# Patient Record
Sex: Female | Born: 1960 | Race: Black or African American | Hispanic: No | Marital: Married | Smoking: Never smoker
Health system: Southern US, Community
[De-identification: ages and names within clinical notes are randomized; demographics above are authoritative.]

## PROBLEM LIST (undated history)

## (undated) DIAGNOSIS — J40 Bronchitis, not specified as acute or chronic: Secondary | ICD-10-CM

## (undated) DIAGNOSIS — I10 Essential (primary) hypertension: Secondary | ICD-10-CM

## (undated) DIAGNOSIS — J449 Chronic obstructive pulmonary disease, unspecified: Secondary | ICD-10-CM

## (undated) NOTE — *Deleted (*Deleted)
Keeping You Healthy  Get These Tests  Blood Pressure- Have your blood pressure checked by your healthcare provider at least once a year.  Normal blood pressure is 120/80.  Weight- Have your body mass index (BMI) calculated to screen for obesity.  BMI is a measure of body fat based on height and weight.  You can calculate your own BMI at www.nhlbisupport.com/bmi/  Cholesterol- Have your cholesterol checked every year.  Diabetes- Have your blood sugar checked every year if you have high blood pressure, high cholesterol, a family history of diabetes or if you are overweight.  Pap Test - Have a pap test every 1 to 5 years if you have been sexually active.  If you are older than 65 and recent pap tests have been normal you may not need additional pap tests.  In addition, if you have had a hysterectomy  for benign disease additional pap tests are not necessary.  Mammogram-Yearly mammograms are essential for early detection of breast cancer  Screening for Colon Cancer- Colonoscopy starting at age 50. Screening may begin sooner depending on your family history and other health conditions.  Follow up colonoscopy as directed by your Gastroenterologist.  Screening for Osteoporosis- Screening begins at age 65 with bone density scanning, sooner if you are at higher risk for developing Osteoporosis.  Get these medicines  Calcium with Vitamin D- Your body requires 1200-1500 mg of Calcium a day and 800-1000 IU of Vitamin D a day.  You can only absorb 500 mg of Calcium at a time therefore Calcium must be taken in 2 or 3 separate doses throughout the day.  Hormones- Hormone therapy has been associated with increased risk for certain cancers and heart disease.  Talk to your healthcare provider about if you need relief from menopausal symptoms.  Aspirin- Ask your healthcare provider about taking Aspirin to prevent Heart Disease and Stroke.  Get these Immuniztions  Flu shot- Every fall  Pneumonia shot-  Once after the age of 65; if you are younger ask your healthcare provider if you need a pneumonia shot.  Tetanus- Every ten years.  Zostavax- Once after the age of 60 to prevent shingles.  Take these steps  Don't smoke- Your healthcare provider can help you quit. For tips on how to quit, ask your healthcare provider or go to www.smokefree.gov or call 1-800 QUIT-NOW.  Be physically active- Exercise 5 days a week for a minimum of 30 minutes.  If you are not already physically active, start slow and gradually work up to 30 minutes of moderate physical activity.  Try walking, dancing, bike riding, swimming, etc.  Eat a healthy diet- Eat a variety of healthy foods such as fruits, vegetables, whole grains, low fat milk, low fat cheeses, yogurt, lean meats, chicken, fish, eggs, dried beans, tofu, etc.  For more information go to www.thenutritionsource.org  Dental visit- Brush and floss teeth twice daily; visit your dentist twice a year.  Eye exam- Visit your Optometrist or Ophthalmologist yearly.  Drink alcohol in moderation- Limit alcohol intake to one drink or less a day.  Never drink and drive.  Depression- Your emotional health is as important as your physical health.  If you're feeling down or losing interest in things you normally enjoy, please talk to your healthcare provider.  Seat Belts- can save your life; always wear one  Smoke/Carbon Monoxide detectors- These detectors need to be installed on the appropriate level of your home.  Replace batteries at least once a year.  Violence- If   anyone is threatening or hurting you, please tell your healthcare provider.  Living Will/ Health care power of attorney- Discuss with your healthcare provider and family. 

---

## 1995-10-23 ENCOUNTER — Other Ambulatory Visit: Payer: Self-pay

## 2001-04-16 ENCOUNTER — Emergency Department (HOSPITAL_COMMUNITY): Admission: EM | Admit: 2001-04-16 | Discharge: 2001-04-16 | Payer: Self-pay | Admitting: Emergency Medicine

## 2002-06-04 ENCOUNTER — Emergency Department (HOSPITAL_COMMUNITY): Admission: EM | Admit: 2002-06-04 | Discharge: 2002-06-04 | Payer: Self-pay | Admitting: Emergency Medicine

## 2005-01-08 ENCOUNTER — Emergency Department (HOSPITAL_COMMUNITY): Admission: EM | Admit: 2005-01-08 | Discharge: 2005-01-08 | Payer: Self-pay | Admitting: Emergency Medicine

## 2007-02-17 ENCOUNTER — Emergency Department (HOSPITAL_COMMUNITY): Admission: EM | Admit: 2007-02-17 | Discharge: 2007-02-17 | Payer: Self-pay | Admitting: Family Medicine

## 2007-04-24 ENCOUNTER — Emergency Department (HOSPITAL_COMMUNITY): Admission: EM | Admit: 2007-04-24 | Discharge: 2007-04-24 | Payer: Self-pay | Admitting: Emergency Medicine

## 2010-12-14 LAB — URINALYSIS, ROUTINE W REFLEX MICROSCOPIC
Bilirubin Urine: NEGATIVE
Glucose, UA: NEGATIVE
Hgb urine dipstick: NEGATIVE
Ketones, ur: NEGATIVE
Nitrite: NEGATIVE
Protein, ur: NEGATIVE
Specific Gravity, Urine: 1.004 — ABNORMAL LOW
Urobilinogen, UA: 0.2
pH: 7

## 2010-12-14 LAB — BASIC METABOLIC PANEL
BUN: <1 — ABNORMAL LOW
CO2: 28
Calcium: 10.1
Chloride: 103
Creatinine, Ser: 0.48
GFR calc Af Amer: >60
GFR calc non Af Amer: >60
Glucose, Bld: 110 — ABNORMAL HIGH
Potassium: 3.1 — ABNORMAL LOW
Sodium: 138

## 2010-12-14 LAB — DIFFERENTIAL
Band Neutrophils: 0
Basophils Relative: 0
Blasts: 0
Eosinophils Relative: 2
Lymphocytes Relative: 37
Metamyelocytes Relative: 1
Monocytes Relative: 0 — ABNORMAL LOW
Myelocytes: 0
Neutrophils Relative %: 60
Promyelocytes Absolute: 0
nRBC: 0

## 2010-12-14 LAB — CBC
HCT: 32.5 — ABNORMAL LOW
Hemoglobin: 10.4 — ABNORMAL LOW
MCHC: 32
MCV: 56.6 — ABNORMAL LOW
Platelets: 187
RBC: 5.74 — ABNORMAL HIGH
RDW: 24.9 — ABNORMAL HIGH
WBC: 6.9

## 2011-01-02 LAB — POCT URINALYSIS DIP (DEVICE)
Bilirubin Urine: NEGATIVE
Glucose, UA: NEGATIVE
Hgb urine dipstick: NEGATIVE
Ketones, ur: NEGATIVE
Nitrite: POSITIVE — AB
Operator id: 235561
Protein, ur: NEGATIVE
Specific Gravity, Urine: 1.015
Urobilinogen, UA: 0.2
pH: 6.5

## 2011-01-02 LAB — POCT PREGNANCY, URINE
Operator id: 235561
Preg Test, Ur: NEGATIVE

## 2016-07-23 ENCOUNTER — Other Ambulatory Visit: Payer: Self-pay

## 2018-11-29 ENCOUNTER — Emergency Department (HOSPITAL_COMMUNITY)
Admission: EM | Admit: 2018-11-29 | Discharge: 2018-11-29 | Disposition: A | Discharge status: Home / Self Care | Payer: Self-pay | Attending: Emergency Medicine | Admitting: Emergency Medicine

## 2018-11-29 ENCOUNTER — Other Ambulatory Visit: Payer: Self-pay

## 2018-11-29 ENCOUNTER — Encounter (HOSPITAL_COMMUNITY): Payer: Self-pay | Admitting: *Deleted

## 2018-11-29 ENCOUNTER — Emergency Department (HOSPITAL_COMMUNITY): Payer: Self-pay

## 2018-11-29 ENCOUNTER — Emergency Department (HOSPITAL_COMMUNITY): Admission: EM | Admit: 2018-11-29 | Discharge: 2018-11-29 | Discharge status: Absent without leave | Payer: Self-pay

## 2018-11-29 DIAGNOSIS — R059 Cough, unspecified: Secondary | ICD-10-CM

## 2018-11-29 DIAGNOSIS — I10 Essential (primary) hypertension: Secondary | ICD-10-CM | POA: Insufficient documentation

## 2018-11-29 DIAGNOSIS — R05 Cough: Secondary | ICD-10-CM | POA: Insufficient documentation

## 2018-11-29 DIAGNOSIS — Z79899 Other long term (current) drug therapy: Secondary | ICD-10-CM | POA: Insufficient documentation

## 2018-11-29 LAB — BASIC METABOLIC PANEL
Anion gap: 11 (ref 5–15)
BUN: 10 mg/dL (ref 6–20)
CO2: 26 mmol/L (ref 22–32)
Calcium: 10.5 mg/dL — ABNORMAL HIGH (ref 8.9–10.3)
Chloride: 103 mmol/L (ref 98–111)
Creatinine, Ser: 1.01 mg/dL — ABNORMAL HIGH (ref 0.44–1.00)
GFR calc Af Amer: >60 mL/min (ref >60)
GFR calc non Af Amer: >60 mL/min (ref >60)
Glucose, Bld: 105 mg/dL — ABNORMAL HIGH (ref 70–99)
Potassium: 4 mmol/L (ref 3.5–5.1)
Sodium: 140 mmol/L (ref 135–145)

## 2018-11-29 LAB — CBC WITH DIFFERENTIAL/PLATELET
Abs Immature Granulocytes: 0.02 10*3/uL (ref 0.00–0.07)
Basophils Absolute: 0 10*3/uL (ref 0.0–0.1)
Basophils Relative: 0 %
Eosinophils Absolute: 1.1 10*3/uL — ABNORMAL HIGH (ref 0.0–0.5)
Eosinophils Relative: 19 %
HCT: 37.3 % (ref 36.0–46.0)
Hemoglobin: 11.7 g/dL — ABNORMAL LOW (ref 12.0–15.0)
Immature Granulocytes: 0 %
Lymphocytes Relative: 22 %
Lymphs Abs: 1.3 10*3/uL (ref 0.7–4.0)
MCH: 20.7 pg — ABNORMAL LOW (ref 26.0–34.0)
MCHC: 31.4 g/dL (ref 30.0–36.0)
MCV: 66 fL — ABNORMAL LOW (ref 80.0–100.0)
Monocytes Absolute: 0.3 10*3/uL (ref 0.1–1.0)
Monocytes Relative: 5 %
Neutro Abs: 3.1 10*3/uL (ref 1.7–7.7)
Neutrophils Relative %: 54 %
Platelets: 183 10*3/uL (ref 150–400)
RBC: 5.65 MIL/uL — ABNORMAL HIGH (ref 3.87–5.11)
RDW: 20.3 % — ABNORMAL HIGH (ref 11.5–15.5)
WBC: 5.8 10*3/uL (ref 4.0–10.5)
nRBC: 0 % (ref 0.0–0.2)

## 2018-11-29 LAB — TROPONIN I (HIGH SENSITIVITY): Troponin I (High Sensitivity): 2 ng/L (ref <18)

## 2018-11-29 LAB — LACTIC ACID, PLASMA: Lactic Acid, Venous: 1.2 mmol/L (ref 0.5–1.9)

## 2018-11-29 MED ORDER — LABETALOL HCL 5 MG/ML IV SOLN
20.0000 mg | Freq: Once | INTRAVENOUS | Status: AC
  Administered 2018-11-29: 20 mg via INTRAVENOUS
  Filled 2018-11-29: qty 4

## 2018-11-29 MED ORDER — HYDRALAZINE HCL 20 MG/ML IJ SOLN: 10.0000 mg | Freq: Once | INTRAMUSCULAR | Status: DC

## 2018-11-29 NOTE — ED Notes (Signed)
Attempted x2 to obtain EKG, unsuccessful. Asked 2 other staff members to assist, Precious NT said she would attempt

## 2018-11-29 NOTE — ED Notes (Signed)
An After Visit Summary was printed and given to the patient. Discharge instructions given and no further questions at this time.  

## 2018-11-29 NOTE — ED Triage Notes (Signed)
Pt reports productive cough for the past 2 months. Pt has not been seen for this. Pt denies fever or shortness of breath.

## 2018-11-29 NOTE — Discharge Instructions (Signed)
Follow-up with [facility] for your primary care needs.

## 2018-11-29 NOTE — ED Provider Notes (Signed)
[name] Provider Note   CSN: [id number] Arrival date & time: 11/29/18  0844     History   Chief Complaint Chief Complaint  Patient presents with  . Cough    HPI Nicole Cunningham is a 58 y.o. female who presents to the ED planing of persistent productive cough for the past 2 months.  Patient reports that she saw a provider sometime in July and was prescribed antibiotics as well as an albuterol inhaler without relief.  She reports she has been taking OTC cough medication since then without relief.  She presents to the ED today due to "I am tired of coughing."  Patient had not followed up with that provider after being seen in July.  No known sick contacts including no known COVID-19 positive exposures.  Denies fever, chills, shortness of breath, chest pain, leg swelling, any other associated symptoms.      History reviewed. No pertinent past medical history.  There are no active problems to display for this patient.   History reviewed. No pertinent surgical history.   OB History   No obstetric history on file.      Home Medications    Prior to Admission medications   Medication Sig Start Date End Date Taking? Authorizing Provider  gabapentin (NEURONTIN) 300 MG capsule Take 300 mg by mouth at bedtime.   Yes [provider]  mirtazapine (REMERON) 30 MG tablet Take 30 mg by mouth at bedtime.   Yes [provider]  QUEtiapine (SEROQUEL) 300 MG tablet Take 300 mg by mouth at bedtime.   Yes [provider]    Family History No family history on file.  Social History Social History   Tobacco Use  . Smoking status: Never Smoker  . Smokeless tobacco: Never Used  Substance Use Topics  . Alcohol use: Not Currently  . Drug use: Not on file     Allergies   Patient has no known allergies.   Review of Systems Review of Systems  Constitutional: Negative for chills and fever.  HENT: Negative for congestion,  ear pain and sore throat.   Eyes: Negative for visual disturbance.  Respiratory: Positive for cough. Negative for shortness of breath.   Cardiovascular: Negative for chest pain.  Gastrointestinal: Negative for abdominal pain.  Genitourinary: Negative for difficulty urinating.  Musculoskeletal: Negative for myalgias.  Skin: Negative for rash.  Neurological: Negative for headaches.     Physical Exam Updated Vital Signs BP (!) 184/122 (BP Location: Right Arm)   Pulse (!) 117   Temp (!) 97.5 F (36.4 C) (Oral)   Resp 18   SpO2 99%   Physical Exam Vitals signs and nursing note reviewed.  Constitutional:      Appearance: She is not ill-appearing.  HENT:     Head: Normocephalic and atraumatic.  Eyes:     Conjunctiva/sclera: Conjunctivae normal.  Neck:     Musculoskeletal: Neck supple.  Cardiovascular:     Rate and Rhythm: Normal rate and regular rhythm.     Pulses: Normal pulses.  Pulmonary:     Effort: Pulmonary effort is normal.     Breath sounds: Normal breath sounds. No wheezing, rhonchi or rales.     Comments: Actively coughing in room.  Able to speak in full sentences.  No accessory muscle use.  Satting 99% on room air.  Chest:     Chest wall: No tenderness.  Abdominal:     Palpations: Abdomen is soft.  Tenderness: There is no abdominal tenderness. There is no guarding or rebound.  Musculoskeletal:     Right lower leg: No edema.     Left lower leg: No edema.  Skin:    General: Skin is warm and dry.  Neurological:     Mental Status: She is alert.      ED Treatments / Results  Labs (all labs ordered are listed, but only abnormal results are displayed) Labs Reviewed  BASIC METABOLIC PANEL - Abnormal; Notable for the following components:      Result Value   Glucose, Bld 105 (*)    Creatinine, Ser 1.01 (*)    Calcium 10.5 (*)    All other components within normal limits  CBC WITH DIFFERENTIAL/PLATELET - Abnormal; Notable for the following components:    RBC 5.65 (*)    Hemoglobin 11.7 (*)    MCV 66.0 (*)    MCH 20.7 (*)    RDW 20.3 (*)    Eosinophils Absolute 1.1 (*)    All other components within normal limits  LACTIC ACID, PLASMA  TROPONIN I (HIGH SENSITIVITY)    EKG None  Radiology Dg Chest 2 View  Result Date: 11/29/2018 CLINICAL DATA:  Productive cough for 2 months.  Shortness of breath. EXAM: CHEST - 2 VIEW COMPARISON:  None. FINDINGS: The heart size and mediastinal contours are within normal limits. Both lungs are clear. The visualized skeletal structures are unremarkable. IMPRESSION: No active cardiopulmonary disease. Electronically Signed   By: Danae OrleansJohn A Stahl M.D.   On: 11/29/2018 10:09    Procedures Procedures (including critical care time)  Medications Ordered in ED Medications  labetalol (NORMODYNE) injection 20 mg (20 mg Intravenous Given 11/29/18 1024)     Initial Impression / Assessment and Plan / ED Course  I have reviewed the triage vital signs and the nursing notes.  Pertinent labs & imaging results that were available during my care of the patient were reviewed by me and considered in my medical decision making (see chart for details).    58 year old female who presents the ED complaining of a persistent cough for the past 2 months, unchanged.  Is already had antibiotics although patient cannot tell me the name of these antibiotics as well as an albuterol inhaler without relief.  No other symptoms including rhinorrhea, sore throat, postnasal drainage, ear pain, shortness of breath, chest pain.  Patient is tachycardic on arrival at 117.  She is also hypertensive at 184/122.  It appears the patient does not go to a doctor very frequently.  No history of hypertension.  Will obtain baseline screening labs as well as lactic acid today and chest x-ray.  She without any complaints of chest pain or shortness of breath.  Doubt PE today.  Will recheck blood pressure.  Patient may need to be started on antihypertensive.    Blood pressure recheck of 181/130 with tachycardia. Will give Labetalol in the ED today and check EKG and troponin. Dr. Stevie Kernykstra attending physician evaluated patient as well; agrees with plan.   EKG without ishcemic changes. Troponin 2. Do not feel pt needs repeat troponin level today. Her tachycardia has improved with the medication. CXR clear today. Remainder of bloodwork unremarkable. Will discharge patient home at this time with PCP follow up to manage bloodpressure/work up cough further. [facility]. Strict return precautions discussed. Pt is in agreement with plan and stable for discharge home.   This note was prepared using Conservation officer, historic buildingsDragon voice recognition software  and may include unintentional dictation errors due to the inherent limitations of voice recognition software.       Final Clinical Impressions(s) / ED Diagnoses   Final diagnoses:  Cough  Essential hypertension    ED Discharge Orders    None       Tanda Rockers, PA-C 11/29/18 1417    Milagros Loll, MD 11/30/18 [phone]

## 2019-01-28 ENCOUNTER — Encounter (HOSPITAL_COMMUNITY): Payer: Self-pay | Admitting: *Deleted

## 2019-01-28 ENCOUNTER — Other Ambulatory Visit: Payer: Self-pay

## 2019-01-28 ENCOUNTER — Emergency Department (HOSPITAL_COMMUNITY): Payer: Self-pay

## 2019-01-28 ENCOUNTER — Emergency Department (HOSPITAL_COMMUNITY)
Admission: EM | Admit: 2019-01-28 | Discharge: 2019-01-28 | Disposition: A | Discharge status: Home / Self Care | Payer: Self-pay | Attending: Emergency Medicine | Admitting: Emergency Medicine

## 2019-01-28 DIAGNOSIS — R0602 Shortness of breath: Secondary | ICD-10-CM | POA: Insufficient documentation

## 2019-01-28 DIAGNOSIS — J40 Bronchitis, not specified as acute or chronic: Secondary | ICD-10-CM | POA: Insufficient documentation

## 2019-01-28 DIAGNOSIS — Z79899 Other long term (current) drug therapy: Secondary | ICD-10-CM | POA: Insufficient documentation

## 2019-01-28 MED ORDER — ALBUTEROL SULFATE HFA 108 (90 BASE) MCG/ACT IN AERS
2.0000 | INHALATION_SPRAY | Freq: Once | RESPIRATORY_TRACT | Status: AC
  Administered 2019-01-28: 2 via RESPIRATORY_TRACT
  Filled 2019-01-28: qty 6.7

## 2019-01-28 MED ORDER — PREDNISONE 10 MG PO TABS: ORAL_TABLET | ORAL | 0 refills | Status: DC

## 2019-01-28 MED ORDER — PREDNISONE 20 MG PO TABS
60.0000 mg | ORAL_TABLET | Freq: Every day | ORAL | Status: DC
  Administered 2019-01-28: 60 mg via ORAL
  Filled 2019-01-28: qty 3

## 2019-01-28 NOTE — ED Provider Notes (Signed)
[facility] DEPT Provider Note   CSN: [id number] Arrival date & time: 01/28/19  1227     History   Chief Complaint Chief Complaint  Patient presents with  . Cough  . Shortness of Breath    HPI Nicole Cunningham is a 58 y.o. female.     The history is provided by the patient.  Cough Cough characteristics:  Non-productive Sputum characteristics:  Nondescript Severity:  Moderate Onset quality:  Gradual Duration:  3 days Timing:  Constant Progression:  Worsening Chronicity:  New Relieved by:  Nothing Worsened by:  Nothing Ineffective treatments:  None tried Associated symptoms: shortness of breath   Shortness of Breath Associated symptoms: cough     History reviewed. No pertinent past medical history.  There are no active problems to display for this patient.   History reviewed. No pertinent surgical history.   OB History   No obstetric history on file.      Home Medications    Prior to Admission medications   Medication Sig Start Date End Date Taking? Authorizing Provider  gabapentin (NEURONTIN) 300 MG capsule Take 300 mg by mouth at bedtime.    [provider]  mirtazapine (REMERON) 30 MG tablet Take 30 mg by mouth at bedtime.    [provider]  predniSONE (DELTASONE) 10 MG tablet 5,4,3,2,1 taper 01/28/19   Caryl Ada K, PA-C  QUEtiapine (SEROQUEL) 300 MG tablet Take 300 mg by mouth at bedtime.    [provider]    Family History No family history on file.  Social History Social History   Tobacco Use  . Smoking status: Never Smoker  . Smokeless tobacco: Never Used  Substance Use Topics  . Alcohol use: Not Currently  . Drug use: Never     Allergies   Patient has no known allergies.   Review of Systems Review of Systems  Respiratory: Positive for cough and shortness of breath.   All other systems reviewed and are negative.    Physical Exam Updated Vital Signs BP (!) 141/70    Pulse 88   Temp 98.3 F (36.8 C) (Oral)   Resp 18   Ht 5\' 4"  (1.626 m)   SpO2 99%   Physical Exam Vitals signs and nursing note reviewed.  Constitutional:      Appearance: She is well-developed.  HENT:     Head: Normocephalic.     Mouth/Throat:     Mouth: Mucous membranes are moist.  Neck:     Musculoskeletal: Normal range of motion.  Cardiovascular:     Rate and Rhythm: Normal rate and regular rhythm.  Pulmonary:     Effort: Pulmonary effort is normal.     Breath sounds: No decreased breath sounds.  Abdominal:     General: There is no distension.  Musculoskeletal: Normal range of motion.  Skin:    General: Skin is warm.  Neurological:     General: No focal deficit present.     Mental Status: She is alert and oriented to person, place, and time.  Psychiatric:        Mood and Affect: Mood normal.      ED Treatments / Results  Labs (all labs ordered are listed, but only abnormal results are displayed) Labs Reviewed - No data to display  EKG None  Radiology Dg Chest [facility] 1 View  Result Date: 01/28/2019 CLINICAL DATA:  Cough EXAM: PORTABLE CHEST 1 VIEW COMPARISON:  None. FINDINGS: The heart size and mediastinal contours are  within normal limits. Both lungs are clear. The visualized skeletal structures are unremarkable. Mild scarring at the right apex. IMPRESSION: No active disease. Electronically Signed   By: Jasmine Pang M.D.   On: 01/28/2019 18:46    Procedures Procedures (including critical care time)  Medications Ordered in ED Medications  predniSONE (DELTASONE) tablet 60 mg (60 mg Oral Given 01/28/19 1857)  albuterol (VENTOLIN HFA) 108 (90 Base) MCG/ACT inhaler 2 puff (2 puffs Inhalation Given 01/28/19 1624)     Initial Impression / Assessment and Plan / ED Course  I have reviewed the triage vital signs and the nursing notes.  Pertinent labs & imaging results that were available during my care of the patient were reviewed by me and considered in my  medical decision making (see chart for details).        MDM  Chest xray no acute abnormality   Pt has no covid risk currently.  Pt reports she has had multiple episodes of bronchitis in the past.  Pt given inhaler here.  Prednisone and rx.   Final Clinical Impressions(s) / ED Diagnoses   Final diagnoses:  Bronchitis    ED Discharge Orders         Ordered    predniSONE (DELTASONE) 10 MG tablet     01/28/19 1852        An After Visit Summary was printed and given to the patient.    Osie Cheeks 01/28/19 2115    Milagros Loll, MD 01/29/19 0001

## 2019-01-28 NOTE — Discharge Instructions (Addendum)
Return if any problems.

## 2019-01-28 NOTE — ED Notes (Addendum)
An After Visit Summary was printed and given to the patient. Discharge instructions given and no further questions at this time. Pt denies SOB at this time. Pt states family is taking her home.

## 2019-01-28 NOTE — ED Triage Notes (Signed)
Pt reports SOB and coughing for a few months.  Pt states that she has been seen for similar issue. Pt reports that when she coughs, she feels SOB. Pt is coughing up "cream colored" mucus. Pt states she had a negative COVID test the first time she was seen for her symptoms. Pt states she has not been around anyone since getting her COVID test.

## 2019-02-17 ENCOUNTER — Ambulatory Visit (HOSPITAL_COMMUNITY)
Admission: EM | Admit: 2019-02-17 | Discharge: 2019-02-17 | Disposition: A | Discharge status: Home / Self Care | Payer: Self-pay | Attending: Family Medicine | Admitting: Family Medicine

## 2019-02-17 ENCOUNTER — Encounter (HOSPITAL_COMMUNITY): Payer: Self-pay

## 2019-02-17 ENCOUNTER — Other Ambulatory Visit: Payer: Self-pay

## 2019-02-17 DIAGNOSIS — J22 Unspecified acute lower respiratory infection: Secondary | ICD-10-CM

## 2019-02-17 HISTORY — DX: Bronchitis, not specified as acute or chronic: J40

## 2019-02-17 HISTORY — DX: Essential (primary) hypertension: I10

## 2019-02-17 MED ORDER — PREDNISONE 20 MG PO TABS: 20.0000 mg | ORAL_TABLET | Freq: Two times a day (BID) | ORAL | 0 refills | Status: DC

## 2019-02-17 MED ORDER — BENZONATATE 200 MG PO CAPS: 200.0000 mg | ORAL_CAPSULE | Freq: Two times a day (BID) | ORAL | 0 refills | Status: DC | PRN

## 2019-02-17 MED ORDER — AZITHROMYCIN 250 MG PO TABS: ORAL_TABLET | ORAL | 0 refills | Status: DC

## 2019-02-17 NOTE — Discharge Instructions (Signed)
REST PUSH FLUIDS RUN A HUMIDIFIER IN YOUR ROOM IF YOU HAVE ONE TAKE MEDICATIONS AS PRESCRIBED SEE YOUR PCP IF NOT BETTER IN A WEEK OR TWO

## 2019-02-17 NOTE — ED Triage Notes (Signed)
Pt states she has bronchitis. Pt states she was told that at the ER.on 01/28/2019. Pt states she has taken all of her meds and she still has a cough. X 1 week now.Pt states she was tested for Covid. Pt results were negative.

## 2019-02-17 NOTE — ED Provider Notes (Signed)
[facility]    CSN: [id number] Arrival date & time: 02/17/19  4098      History   Chief Complaint Chief Complaint  Patient presents with  . Cough    HPI Nicole Cunningham is a 58 y.o. female.   HPI  Patient is here for follow-up for bronchitis.  She was seen On 01/28/2019 in the emergency department.  Diagnosed with acute bronchitis.  Treated with steroids and an albuterol inhaler. Patient has no underlying lung disease asthma or COPD. Patient has never been a smoker. She had coronavirus testing which was negative. She states that she is continuing to cough.  She is coughing up a lot of sputum.  She also has a lot of "mucus in my nose".  The sputum is turning yellow.  Her chest is "tired" from all the coughing.  No shortness of breath.  No body aches.  No loss of appetite, taste, or smell.  No exposure to illness.  No prior history of comes with infection such as bronchitis.  No notes in the household is sick.  Patient works from home  Past Medical History:  Diagnosis Date  . Bronchitis   . Hypertension     There are no active problems to display for this patient.   History reviewed. No pertinent surgical history.  OB History   No obstetric history on file.      Home Medications    Prior to Admission medications   Medication Sig Start Date End Date Taking? Authorizing Provider  azithromycin (ZITHROMAX Z-PAK) 250 MG tablet Take two pills today followed by one a day until gone 02/17/19   Eustace Moore, MD  benzonatate (TESSALON) 200 MG capsule Take 1 capsule (200 mg total) by mouth 2 (two) times daily as needed for cough. 02/17/19   Eustace Moore, MD  gabapentin (NEURONTIN) 300 MG capsule Take 300 mg by mouth at bedtime.    [provider]  mirtazapine (REMERON) 30 MG tablet Take 30 mg by mouth at bedtime.    [provider]  predniSONE (DELTASONE) 20 MG tablet Take 1 tablet (20 mg total) by mouth 2 (two) times daily with a meal.  02/17/19   Eustace Moore, MD  QUEtiapine (SEROQUEL) 300 MG tablet Take 300 mg by mouth at bedtime.    [provider]    Family History Family History  Problem Relation Age of Onset  . Diabetes Mother   . Diabetes Father     Social History Social History   Tobacco Use  . Smoking status: Never Smoker  . Smokeless tobacco: Never Used  Substance Use Topics  . Alcohol use: Not Currently  . Drug use: Never     Allergies   Patient has no known allergies.   Review of Systems Review of Systems  Constitutional: Positive for fatigue. Negative for chills and fever.  HENT: Positive for congestion, postnasal drip, rhinorrhea and sinus pressure. Negative for ear pain and sore throat.   Eyes: Negative for pain and visual disturbance.  Respiratory: Positive for cough. Negative for shortness of breath.   Cardiovascular: Negative for chest pain and palpitations.  Gastrointestinal: Negative for abdominal pain and vomiting.  Genitourinary: Negative for dysuria and hematuria.  Musculoskeletal: Negative for arthralgias and back pain.  Skin: Negative for color change and rash.  Neurological: Negative for seizures and syncope.  All other systems reviewed and are negative.    Physical Exam Triage Vital Signs ED Triage Vitals  Enc Vitals Group  BP 02/17/19 0959 124/82     Pulse Rate 02/17/19 0959 100     Resp 02/17/19 0959 16     Temp 02/17/19 0959 98.4 F (36.9 C)     Temp Source 02/17/19 0959 Oral     SpO2 02/17/19 0959 96     Weight 02/17/19 1001 140 lb (63.5 kg)     Height --      Head Circumference --      Peak Flow --      Pain Score 02/17/19 1000 2     Pain Loc --      Pain Edu? --      Excl. in GC? --    No data found.  Updated Vital Signs BP 124/82 (BP Location: Right Arm)   Pulse 100   Temp 98.4 F (36.9 C) (Oral)   Resp 16   Wt 63.5 kg   SpO2 (!) 16%   BMI 24.03 kg/m     Physical Exam Constitutional:      General: She is not in acute  distress.    Appearance: She is well-developed and normal weight.  HENT:     Head: Normocephalic and atraumatic.     Nose: Congestion and rhinorrhea present.     Mouth/Throat:     Mouth: Mucous membranes are moist.     Pharynx: No posterior oropharyngeal erythema.  Eyes:     Conjunctiva/sclera: Conjunctivae normal.     Pupils: Pupils are equal, round, and reactive to light.  Neck:     Musculoskeletal: Normal range of motion.  Cardiovascular:     Rate and Rhythm: Normal rate and regular rhythm.  Pulmonary:     Effort: Pulmonary effort is normal. No respiratory distress.     Breath sounds: Rhonchi present. No wheezing or rales.  Abdominal:     General: There is no distension.     Palpations: Abdomen is soft.  Musculoskeletal: Normal range of motion.  Skin:    General: Skin is warm and dry.  Neurological:     Mental Status: She is alert.  Psychiatric:        Mood and Affect: Mood normal.        Behavior: Behavior normal.      UC Treatments / Results  Labs (all labs ordered are listed, but only abnormal results are displayed) Labs Reviewed - No data to display  EKG   Radiology No results found.  Procedures Procedures (including critical care time)  Medications Ordered in UC Medications - No data to display  Initial Impression / Assessment and Plan / UC Course  I have reviewed the triage vital signs and the nursing notes.  Pertinent labs & imaging results that were available during my care of the patient were reviewed by me and considered in my medical decision making (see chart for details).     I discussed with the patient that acute bronchitis is almost always caused by a virus.  The persistent cough can be from viral inflammation.  However, with the change in symptoms and increased productive sputum, I feel like a Z-Pak is reasonable.  We will repeat her steroids.  She already has an inhaler.  I am giving her Tessalon for the cough.  She needs to push fluids  and use humidifier if she has 1.  Follow-up with the PCP as recommended Final Clinical Impressions(s) / UC Diagnoses   Final diagnoses:  Lower respiratory tract infection     Discharge Instructions     REST  PUSH FLUIDS RUN A HUMIDIFIER IN YOUR ROOM IF YOU HAVE ONE TAKE MEDICATIONS AS PRESCRIBED SEE YOUR PCP IF NOT BETTER IN A WEEK OR TWO    ED Prescriptions    Medication Sig Dispense Auth. Provider   benzonatate (TESSALON) 200 MG capsule Take 1 capsule (200 mg total) by mouth 2 (two) times daily as needed for cough. 20 capsule Raylene Everts, MD   predniSONE (DELTASONE) 20 MG tablet Take 1 tablet (20 mg total) by mouth 2 (two) times daily with a meal. 10 tablet Raylene Everts, MD   azithromycin (ZITHROMAX Z-PAK) 250 MG tablet Take two pills today followed by one a day until gone 6 tablet Meda Coffee Jennette Banker, MD     PDMP not reviewed this encounter.   Raylene Everts, MD 02/17/19 1057

## 2019-03-21 ENCOUNTER — Other Ambulatory Visit: Payer: Self-pay

## 2019-03-21 ENCOUNTER — Encounter (HOSPITAL_COMMUNITY): Payer: Self-pay | Admitting: *Deleted

## 2019-03-21 ENCOUNTER — Ambulatory Visit (INDEPENDENT_AMBULATORY_CARE_PROVIDER_SITE_OTHER): Payer: Self-pay

## 2019-03-21 ENCOUNTER — Ambulatory Visit (HOSPITAL_COMMUNITY)
Admission: EM | Admit: 2019-03-21 | Discharge: 2019-03-21 | Disposition: A | Discharge status: Home / Self Care | Payer: Self-pay

## 2019-03-21 DIAGNOSIS — R000 Tachycardia, unspecified: Secondary | ICD-10-CM

## 2019-03-21 DIAGNOSIS — R0602 Shortness of breath: Secondary | ICD-10-CM

## 2019-03-21 DIAGNOSIS — R059 Cough, unspecified: Secondary | ICD-10-CM

## 2019-03-21 DIAGNOSIS — R0789 Other chest pain: Secondary | ICD-10-CM

## 2019-03-21 DIAGNOSIS — J40 Bronchitis, not specified as acute or chronic: Secondary | ICD-10-CM

## 2019-03-21 DIAGNOSIS — R05 Cough: Secondary | ICD-10-CM

## 2019-03-21 LAB — POC SARS CORONAVIRUS 2 AG -  ED: SARS Coronavirus 2 Ag: NEGATIVE

## 2019-03-21 LAB — POC SARS CORONAVIRUS 2 AG: SARS Coronavirus 2 Ag: NEGATIVE

## 2019-03-21 MED ORDER — METHYLPREDNISOLONE SODIUM SUCC 125 MG IJ SOLR
125.0000 mg | Freq: Once | INTRAMUSCULAR | Status: AC
  Administered 2019-03-21: 125 mg via INTRAMUSCULAR

## 2019-03-21 MED ORDER — METHYLPREDNISOLONE SODIUM SUCC 125 MG IJ SOLR
INTRAMUSCULAR | Status: AC
  Filled 2019-03-21: qty 2

## 2019-03-21 MED ORDER — HYDROCODONE-HOMATROPINE 5-1.5 MG/5ML PO SYRP: 5.0000 mL | ORAL_SOLUTION | Freq: Every evening | ORAL | 0 refills | Status: DC | PRN

## 2019-03-21 MED ORDER — ALBUTEROL SULFATE HFA 108 (90 BASE) MCG/ACT IN AERS: 1.0000 | INHALATION_SPRAY | Freq: Four times a day (QID) | RESPIRATORY_TRACT | 0 refills | Status: DC | PRN

## 2019-03-21 NOTE — ED Provider Notes (Addendum)
[facility]   MRN: [id number] DOB: 1961-02-17  Subjective:   Nicole Cunningham is a 58 y.o. female presenting for 1 month hx of productive cough. Sx have persisted despite undergoing 2 rounds of steroids, azithromycin. She has ongoing productive hacking cough, coughing fits that elicit chest pain, shob and occasional post-tussive emesis. Has dyspnea. Reports that she gets bronchitis. Has a hx of HTN and reports compliance with these medications.  Denies smoking cigarettes, COPD.  No current facility-administered medications for this encounter.  Current Outpatient Medications:  .  benzonatate (TESSALON) 200 MG capsule, Take 1 capsule (200 mg total) by mouth 2 (two) times daily as needed for cough., Disp: 20 capsule, Rfl: 0 .  FLUoxetine HCl (PROZAC PO), Take by mouth., Disp: , Rfl:  .  gabapentin (NEURONTIN) 300 MG capsule, Take 300 mg by mouth at bedtime., Disp: , Rfl:  .  QUEtiapine (SEROQUEL) 300 MG tablet, Take 300 mg by mouth at bedtime., Disp: , Rfl:  .  UNKNOWN TO PATIENT, One HTN med; unk name, Disp: , Rfl:  .  azithromycin (ZITHROMAX Z-PAK) 250 MG tablet, Take two pills today followed by one a day until gone, Disp: 6 tablet, Rfl: 0 .  mirtazapine (REMERON) 30 MG tablet, Take 30 mg by mouth at bedtime., Disp: , Rfl:  .  predniSONE (DELTASONE) 20 MG tablet, Take 1 tablet (20 mg total) by mouth 2 (two) times daily with a meal., Disp: 10 tablet, Rfl: 0   No Known Allergies  Past Medical History:  Diagnosis Date  . Bronchitis   . Hypertension      History reviewed. No pertinent surgical history.  Family History  Problem Relation Age of Onset  . Diabetes Mother   . Diabetes Father     Social History   Tobacco Use  . Smoking status: Never Smoker  . Smokeless tobacco: Never Used  Substance Use Topics  . Alcohol use: Not Currently  . Drug use: Never    ROS   Objective:   Vitals: BP (!) 154/96 Comment: took HTN med approx 1 hr ago  Pulse (!) 112   Temp  97.9 F (36.6 C) (Oral)   Resp 18   SpO2 100%   Physical Exam Constitutional:      General: She is not in acute distress.    Appearance: Normal appearance. She is well-developed. She is not ill-appearing, toxic-appearing or diaphoretic.  HENT:     Head: Normocephalic and atraumatic.     Nose: Nose normal.     Mouth/Throat:     Mouth: Mucous membranes are moist.  Eyes:     Extraocular Movements: Extraocular movements intact.     Pupils: Pupils are equal, round, and reactive to light.  Cardiovascular:     Rate and Rhythm: Normal rate and regular rhythm.     Pulses: Normal pulses.     Heart sounds: Normal heart sounds. No murmur. No friction rub. No gallop.   Pulmonary:     Effort: Pulmonary effort is normal. No respiratory distress.     Breath sounds: Normal breath sounds. No stridor. No wheezing, rhonchi or rales.  Skin:    General: Skin is warm and dry.     Findings: No rash.  Neurological:     Mental Status: She is alert and oriented to person, place, and time.  Psychiatric:        Mood and Affect: Mood normal.        Behavior: Behavior normal.  Thought Content: Thought content normal.    ED ECG REPORT   Date: 03/21/2019  Rate: 98 bpm  Rhythm: Sinus rhythm  QRS Axis: normal  Intervals: normal  ST/T Wave abnormalities: normal  Conduction Disutrbances:none  Narrative Interpretation: Sinus rhythm at 98 bpm, largely unchanged from EKG from 12/01/2018  Old EKG Reviewed: unchanged  I have personally reviewed the EKG tracing and agree with the computerized printout as noted.   DG Chest 2 View  Result Date: 03/21/2019 CLINICAL DATA:  Cough and congestion for 1 month. EXAM: CHEST - 2 VIEW COMPARISON:  January 28, 2019 FINDINGS: The heart size and mediastinal contours are within normal limits. Biapical pleural thickening is identified unchanged. Both lungs are otherwise clear. The visualized skeletal structures are unremarkable. IMPRESSION: No active cardiopulmonary  disease. Electronically Signed   By: Abelardo Diesel M.D.   On: 03/21/2019 11:11   Pulse oximetry remained at 100% when challenged by walking in the clinic.  Pulse did increase to 125 and patient felt mild increase in shortness of breath.  Results for orders placed or performed during the hospital encounter of 03/21/19 (from the past 24 hour(s))  POC SARS Coronavirus 2 Ag-ED - Nasal Swab (BD Veritor Kit)     Status: None   Collection Time: 03/21/19 11:11 AM  Result Value Ref Range   SARS Coronavirus 2 Ag NEGATIVE NEGATIVE  POC SARS Coronavirus 2 Ag     Status: None   Collection Time: 03/21/19 11:11 AM  Result Value Ref Range   SARS Coronavirus 2 Ag NEGATIVE NEGATIVE    Assessment and Plan :   1. Bronchitis   2. Cough   3. Atypical chest pain   4. Tachycardia   5. Shortness of breath     Patient has low risk factors for chest PE, no recent travel, hospitalizations, cancer, clotting disorder but emphasized that if sx persist will need r/o of chest PE in ER through chest CTA. Pt verbalizes understanding. IM Solu-Medrol in clinic today, start albuterol inhaler at home.  We will have patient take hydrocodone cough syrup at home at bedtime.  Patient is to use [employer] and over-the-counter supportive care.  Please establish care with a provider through [facility] internal medicine for follow-up ASAP. Counseled patient on potential for adverse effects with medications prescribed/recommended today, ER and return-to-clinic precautions discussed, patient verbalized understanding.    Jaynee Eagles, PA-C 03/21/19 1140

## 2019-03-21 NOTE — ED Triage Notes (Signed)
Reports cough, runny nose and SOB x > 1 month; was seen 02/17/19 and placed on prednisone and Z-Pak; pt states never had any improvement.  C/O vomiting following coughing fits. States feels like cough is getting worse, and continues with SOB when walking.  Denies fevers.

## 2019-03-21 NOTE — Discharge Instructions (Addendum)
For sore throat try using a honey-based tea. Use 3 teaspoons of honey with juice squeezed from half lemon. Place shaved pieces of ginger into 1/2-1 cup of water and warm over stove top. Then mix the ingredients and repeat every 4 hours as needed. 

## 2019-04-22 ENCOUNTER — Encounter (HOSPITAL_COMMUNITY): Payer: Self-pay | Admitting: Emergency Medicine

## 2019-04-22 ENCOUNTER — Ambulatory Visit (HOSPITAL_COMMUNITY)
Admission: EM | Admit: 2019-04-22 | Discharge: 2019-04-22 | Disposition: A | Discharge status: Home / Self Care | Payer: Self-pay | Attending: Family Medicine | Admitting: Family Medicine

## 2019-04-22 ENCOUNTER — Ambulatory Visit (INDEPENDENT_AMBULATORY_CARE_PROVIDER_SITE_OTHER): Payer: Self-pay

## 2019-04-22 ENCOUNTER — Other Ambulatory Visit: Payer: Self-pay

## 2019-04-22 DIAGNOSIS — R0602 Shortness of breath: Secondary | ICD-10-CM

## 2019-04-22 DIAGNOSIS — I10 Essential (primary) hypertension: Secondary | ICD-10-CM

## 2019-04-22 DIAGNOSIS — R059 Cough, unspecified: Secondary | ICD-10-CM

## 2019-04-22 DIAGNOSIS — R000 Tachycardia, unspecified: Secondary | ICD-10-CM

## 2019-04-22 DIAGNOSIS — R05 Cough: Secondary | ICD-10-CM

## 2019-04-22 MED ORDER — PREDNISONE 10 MG (48) PO TBPK: ORAL_TABLET | ORAL | 0 refills | Status: DC

## 2019-04-22 MED ORDER — ALBUTEROL SULFATE HFA 108 (90 BASE) MCG/ACT IN AERS: 1.0000 | INHALATION_SPRAY | Freq: Four times a day (QID) | RESPIRATORY_TRACT | 1 refills | Status: DC | PRN

## 2019-04-22 NOTE — ED Triage Notes (Signed)
Negative for COVID 1 month ago, was given an albuterol inhaler at that visit. Ran out of albuterol yesterday. Woke up this AM with shortness of breath.

## 2019-04-25 NOTE — ED Provider Notes (Signed)
Nicole Cunningham   [id number] 04/22/19 Arrival Time: 7782  ASSESSMENT & PLAN:  1. Cough   2. Shortness of breath   3. Tachycardia   4. Elevated blood pressure reading with diagnosis of hypertension      I have personally viewed the imaging studies ordered this visit. No sign of PNA. Reassured.  Begin: Meds ordered this encounter  Medications  . albuterol (VENTOLIN HFA) 108 (90 Base) MCG/ACT inhaler    Sig: Inhale 1-2 puffs into the lungs every 6 (six) hours as needed for wheezing or shortness of breath.    Dispense:  18 g    Refill:  1  . predniSONE (STERAPRED UNI-PAK 48 TAB) 10 MG (48) TBPK tablet    Sig: Take as directed.    Dispense:  48 tablet    Refill:  0      Follow-up Information    Schedule an appointment as soon as possible for a visit  with Primary Care at [facility].   Specialty: Family Medicine Contact information: [address] [place] [phone]          Reviewed expectations re: course of current medical issues. Questions answered. Outlined signs and symptoms indicating need for more acute intervention. Patient verbalized understanding. After Visit Summary given.   SUBJECTIVE: History from: patient. Nicole Cunningham is a 59 y.o. female who reports waking this am with wheezing. H/O asthma. Out of albuterol. SOB when wheezing. Occas dry cough. Afebrile. Normal PO intake with n/v.  Increased blood pressure noted today. Reports that she is not treated for HTN. She reports no chest pain on exertion, no swelling of ankles, no orthostatic dizziness or lightheadedness, no orthopnea or paroxysmal nocturnal dyspnea, no palpitations and no intermittent claudication symptoms.     OBJECTIVE:  Vitals:   04/22/19 1647  BP: (!) 159/93  Pulse: (!) 124  Resp: 18  Temp: 98.8 F (37.1 C)  TempSrc: Oral  SpO2: 95%    General appearance: alert; no distress Eyes: PERRLA; EOMI; conjunctiva  normal HENT: [place]; AT; nasal mucosa normal; oral mucosa normal Neck: supple  CV: regular; tachycardic (has been tachy on past visits also) Lungs: speaks full sentences without difficulty; unlabored; CTAB Extremities: no edema Skin: warm and dry Neurologic: normal gait Psychological: alert and cooperative; normal mood and affect   Imaging: DG Chest 2 View  Result Date: 04/22/2019 CLINICAL DATA:  59 year old female with shortness of breath and cough for 2 months. EXAM: CHEST - 2 VIEW COMPARISON:  Chest radiographs 03/21/2019 and earlier. FINDINGS: Lung volumes and mediastinal contours remain normal. Visualized tracheal air column is within normal limits. There is stable apical scarring greater on the right. No pneumothorax, pulmonary edema, pleural effusion or confluent pulmonary opacity. Osteopenia. No acute osseous abnormality identified. Negative visible bowel gas pattern. IMPRESSION: Chronic apical lung scarring.  No acute cardiopulmonary abnormality. Electronically Signed   By: Genevie Ann M.D.   On: 04/22/2019 17:08    No Known Allergies  Past Medical History:  Diagnosis Date  . Bronchitis   . Hypertension    Social History   Socioeconomic History  . Marital status: Married    Spouse name: Not on file  . Number of children: Not on file  . Years of education: Not on file  . Highest education level: Not on file  Occupational History  . Not on file  Tobacco Use  . Smoking status: Never Smoker  . Smokeless tobacco: Never Used  Substance and Sexual  Activity  . Alcohol use: Not Currently  . Drug use: Never  . Sexual activity: Not on file  Other Topics Concern  . Not on file  Social History Narrative  . Not on file   Social Determinants of Health   Financial Resource Strain:   . Difficulty of Paying Living Expenses: Not on file  Food Insecurity:   . Worried About Programme researcher, broadcasting/film/video in the Last Year: Not on file  . Ran Out of Food in the Last Year: Not on file   Transportation Needs:   . Lack of Transportation (Medical): Not on file  . Lack of Transportation (Non-Medical): Not on file  Physical Activity:   . Days of Exercise per Week: Not on file  . Minutes of Exercise per Session: Not on file  Stress:   . Feeling of Stress : Not on file  Social Connections:   . Frequency of Communication with Friends and Family: Not on file  . Frequency of Social Gatherings with Friends and Family: Not on file  . Attends Religious Services: Not on file  . Active Member of Clubs or Organizations: Not on file  . Attends Banker Meetings: Not on file  . Marital Status: Not on file  Intimate Partner Violence:   . Fear of Current or Ex-Partner: Not on file  . Emotionally Abused: Not on file  . Physically Abused: Not on file  . Sexually Abused: Not on file   Family History  Problem Relation Age of Onset  . Diabetes Mother   . Diabetes Father    History reviewed. No pertinent surgical history.   Mardella Layman, MD 04/25/19 [phone]

## 2019-05-22 ENCOUNTER — Ambulatory Visit (HOSPITAL_COMMUNITY)
Admission: EM | Admit: 2019-05-22 | Discharge: 2019-05-22 | Disposition: A | Discharge status: Home / Self Care | Payer: Self-pay | Attending: Physician Assistant | Admitting: Physician Assistant

## 2019-05-22 ENCOUNTER — Other Ambulatory Visit: Payer: Self-pay

## 2019-05-22 ENCOUNTER — Encounter (HOSPITAL_COMMUNITY): Payer: Self-pay

## 2019-05-22 DIAGNOSIS — R059 Cough, unspecified: Secondary | ICD-10-CM

## 2019-05-22 DIAGNOSIS — R05 Cough: Secondary | ICD-10-CM | POA: Insufficient documentation

## 2019-05-22 DIAGNOSIS — Z833 Family history of diabetes mellitus: Secondary | ICD-10-CM | POA: Insufficient documentation

## 2019-05-22 DIAGNOSIS — Z20822 Contact with and (suspected) exposure to covid-19: Secondary | ICD-10-CM | POA: Insufficient documentation

## 2019-05-22 DIAGNOSIS — R0602 Shortness of breath: Secondary | ICD-10-CM | POA: Insufficient documentation

## 2019-05-22 MED ORDER — PREDNISONE 10 MG PO TABS: 40.0000 mg | ORAL_TABLET | Freq: Every day | ORAL | 0 refills | Status: AC

## 2019-05-22 MED ORDER — ALBUTEROL SULFATE HFA 108 (90 BASE) MCG/ACT IN AERS: 1.0000 | INHALATION_SPRAY | Freq: Four times a day (QID) | RESPIRATORY_TRACT | 1 refills | Status: DC | PRN

## 2019-05-22 MED ORDER — COMBIVENT RESPIMAT 20-100 MCG/ACT IN AERS: 1.0000 | INHALATION_SPRAY | Freq: Four times a day (QID) | RESPIRATORY_TRACT | 1 refills | Status: DC

## 2019-05-22 MED ORDER — BENZONATATE 100 MG PO CAPS: 100.0000 mg | ORAL_CAPSULE | Freq: Three times a day (TID) | ORAL | 0 refills | Status: DC

## 2019-05-22 NOTE — ED Triage Notes (Signed)
Pt is here with SOB that's started last night, states she is out of her inhaler.

## 2019-05-22 NOTE — ED Provider Notes (Addendum)
[facility]    CSN: [id number] Arrival date & time: 05/22/19  0848      History   Chief Complaint Chief Complaint  Patient presents with  . Shortness of Breath    HPI Nicole Cunningham is a 59 y.o. female.   Patient presents to urgent care today for recent shortness of breath  and dry cough.  She reports a history of having similar episodes but not have a formal diagnosis of asthma or COPD.  She reports frequent use of her albuterol inhaler over the previous 1 month such that she has run out but cannot recall when she ran out.  She reports her shortness of breath felt worse last night.  She does report that her shortness of breath is worse with exertion, specifically when discussing this exertion she reports there is a hill with a steep incline that has become difficult for her to climb.  She cannot qualify or quantify the length of time but this has been progressing.  Also reporting chest pain that is worse with cough and deep breaths.  The pain is described to be over the middle of her chest and sharp at times.  She also reports back pain with her cough.  She does report that her cough has been a little more frequent recently however can also not describe the time..  She denies fever, chills, productive cough, denies nausea, vomiting, diarrhea.  Denies lower extremity swelling or pain.  Does endorse some nasal congestion and history of allergies.  He denies recent sick contacts and has been isolating due to Covid.  Overall this patient is a fairly poor historian and cannot give specifics about her symptoms.  She reports utilizing her " as described on the box".  However she cannot remember when she ran out of the inhaler and was seen on 04/22/2019 prescribed a new albuterol inhaler.  She denies a history of smoking, is not a current smoker.     Past Medical History:  Diagnosis Date  . Bronchitis   . Hypertension     There are no problems to display for this  patient.   History reviewed. No pertinent surgical history.  OB History   No obstetric history on file.      Home Medications    Prior to Admission medications   Medication Sig Start Date End Date Taking? Authorizing Provider  albuterol (VENTOLIN HFA) 108 (90 Base) MCG/ACT inhaler Inhale 1-2 puffs into the lungs every 6 (six) hours as needed for wheezing or shortness of breath. 05/22/19   Alexcia Schools, Veryl Speak, PA-C  benzonatate (TESSALON) 100 MG capsule Take 1 capsule (100 mg total) by mouth every 8 (eight) hours. 05/22/19   Amee Boothe, Veryl Speak, PA-C  doxycycline (VIBRAMYCIN) 100 MG capsule doxycycline hyclate 100 mg capsule  Take 1 capsule twice a day by oral route with meals.    [provider]  FLUoxetine (PROZAC) 20 MG capsule fluoxetine 20 mg capsule  Take 1 capsule every day by oral route.    [provider]  FLUoxetine HCl (PROZAC PO) Take by mouth.    [provider]  Gabapentin 10 % CREA gabapentin  400mg     [provider]  mirtazapine (REMERON) 30 MG tablet mirtazapine 30 mg tablet  Take 1 tablet every day by oral route.    [provider]  predniSONE (DELTASONE) 10 MG tablet Take 4 tablets (40 mg total) by mouth daily for 5 days. 05/22/19 05/27/19  Kimmie Berggren, 07/27/19, PA-C  QUEtiapine (SEROQUEL) 300 MG tablet Take 300 mg by mouth at bedtime.    [provider]  QUEtiapine (SEROQUEL) 300 MG tablet quetiapine 300 mg tablet  Take 1 tablet every day by oral route.    [provider]  UNKNOWN TO PATIENT One HTN med; unk name    [provider]  gabapentin (NEURONTIN) 300 MG capsule Take 300 mg by mouth at bedtime.  03/21/19  [provider]    Family History Family History  Problem Relation Age of Onset  . Diabetes Mother   . Diabetes Father     Social History Social History   Tobacco Use  . Smoking status: Never Smoker  . Smokeless tobacco: Never Used  Substance Use Topics  . Alcohol use: Not Currently   . Drug use: Never     Allergies   Patient has no known allergies.   Review of Systems Review of Systems  Constitutional: Negative for chills and fever.  HENT: Positive for congestion and rhinorrhea. Negative for ear pain, sinus pressure, sinus pain and sore throat.   Eyes: Negative for pain and visual disturbance.  Respiratory: Positive for cough, chest tightness and shortness of breath.   Cardiovascular: Positive for chest pain. Negative for palpitations.  Gastrointestinal: Negative for abdominal pain, diarrhea, nausea and vomiting.  Musculoskeletal: Negative for arthralgias, back pain and myalgias.  Skin: Negative for color change and rash.  Neurological: Negative for seizures, syncope and headaches.  All other systems reviewed and are negative.    Physical Exam Triage Vital Signs ED Triage Vitals  Enc Vitals Group     BP 05/22/19 0906 135/88     Pulse Rate 05/22/19 0906 (!) 106     Resp 05/22/19 0906 17     Temp 05/22/19 0906 98.4 F (36.9 C)     Temp Source 05/22/19 0906 Oral     SpO2 05/22/19 0906 100 %     Weight 05/22/19 0902 125 lb 9.6 oz (57 kg)     Height --      Head Circumference --      Peak Flow --      Pain Score 05/22/19 0902 3     Pain Loc --      Pain Edu? --      Excl. in [place]? --    No data found.  Updated Vital Signs BP 135/88 (BP Location: Left Arm)   Pulse (!) 106   Temp 98.4 F (36.9 C) (Oral)   Resp 17   Wt 125 lb 9.6 oz (57 kg)   SpO2 100%   BMI 21.56 kg/m   Visual Acuity Right Eye Distance:   Left Eye Distance:   Bilateral Distance:    Right Eye Near:   Left Eye Near:    Bilateral Near:     Physical Exam Vitals and nursing note reviewed.  Constitutional:      General: She is not in acute distress.    Appearance: She is well-developed. She is not ill-appearing.  HENT:     Head: Normocephalic and atraumatic.     Nose:     Right Turbinates: Swollen and pale.     Left Turbinates: Swollen and pale.     Right Sinus: No  maxillary sinus tenderness or frontal sinus tenderness.     Left Sinus: No maxillary sinus tenderness or frontal sinus tenderness.     Mouth/Throat:     Mouth: Mucous membranes are moist.     Pharynx: Oropharynx is clear.  Eyes:     Extraocular Movements: Extraocular movements intact.     Conjunctiva/sclera: Conjunctivae normal.     Pupils: Pupils are equal, round, and reactive to light.  Cardiovascular:     Rate and Rhythm: Regular rhythm. Tachycardia present.     Heart sounds: No murmur.  Pulmonary:     Effort: Pulmonary effort is normal. No tachypnea or respiratory distress.     Breath sounds: Normal breath sounds. No wheezing, rhonchi or rales.     Comments: She is not in respiratory distress and able to carry on full conversation without shortness sentences.  Saturating at 100% on room air respiratory rate at 17.  She does have seemingly decreased air movement overall however this appears to be an effort issue. Chest:     Chest wall: Tenderness (Over the inferior sternum with palpation) present.  Abdominal:     Palpations: Abdomen is soft.     Tenderness: There is no abdominal tenderness.  Musculoskeletal:     Cervical back: Neck supple.     Right lower leg: No tenderness.     Left lower leg: No tenderness.  Lymphadenopathy:     Cervical: No cervical adenopathy.  Skin:    General: Skin is warm and dry.  Neurological:     General: No focal deficit present.     Mental Status: She is alert and oriented to person, place, and time.  Psychiatric:        Mood and Affect: Mood normal.        Behavior: Behavior normal.      UC Treatments / Results  Labs (all labs ordered are listed, but only abnormal results are displayed) Labs Reviewed  NOVEL CORONAVIRUS, NAA (HOSP ORDER, SEND-OUT TO REF LAB; TAT 18-24 HRS)    EKG Normal sinus rhythm with PVC, otherwise normal ECG and without change from last  Radiology No results found.  Best x-ray from 04/22/2019 was reviewed.   Evidence of hyperinflation Procedures Procedures (including critical care time)  Medications Ordered in UC Medications - No data to display  Initial Impression / Assessment and Plan / UC Course  I have reviewed the triage vital signs and the nursing notes.  Pertinent labs & imaging results that were available during my care of the patient were reviewed by me and considered in my medical decision making (see chart for details).     #Cough #Shortness of breath Patient is a 59 year old female with past medical history of depression presenting for chronic shortness of breath and cough.  Though no formal lung testing has been conducted, given patient history and chest x-ray findings this would appear to be COPD.  Patient has not had follow-up with primary care or other specialist at this time.  He is currently afebrile and with nonproductive cough.  She is not in respiratory distress and saturating 100% on room air.  Tachycardia did improve to below 100.  Chest pain appears reproducible and with normal ECG reassuring. -Prednisone 40 mg x 5 days was prescribed -Patient was called following visit and discussed changing from albuterol to Combivent inhaler. -Given strict emergency department precautions such that if her chest pain or shortness of breath worsens she needs to go there. -Reiterated again to her that she needs to follow-up with primary care in 2 options were given in discharge instructions   Final Clinical Impressions(s) / UC Diagnoses   Final diagnoses:  SOB (shortness of breath)  Cough     Discharge Instructions     Please  call one of the offices provider to establish care  I want you to only utilize your albuterol inhaler if you are feeling short of breath take 1 puff.  This may be done up to every 6 hours.  Take the prednisone 4 tablets a day for the next 5 days.  Active Tessalon 1 tablet every 8 hours for cough.  If your chest pain or shortness of breath gets worse  when she go to emergency department.  If your Covid-19 test is positive, you will receive a phone call from [facility] regarding your results. Negative test results are not called. Both positive and negative results area always visible on MyChart. If you do not have a MyChart account, sign up instructions are in your discharge papers.   Persons who are directed to care for themselves at home may discontinue isolation under the following conditions:  . At least 10 days have passed since symptom onset and . At least 24 hours have passed without running a fever (this means without the use of fever-reducing medications) and . Other symptoms have improved.  Persons infected with COVID-19 who never develop symptoms may discontinue isolation and other precautions 10 days after the date of their first positive COVID-19 test.       ED Prescriptions    Medication Sig Dispense Auth. Provider   albuterol (VENTOLIN HFA) 108 (90 Base) MCG/ACT inhaler Inhale 1-2 puffs into the lungs every 6 (six) hours as needed for wheezing or shortness of breath. 18 g Clairessa Boulet, Veryl Speak, PA-C   predniSONE (DELTASONE) 10 MG tablet Take 4 tablets (40 mg total) by mouth daily for 5 days. 20 tablet Ajiah Mcglinn, Veryl Speak, PA-C   benzonatate (TESSALON) 100 MG capsule Take 1 capsule (100 mg total) by mouth every 8 (eight) hours. 21 capsule Tnia Anglada, Veryl Speak, PA-C     PDMP not reviewed this encounter.   Hermelinda Medicus, PA-C 05/22/19 1123    Tsion Inghram, Veryl Speak, PA-C 05/22/19 1227

## 2019-05-22 NOTE — Discharge Instructions (Addendum)
Please call one of the offices provider to establish care  I want you to only utilize your albuterol inhaler if you are feeling short of breath take 1 puff.  This may be done up to every 6 hours.  Take the prednisone 4 tablets a day for the next 5 days.  Active Tessalon 1 tablet every 8 hours for cough.  If your chest pain or shortness of breath gets worse when she go to emergency department.  If your Covid-19 test is positive, you will receive a phone call from [facility] regarding your results. Negative test results are not called. Both positive and negative results area always visible on MyChart. If you do not have a MyChart account, sign up instructions are in your discharge papers.   Persons who are directed to care for themselves at home may discontinue isolation under the following conditions:   At least 10 days have passed since symptom onset and  At least 24 hours have passed without running a fever (this means without the use of fever-reducing medications) and  Other symptoms have improved.  Persons infected with COVID-19 who never develop symptoms may discontinue isolation and other precautions 10 days after the date of their first positive COVID-19 test.

## 2019-05-23 LAB — NOVEL CORONAVIRUS, NAA (HOSP ORDER, SEND-OUT TO REF LAB; TAT 18-24 HRS): SARS-CoV-2, NAA: NOT DETECTED

## 2019-07-15 ENCOUNTER — Ambulatory Visit (HOSPITAL_COMMUNITY)
Admission: EM | Admit: 2019-07-15 | Discharge: 2019-07-15 | Disposition: A | Discharge status: Home / Self Care | Payer: Self-pay | Attending: Emergency Medicine | Admitting: Emergency Medicine

## 2019-07-15 ENCOUNTER — Ambulatory Visit (INDEPENDENT_AMBULATORY_CARE_PROVIDER_SITE_OTHER): Payer: Self-pay

## 2019-07-15 ENCOUNTER — Other Ambulatory Visit: Payer: Self-pay

## 2019-07-15 DIAGNOSIS — R05 Cough: Secondary | ICD-10-CM

## 2019-07-15 DIAGNOSIS — R059 Cough, unspecified: Secondary | ICD-10-CM

## 2019-07-15 MED ORDER — BENZONATATE 200 MG PO CAPS: 200.0000 mg | ORAL_CAPSULE | Freq: Three times a day (TID) | ORAL | 0 refills | Status: AC | PRN

## 2019-07-15 MED ORDER — PREDNISONE 20 MG PO TABS: 40.0000 mg | ORAL_TABLET | Freq: Every day | ORAL | 0 refills | Status: AC

## 2019-07-15 MED ORDER — CETIRIZINE HCL 10 MG PO CAPS: 10.0000 mg | ORAL_CAPSULE | Freq: Every day | ORAL | 0 refills | Status: DC

## 2019-07-15 MED ORDER — DOXYCYCLINE HYCLATE 100 MG PO CAPS: 100.0000 mg | ORAL_CAPSULE | Freq: Two times a day (BID) | ORAL | 0 refills | Status: AC

## 2019-07-15 MED ORDER — ALBUTEROL SULFATE HFA 108 (90 BASE) MCG/ACT IN AERS: 1.0000 | INHALATION_SPRAY | Freq: Four times a day (QID) | RESPIRATORY_TRACT | 0 refills | Status: DC | PRN

## 2019-07-15 NOTE — ED Provider Notes (Signed)
[place]    CSN: [id number] Arrival date & time: 07/15/19  1016      History   Chief Complaint Chief Complaint  Patient presents with  . Cough    HPI Nicole Cunningham is a 59 y.o. female history of hypertension, presenting today for evaluation of a cough.  Patient has had a cough that has been persistent for the past 3 to 4 months.  She has had prior Covid testing which was negative.  She is also had associated congestion and rhinorrhea.  Reports cough is productive with clear mucus.  She denies any fevers chills or body aches.  Has been using albuterol inhaler and Tessalon Perles with temporary relief of symptoms.  Does report some chest discomfort to central chest and right chest and side.  Denies history of asthma, believes she may have COPD. Denies tobacco use.  Denies leg pain or leg swelling.  Denies prior DVT/PE.  HPI  Past Medical History:  Diagnosis Date  . Bronchitis   . Hypertension     There are no problems to display for this patient.   No past surgical history on file.  OB History   No obstetric history on file.      Home Medications    Prior to Admission medications   Medication Sig Start Date End Date Taking? Authorizing Provider  albuterol (VENTOLIN HFA) 108 (90 Base) MCG/ACT inhaler Inhale 1-2 puffs into the lungs every 6 (six) hours as needed for wheezing or shortness of breath. 07/15/19   Inga Noller C, PA-C  benzonatate (TESSALON) 200 MG capsule Take 1 capsule (200 mg total) by mouth 3 (three) times daily as needed for up to 7 days for cough. 07/15/19 07/22/19  Kaelin Holford C, PA-C  Cetirizine HCl 10 MG CAPS Take 1 capsule (10 mg total) by mouth daily. 07/15/19   Alenna Russell C, PA-C  doxycycline (VIBRAMYCIN) 100 MG capsule Take 1 capsule (100 mg total) by mouth 2 (two) times daily for 10 days. 07/15/19 07/25/19  Joeanna Howdyshell C, PA-C  FLUoxetine (PROZAC) 20 MG capsule fluoxetine 20 mg capsule  Take 1 capsule every day by oral  route.    [provider]  FLUoxetine HCl (PROZAC PO) Take by mouth.    [provider]  Gabapentin 10 % CREA gabapentin  400mg     [provider]  Ipratropium-Albuterol (COMBIVENT RESPIMAT) 20-100 MCG/ACT AERS respimat Inhale 1 puff into the lungs every 6 (six) hours. 05/22/19   Darr, Marguerita Beards, PA-C  mirtazapine (REMERON) 30 MG tablet mirtazapine 30 mg tablet  Take 1 tablet every day by oral route.    [provider]  predniSONE (DELTASONE) 20 MG tablet Take 2 tablets (40 mg total) by mouth daily with breakfast for 5 days. 07/15/19 07/20/19  Gianah Batt C, PA-C  QUEtiapine (SEROQUEL) 300 MG tablet Take 300 mg by mouth at bedtime.    [provider]  QUEtiapine (SEROQUEL) 300 MG tablet quetiapine 300 mg tablet  Take 1 tablet every day by oral route.    [provider]  UNKNOWN TO PATIENT One HTN med; unk name    [provider]  gabapentin (NEURONTIN) 300 MG capsule Take 300 mg by mouth at bedtime.  03/21/19  [provider]    Family History Family History  Problem Relation Age of Onset  . Diabetes Mother   . Diabetes Father     Social History Social History   Tobacco Use  . Smoking status: Never Smoker  .  Smokeless tobacco: Never Used  Substance Use Topics  . Alcohol use: Not Currently  . Drug use: Never     Allergies   Patient has no known allergies.   Review of Systems Review of Systems  Constitutional: Negative for activity change, appetite change, chills, fatigue and fever.  HENT: Positive for congestion and rhinorrhea. Negative for ear pain, sinus pressure, sore throat and trouble swallowing.   Eyes: Negative for discharge and redness.  Respiratory: Positive for cough and shortness of breath. Negative for chest tightness.   Cardiovascular: Negative for chest pain.  Gastrointestinal: Negative for abdominal pain, diarrhea, nausea and vomiting.  Musculoskeletal: Negative for myalgias.  Skin:  Negative for rash.  Neurological: Negative for dizziness, light-headedness and headaches.     Physical Exam Triage Vital Signs ED Triage Vitals [07/15/19 1126]  Enc Vitals Group     BP (!) 142/95     Pulse Rate (!) 109     Resp 16     Temp 98.1 F (36.7 C)     Temp src      SpO2 100 %     Weight      Height      Head Circumference      Peak Flow      Pain Score 0     Pain Loc      Pain Edu?      Excl. in GC?    No data found.  Updated Vital Signs BP (!) 142/95   Pulse (!) 109   Temp 98.1 F (36.7 C)   Resp 16   SpO2 100%   Visual Acuity Right Eye Distance:   Left Eye Distance:   Bilateral Distance:    Right Eye Near:   Left Eye Near:    Bilateral Near:     Physical Exam Vitals and nursing note reviewed.  Constitutional:      Appearance: She is well-developed.     Comments: No acute distress  HENT:     Head: Normocephalic and atraumatic.     Ears:     Comments: Bilateral ears without tenderness to palpation of external auricle, tragus and mastoid, EAC's without erythema or swelling, TM's with good bony landmarks and cone of light. Non erythematous.     Nose: Nose normal.     Comments: Nasal mucosa pink, nonswollen turbinates    Mouth/Throat:     Comments: Oral mucosa pink and moist, no tonsillar enlargement or exudate. Posterior pharynx patent and nonerythematous, no uvula deviation or swelling. Normal phonation. Eyes:     Conjunctiva/sclera: Conjunctivae normal.  Cardiovascular:     Rate and Rhythm: Normal rate.  Pulmonary:     Effort: Pulmonary effort is normal. No respiratory distress.     Comments: Breathing comfortably at rest, CTABL, no wheezing, rales or other adventitious sounds auscultated Abdominal:     General: There is no distension.  Musculoskeletal:        General: Normal range of motion.     Cervical back: Neck supple.  Skin:    General: Skin is warm and dry.  Neurological:     Mental Status: She is alert and oriented to person,  place, and time.      UC Treatments / Results  Labs (all labs ordered are listed, but only abnormal results are displayed) Labs Reviewed - No data to display  EKG   Radiology DG Chest 2 View  Result Date: 07/15/2019 CLINICAL DATA:  Productive cough, shortness of breath. EXAM: CHEST - 2  VIEW COMPARISON:  April 22, 2019 FINDINGS: The heart size and mediastinal contours are within normal limits. Both lungs are clear. No pneumothorax or pleural effusion is noted. The visualized skeletal structures are unremarkable. IMPRESSION: No active cardiopulmonary disease. Electronically Signed   By: Lupita Raider M.D.   On: 07/15/2019 11:55    Procedures Procedures (including critical care time)  Medications Ordered in UC Medications - No data to display  Initial Impression / Assessment and Plan / UC Course  I have reviewed the triage vital signs and the nursing notes.  Pertinent labs & imaging results that were available during my care of the patient were reviewed by me and considered in my medical decision making (see chart for details).     X-ray unremarkable.  Given length of symptoms we will go ahead and cover with antibiotics for atypicals with doxycycline.  Also reinitiating course of prednisone, refilling albuterol and Tessalon.  Cetirizine to help with nasal congestion and drainage. If she continues to have recurrent similar spells, discussed following up with pulmonology.  Discussed strict return precautions. Patient verbalized understanding and is agreeable with plan.  Final Clinical Impressions(s) / UC Diagnoses   Final diagnoses:  Cough     Discharge Instructions     Chest xray normal, no pneumonia Given her symptoms have been going on for many months I am going to go ahead and start her on antibiotic, begin doxycycline twice daily for the next 10 days, take with food Prednisone 40 mg daily for the next 5 days to help calm inflammation within chest and  sinuses Continue to use Tessalon/benzonatate every 8 hours as needed for cough Daily cetirizine to help with nasal congestion and postnasal drainage  Please follow-up if any symptoms not improving or worsening    ED Prescriptions    Medication Sig Dispense Auth. Provider   benzonatate (TESSALON) 200 MG capsule Take 1 capsule (200 mg total) by mouth 3 (three) times daily as needed for up to 7 days for cough. 28 capsule Kaleah Hagemeister C, PA-C   doxycycline (VIBRAMYCIN) 100 MG capsule Take 1 capsule (100 mg total) by mouth 2 (two) times daily for 10 days. 20 capsule Laneya Gasaway C, PA-C   predniSONE (DELTASONE) 20 MG tablet Take 2 tablets (40 mg total) by mouth daily with breakfast for 5 days. 10 tablet Jaryah Aracena C, PA-C   albuterol (VENTOLIN HFA) 108 (90 Base) MCG/ACT inhaler Inhale 1-2 puffs into the lungs every 6 (six) hours as needed for wheezing or shortness of breath. 18 g Jeanell Mangan C, PA-C   Cetirizine HCl 10 MG CAPS Take 1 capsule (10 mg total) by mouth daily. 15 capsule Neale Marzette, [place] C, PA-C     PDMP not reviewed this encounter.   Lew Dawes, [place] 07/15/19 1211

## 2019-07-15 NOTE — Discharge Instructions (Addendum)
Chest xray normal, no pneumonia Given her symptoms have been going on for many months I am going to go ahead and start her on antibiotic, begin doxycycline twice daily for the next 10 days, take with food Prednisone 40 mg daily for the next 5 days to help calm inflammation within chest and sinuses Continue to use Tessalon/benzonatate every 8 hours as needed for cough Daily cetirizine to help with nasal congestion and postnasal drainage  Please follow-up if any symptoms not improving or worsening

## 2019-07-15 NOTE — ED Triage Notes (Signed)
Pt c/o cough, congestion, runny nose x 1 month

## 2019-07-16 ENCOUNTER — Telehealth (HOSPITAL_COMMUNITY): Payer: Self-pay

## 2019-07-16 NOTE — Telephone Encounter (Signed)
I attempted to return pt's call. I left a message on pt's machine to call back.

## 2019-08-14 ENCOUNTER — Other Ambulatory Visit: Payer: Self-pay

## 2019-08-14 ENCOUNTER — Ambulatory Visit (HOSPITAL_COMMUNITY)
Admission: EM | Admit: 2019-08-14 | Discharge: 2019-08-14 | Disposition: A | Discharge status: Home / Self Care | Payer: HRSA Program | Attending: Family Medicine | Admitting: Family Medicine

## 2019-08-14 ENCOUNTER — Encounter (HOSPITAL_COMMUNITY): Payer: Self-pay

## 2019-08-14 DIAGNOSIS — Z20822 Contact with and (suspected) exposure to covid-19: Secondary | ICD-10-CM | POA: Insufficient documentation

## 2019-08-14 DIAGNOSIS — R059 Cough, unspecified: Secondary | ICD-10-CM

## 2019-08-14 DIAGNOSIS — T7840XA Allergy, unspecified, initial encounter: Secondary | ICD-10-CM

## 2019-08-14 DIAGNOSIS — I10 Essential (primary) hypertension: Secondary | ICD-10-CM | POA: Insufficient documentation

## 2019-08-14 DIAGNOSIS — R05 Cough: Secondary | ICD-10-CM | POA: Insufficient documentation

## 2019-08-14 DIAGNOSIS — R0602 Shortness of breath: Secondary | ICD-10-CM | POA: Insufficient documentation

## 2019-08-14 DIAGNOSIS — R519 Headache, unspecified: Secondary | ICD-10-CM | POA: Diagnosis not present

## 2019-08-14 DIAGNOSIS — Z79899 Other long term (current) drug therapy: Secondary | ICD-10-CM | POA: Diagnosis not present

## 2019-08-14 MED ORDER — CETIRIZINE HCL 10 MG PO CAPS: 10.0000 mg | ORAL_CAPSULE | Freq: Every day | ORAL | 0 refills | Status: DC

## 2019-08-14 MED ORDER — ALBUTEROL SULFATE HFA 108 (90 BASE) MCG/ACT IN AERS: 1.0000 | INHALATION_SPRAY | Freq: Four times a day (QID) | RESPIRATORY_TRACT | 0 refills | Status: DC | PRN

## 2019-08-14 MED ORDER — BENZONATATE 100 MG PO CAPS: 100.0000 mg | ORAL_CAPSULE | Freq: Three times a day (TID) | ORAL | 0 refills | Status: DC

## 2019-08-14 NOTE — Discharge Instructions (Addendum)
I believe this is allergies Take the medicine as prescribed Follow up as needed for continued or worsening symptoms

## 2019-08-14 NOTE — ED Triage Notes (Addendum)
Pt c/o productive cough with clear sputum, runny nose, congestion, HA, SOB for approx 4 days. States she was dx with bronchitis last year and has been unable to get consistent refills of medications that she thinks she needs. States no longer has inhaler and was given a "blue pill" instead of inhaler.   Able to speak full sentences Denies abdom pain, n/v/d, fever, chills. Pt states she has had one COVID vaccine.  After several minutes of assessment, pt stated, " I forgot, I've been having chest pain a little bit.". Reports CP last night, none now.  Describes CP as "nagging/sharp" sometimes, that radiates to her back. Denies diaphoresis, n/v, blurred speech, dizziness.

## 2019-08-14 NOTE — ED Provider Notes (Signed)
[facility]    CSN: [id number] Arrival date & time: 08/14/19  1017      History   Chief Complaint Chief Complaint  Patient presents with  . Cough  . Shortness of Breath    HPI Nicole Cunningham is a 59 y.o. female.   Patient is a 59 year old female past medical history of bronchitis and hypertension.  She presents today with productive cough, clear sputum, runny nose, sneezing, congestion, headache and mild shortness of breath for 4 days.  Symptoms have been constant.  This seems to be a reoccurring issue for her.  Worse with going outside into the heat.  She has ran out of her inhaler and took her last " blue pill" that she reports is for allergies this am.  No fever, chills, body aches, night sweats.  ROS per HPI      Past Medical History:  Diagnosis Date  . Bronchitis   . Hypertension     There are no problems to display for this patient.   History reviewed. No pertinent surgical history.  OB History   No obstetric history on file.      Home Medications    Prior to Admission medications   Medication Sig Start Date End Date Taking? Authorizing Provider  albuterol (VENTOLIN HFA) 108 (90 Base) MCG/ACT inhaler Inhale 1-2 puffs into the lungs every 6 (six) hours as needed for wheezing or shortness of breath. 08/14/19   Itza Maniaci A, NP  benzonatate (TESSALON) 100 MG capsule Take 1 capsule (100 mg total) by mouth every 8 (eight) hours. 08/14/19   Dahlia Byes A, NP  Cetirizine HCl 10 MG CAPS Take 1 capsule (10 mg total) by mouth daily. 08/14/19   Dahlia Byes A, NP  FLUoxetine (PROZAC) 20 MG capsule fluoxetine 20 mg capsule  Take 1 capsule every day by oral route.    [provider]  FLUoxetine HCl (PROZAC PO) Take by mouth.    [provider]  Gabapentin 10 % CREA gabapentin  400mg     [provider]  Ipratropium-Albuterol (COMBIVENT RESPIMAT) 20-100 MCG/ACT AERS respimat Inhale 1 puff into the lungs every 6 (six) hours. 05/22/19    Darr, 05/24/19, PA-C  mirtazapine (REMERON) 30 MG tablet mirtazapine 30 mg tablet  Take 1 tablet every day by oral route.    [provider]  QUEtiapine (SEROQUEL) 300 MG tablet Take 300 mg by mouth at bedtime.    [provider]  QUEtiapine (SEROQUEL) 300 MG tablet quetiapine 300 mg tablet  Take 1 tablet every day by oral route.    [provider]  UNKNOWN TO PATIENT One HTN med; unk name    [provider]  gabapentin (NEURONTIN) 300 MG capsule Take 300 mg by mouth at bedtime.  03/21/19  [provider]    Family History Family History  Problem Relation Age of Onset  . Diabetes Mother   . Diabetes Father     Social History Social History   Tobacco Use  . Smoking status: Never Smoker  . Smokeless tobacco: Never Used  Substance Use Topics  . Alcohol use: Not Currently  . Drug use: Never     Allergies   Patient has no known allergies.   Review of Systems Review of Systems   Physical Exam Triage Vital Signs ED Triage Vitals  Enc Vitals Group     BP 08/14/19 1110 (!) 151/102     Pulse Rate 08/14/19 1110 (!) 111  Resp 08/14/19 1110 20     Temp 08/14/19 1110 97.9 F (36.6 C)     Temp Source 08/14/19 1110 Oral     SpO2 08/14/19 1110 100 %     Weight --      Height --      Head Circumference --      Peak Flow --      Pain Score 08/14/19 1107 0     Pain Loc --      Pain Edu? --      Excl. in GC? --    No data found.  Updated Vital Signs BP (!) 151/102 (BP Location: Left Arm)   Pulse (!) 111   Temp 97.9 F (36.6 C) (Oral)   Resp 20   SpO2 100%   Visual Acuity Right Eye Distance:   Left Eye Distance:   Bilateral Distance:    Right Eye Near:   Left Eye Near:    Bilateral Near:     Physical Exam Vitals and nursing note reviewed.  Constitutional:      General: She is not in acute distress.    Appearance: Normal appearance. She is not ill-appearing, toxic-appearing or diaphoretic.  HENT:     Head:  Normocephalic.     Right Ear: Tympanic membrane and ear canal normal.     Left Ear: Tympanic membrane and ear canal normal.     Nose: Congestion and rhinorrhea present.     Mouth/Throat:     Pharynx: Oropharynx is clear.  Eyes:     Conjunctiva/sclera: Conjunctivae normal.  Cardiovascular:     Rate and Rhythm: Normal rate and regular rhythm.  Pulmonary:     Effort: Pulmonary effort is normal.     Breath sounds: Normal breath sounds.  Musculoskeletal:        General: Normal range of motion.     Cervical back: Normal range of motion.  Skin:    General: Skin is warm and dry.     Findings: No rash.  Neurological:     Mental Status: She is alert.  Psychiatric:        Mood and Affect: Mood normal.      UC Treatments / Results  Labs (all labs ordered are listed, but only abnormal results are displayed) Labs Reviewed  SARS CORONAVIRUS 2 (TAT 6-24 HRS)    EKG   Radiology No results found.  Procedures Procedures (including critical care time)  Medications Ordered in UC Medications - No data to display  Initial Impression / Assessment and Plan / UC Course  I have reviewed the triage vital signs and the nursing notes.  Pertinent labs & imaging results that were available during my care of the patient were reviewed by me and considered in my medical decision making (see chart for details).     Allergies Is most likely the cause of her symptoms Lungs clear on exam.  Exam mostly benign.  Vital signs are within normal limits. She is mildly tachycardic.  This is consistent with previous vitals. Refilled her Zyrtec and albuterol inhaler  Tessalon Perles for cough Follow up as needed for continued or worsening symptoms  Final Clinical Impressions(s) / UC Diagnoses   Final diagnoses:  Allergy, initial encounter  Cough     Discharge Instructions     I believe this is allergies Take the medicine as prescribed Follow up as needed for continued or worsening  symptoms     ED Prescriptions    Medication Sig Dispense Auth. Provider  albuterol (VENTOLIN HFA) 108 (90 Base) MCG/ACT inhaler Inhale 1-2 puffs into the lungs every 6 (six) hours as needed for wheezing or shortness of breath. 18 g Raynette Arras A, NP   Cetirizine HCl 10 MG CAPS Take 1 capsule (10 mg total) by mouth daily. 15 capsule Johaan Ryser A, NP   benzonatate (TESSALON) 100 MG capsule Take 1 capsule (100 mg total) by mouth every 8 (eight) hours. 21 capsule Yandell Mcjunkins A, NP     PDMP not reviewed this encounter.   Orvan July, NP 08/14/19 1151

## 2019-08-15 LAB — SARS CORONAVIRUS 2 (TAT 6-24 HRS): SARS Coronavirus 2: NEGATIVE

## 2019-09-03 ENCOUNTER — Emergency Department (HOSPITAL_COMMUNITY)
Admission: EM | Admit: 2019-09-03 | Discharge: 2019-09-03 | Disposition: A | Discharge status: Home / Self Care | Payer: Self-pay | Attending: Emergency Medicine | Admitting: Emergency Medicine

## 2019-09-03 ENCOUNTER — Emergency Department (HOSPITAL_COMMUNITY): Payer: Self-pay

## 2019-09-03 ENCOUNTER — Encounter (HOSPITAL_COMMUNITY): Payer: Self-pay

## 2019-09-03 DIAGNOSIS — Z79899 Other long term (current) drug therapy: Secondary | ICD-10-CM | POA: Insufficient documentation

## 2019-09-03 DIAGNOSIS — I10 Essential (primary) hypertension: Secondary | ICD-10-CM | POA: Insufficient documentation

## 2019-09-03 DIAGNOSIS — R062 Wheezing: Secondary | ICD-10-CM | POA: Insufficient documentation

## 2019-09-03 DIAGNOSIS — R0789 Other chest pain: Secondary | ICD-10-CM | POA: Insufficient documentation

## 2019-09-03 MED ORDER — METHYLPREDNISOLONE SODIUM SUCC 125 MG IJ SOLR
125.0000 mg | Freq: Once | INTRAMUSCULAR | Status: AC
  Administered 2019-09-03: 125 mg via INTRAVENOUS
  Filled 2019-09-03: qty 2

## 2019-09-03 MED ORDER — ALBUTEROL SULFATE HFA 108 (90 BASE) MCG/ACT IN AERS
6.0000 | INHALATION_SPRAY | Freq: Once | RESPIRATORY_TRACT | Status: AC
  Administered 2019-09-03: 6 via RESPIRATORY_TRACT
  Filled 2019-09-03: qty 6.7

## 2019-09-03 MED ORDER — IPRATROPIUM BROMIDE HFA 17 MCG/ACT IN AERS
2.0000 | INHALATION_SPRAY | Freq: Once | RESPIRATORY_TRACT | Status: AC
  Administered 2019-09-03: 2 via RESPIRATORY_TRACT
  Filled 2019-09-03: qty 12.9

## 2019-09-03 MED ORDER — MONTELUKAST SODIUM 10 MG PO TABS
10.0000 mg | ORAL_TABLET | Freq: Every day | ORAL | 0 refills | Status: DC
Start: 2019-09-03 — End: 2019-12-10

## 2019-09-03 MED ORDER — PREDNISONE 20 MG PO TABS: 40.0000 mg | ORAL_TABLET | Freq: Every day | ORAL | 0 refills | Status: AC

## 2019-09-03 NOTE — ED Provider Notes (Signed)
[facility]-EMERGENCY DEPT Provider Note   CSN: [id number] Arrival date & time: 09/03/19  1655     History Chief Complaint  Patient presents with  . Shortness of Breath    Nicole Cunningham is a 59 y.o. female presenting for evaluation of shortness of breath.   Patient states today she has felt more short of breath than normal.  She feels like she is having chest tightness and wheezing.  She reports associated nasal congestion.  She does not have any more of her inhaler which was given to her several weeks ago.  She finished her allergy medicine several days ago. She has been seen for similar symptoms multiple times with the past 4 months or urgent care.  She has not been able to establish with primary care follow-up with pulmonology.  She denies fevers, chills, sore throat, chest pain, nausea, vomiting, abd oain, urinary symptoms, abnormal bowel movements.  She denies leg pain or swelling.  She denies recent travel, surgeries, immobilization, history of cancer, history previous DVT/PE, or hormone use.  Additional history obtained from chart review. This is pt's 5 visit for SOB/wheezing. She is frequently tachycardic on arrival. Has received multiple rounds of abx, albuterol, prednisone, and allergy meds.   HPI     Past Medical History:  Diagnosis Date  . Bronchitis   . Hypertension     There are no problems to display for this patient.   History reviewed. No pertinent surgical history.   OB History   No obstetric history on file.     Family History  Problem Relation Age of Onset  . Diabetes Mother   . Diabetes Father     Social History   Tobacco Use  . Smoking status: Never Smoker  . Smokeless tobacco: Never Used  Vaping Use  . Vaping Use: Never used  Substance Use Topics  . Alcohol use: Not Currently  . Drug use: Never    Home Medications Prior to Admission medications   Medication Sig Start Date End Date Taking? Authorizing Provider   albuterol (VENTOLIN HFA) 108 (90 Base) MCG/ACT inhaler Inhale 1-2 puffs into the lungs every 6 (six) hours as needed for wheezing or shortness of breath. 08/14/19   Bast, Traci A, NP  benzonatate (TESSALON) 100 MG capsule Take 1 capsule (100 mg total) by mouth every 8 (eight) hours. 08/14/19   Dahlia Byes A, NP  Cetirizine HCl 10 MG CAPS Take 1 capsule (10 mg total) by mouth daily. 08/14/19   Dahlia Byes A, NP  FLUoxetine (PROZAC) 20 MG capsule fluoxetine 20 mg capsule  Take 1 capsule every day by oral route.    [provider]  FLUoxetine HCl (PROZAC PO) Take by mouth.    [provider]  Gabapentin 10 % CREA gabapentin  400mg     [provider]  Ipratropium-Albuterol (COMBIVENT RESPIMAT) 20-100 MCG/ACT AERS respimat Inhale 1 puff into the lungs every 6 (six) hours. 05/22/19   Darr, 05/24/19, PA-C  mirtazapine (REMERON) 30 MG tablet mirtazapine 30 mg tablet  Take 1 tablet every day by oral route.    [provider]  montelukast (SINGULAIR) 10 MG tablet Take 1 tablet (10 mg total) by mouth at bedtime. 09/03/19   [place], Sophia, PA-C  predniSONE (DELTASONE) 20 MG tablet Take 2 tablets (40 mg total) by mouth daily for 4 days. 09/04/19 09/08/19  Caccavale, Sophia, PA-C  QUEtiapine (SEROQUEL) 300 MG tablet Take 300 mg by mouth at bedtime.    [provider]  QUEtiapine (SEROQUEL) 300 MG tablet quetiapine 300 mg tablet  Take 1 tablet every day by oral route.    [provider]  UNKNOWN TO PATIENT One HTN med; unk name    [provider]  gabapentin (NEURONTIN) 300 MG capsule Take 300 mg by mouth at bedtime.  03/21/19  [provider]    Allergies    Patient has no known allergies.  Review of Systems   Review of Systems  Respiratory: Positive for chest tightness, shortness of breath and wheezing.   All other systems reviewed and are negative.   Physical Exam Updated Vital Signs BP (!) 174/97   Pulse 98   Temp 99.1 F (37.3  C) (Oral)   Resp 19   SpO2 95%   Physical Exam Vitals and nursing note reviewed.  Constitutional:      General: She is not in acute distress.    Appearance: She is well-developed.     Comments: Appears nontoxic  HENT:     Head: Normocephalic and atraumatic.  Eyes:     Conjunctiva/sclera: Conjunctivae normal.     Pupils: Pupils are equal, round, and reactive to light.  Cardiovascular:     Rate and Rhythm: Regular rhythm. Tachycardia present.     Pulses: Normal pulses.     Comments: Tachycardic around 115 Pulmonary:     Effort: Pulmonary effort is normal. No respiratory distress.     Breath sounds: Wheezing present.     Comments: Inspiratory and expiratory wheezing in all fields.  Speaking in full sentences.  Sats in the upper 90s on room air Abdominal:     General: There is no distension.     Palpations: Abdomen is soft. There is no mass.     Tenderness: There is no abdominal tenderness. There is no guarding or rebound.  Musculoskeletal:        General: Normal range of motion.     Cervical back: Normal range of motion and neck supple.     Right lower leg: No edema.     Left lower leg: No edema.     Comments: No leg pain or swelling  Skin:    General: Skin is warm and dry.  Neurological:     Mental Status: She is alert and oriented to person, place, and time.     ED Results / Procedures / Treatments   Labs (all labs ordered are listed, but only abnormal results are displayed) Labs Reviewed - No data to display  EKG None  Radiology DG Chest 2 View  Result Date: 09/03/2019 CLINICAL DATA:  Shortness of breath EXAM: CHEST - 2 VIEW COMPARISON:  07/15/2019 FINDINGS: The heart size and mediastinal contours are within normal limits. Chronic mild right apical scarring. Both lungs are clear. The visualized skeletal structures are unremarkable. IMPRESSION: No active cardiopulmonary disease. Electronically Signed   By: Duanne Guess D.O.   On: 09/03/2019 17:55     Procedures Procedures (including critical care time)  Medications Ordered in ED Medications  albuterol (VENTOLIN HFA) 108 (90 Base) MCG/ACT inhaler 6 puff (6 puffs Inhalation Given 09/03/19 1805)  ipratropium (ATROVENT HFA) inhaler 2 puff (2 puffs Inhalation Given 09/03/19 1806)  methylPREDNISolone sodium succinate (SOLU-MEDROL) 125 mg/2 mL injection 125 mg (125 mg Intravenous Given 09/03/19 1807)    ED Course  I have reviewed the triage vital signs and the nursing notes.  Pertinent labs & imaging results that were available during my care of the patient were reviewed by  me and considered in my medical decision making (see chart for details).    MDM Rules/Calculators/A&P                          Pt presenting for evaluation of worsening shortness of breath, chest tightness, and wheezing.  On exam, patient has inspiratory and expiratory wheezing.  However sats are stable on room air, and she does not appear in acute distress.  She is tachycardic, although this is normal for her.  She is hypertensive, although this improved without intervention throughout her ER stay.  Chest x-ray obtained from triage read interpreted by me, no pneumonia pneumothorax effusion, cardiomegaly.  EKG shows sinus tach without signs of ischemia.  She has no chest pain, doubt ACS.  Low suspicion for PE, she does not have risk factors, and her tachycardia is normal for her per chart review.  Will treat with albuterol, ipratropium, Solu-Medrol, and reassess.  On reassessment, patient reports symptoms have improved significantly.  Her tachycardia has resolved.  Intermittent scattered expiratory wheeze, however overall lung sounds are much improved from previous.  Discussed that as she has no fever and symptoms again today, I do not believe she needs antibiotics.  Discussed appropriate use of albuterol.  Discussed short course of prednisone, my concerns about her frequent visits for breathing problems.  Encouraged follow-up  with primary care and pulmonology, information given.  At this time, patient appears safe for discharge.  Return precautions given.  Patient states she understands and agrees to plan.  Final Clinical Impression(s) / ED Diagnoses Final diagnoses:  Wheezing    Rx / DC Orders ED Discharge Orders         Ordered    predniSONE (DELTASONE) 20 MG tablet  Daily     Discontinue  Reprint     09/03/19 1904    montelukast (SINGULAIR) 10 MG tablet  Daily at bedtime     Discontinue  Reprint     09/03/19 [place], Sophia, PA-C 09/03/19 2123    Deno Etienne, DO 09/03/19 2340

## 2019-09-03 NOTE — Discharge Instructions (Signed)
It is extremely important that you make a follow-up appointment with primary care and pulmonology.  Both offices are listed below. Take steroids as prescribed, starting tomorrow. Take Singulair to help with allergies/congestion. Use the albuterol inhaler only as needed for shortness of breath or wheezing. Return to the emergency room with any new, worsening, or concerning symptoms.

## 2019-09-03 NOTE — ED Triage Notes (Signed)
Patient arrived via [facility] from home.   C/O shob   ronchi for past couple of months Just finished round of antibiotics yesterday   Patient reports she doesn't feel that she is getting better  BP-205/150  Hx. Hypertension and states she is compliant'  Wheezing in left upper per ems other lobes clear  94% RA Patient placed on 2 liters for comfort measures and now 1005   HR-124 with ems    Patient reports using albuterol inhaler today.

## 2019-09-07 ENCOUNTER — Other Ambulatory Visit: Payer: Self-pay

## 2019-09-07 ENCOUNTER — Ambulatory Visit (INDEPENDENT_AMBULATORY_CARE_PROVIDER_SITE_OTHER): Payer: No Payment, Other | Admitting: Clinical

## 2019-09-07 DIAGNOSIS — F331 Major depressive disorder, recurrent, moderate: Secondary | ICD-10-CM | POA: Diagnosis not present
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
## 2019-09-15 ENCOUNTER — Ambulatory Visit (INDEPENDENT_AMBULATORY_CARE_PROVIDER_SITE_OTHER): Payer: No Payment, Other | Admitting: Psychiatry

## 2019-09-15 ENCOUNTER — Other Ambulatory Visit: Payer: Self-pay

## 2019-09-15 ENCOUNTER — Encounter (HOSPITAL_COMMUNITY): Payer: Self-pay | Admitting: Psychiatry

## 2019-09-15 DIAGNOSIS — F3341 Major depressive disorder, recurrent, in partial remission: Secondary | ICD-10-CM

## 2019-09-15 DIAGNOSIS — F419 Anxiety disorder, unspecified: Secondary | ICD-10-CM | POA: Diagnosis not present

## 2019-09-15 MED ORDER — QUETIAPINE FUMARATE 300 MG PO TABS: ORAL_TABLET | ORAL | 1 refills | Status: DC

## 2019-09-15 MED ORDER — MIRTAZAPINE 30 MG PO TABS: 30.0000 mg | ORAL_TABLET | Freq: Every day | ORAL | 1 refills | Status: DC

## 2019-09-15 MED ORDER — FLUOXETINE HCL 40 MG PO CAPS: 40.0000 mg | ORAL_CAPSULE | Freq: Every day | ORAL | 1 refills | Status: DC

## 2019-09-15 MED ORDER — GABAPENTIN 400 MG PO CAPS: 400.0000 mg | ORAL_CAPSULE | Freq: Every day | ORAL | 1 refills | Status: DC
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
## 2019-09-29 ENCOUNTER — Ambulatory Visit (HOSPITAL_COMMUNITY): Payer: Self-pay | Admitting: Clinical

## 2019-10-09 ENCOUNTER — Other Ambulatory Visit: Payer: Self-pay

## 2019-10-09 ENCOUNTER — Emergency Department (HOSPITAL_COMMUNITY)
Admission: EM | Admit: 2019-10-09 | Discharge: 2019-10-09 | Disposition: A | Discharge status: Home / Self Care | Payer: Self-pay | Attending: Emergency Medicine | Admitting: Emergency Medicine

## 2019-10-09 ENCOUNTER — Encounter (HOSPITAL_COMMUNITY): Payer: Self-pay

## 2019-10-09 ENCOUNTER — Emergency Department (HOSPITAL_COMMUNITY): Payer: Self-pay

## 2019-10-09 DIAGNOSIS — Z7951 Long term (current) use of inhaled steroids: Secondary | ICD-10-CM | POA: Insufficient documentation

## 2019-10-09 DIAGNOSIS — Z20822 Contact with and (suspected) exposure to covid-19: Secondary | ICD-10-CM | POA: Insufficient documentation

## 2019-10-09 DIAGNOSIS — J441 Chronic obstructive pulmonary disease with (acute) exacerbation: Secondary | ICD-10-CM | POA: Insufficient documentation

## 2019-10-09 DIAGNOSIS — I10 Essential (primary) hypertension: Secondary | ICD-10-CM | POA: Insufficient documentation

## 2019-10-09 LAB — POC SARS CORONAVIRUS 2 AG -  ED: SARS Coronavirus 2 Ag: NEGATIVE

## 2019-10-09 MED ORDER — AZITHROMYCIN 250 MG PO TABS
250.0000 mg | ORAL_TABLET | Freq: Every day | ORAL | 0 refills | Status: AC
Start: 2019-10-09 — End: 2019-10-13

## 2019-10-09 MED ORDER — ALBUTEROL SULFATE HFA 108 (90 BASE) MCG/ACT IN AERS
2.0000 | INHALATION_SPRAY | Freq: Once | RESPIRATORY_TRACT | Status: AC
  Administered 2019-10-09: 2 via RESPIRATORY_TRACT
  Filled 2019-10-09: qty 6.7

## 2019-10-09 MED ORDER — PREDNISONE 20 MG PO TABS
40.0000 mg | ORAL_TABLET | Freq: Once | ORAL | Status: AC
  Administered 2019-10-09: 40 mg via ORAL
  Filled 2019-10-09: qty 2

## 2019-10-09 MED ORDER — AZITHROMYCIN 250 MG PO TABS
500.0000 mg | ORAL_TABLET | Freq: Once | ORAL | Status: AC
  Administered 2019-10-09: 500 mg via ORAL
  Filled 2019-10-09: qty 2

## 2019-10-09 MED ORDER — ACETAMINOPHEN 500 MG PO TABS
1000.0000 mg | ORAL_TABLET | Freq: Once | ORAL | Status: AC
  Administered 2019-10-09: 1000 mg via ORAL
  Filled 2019-10-09: qty 2

## 2019-10-09 MED ORDER — PREDNISONE 10 MG PO TABS
40.0000 mg | ORAL_TABLET | Freq: Every day | ORAL | 0 refills | Status: AC
Start: 2019-10-09 — End: 2019-10-13

## 2019-10-09 NOTE — ED Provider Notes (Signed)
[facility]-EMERGENCY DEPT Provider Note   CSN: [id number] Arrival date & time: 10/09/19  1040     History Chief Complaint  Patient presents with  . Shortness of Breath    Nicole Cunningham is a 59 y.o. female.   Cough Cough characteristics:  Productive Sputum characteristics:  Clear Severity:  Moderate Onset quality:  Gradual Timing:  Constant Progression:  Worsening Chronicity:  Recurrent Smoker: no   Context comment:  Hx of chronic bronchitis Relieved by:  Nothing Worsened by:  Nothing Ineffective treatments:  None tried Associated symptoms: headaches (mild)   Associated symptoms: no chest pain, no chills, no fever, no rash, no rhinorrhea and no shortness of breath        Past Medical History:  Diagnosis Date  . Bronchitis   . Hypertension     Patient Active Problem List   Diagnosis Date Noted  . Major depressive disorder, recurrent episode, moderate (HCC) 09/07/2019    History reviewed. No pertinent surgical history.   OB History   No obstetric history on file.     Family History  Problem Relation Age of Onset  . Diabetes Mother   . Diabetes Father     Social History   Tobacco Use  . Smoking status: Never Smoker  . Smokeless tobacco: Never Used  Vaping Use  . Vaping Use: Never used  Substance Use Topics  . Alcohol use: Not Currently  . Drug use: Never    Home Medications Prior to Admission medications   Medication Sig Start Date End Date Taking? Authorizing Provider  albuterol (VENTOLIN HFA) 108 (90 Base) MCG/ACT inhaler Inhale 1-2 puffs into the lungs every 6 (six) hours as needed for wheezing or shortness of breath. 08/14/19  Yes Bast, Traci A, NP  FLUoxetine (PROZAC) 40 MG capsule Take 1 capsule (40 mg total) by mouth daily. 09/15/19  Yes Zena Amos, MD  gabapentin (NEURONTIN) 400 MG capsule Take 1 capsule (400 mg total) by mouth at bedtime. 09/15/19  Yes Zena Amos, MD  ipratropium (ATROVENT HFA) 17 MCG/ACT  inhaler Inhale 1 puff into the lungs daily.   Yes [provider]  mirtazapine (REMERON) 30 MG tablet Take 1 tablet (30 mg total) by mouth at bedtime. 09/15/19  Yes Zena Amos, MD  QUEtiapine (SEROQUEL) 300 MG tablet Take 2 tablets at bedtime Patient taking differently: Take 600 mg by mouth at bedtime. Take 2 tablets at bedtime 09/15/19  Yes Zena Amos, MD  azithromycin (ZITHROMAX) 250 MG tablet Take 1 tablet (250 mg total) by mouth daily for 4 days. Take first 2 tablets together, then 1 every day until finished. 10/09/19 10/13/19  Sabino Donovan, MD  benzonatate (TESSALON) 100 MG capsule Take 1 capsule (100 mg total) by mouth every 8 (eight) hours. Patient not taking: Reported on 10/09/2019 08/14/19   Dahlia Byes A, NP  Cetirizine HCl 10 MG CAPS Take 1 capsule (10 mg total) by mouth daily. Patient not taking: Reported on 10/09/2019 08/14/19   Dahlia Byes A, NP  Gabapentin 10 % CREA gabapentin  400mg  Patient not taking: Reported on 10/09/2019    [provider]  Ipratropium-Albuterol (COMBIVENT RESPIMAT) 20-100 MCG/ACT AERS respimat Inhale 1 puff into the lungs every 6 (six) hours. Patient not taking: Reported on 10/09/2019 05/22/19   Darr, 05/24/19, PA-C  montelukast (SINGULAIR) 10 MG tablet Take 1 tablet (10 mg total) by mouth at bedtime. Patient not taking: Reported on 10/09/2019 09/03/19   Caccavale, Sophia, PA-C  predniSONE (DELTASONE) 10  MG tablet Take 4 tablets (40 mg total) by mouth daily for 4 days. 10/09/19 10/13/19  Sabino Donovan, MD  UNKNOWN TO PATIENT One HTN med; unk name    [provider]    Allergies    Patient has no known allergies.  Review of Systems   Review of Systems  Constitutional: Negative for chills and fever.  HENT: Positive for congestion. Negative for rhinorrhea.   Respiratory: Positive for cough. Negative for shortness of breath.   Cardiovascular: Negative for chest pain and palpitations.  Gastrointestinal: Negative for diarrhea, nausea and  vomiting.  Genitourinary: Negative for difficulty urinating and dysuria.  Musculoskeletal: Negative for arthralgias and back pain.  Skin: Negative for rash and wound.  Neurological: Positive for headaches (mild). Negative for light-headedness.    Physical Exam Updated Vital Signs BP (!) 179/126 (BP Location: Left Arm)   Pulse (!) 108   Temp 98.8 F (37.1 C) (Oral)   Resp 18   Ht 5\' 4"  (1.626 m)   Wt 63.5 kg   SpO2 100%   BMI 24.03 kg/m   Physical Exam Vitals and nursing note reviewed. Exam conducted with a chaperone present.  Constitutional:      General: She is not in acute distress.    Appearance: Normal appearance.  HENT:     Head: Normocephalic and atraumatic.     Nose: No rhinorrhea.  Eyes:     General:        Right eye: No discharge.        Left eye: No discharge.     Conjunctiva/sclera: Conjunctivae normal.  Cardiovascular:     Rate and Rhythm: Normal rate and regular rhythm.  Pulmonary:     Effort: Pulmonary effort is normal. No respiratory distress.     Breath sounds: No stridor. No decreased breath sounds, wheezing or rhonchi.  Abdominal:     General: Abdomen is flat. There is no distension.     Palpations: Abdomen is soft.  Musculoskeletal:        General: No tenderness or signs of injury.  Skin:    General: Skin is warm and dry.  Neurological:     General: No focal deficit present.     Mental Status: She is alert. Mental status is at baseline.     Motor: No weakness.  Psychiatric:        Mood and Affect: Mood normal.        Behavior: Behavior normal.     ED Results / Procedures / Treatments   Labs (all labs ordered are listed, but only abnormal results are displayed) Labs Reviewed  POC SARS CORONAVIRUS 2 AG -  ED    EKG None  Radiology DG Chest 2 View  Result Date: 10/09/2019 CLINICAL DATA:  Shortness of breath, hypertension EXAM: CHEST - 2 VIEW COMPARISON:  09/03/2019 FINDINGS: The heart size and mediastinal contours are within normal  limits. Chronic mild right apical scarring. Both lungs are clear. The visualized skeletal structures are unremarkable. IMPRESSION: No active cardiopulmonary disease. Electronically Signed   By: 11/03/2019 D.O.   On: 10/09/2019 12:04    Procedures Procedures (including critical care time)  Medications Ordered in ED Medications  albuterol (VENTOLIN HFA) 108 (90 Base) MCG/ACT inhaler 2 puff (2 puffs Inhalation Given 10/09/19 1946)  predniSONE (DELTASONE) tablet 40 mg (40 mg Oral Given 10/09/19 1947)  azithromycin (ZITHROMAX) tablet 500 mg (500 mg Oral Given 10/09/19 1947)  acetaminophen (TYLENOL) tablet 1,000 mg (1,000 mg Oral Given 10/09/19  2111)    ED Course  I have reviewed the triage vital signs and the nursing notes.  Pertinent labs & imaging results that were available during my care of the patient were reviewed by me and considered in my medical decision making (see chart for details).    MDM Rules/Calculators/A&P                          59 year old female with history of chronic bronchitis as a flare for cough congestion.  Clear sputum.  No fevers chills.  Chest x-ray reviewed by myself and radiology shows no acute cardiopulmonary pathology.  She has no focal lung sounds on exam, no significant wheeze, however she is required treatment for this in the past to include steroids.  She will get azithromycin and steroids and a albuterol inhaler.  She will get a Covid test.  She is well-appearing otherwise tolerating p.o.  Denies any chest pain significant shortness of breath other than when she has coughing episodes.  Her vital signs are stable she is afebrile here.  She will get a Covid test as well.  CXR reviewed by me and radiology shows no acute cardiac or pulmonary abnormalities.  Covid testing is negative.  Patient has received her antibiotics steroids and breathing treatments and she is feeling much better.  She knows she is needs outpatient management of multiple chronic  conditions.  She does not have outpatient providers.  These were provided via ambulatory referral.  She has no other concerns or questions.  She feels safe for discharge home.  Final Clinical Impression(s) / ED Diagnoses Final diagnoses:  COPD exacerbation (HCC)    Rx / DC Orders ED Discharge Orders         Ordered    predniSONE (DELTASONE) 10 MG tablet  Daily     Discontinue  Reprint     10/09/19 2038    azithromycin (ZITHROMAX) 250 MG tablet  Daily     Discontinue  Reprint     10/09/19 2038    Ambulatory referral to Family Practice     Discontinue  Reprint     10/09/19 2039           Sabino Donovan, MD 10/10/19 [phone]

## 2019-10-16 ENCOUNTER — Ambulatory Visit (INDEPENDENT_AMBULATORY_CARE_PROVIDER_SITE_OTHER): Payer: No Payment, Other | Admitting: Clinical

## 2019-10-16 ENCOUNTER — Other Ambulatory Visit: Payer: Self-pay

## 2019-10-16 DIAGNOSIS — F3341 Major depressive disorder, recurrent, in partial remission: Secondary | ICD-10-CM
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
## 2019-11-12 ENCOUNTER — Telehealth: Payer: Self-pay | Admitting: Internal Medicine

## 2019-11-13 ENCOUNTER — Ambulatory Visit (INDEPENDENT_AMBULATORY_CARE_PROVIDER_SITE_OTHER): Payer: No Payment, Other | Admitting: Clinical

## 2019-11-13 ENCOUNTER — Other Ambulatory Visit: Payer: Self-pay

## 2019-11-13 DIAGNOSIS — F3341 Major depressive disorder, recurrent, in partial remission: Secondary | ICD-10-CM
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
## 2019-11-16 ENCOUNTER — Ambulatory Visit (HOSPITAL_COMMUNITY): Payer: Self-pay | Admitting: Psychiatry

## 2019-11-21 ENCOUNTER — Encounter (HOSPITAL_COMMUNITY): Payer: Self-pay

## 2019-11-21 ENCOUNTER — Other Ambulatory Visit: Payer: Self-pay

## 2019-11-21 ENCOUNTER — Ambulatory Visit (HOSPITAL_COMMUNITY)
Admission: EM | Admit: 2019-11-21 | Discharge: 2019-11-21 | Disposition: A | Discharge status: Home / Self Care | Payer: Self-pay | Attending: Emergency Medicine | Admitting: Emergency Medicine

## 2019-11-21 DIAGNOSIS — R05 Cough: Secondary | ICD-10-CM | POA: Insufficient documentation

## 2019-11-21 DIAGNOSIS — Z20822 Contact with and (suspected) exposure to covid-19: Secondary | ICD-10-CM | POA: Insufficient documentation

## 2019-11-21 DIAGNOSIS — Z79899 Other long term (current) drug therapy: Secondary | ICD-10-CM | POA: Insufficient documentation

## 2019-11-21 DIAGNOSIS — R1110 Vomiting, unspecified: Secondary | ICD-10-CM | POA: Insufficient documentation

## 2019-11-21 DIAGNOSIS — I10 Essential (primary) hypertension: Secondary | ICD-10-CM | POA: Insufficient documentation

## 2019-11-21 DIAGNOSIS — R059 Cough, unspecified: Secondary | ICD-10-CM

## 2019-11-21 MED ORDER — ALBUTEROL SULFATE HFA 108 (90 BASE) MCG/ACT IN AERS: 1.0000 | INHALATION_SPRAY | Freq: Four times a day (QID) | RESPIRATORY_TRACT | 0 refills | Status: DC | PRN

## 2019-11-21 NOTE — ED Provider Notes (Signed)
[facility]    CSN: [id number] Arrival date & time: 11/21/19  1014      History   Chief Complaint Chief Complaint  Patient presents with  . Cough  . Emesis    HPI Nicole Cunningham is a 59 y.o. female.   Patient presents with ongoing cough x several months.  She states sometimes the cough is productive of yellow or brown phlegm.  She denies fever, chills, chest pain, shortness of breath, abdominal pain, diarrhea, or other symptoms.  Patient states she needs a refill on her albuterol inhaler.  She was seen at the ED on 10/09/2019; diagnosed with COPD exacerbation; chest x-ray negative; treated with prednisone and Zithromax; COVID negative.  Patient's medical history is significant for hypertension, bronchitis, COPD.  The history is provided by the patient.    Past Medical History:  Diagnosis Date  . Bronchitis   . Hypertension     Patient Active Problem List   Diagnosis Date Noted  . Major depressive disorder, recurrent episode, moderate (HCC) 09/07/2019    History reviewed. No pertinent surgical history.  OB History   No obstetric history on file.      Home Medications    Prior to Admission medications   Medication Sig Start Date End Date Taking? Authorizing Provider  albuterol (VENTOLIN HFA) 108 (90 Base) MCG/ACT inhaler Inhale 1-2 puffs into the lungs every 6 (six) hours as needed for wheezing or shortness of breath. 11/21/19   Mickie Bail, NP  benzonatate (TESSALON) 100 MG capsule Take 1 capsule (100 mg total) by mouth every 8 (eight) hours. Patient not taking: Reported on 10/09/2019 08/14/19   Dahlia Byes A, NP  Cetirizine HCl 10 MG CAPS Take 1 capsule (10 mg total) by mouth daily. Patient not taking: Reported on 10/09/2019 08/14/19   Dahlia Byes A, NP  FLUoxetine (PROZAC) 40 MG capsule Take 1 capsule (40 mg total) by mouth daily. 09/15/19   Zena Amos, MD  gabapentin (NEURONTIN) 400 MG capsule Take 1 capsule (400 mg total) by mouth at bedtime. 09/15/19    Zena Amos, MD  Gabapentin 10 % CREA gabapentin  400mg  Patient not taking: Reported on 10/09/2019    [provider]  ipratropium (ATROVENT HFA) 17 MCG/ACT inhaler Inhale 1 puff into the lungs daily.    [provider]  Ipratropium-Albuterol (COMBIVENT RESPIMAT) 20-100 MCG/ACT AERS respimat Inhale 1 puff into the lungs every 6 (six) hours. Patient not taking: Reported on 10/09/2019 05/22/19   Darr, 05/24/19, PA-C  mirtazapine (REMERON) 30 MG tablet Take 1 tablet (30 mg total) by mouth at bedtime. 09/15/19   09/17/19, MD  montelukast (SINGULAIR) 10 MG tablet Take 1 tablet (10 mg total) by mouth at bedtime. Patient not taking: Reported on 10/09/2019 09/03/19   Caccavale, Sophia, PA-C  QUEtiapine (SEROQUEL) 300 MG tablet Take 2 tablets at bedtime Patient taking differently: Take 600 mg by mouth at bedtime. Take 2 tablets at bedtime 09/15/19   09/17/19, MD  UNKNOWN TO PATIENT One HTN med; unk name    [provider]    Family History Family History  Problem Relation Age of Onset  . Diabetes Mother   . Diabetes Father     Social History Social History   Tobacco Use  . Smoking status: Never Smoker  . Smokeless tobacco: Never Used  Vaping Use  . Vaping Use: Never used  Substance Use Topics  . Alcohol use: Not Currently  . Drug use: Never  Allergies   Patient has no known allergies.   Review of Systems Review of Systems  Constitutional: Negative for chills and fever.  HENT: Negative for ear pain and sore throat.   Eyes: Negative for pain and visual disturbance.  Respiratory: Positive for cough. Negative for shortness of breath.   Cardiovascular: Negative for chest pain and palpitations.  Gastrointestinal: Negative for abdominal pain, diarrhea and vomiting.  Genitourinary: Negative for dysuria and hematuria.  Musculoskeletal: Negative for arthralgias and back pain.  Skin: Negative for color change and rash.  Neurological: Negative for  seizures and syncope.  All other systems reviewed and are negative.    Physical Exam Triage Vital Signs ED Triage Vitals  Enc Vitals Group     BP 11/21/19 1136 (!) 160/96     Pulse Rate 11/21/19 1136 (!) 116     Resp 11/21/19 1136 16     Temp 11/21/19 1136 99 F (37.2 C)     Temp Source 11/21/19 1136 Oral     SpO2 11/21/19 1136 100 %     Weight --      Height --      Head Circumference --      Peak Flow --      Pain Score 11/21/19 1137 0     Pain Loc --      Pain Edu? --      Excl. in GC? --    No data found.  Updated Vital Signs BP (!) 160/96 (BP Location: Left Arm)   Pulse (!) 116   Temp 99 F (37.2 C) (Oral)   Resp 16   SpO2 100%   Visual Acuity Right Eye Distance:   Left Eye Distance:   Bilateral Distance:    Right Eye Near:   Left Eye Near:    Bilateral Near:     Physical Exam Vitals and nursing note reviewed.  Constitutional:      General: She is not in acute distress.    Appearance: She is well-developed.  HENT:     Head: Normocephalic and atraumatic.     Mouth/Throat:     Mouth: Mucous membranes are moist.  Eyes:     Conjunctiva/sclera: Conjunctivae normal.  Cardiovascular:     Rate and Rhythm: Normal rate and regular rhythm.     Heart sounds: No murmur heard.   Pulmonary:     Effort: Pulmonary effort is normal. No respiratory distress.     Breath sounds: Normal breath sounds. No wheezing or rhonchi.  Abdominal:     Palpations: Abdomen is soft.     Tenderness: There is no abdominal tenderness.  Musculoskeletal:     Cervical back: Neck supple.  Skin:    General: Skin is warm and dry.     Findings: No rash.  Neurological:     General: No focal deficit present.     Mental Status: She is alert and oriented to person, place, and time.     Gait: Gait normal.      UC Treatments / Results  Labs (all labs ordered are listed, but only abnormal results are displayed) Labs Reviewed  SARS CORONAVIRUS 2 (TAT 6-24 HRS)     EKG   Radiology No results found.  Procedures Procedures (including critical care time)  Medications Ordered in UC Medications - No data to display  Initial Impression / Assessment and Plan / UC Course  I have reviewed the triage vital signs and the nursing notes.  Pertinent labs & imaging results that were  available during my care of the patient were reviewed by me and considered in my medical decision making (see chart for details).   Cough.  Refill on albuterol inhaler prescribed.  PCR COVID pending.  Instructed patient to self quarantine until the test result is back.  Instructed her to follow-up with her PCP next week.  Instructed her to go to the ED if she has acute worsening symptoms.  Patient agrees to plan of care.   Final Clinical Impressions(s) / UC Diagnoses   Final diagnoses:  Cough     Discharge Instructions     A refill on your albuterol inhaler was called in for you.  Your COVID test is pending.  You should self quarantine until the test result is back.    Take Tylenol as needed for fever or discomfort.  Rest and keep yourself hydrated.    Go to the emergency department if you develop acute worsening symptoms.        ED Prescriptions    Medication Sig Dispense Auth. Provider   albuterol (VENTOLIN HFA) 108 (90 Base) MCG/ACT inhaler Inhale 1-2 puffs into the lungs every 6 (six) hours as needed for wheezing or shortness of breath. 18 g Mickie Bail, NP     PDMP not reviewed this encounter.   Mickie Bail, NP 11/21/19 1154

## 2019-11-21 NOTE — ED Triage Notes (Signed)
Pt present coughing with vomiting, symptom been going on for a while per pt. She has been vomiting up her mucous when she cough real hard

## 2019-11-21 NOTE — Discharge Instructions (Signed)
A refill on your albuterol inhaler was called in for you.  Your COVID test is pending.  You should self quarantine until the test result is back.    Take Tylenol as needed for fever or discomfort.  Rest and keep yourself hydrated.    Go to the emergency department if you develop acute worsening symptoms.

## 2019-11-22 LAB — SARS CORONAVIRUS 2 (TAT 6-24 HRS): SARS Coronavirus 2: NEGATIVE

## 2019-12-03 ENCOUNTER — Encounter (HOSPITAL_COMMUNITY): Payer: Self-pay | Admitting: Emergency Medicine

## 2019-12-03 ENCOUNTER — Ambulatory Visit (HOSPITAL_COMMUNITY)
Admission: EM | Admit: 2019-12-03 | Discharge: 2019-12-03 | Disposition: A | Discharge status: Home / Self Care | Payer: Self-pay | Attending: Emergency Medicine | Admitting: Emergency Medicine

## 2019-12-03 ENCOUNTER — Other Ambulatory Visit: Payer: Self-pay

## 2019-12-03 DIAGNOSIS — Z79899 Other long term (current) drug therapy: Secondary | ICD-10-CM | POA: Insufficient documentation

## 2019-12-03 DIAGNOSIS — Z7952 Long term (current) use of systemic steroids: Secondary | ICD-10-CM | POA: Insufficient documentation

## 2019-12-03 DIAGNOSIS — I10 Essential (primary) hypertension: Secondary | ICD-10-CM | POA: Insufficient documentation

## 2019-12-03 DIAGNOSIS — J441 Chronic obstructive pulmonary disease with (acute) exacerbation: Secondary | ICD-10-CM | POA: Insufficient documentation

## 2019-12-03 DIAGNOSIS — Z20822 Contact with and (suspected) exposure to covid-19: Secondary | ICD-10-CM | POA: Insufficient documentation

## 2019-12-03 HISTORY — DX: Chronic obstructive pulmonary disease, unspecified: J44.9

## 2019-12-03 MED ORDER — BENZONATATE 200 MG PO CAPS: 200.0000 mg | ORAL_CAPSULE | Freq: Three times a day (TID) | ORAL | 0 refills | Status: DC | PRN

## 2019-12-03 MED ORDER — ALBUTEROL SULFATE HFA 108 (90 BASE) MCG/ACT IN AERS: 1.0000 | INHALATION_SPRAY | Freq: Four times a day (QID) | RESPIRATORY_TRACT | 0 refills | Status: DC | PRN

## 2019-12-03 MED ORDER — COMBIVENT RESPIMAT 20-100 MCG/ACT IN AERS: 1.0000 | INHALATION_SPRAY | Freq: Four times a day (QID) | RESPIRATORY_TRACT | 1 refills | Status: DC

## 2019-12-03 MED ORDER — PREDNISONE 10 MG PO TABS: ORAL_TABLET | ORAL | 0 refills | Status: DC

## 2019-12-03 NOTE — ED Triage Notes (Signed)
Patient was seen 10/26/19,  Patient reports she did fee somewhat better.  Then yesterday started feeling particularly bad again.  Complains of sob, cough, productive cough-phlegm is thick, yellow color.  Denies fever.  Patient has a runny nose

## 2019-12-03 NOTE — ED Provider Notes (Signed)
[facility]    CSN: [id number] Arrival date & time: 12/03/19  1439      History   Chief Complaint Chief Complaint  Patient presents with  . Shortness of Breath    HPI Nicole Cunningham is a 59 y.o. female.   Started 2 days ago with productive cough, chest tightness, SOB. Also having body aches, scratchy throat. Denies fever, N/V/D, chills, CP. Hx of COPD, states she tends to run out of her inhalers every now and again. Sees a primary care provider next week to help get her COPD under better control.      Past Medical History:  Diagnosis Date  . Bronchitis   . COPD (chronic obstructive pulmonary disease) (HCC)   . Hypertension     Patient Active Problem List   Diagnosis Date Noted  . Major depressive disorder, recurrent episode, moderate (HCC) 09/07/2019    History reviewed. No pertinent surgical history.  OB History   No obstetric history on file.      Home Medications    Prior to Admission medications   Medication Sig Start Date End Date Taking? Authorizing Provider  Cetirizine HCl 10 MG CAPS Take 1 capsule (10 mg total) by mouth daily. 08/14/19  Yes Bast, Traci A, NP  FLUoxetine (PROZAC) 40 MG capsule Take 1 capsule (40 mg total) by mouth daily. 09/15/19  Yes Zena Amos, MD  gabapentin (NEURONTIN) 400 MG capsule Take 1 capsule (400 mg total) by mouth at bedtime. 09/15/19  Yes Zena Amos, MD  mirtazapine (REMERON) 30 MG tablet Take 1 tablet (30 mg total) by mouth at bedtime. 09/15/19  Yes Zena Amos, MD  QUEtiapine (SEROQUEL) 300 MG tablet Take 2 tablets at bedtime Patient taking differently: Take 600 mg by mouth at bedtime. Take 2 tablets at bedtime 09/15/19  Yes Zena Amos, MD  albuterol (VENTOLIN HFA) 108 (90 Base) MCG/ACT inhaler Inhale 1-2 puffs into the lungs every 6 (six) hours as needed for wheezing or shortness of breath. 12/03/19   Particia Nearing, PA-C  benzonatate (TESSALON) 200 MG capsule Take 1 capsule (200 mg total) by mouth  3 (three) times daily as needed for cough. 12/03/19   Particia Nearing, PA-C  Gabapentin 10 % CREA gabapentin  400mg  Patient not taking: Reported on 10/09/2019    [provider]  ipratropium (ATROVENT HFA) 17 MCG/ACT inhaler Inhale 1 puff into the lungs daily.    [provider]  Ipratropium-Albuterol (COMBIVENT RESPIMAT) 20-100 MCG/ACT AERS respimat Inhale 1 puff into the lungs every 6 (six) hours. 12/03/19   02/02/20, PA-C  montelukast (SINGULAIR) 10 MG tablet Take 1 tablet (10 mg total) by mouth at bedtime. Patient not taking: Reported on 10/09/2019 09/03/19   Caccavale, Sophia, PA-C  predniSONE (DELTASONE) 10 MG tablet Take 6 tabs day one, 5 tabs day two, 4 tabs day three, etc 12/03/19   02/02/20, PA-C  UNKNOWN TO PATIENT One HTN med; unk name    [provider]    Family History Family History  Problem Relation Age of Onset  . Diabetes Mother   . Diabetes Father     Social History Social History   Tobacco Use  . Smoking status: Never Smoker  . Smokeless tobacco: Never Used  Vaping Use  . Vaping Use: Never used  Substance Use Topics  . Alcohol use: Not Currently  . Drug use: Never     Allergies   Patient has no known allergies.   Review of  Systems Review of Systems PER HPI   Physical Exam Triage Vital Signs ED Triage Vitals  Enc Vitals Group     BP 12/03/19 1721 (!) 179/97     Pulse Rate 12/03/19 1721 (!) 104     Resp 12/03/19 1721 18     Temp 12/03/19 1721 99.1 F (37.3 C)     Temp Source 12/03/19 1721 Oral     SpO2 12/03/19 1721 99 %     Weight --      Height --      Head Circumference --      Peak Flow --      Pain Score 12/03/19 1717 10     Pain Loc --      Pain Edu? --      Excl. in GC? --    No data found.  Updated Vital Signs BP (!) 179/97 (BP Location: Left Arm) Comment: checked bp x 2  Pulse (!) 104   Temp 99.1 F (37.3 C) (Oral)   Resp 18   SpO2 99%   Visual Acuity Right Eye  Distance:   Left Eye Distance:   Bilateral Distance:    Right Eye Near:   Left Eye Near:    Bilateral Near:     Physical Exam Vitals and nursing note reviewed.  Constitutional:      Appearance: Normal appearance. She is not ill-appearing.  HENT:     Head: Atraumatic.     Right Ear: Tympanic membrane normal.     Left Ear: Tympanic membrane normal.     Nose: Rhinorrhea present.     Mouth/Throat:     Mouth: Mucous membranes are moist.     Pharynx: Posterior oropharyngeal erythema present.  Eyes:     Extraocular Movements: Extraocular movements intact.     Conjunctiva/sclera: Conjunctivae normal.  Cardiovascular:     Rate and Rhythm: Normal rate and regular rhythm.     Heart sounds: Normal heart sounds.  Pulmonary:     Effort: Pulmonary effort is normal.     Breath sounds: Wheezing (minimal diffuse wheezes) present. No rales.     Comments: Decreased breath sounds throughout Chest:     Chest wall: No tenderness.  Abdominal:     General: Bowel sounds are normal. There is no distension.     Palpations: Abdomen is soft.     Tenderness: There is no abdominal tenderness.  Musculoskeletal:        General: Normal range of motion.     Cervical back: Normal range of motion and neck supple.  Skin:    General: Skin is warm and dry.  Neurological:     Mental Status: She is alert and oriented to person, place, and time.  Psychiatric:        Mood and Affect: Mood normal.        Thought Content: Thought content normal.        Judgment: Judgment normal.      UC Treatments / Results  Labs (all labs ordered are listed, but only abnormal results are displayed) Labs Reviewed  SARS CORONAVIRUS 2 (TAT 6-24 HRS)    EKG   Radiology No results found.  Procedures Procedures (including critical care time)  Medications Ordered in UC Medications - No data to display  Initial Impression / Assessment and Plan / UC Course  I have reviewed the triage vital signs and the nursing  notes.  Pertinent labs & imaging results that were available during my care of the patient  were reviewed by me and considered in my medical decision making (see chart for details).     Overall very well appearing, breathing is unlabored, O2 sats 99% and afebrile. Lungs without rales, and patient declines CXR today. Will refill her current inhalers, treat with prednisone taper and tessalon perles. Follow up next with with PCP to re-evaluate COPD inhaler regimen given her persistent exacerbations. Isolate until COVID 19 results return and discussed quarantine protocol if positive.   Final Clinical Impressions(s) / UC Diagnoses   Final diagnoses:  COPD exacerbation [facility])     Discharge Instructions     Follow up next week as scheduled with your primary care provider for re-check on your breathing    ED Prescriptions    Medication Sig Dispense Auth. Provider   Ipratropium-Albuterol (COMBIVENT RESPIMAT) 20-100 MCG/ACT AERS respimat Inhale 1 puff into the lungs every 6 (six) hours. 4 g Particia Nearing, PA-C   albuterol (VENTOLIN HFA) 108 (90 Base) MCG/ACT inhaler Inhale 1-2 puffs into the lungs every 6 (six) hours as needed for wheezing or shortness of breath. 18 g Particia Nearing, [place]   predniSONE (DELTASONE) 10 MG tablet Take 6 tabs day one, 5 tabs day two, 4 tabs day three, etc 21 tablet Particia Nearing, PA-C   benzonatate (TESSALON) 200 MG capsule Take 1 capsule (200 mg total) by mouth 3 (three) times daily as needed for cough. 20 capsule Particia Nearing, [place]     PDMP not reviewed this encounter.   Particia Nearing, [place] 12/03/19 2049

## 2019-12-03 NOTE — Discharge Instructions (Signed)
Follow up next week as scheduled with your primary care provider for re-check on your breathing

## 2019-12-03 NOTE — ED Notes (Signed)
Nasal swab in lab 

## 2019-12-04 LAB — SARS CORONAVIRUS 2 (TAT 6-24 HRS): SARS Coronavirus 2: NEGATIVE

## 2019-12-10 ENCOUNTER — Telehealth (INDEPENDENT_AMBULATORY_CARE_PROVIDER_SITE_OTHER): Payer: Self-pay | Admitting: Internal Medicine

## 2019-12-10 DIAGNOSIS — J441 Chronic obstructive pulmonary disease with (acute) exacerbation: Secondary | ICD-10-CM

## 2019-12-10 DIAGNOSIS — Z7689 Persons encountering health services in other specified circumstances: Secondary | ICD-10-CM

## 2019-12-10 MED ORDER — FLUTICASONE FUROATE-VILANTEROL 200-25 MCG/INH IN AEPB: 1.0000 | INHALATION_SPRAY | Freq: Every day | RESPIRATORY_TRACT | 3 refills | Status: DC

## 2019-12-10 MED FILL — !BREO ELLIPTA 200-25 MCG: 200-25 | 30 days supply | Qty: 60 | Fill #0

## 2019-12-10 NOTE — Progress Notes (Signed)
Virtual Visit via Telephone Note  I connected with Nicole Cunningham, on 12/10/2019 at 9:35 AM by telephone due to the COVID-19 pandemic and verified that I am speaking with the correct person using two identifiers.   Consent: I discussed the limitations, risks, security and privacy concerns of performing an evaluation and management service by telephone and the availability of in person appointments. I also discussed with the patient that there may be a patient responsible charge related to this service. The patient expressed understanding and agreed to proceed.   Location of Patient: Home   Location of Provider: Clinic    Persons participating in Telemedicine visit: Mischell Branford [facility] Dr. Earlene Plater      History of Present Illness: Patient has a visit to establish care. Has a PMH of depression, COPD. No surgical history.   Patient was seen in ED for COPD on 9/9. Patient was given Prednisone, reports she is still taking them. For her COPD, she is just using a rescue inhaler. Has never been on an inhaler that she uses daily. Patient reports that she is having an average of 3-4 COPD exacerbations per year. Has never had to be hospitalizaed for COPD. Reports she will get samples of inhalers or steroids and be able to come back home. Has never been seen by pulmonology. Not current smoker. Has felt some improvement since going to ED but still having to use the inhaler multiple times per day.    Past Medical History:  Diagnosis Date  . Bronchitis   . COPD (chronic obstructive pulmonary disease) (HCC)   . Hypertension    No Known Allergies  Current Outpatient Medications on File Prior to Visit  Medication Sig Dispense Refill  . albuterol (VENTOLIN HFA) 108 (90 Base) MCG/ACT inhaler Inhale 1-2 puffs into the lungs every 6 (six) hours as needed for wheezing or shortness of breath. 18 g 0  . benzonatate (TESSALON) 200 MG capsule Take 1 capsule (200 mg total) by mouth 3  (three) times daily as needed for cough. 20 capsule 0  . Cetirizine HCl 10 MG CAPS Take 1 capsule (10 mg total) by mouth daily. 15 capsule 0  . FLUoxetine (PROZAC) 40 MG capsule Take 1 capsule (40 mg total) by mouth daily. 30 capsule 1  . gabapentin (NEURONTIN) 400 MG capsule Take 1 capsule (400 mg total) by mouth at bedtime. 30 capsule 1  . Ipratropium-Albuterol (COMBIVENT RESPIMAT) 20-100 MCG/ACT AERS respimat Inhale 1 puff into the lungs every 6 (six) hours. 4 g 1  . mirtazapine (REMERON) 30 MG tablet Take 1 tablet (30 mg total) by mouth at bedtime. 30 tablet 1  . QUEtiapine (SEROQUEL) 300 MG tablet Take 2 tablets at bedtime (Patient taking differently: Take 600 mg by mouth at bedtime. Take 2 tablets at bedtime) 60 tablet 1  . predniSONE (DELTASONE) 10 MG tablet Take 6 tabs day one, 5 tabs day two, 4 tabs day three, etc 21 tablet 0   No current facility-administered medications on file prior to visit.    Observations/Objective: NAD. Speaking clearly.  Work of breathing normal.  Alert and oriented. Mood appropriate.   Assessment and Plan: 1. Encounter to establish care Reviewed patient's PMH, social history, surgical history, and medications.  Is overdue for annual exam, screening blood work, and health maintenance topics. Have asked patient to return for visit to address these items.   2. COPD exacerbation (HCC) Improving but still with significant COPD symptoms. Suspect patient would benefit from controller medication given  symptom level and multiple exacerbations per year. Given higher risk, will use LABA and ICS. Also choosing this combination as it is what is on the formulary at [facility] pharmacy and patient is a self pay patient. Patient to complete course of Prednisone. Discussed reasons to seek emergency care.F/u with Dr. Delford Field next week for close monitoring and additional medication management.  - fluticasone furoate-vilanterol (BREO ELLIPTA) 200-25 MCG/INH AEPB; Inhale 1 puff into  the lungs daily.  Dispense: 28 each; Refill: 3   Follow Up Instructions: Dr. Delford Field; annual exam needed    I discussed the assessment and treatment plan with the patient. The patient was provided an opportunity to ask questions and all were answered. The patient agreed with the plan and demonstrated an understanding of the instructions.   The patient was advised to call back or seek an in-person evaluation if the symptoms worsen or if the condition fails to improve as anticipated.     I provided 32 minutes total of non-face-to-face time during this encounter including median intraservice time, reviewing previous notes, investigations, ordering medications, medical decision making, coordinating care and patient verbalized understanding at the end of the visit.    Marcy Siren, D.O. Primary Care at [facility]  12/10/2019, 9:35 AM

## 2019-12-14 ENCOUNTER — Ambulatory Visit (INDEPENDENT_AMBULATORY_CARE_PROVIDER_SITE_OTHER): Payer: No Payment, Other | Admitting: Psychiatry

## 2019-12-14 ENCOUNTER — Encounter (HOSPITAL_COMMUNITY): Payer: Self-pay | Admitting: Psychiatry

## 2019-12-14 ENCOUNTER — Other Ambulatory Visit: Payer: Self-pay

## 2019-12-14 DIAGNOSIS — F3342 Major depressive disorder, recurrent, in full remission: Secondary | ICD-10-CM | POA: Insufficient documentation

## 2019-12-14 DIAGNOSIS — F3341 Major depressive disorder, recurrent, in partial remission: Secondary | ICD-10-CM | POA: Diagnosis not present

## 2019-12-14 DIAGNOSIS — F419 Anxiety disorder, unspecified: Secondary | ICD-10-CM | POA: Insufficient documentation

## 2019-12-14 MED ORDER — GABAPENTIN 400 MG PO CAPS: 400.0000 mg | ORAL_CAPSULE | Freq: Every day | ORAL | 2 refills | Status: DC

## 2019-12-14 MED ORDER — QUETIAPINE FUMARATE 300 MG PO TABS: 600.0000 mg | ORAL_TABLET | Freq: Every day | ORAL | 2 refills | Status: DC

## 2019-12-14 MED ORDER — FLUOXETINE HCL 40 MG PO CAPS: 40.0000 mg | ORAL_CAPSULE | Freq: Every day | ORAL | 2 refills | Status: DC

## 2019-12-14 MED ORDER — MIRTAZAPINE 30 MG PO TABS: 30.0000 mg | ORAL_TABLET | Freq: Every day | ORAL | 2 refills | Status: DC

## 2019-12-14 NOTE — Progress Notes (Signed)
BH MD/PA/NP OP Progress Note  12/14/2019 2:08 PM Nicole Cunningham  MRN:  [id number]  Chief Complaint:  " I am doing well."  HPI: Patient reported that she is doing well.  She stated that she found increasing the dose of Prozac to be helpful.  She denied any more frequent crying spells or sadness.  She stated that her relationship with her husband is better now.  She denied any specific stressors at this time.  She reported that all her medications are helping.  She is sleeping well at night.  She denied any side effects her regimen.  Visit Diagnosis:    ICD-10-CM   1. MDD (major depressive disorder), recurrent, in full remission (HCC)  F33.42   2. Anxiety disorder, unspecified type  F41.9     Past Psychiatric History: MDD, anxiety   Past Medical History:  Past Medical History:  Diagnosis Date  . Bronchitis   . COPD (chronic obstructive pulmonary disease) (HCC)   . Hypertension    No past surgical history on file.  Family Psychiatric History: denied  Family History:  Family History  Problem Relation Age of Onset  . Diabetes Mother   . Diabetes Father     Social History:  Social History   Socioeconomic History  . Marital status: Married    Spouse name: Not on file  . Number of children: Not on file  . Years of education: Not on file  . Highest education level: Not on file  Occupational History  . Not on file  Tobacco Use  . Smoking status: Never Smoker  . Smokeless tobacco: Never Used  Vaping Use  . Vaping Use: Never used  Substance and Sexual Activity  . Alcohol use: Not Currently  . Drug use: Never  . Sexual activity: Not Currently  Other Topics Concern  . Not on file  Social History Narrative  . Not on file   Social Determinants of Health   Financial Resource Strain:   . Difficulty of Paying Living Expenses: Not on file  Food Insecurity:   . Worried About Programme researcher, broadcasting/film/video in the Last Year: Not on file  . Ran Out of Food in the Last Year: Not on  file  Transportation Needs:   . Lack of Transportation (Medical): Not on file  . Lack of Transportation (Non-Medical): Not on file  Physical Activity:   . Days of Exercise per Week: Not on file  . Minutes of Exercise per Session: Not on file  Stress:   . Feeling of Stress : Not on file  Social Connections:   . Frequency of Communication with Friends and Family: Not on file  . Frequency of Social Gatherings with Friends and Family: Not on file  . Attends Religious Services: Not on file  . Active Member of Clubs or Organizations: Not on file  . Attends Banker Meetings: Not on file  . Marital Status: Not on file    Allergies: No Known Allergies  Metabolic Disorder Labs: No results found for: HGBA1C, MPG No results found for: PROLACTIN No results found for: CHOL, TRIG, HDL, CHOLHDL, VLDL, LDLCALC No results found for: TSH  Therapeutic Level Labs: No results found for: LITHIUM No results found for: VALPROATE No components found for:  CBMZ  Current Medications: Current Outpatient Medications  Medication Sig Dispense Refill  . albuterol (VENTOLIN HFA) 108 (90 Base) MCG/ACT inhaler Inhale 1-2 puffs into the lungs every 6 (six) hours as needed for wheezing or shortness of  breath. 18 g 0  . benzonatate (TESSALON) 200 MG capsule Take 1 capsule (200 mg total) by mouth 3 (three) times daily as needed for cough. 20 capsule 0  . Cetirizine HCl 10 MG CAPS Take 1 capsule (10 mg total) by mouth daily. 15 capsule 0  . FLUoxetine (PROZAC) 40 MG capsule Take 1 capsule (40 mg total) by mouth daily. 30 capsule 1  . fluticasone furoate-vilanterol (BREO ELLIPTA) 200-25 MCG/INH AEPB Inhale 1 puff into the lungs daily. 28 each 3  . gabapentin (NEURONTIN) 400 MG capsule Take 1 capsule (400 mg total) by mouth at bedtime. 30 capsule 1  . Ipratropium-Albuterol (COMBIVENT RESPIMAT) 20-100 MCG/ACT AERS respimat Inhale 1 puff into the lungs every 6 (six) hours. 4 g 1  . mirtazapine (REMERON) 30  MG tablet Take 1 tablet (30 mg total) by mouth at bedtime. 30 tablet 1  . predniSONE (DELTASONE) 10 MG tablet Take 6 tabs day one, 5 tabs day two, 4 tabs day three, etc 21 tablet 0  . QUEtiapine (SEROQUEL) 300 MG tablet Take 2 tablets at bedtime (Patient taking differently: Take 600 mg by mouth at bedtime. Take 2 tablets at bedtime) 60 tablet 1   No current facility-administered medications for this visit.     Musculoskeletal: Strength & Muscle Tone: within normal limits Gait & Station: normal Patient leans: N/A  Psychiatric Specialty Exam: Review of Systems  There were no vitals taken for this visit.There is no height or weight on file to calculate BMI.  General Appearance: Well Groomed  Eye Contact:  Good  Speech:  Clear and Coherent and Normal Rate  Volume:  Normal  Mood:  Euthymic  Affect:  Congruent  Thought Process:  Goal Directed and Descriptions of Associations: Intact  Orientation:  Full (Time, Place, and Person)  Thought Content: Logical   Suicidal Thoughts:  No  Homicidal Thoughts:  No  Memory:  Immediate;   Good Recent;   Good  Judgement:  Fair  Insight:  Fair  Psychomotor Activity:  Normal  Concentration:  Concentration: Good and Attention Span: Good  Recall:  Good  Fund of Knowledge: Good  Language: Good  Akathisia:  Negative  Handed:  Right  AIMS (if indicated):not done  Assets:  Communication Skills Desire for Improvement Financial Resources/Insurance Housing  ADL's:  Intact  Cognition: WNL  Sleep:  Good   Screenings: PHQ2-9     Counselor from 09/07/2019 in [facility]  PHQ-2 Total Score 6  PHQ-9 Total Score 10       Assessment and Plan: Patient is stable on her current regimen.  1. MDD (major depressive disorder), recurrent, in full remission (HCC) FLUoxetine (PROZAC) 40 MG capsule; Take 1 capsule (40 mg total) by mouth daily.  Dispense: 30 capsule; Refill: 2 - gabapentin (NEURONTIN) 400 MG capsule; Take 1  capsule (400 mg total) by mouth at bedtime.  Dispense: 30 capsule; Refill: 2 - mirtazapine (REMERON) 30 MG tablet; Take 1 tablet (30 mg total) by mouth at bedtime.  Dispense: 30 tablet; Refill: 2 - QUEtiapine (SEROQUEL) 300 MG tablet; Take 2 tablets (600 mg total) by mouth at bedtime. Take 2 tablets at bedtime  Dispense: 60 tablet; Refill: 2   2. Anxiety disorder, unspecified type - FLUoxetine (PROZAC) 40 MG capsule; Take 1 capsule (40 mg total) by mouth daily.  Dispense: 30 capsule; Refill: 2 - gabapentin (NEURONTIN) 400 MG capsule; Take 1 capsule (400 mg total) by mouth at bedtime.  Dispense: 30 capsule; Refill: 2  Continue same medication regimen. Follow up in 3 months.   Zena Amos, MD 12/14/2019, 2:08 PM

## 2019-12-15 ENCOUNTER — Other Ambulatory Visit: Payer: Self-pay | Admitting: Critical Care Medicine

## 2019-12-15 ENCOUNTER — Ambulatory Visit (INDEPENDENT_AMBULATORY_CARE_PROVIDER_SITE_OTHER): Payer: Self-pay | Admitting: Critical Care Medicine

## 2019-12-15 VITALS — BP 130/88 | HR 65 | Temp 97.3°F | Resp 17 | Ht 64.0 in | Wt 121.0 lb

## 2019-12-15 DIAGNOSIS — J449 Chronic obstructive pulmonary disease, unspecified: Secondary | ICD-10-CM

## 2019-12-15 DIAGNOSIS — I160 Hypertensive urgency: Secondary | ICD-10-CM

## 2019-12-15 DIAGNOSIS — F3342 Major depressive disorder, recurrent, in full remission: Secondary | ICD-10-CM

## 2019-12-15 DIAGNOSIS — Z114 Encounter for screening for human immunodeficiency virus [HIV]: Secondary | ICD-10-CM

## 2019-12-15 DIAGNOSIS — Z1211 Encounter for screening for malignant neoplasm of colon: Secondary | ICD-10-CM

## 2019-12-15 DIAGNOSIS — F419 Anxiety disorder, unspecified: Secondary | ICD-10-CM

## 2019-12-15 DIAGNOSIS — I10 Essential (primary) hypertension: Secondary | ICD-10-CM

## 2019-12-15 DIAGNOSIS — Z1159 Encounter for screening for other viral diseases: Secondary | ICD-10-CM

## 2019-12-15 DIAGNOSIS — J441 Chronic obstructive pulmonary disease with (acute) exacerbation: Secondary | ICD-10-CM

## 2019-12-15 DIAGNOSIS — J4489 Other specified chronic obstructive pulmonary disease: Secondary | ICD-10-CM | POA: Insufficient documentation

## 2019-12-15 DIAGNOSIS — Z1231 Encounter for screening mammogram for malignant neoplasm of breast: Secondary | ICD-10-CM

## 2019-12-15 MED ORDER — COMBIVENT RESPIMAT 20-100 MCG/ACT IN AERS
1.0000 | INHALATION_SPRAY | Freq: Four times a day (QID) | RESPIRATORY_TRACT | 1 refills | Status: DC
Start: 2019-12-15 — End: 2020-04-22

## 2019-12-15 MED ORDER — CLONIDINE HCL 0.1 MG PO TABS
0.1000 mg | ORAL_TABLET | Freq: Once | ORAL | Status: AC
  Administered 2019-12-15: 0.1 mg via ORAL

## 2019-12-15 MED ORDER — AMLODIPINE BESYLATE 10 MG PO TABS: 10.0000 mg | ORAL_TABLET | Freq: Every day | ORAL | 3 refills | Status: DC

## 2019-12-15 MED ORDER — CLONIDINE HCL 0.1 MG PO TABS
0.2000 mg | ORAL_TABLET | Freq: Once | ORAL | Status: AC
  Administered 2019-12-15: 0.2 mg via ORAL

## 2019-12-15 MED ORDER — LOSARTAN POTASSIUM-HCTZ 100-25 MG PO TABS: 1.0000 | ORAL_TABLET | Freq: Every day | ORAL | 3 refills | Status: DC

## 2019-12-15 NOTE — Assessment & Plan Note (Signed)
Major depression recurrent now in full remission per [facility]

## 2019-12-15 NOTE — Assessment & Plan Note (Signed)
Significant anxiety disorder managed by [facility] continue current therapies

## 2019-12-15 NOTE — Assessment & Plan Note (Signed)
Hypertensive urgency at this visit initial blood pressure 201/120 after 0.3 mg of clonidine over 1 hour.  Patient's blood pressure now in the 130/88 range  Plan now will be to begin losartan HCT 100/25 1 daily and amlodipine 10 mg daily  The patient was somewhat dry today could not obtain blood she will come back for complete metabolic profile and blood counts and lipid panel

## 2019-12-15 NOTE — Assessment & Plan Note (Signed)
Chronic obstructive lung disease with passive smoke exposure will continue Breo 1 puff a day Combivent 2 puffs 4 times a day patient is no longer being exposed to smoke at this time  Note patient does declined the tetanus shot however she has already received the Covid vaccine one-time dose will obtain hepatitis C screen

## 2019-12-15 NOTE — Patient Instructions (Addendum)
Start losartan HCT one daily  Start amlodipine one daily  Continue Breo and Combivent  Labs today:  Metabolic panel, blood counts, hiv and hepatitis C levels sent  A mammogram will be ordered  A stool kit will be given to check for blood in stool  Return one month for blood pressure check and pap smear with Dr Juleen China  Follow diet below:  Managing Your Hypertension Hypertension is commonly called high blood pressure. This is when the force of your blood pressing against the walls of your arteries is too strong. Arteries are blood vessels that carry blood from your heart throughout your body. Hypertension forces the heart to work harder to pump blood, and may cause the arteries to become narrow or stiff. Having untreated or uncontrolled hypertension can cause heart attack, stroke, kidney disease, and other problems. What are blood pressure readings? A blood pressure reading consists of a higher number over a lower number. Ideally, your blood pressure should be below 120/80. The first ("top") number is called the systolic pressure. It is a measure of the pressure in your arteries as your heart beats. The second ("bottom") number is called the diastolic pressure. It is a measure of the pressure in your arteries as the heart relaxes. What does my blood pressure reading mean? Blood pressure is classified into four stages. Based on your blood pressure reading, your health care provider may use the following stages to determine what type of treatment you need, if any. Systolic pressure and diastolic pressure are measured in a unit called mm Hg. Normal  Systolic pressure: below 532.  Diastolic pressure: below 80. Elevated  Systolic pressure: 992-426.  Diastolic pressure: below 80. Hypertension stage 1  Systolic pressure: 834-196.  Diastolic pressure: 22-29. Hypertension stage 2  Systolic pressure: 798 or above.  Diastolic pressure: 90 or above. What health risks are associated with  hypertension? Managing your hypertension is an important responsibility. Uncontrolled hypertension can lead to:  A heart attack.  A stroke.  A weakened blood vessel (aneurysm).  Heart failure.  Kidney damage.  Eye damage.  Metabolic syndrome.  Memory and concentration problems. What changes can I make to manage my hypertension? Hypertension can be managed by making lifestyle changes and possibly by taking medicines. Your health care provider will help you make a plan to bring your blood pressure within a normal range. Eating and drinking   Eat a diet that is high in fiber and potassium, and low in salt (sodium), added sugar, and fat. An example eating plan is called the DASH (Dietary Approaches to Stop Hypertension) diet. To eat this way: ? Eat plenty of fresh fruits and vegetables. Try to fill half of your plate at each meal with fruits and vegetables. ? Eat whole grains, such as whole wheat pasta, brown rice, or whole grain bread. Fill about one quarter of your plate with whole grains. ? Eat low-fat diary products. ? Avoid fatty cuts of meat, processed or cured meats, and poultry with skin. Fill about one quarter of your plate with lean proteins such as fish, chicken without skin, beans, eggs, and tofu. ? Avoid premade and processed foods. These tend to be higher in sodium, added sugar, and fat.  Reduce your daily sodium intake. Most people with hypertension should eat less than 1,500 mg of sodium a day.  Limit alcohol intake to no more than 1 drink a day for nonpregnant women and 2 drinks a day for men. One drink equals 12 oz of beer, 5  oz of wine, or 1 oz of hard liquor. Lifestyle  Work with your health care provider to maintain a healthy body weight, or to lose weight. Ask what an ideal weight is for you.  Get at least 30 minutes of exercise that causes your heart to beat faster (aerobic exercise) most days of the week. Activities may include walking, swimming, or  biking.  Include exercise to strengthen your muscles (resistance exercise), such as weight lifting, as part of your weekly exercise routine. Try to do these types of exercises for 30 minutes at least 3 days a week.  Do not use any products that contain nicotine or tobacco, such as cigarettes and e-cigarettes. If you need help quitting, ask your health care provider.  Control any long-term (chronic) conditions you have, such as high cholesterol or diabetes. Monitoring  Monitor your blood pressure at home as told by your health care provider. Your personal target blood pressure may vary depending on your medical conditions, your age, and other factors.  Have your blood pressure checked regularly, as often as told by your health care provider. Working with your health care provider  Review all the medicines you take with your health care provider because there may be side effects or interactions.  Talk with your health care provider about your diet, exercise habits, and other lifestyle factors that may be contributing to hypertension.  Visit your health care provider regularly. Your health care provider can help you create and adjust your plan for managing hypertension. Will I need medicine to control my blood pressure? Your health care provider may prescribe medicine if lifestyle changes are not enough to get your blood pressure under control, and if:  Your systolic blood pressure is 130 or higher.  Your diastolic blood pressure is 80 or higher. Take medicines only as told by your health care provider. Follow the directions carefully. Blood pressure medicines must be taken as prescribed. The medicine does not work as well when you skip doses. Skipping doses also puts you at risk for problems. Contact a health care provider if:  You think you are having a reaction to medicines you have taken.  You have repeated (recurrent) headaches.  You feel dizzy.  You have swelling in your  ankles.  You have trouble with your vision. Get help right away if:  You develop a severe headache or confusion.  You have unusual weakness or numbness, or you feel faint.  You have severe pain in your chest or abdomen.  You vomit repeatedly.  You have trouble breathing. Summary  Hypertension is when the force of blood pumping through your arteries is too strong. If this condition is not controlled, it may put you at risk for serious complications.  Your personal target blood pressure may vary depending on your medical conditions, your age, and other factors. For most people, a normal blood pressure is less than 120/80.  Hypertension is managed by lifestyle changes, medicines, or both. Lifestyle changes include weight loss, eating a healthy, low-sodium diet, exercising more, and limiting alcohol. This information is not intended to replace advice given to you by your health care provider. Make sure you discuss any questions you have with your health care provider. Document Revised: 07/04/2018 Document Reviewed: 02/08/2016 Elsevier Patient Education  [place].

## 2019-12-15 NOTE — Progress Notes (Signed)
Subjective:    Patient ID: Nicole Cunningham, female    DOB: 1960-05-05, 59 y.o.   MRN: [id number]  This is a 59 year old female history of COPD with passive smoke exposure but no direct smoking exposure, chronic depression and anxiety managed by [facility].  The patient arrives today with blood pressure 201/120 and no prior known history of hypertension.  She is not on medications for same.  The patient had been seen in the emergency room on 9 September for COPD exacerbation given prednisone and she finished that course of therapy she then had a telephone visit subsequent to that with her primary care provider who wished her to come in for direct physical exam at this visit and blood pressure check along with lung check.  She is on the Breo inhaler and also Combivent at this time.  She is yet to receive the [facility] but does have a Combivent.  [facility]   Past Medical History:  Diagnosis Date  . Bronchitis   . COPD (chronic obstructive pulmonary disease) (HCC)   . Hypertension      Family History  Problem Relation Age of Onset  . Diabetes Mother   . Diabetes Father      Social History   Socioeconomic History  . Marital status: Married    Spouse name: Not on file  . Number of children: Not on file  . Years of education: Not on file  . Highest education level: Not on file  Occupational History  . Not on file  Tobacco Use  . Smoking status: Never Smoker  . Smokeless tobacco: Never Used  Vaping Use  . Vaping Use: Never used  Substance and Sexual Activity  . Alcohol use: Not Currently  . Drug use: Never  . Sexual activity: Not Currently  Other Topics Concern  . Not on file  Social History Narrative  . Not on file   Social Determinants of Health   Financial Resource Strain:   . Difficulty of Paying Living Expenses: Not on file  Food Insecurity:   . Worried About Programme researcher, broadcasting/film/video in the Last Year: Not on file  . Ran Out of  Food in the Last Year: Not on file  Transportation Needs:   . Lack of Transportation (Medical): Not on file  . Lack of Transportation (Non-Medical): Not on file  Physical Activity:   . Days of Exercise per Week: Not on file  . Minutes of Exercise per Session: Not on file  Stress:   . Feeling of Stress : Not on file  Social Connections:   . Frequency of Communication with Friends and Family: Not on file  . Frequency of Social Gatherings with Friends and Family: Not on file  . Attends Religious Services: Not on file  . Active Member of Clubs or Organizations: Not on file  . Attends Banker Meetings: Not on file  . Marital Status: Not on file  Intimate Partner Violence:   . Fear of Current or Ex-Partner: Not on file  . Emotionally Abused: Not on file  . Physically Abused: Not on file  . Sexually Abused: Not on file     No Known Allergies   Outpatient Medications Prior to Visit  Medication Sig Dispense Refill  . albuterol (VENTOLIN HFA) 108 (90 Base) MCG/ACT inhaler Inhale 1-2 puffs into the lungs every 6 (six) hours as needed for wheezing or shortness of breath. 18 g 0  .  benzonatate (TESSALON) 200 MG capsule Take 1 capsule (200 mg total) by mouth 3 (three) times daily as needed for cough. 20 capsule 0  . Cetirizine HCl 10 MG CAPS Take 1 capsule (10 mg total) by mouth daily. 15 capsule 0  . FLUoxetine (PROZAC) 40 MG capsule Take 1 capsule (40 mg total) by mouth daily. 30 capsule 2  . fluticasone furoate-vilanterol (BREO ELLIPTA) 200-25 MCG/INH AEPB Inhale 1 puff into the lungs daily. 28 each 3  . gabapentin (NEURONTIN) 400 MG capsule Take 1 capsule (400 mg total) by mouth at bedtime. 30 capsule 2  . mirtazapine (REMERON) 30 MG tablet Take 1 tablet (30 mg total) by mouth at bedtime. 30 tablet 2  . QUEtiapine (SEROQUEL) 300 MG tablet Take 2 tablets (600 mg total) by mouth at bedtime. Take 2 tablets at bedtime 60 tablet 2  . Ipratropium-Albuterol (COMBIVENT RESPIMAT) 20-100  MCG/ACT AERS respimat Inhale 1 puff into the lungs every 6 (six) hours. 4 g 1  . predniSONE (DELTASONE) 10 MG tablet Take 6 tabs day one, 5 tabs day two, 4 tabs day three, etc 21 tablet 0   No facility-administered medications prior to visit.      Review of Systems  Constitutional: Negative.   HENT: Negative.   Eyes: Negative for visual disturbance.  Respiratory: Positive for cough, shortness of breath and wheezing.   Cardiovascular: Negative for chest pain, palpitations and leg swelling.  Gastrointestinal: Negative.   Genitourinary: Negative.   Musculoskeletal: Negative.   Neurological: Positive for dizziness and headaches.  Psychiatric/Behavioral: Negative for self-injury and suicidal ideas. The patient is nervous/anxious.        Objective:   Physical Exam Vitals:   12/15/19 1503 12/15/19 1525 12/15/19 1538 12/15/19 1612  BP: (!) 201/120 (!) 178/125 (!) 176/115 130/88  Pulse: 76   65  Resp: 17     Temp: (!) 97.3 F (36.3 C)     TempSrc: Temporal     SpO2: 99%     Weight: 121 lb (54.9 kg)     Height: 5\' 4"  (1.626 m)       Gen: Pleasant, well-nourished, in no distress,  normal affect  ENT: No lesions,  mouth clear,  oropharynx clear, no postnasal drip  Neck: No JVD, no TMG, no carotid bruits  Lungs: No use of accessory muscles, no dullness to percussion, distant breath sounds  Cardiovascular: RRR, heart sounds normal, no murmur or gallops, no peripheral edema  Abdomen: soft and NT, no HSM,  BS normal  Musculoskeletal: No deformities, no cyanosis or clubbing  Neuro: alert, non focal  Skin: Warm, no lesions or rashes  All labs reviewed in [facility] link  EKG today performed showed normal sinus rhythm no acute change      Assessment & Plan:  I personally reviewed all images and lab data in the [facility] system as well as any outside material available during this office visit and agree with the  radiology impressions.   Hypertensive urgency Hypertensive  urgency at this visit initial blood pressure 201/120 after 0.3 mg of clonidine over 1 hour.  Patient's blood pressure now in the 130/88 range  Plan now will be to begin losartan HCT 100/25 1 daily and amlodipine 10 mg daily  The patient was somewhat dry today could not obtain blood she will come back for complete metabolic profile and blood counts and lipid panel  Anxiety disorder Significant anxiety disorder managed by [facility] continue current therapies  MDD (major depressive disorder), recurrent, in  full remission (HCC) Major depression recurrent now in full remission per [facility]  COPD with chronic bronchitis (HCC) Chronic obstructive lung disease with passive smoke exposure will continue Breo 1 puff a day Combivent 2 puffs 4 times a day patient is no longer being exposed to smoke at this time  Note patient does declined the tetanus shot however she has already received the Covid vaccine one-time dose will obtain hepatitis C screen    Lauran was seen today for copd.  Diagnoses and all orders for this visit:  COPD exacerbation (HCC)  Essential hypertension -     Cancel: CBC with Differential -     Cancel: Comprehensive metabolic panel -     EKG 12-Lead -     cloNIDine (CATAPRES) tablet 0.2 mg -     cloNIDine (CATAPRES) tablet 0.1 mg -     CBC with Differential; Future -     Comprehensive metabolic panel; Future  Need for hepatitis C screening test -     Cancel: HCV Ab w/Rflx to Verification -     HCV Ab w/Rflx to Verification; Future  Screening for HIV (human immunodeficiency virus) -     Cancel: HIV antibody (with reflex) -     HIV antibody (with reflex); Future  Breast cancer screening by mammogram -     MS DIGITAL SCREENING BILATERAL; Future  Colon cancer screening -     Fecal occult blood, imunochemical([facility]); Future  Hypertensive urgency  Anxiety disorder, unspecified type  MDD (major depressive disorder), recurrent, in full remission (HCC)  COPD  with chronic bronchitis (HCC)  Other orders -     Ipratropium-Albuterol (COMBIVENT RESPIMAT) 20-100 MCG/ACT AERS respimat; Inhale 1 puff into the lungs every 6 (six) hours. -     amLODipine (NORVASC) 10 MG tablet; Take 1 tablet (10 mg total) by mouth daily. -     losartan-hydrochlorothiazide (HYZAAR) 100-25 MG tablet; Take 1 tablet by mouth daily.  Mammogram has been ordered she will follow up on this we will also give the fecal occult card for colon cancer screening she wishes to decline the influenza vaccine until October

## 2019-12-18 ENCOUNTER — Other Ambulatory Visit: Payer: Self-pay

## 2019-12-18 MED FILL — COMBIVENT RESPIMAT INHAL SP: 20-100 | 25 days supply | Qty: 1 | Fill #0

## 2019-12-18 MED FILL — LOSARTAN-HCTZ 100-25 MG TAB: 100-25 | 30 days supply | Qty: 30 | Fill #0

## 2019-12-18 MED FILL — AMLODIPINE BESYLATE 10 MG T: 10 | 30 days supply | Qty: 30 | Fill #0

## 2019-12-25 ENCOUNTER — Ambulatory Visit: Payer: Self-pay

## 2020-01-08 ENCOUNTER — Ambulatory Visit (HOSPITAL_COMMUNITY): Payer: No Payment, Other | Admitting: Clinical

## 2020-01-22 ENCOUNTER — Ambulatory Visit (HOSPITAL_COMMUNITY): Payer: Self-pay | Admitting: Clinical

## 2020-02-01 MED FILL — LOSARTAN-HCTZ 100-25 MG TAB: 100-25 | 30 days supply | Qty: 30 | Fill #1

## 2020-02-01 MED FILL — COMBIVENT RESPIMAT INHAL SP: 20-100 | 25 days supply | Qty: 1 | Fill #1

## 2020-02-01 MED FILL — AMLODIPINE BESYLATE 10 MG T: 10 | 30 days supply | Qty: 30 | Fill #1

## 2020-02-01 MED FILL — BREO ELLIPTA 200-25 MCG INH: 200-25 | 30 days supply | Qty: 60 | Fill #1

## 2020-02-11 ENCOUNTER — Ambulatory Visit: Payer: Self-pay | Admitting: Internal Medicine

## 2020-02-11 DIAGNOSIS — Z13228 Encounter for screening for other metabolic disorders: Secondary | ICD-10-CM

## 2020-02-11 DIAGNOSIS — Z1159 Encounter for screening for other viral diseases: Secondary | ICD-10-CM

## 2020-02-11 DIAGNOSIS — Z113 Encounter for screening for infections with a predominantly sexual mode of transmission: Secondary | ICD-10-CM

## 2020-02-11 DIAGNOSIS — Z0000 Encounter for general adult medical examination without abnormal findings: Secondary | ICD-10-CM

## 2020-02-11 DIAGNOSIS — Z130 Encounter for screening for diseases of the blood and blood-forming organs and certain disorders involving the immune mechanism: Secondary | ICD-10-CM

## 2020-02-11 DIAGNOSIS — Z124 Encounter for screening for malignant neoplasm of cervix: Secondary | ICD-10-CM

## 2020-02-23 ENCOUNTER — Ambulatory Visit: Payer: Self-pay | Admitting: Internal Medicine

## 2020-03-11 MED FILL — LOSARTAN-HCTZ 100-25 MG TAB: 100-25 | 30 days supply | Qty: 30 | Fill #2

## 2020-03-14 ENCOUNTER — Other Ambulatory Visit: Payer: Self-pay

## 2020-03-14 ENCOUNTER — Telehealth (HOSPITAL_COMMUNITY): Payer: No Payment, Other | Admitting: Psychiatry

## 2020-03-23 MED FILL — BREO ELLIPTA 200-25 MCG INH: 200-25 | 30 days supply | Qty: 60 | Fill #2

## 2020-03-30 MED FILL — $BREO ELLIPTA 200-25 MCG IN: 200-25 | 30 days supply | Qty: 60 | Fill #2

## 2020-03-30 MED FILL — AMLODIPINE BESYLATE 10 MG T: 10 | 30 days supply | Qty: 30 | Fill #2

## 2020-04-18 ENCOUNTER — Encounter (HOSPITAL_COMMUNITY): Payer: Self-pay | Admitting: Emergency Medicine

## 2020-04-18 ENCOUNTER — Other Ambulatory Visit: Payer: Self-pay

## 2020-04-18 ENCOUNTER — Emergency Department (HOSPITAL_COMMUNITY): Payer: Self-pay

## 2020-04-18 ENCOUNTER — Inpatient Hospital Stay (HOSPITAL_COMMUNITY): Payer: Self-pay

## 2020-04-18 ENCOUNTER — Inpatient Hospital Stay (HOSPITAL_COMMUNITY)
Admission: EM | Admit: 2020-04-18 | Discharge: 2020-04-22 | DRG: 871 | Disposition: A | Discharge status: Home Health | Payer: Self-pay | Attending: Internal Medicine | Admitting: Internal Medicine

## 2020-04-18 DIAGNOSIS — A4151 Sepsis due to Escherichia coli [E. coli]: Principal | ICD-10-CM | POA: Diagnosis present

## 2020-04-18 DIAGNOSIS — F32A Depression, unspecified: Secondary | ICD-10-CM | POA: Diagnosis present

## 2020-04-18 DIAGNOSIS — J440 Chronic obstructive pulmonary disease with acute lower respiratory infection: Secondary | ICD-10-CM | POA: Diagnosis present

## 2020-04-18 DIAGNOSIS — I10 Essential (primary) hypertension: Secondary | ICD-10-CM | POA: Diagnosis present

## 2020-04-18 DIAGNOSIS — A419 Sepsis, unspecified organism: Secondary | ICD-10-CM | POA: Diagnosis present

## 2020-04-18 DIAGNOSIS — G9341 Metabolic encephalopathy: Secondary | ICD-10-CM | POA: Diagnosis present

## 2020-04-18 DIAGNOSIS — E872 Acidosis: Secondary | ICD-10-CM | POA: Diagnosis present

## 2020-04-18 DIAGNOSIS — R911 Solitary pulmonary nodule: Secondary | ICD-10-CM | POA: Diagnosis present

## 2020-04-18 DIAGNOSIS — K8020 Calculus of gallbladder without cholecystitis without obstruction: Secondary | ICD-10-CM | POA: Diagnosis present

## 2020-04-18 DIAGNOSIS — Z79899 Other long term (current) drug therapy: Secondary | ICD-10-CM

## 2020-04-18 DIAGNOSIS — E860 Dehydration: Secondary | ICD-10-CM | POA: Diagnosis present

## 2020-04-18 DIAGNOSIS — E876 Hypokalemia: Secondary | ICD-10-CM | POA: Diagnosis present

## 2020-04-18 DIAGNOSIS — N390 Urinary tract infection, site not specified: Secondary | ICD-10-CM | POA: Diagnosis present

## 2020-04-18 DIAGNOSIS — J449 Chronic obstructive pulmonary disease, unspecified: Secondary | ICD-10-CM | POA: Diagnosis present

## 2020-04-18 DIAGNOSIS — U071 COVID-19: Secondary | ICD-10-CM

## 2020-04-18 DIAGNOSIS — E870 Hyperosmolality and hypernatremia: Secondary | ICD-10-CM | POA: Diagnosis present

## 2020-04-18 DIAGNOSIS — N179 Acute kidney failure, unspecified: Secondary | ICD-10-CM | POA: Diagnosis present

## 2020-04-18 DIAGNOSIS — J1282 Pneumonia due to coronavirus disease 2019: Secondary | ICD-10-CM | POA: Diagnosis present

## 2020-04-18 DIAGNOSIS — R6520 Severe sepsis without septic shock: Secondary | ICD-10-CM | POA: Diagnosis present

## 2020-04-18 DIAGNOSIS — J4489 Other specified chronic obstructive pulmonary disease: Secondary | ICD-10-CM | POA: Diagnosis present

## 2020-04-18 DIAGNOSIS — D500 Iron deficiency anemia secondary to blood loss (chronic): Secondary | ICD-10-CM | POA: Diagnosis present

## 2020-04-18 DIAGNOSIS — Z7951 Long term (current) use of inhaled steroids: Secondary | ICD-10-CM

## 2020-04-18 LAB — URINALYSIS, ROUTINE W REFLEX MICROSCOPIC
Bilirubin Urine: NEGATIVE
Glucose, UA: NEGATIVE mg/dL
Ketones, ur: 5 mg/dL — AB
Nitrite: POSITIVE — AB
Protein, ur: NEGATIVE mg/dL
Specific Gravity, Urine: 1.01 (ref 1.005–1.030)
pH: 5 (ref 5.0–8.0)

## 2020-04-18 LAB — COMPREHENSIVE METABOLIC PANEL
ALT: 22 U/L (ref 0–44)
AST: 23 U/L (ref 15–41)
Albumin: 4.2 g/dL (ref 3.5–5.0)
Alkaline Phosphatase: 105 U/L (ref 38–126)
Anion gap: 19 — ABNORMAL HIGH (ref 5–15)
BUN: 154 mg/dL — ABNORMAL HIGH (ref 8–23)
CO2: 17 mmol/L — ABNORMAL LOW (ref 22–32)
Calcium: 10.6 mg/dL — ABNORMAL HIGH (ref 8.9–10.3)
Chloride: 105 mmol/L (ref 98–111)
Creatinine, Ser: 3.89 mg/dL — ABNORMAL HIGH (ref 0.44–1.00)
GFR, Estimated: 13 mL/min — ABNORMAL LOW (ref >60)
Glucose, Bld: 197 mg/dL — ABNORMAL HIGH (ref 70–99)
Potassium: 4.3 mmol/L (ref 3.5–5.1)
Sodium: 141 mmol/L (ref 135–145)
Total Bilirubin: 1.3 mg/dL — ABNORMAL HIGH (ref 0.3–1.2)
Total Protein: 9.7 g/dL — ABNORMAL HIGH (ref 6.5–8.1)

## 2020-04-18 LAB — HEMOGLOBIN A1C
Hgb A1c MFr Bld: 5.3 % (ref 4.8–5.6)
Mean Plasma Glucose: 105.41 mg/dL

## 2020-04-18 LAB — CBC
HCT: 28.9 % — ABNORMAL LOW (ref 36.0–46.0)
Hemoglobin: 9.4 g/dL — ABNORMAL LOW (ref 12.0–15.0)
MCH: 22.1 pg — ABNORMAL LOW (ref 26.0–34.0)
MCHC: 32.5 g/dL (ref 30.0–36.0)
MCV: 68 fL — ABNORMAL LOW (ref 80.0–100.0)
Platelets: 288 10*3/uL (ref 150–400)
RBC: 4.25 MIL/uL (ref 3.87–5.11)
RDW: 18.3 % — ABNORMAL HIGH (ref 11.5–15.5)
WBC: 16.1 10*3/uL — ABNORMAL HIGH (ref 4.0–10.5)
nRBC: 0 % (ref 0.0–0.2)

## 2020-04-18 LAB — PROTIME-INR
INR: 1.1 (ref 0.8–1.2)
Prothrombin Time: 14 seconds (ref 11.4–15.2)

## 2020-04-18 LAB — C-REACTIVE PROTEIN: CRP: 7.4 mg/dL — ABNORMAL HIGH (ref <1.0)

## 2020-04-18 LAB — APTT: aPTT: 30 seconds (ref 24–36)

## 2020-04-18 LAB — LIPASE, BLOOD: Lipase: 624 U/L — ABNORMAL HIGH (ref 11–51)

## 2020-04-18 LAB — TSH: TSH: 0.756 u[IU]/mL (ref 0.350–4.500)

## 2020-04-18 LAB — LACTIC ACID, PLASMA
Lactic Acid, Venous: 2.9 mmol/L (ref 0.5–1.9)
Lactic Acid, Venous: 2.2 mmol/L (ref 0.5–1.9)

## 2020-04-18 LAB — SARS CORONAVIRUS 2 (TAT 6-24 HRS): SARS Coronavirus 2: POSITIVE — AB

## 2020-04-18 LAB — PROCALCITONIN: Procalcitonin: <0.1 ng/mL

## 2020-04-18 MED ORDER — ACETAMINOPHEN 325 MG PO TABS: 650.0000 mg | ORAL_TABLET | Freq: Four times a day (QID) | ORAL | Status: DC | PRN

## 2020-04-18 MED ORDER — SODIUM CHLORIDE 0.9 % IV SOLN
1.0000 g | Freq: Once | INTRAVENOUS | Status: AC
  Administered 2020-04-18: 1 g via INTRAVENOUS
  Filled 2020-04-18: qty 10

## 2020-04-18 MED ORDER — ACETAMINOPHEN 650 MG RE SUPP: 650.0000 mg | Freq: Four times a day (QID) | RECTAL | Status: DC | PRN

## 2020-04-18 MED ORDER — HYDROCODONE-ACETAMINOPHEN 5-325 MG PO TABS: 1.0000 | ORAL_TABLET | ORAL | Status: DC | PRN

## 2020-04-18 MED ORDER — FLUTICASONE FUROATE-VILANTEROL 200-25 MCG/INH IN AEPB
1.0000 | INHALATION_SPRAY | Freq: Every day | RESPIRATORY_TRACT | Status: DC
  Administered 2020-04-19 – 2020-04-22 (×4): 1 via RESPIRATORY_TRACT
  Filled 2020-04-18: qty 28

## 2020-04-18 MED ORDER — ALBUTEROL SULFATE HFA 108 (90 BASE) MCG/ACT IN AERS
1.0000 | INHALATION_SPRAY | Freq: Four times a day (QID) | RESPIRATORY_TRACT | Status: DC | PRN
Start: 2020-04-18 — End: 2020-04-22

## 2020-04-18 MED ORDER — HYDRALAZINE HCL 20 MG/ML IJ SOLN
10.0000 mg | Freq: Four times a day (QID) | INTRAMUSCULAR | Status: DC | PRN
Start: 2020-04-18 — End: 2020-04-22

## 2020-04-18 MED ORDER — IPRATROPIUM-ALBUTEROL 20-100 MCG/ACT IN AERS
2.0000 | INHALATION_SPRAY | Freq: Four times a day (QID) | RESPIRATORY_TRACT | Status: DC
  Administered 2020-04-19 – 2020-04-22 (×12): 2 via RESPIRATORY_TRACT
  Filled 2020-04-18: qty 4

## 2020-04-18 MED ORDER — LACTATED RINGERS IV BOLUS
30.0000 mL/kg | Freq: Once | INTRAVENOUS | Status: AC
  Administered 2020-04-18: 1632 mL via INTRAVENOUS

## 2020-04-18 MED ORDER — ALBUTEROL SULFATE (2.5 MG/3ML) 0.083% IN NEBU: 2.5000 mg | INHALATION_SOLUTION | Freq: Four times a day (QID) | RESPIRATORY_TRACT | Status: DC

## 2020-04-18 MED ORDER — HEPARIN SODIUM (PORCINE) 5000 UNIT/ML IJ SOLN
5000.0000 [IU] | Freq: Three times a day (TID) | INTRAMUSCULAR | Status: DC
  Administered 2020-04-18 – 2020-04-19 (×3): 5000 [IU] via SUBCUTANEOUS
  Filled 2020-04-18 (×3): qty 1

## 2020-04-18 MED ORDER — SENNOSIDES-DOCUSATE SODIUM 8.6-50 MG PO TABS
1.0000 | ORAL_TABLET | Freq: Every evening | ORAL | Status: DC | PRN
  Administered 2020-04-19: 1 via ORAL
  Filled 2020-04-18: qty 1

## 2020-04-18 MED ORDER — MIRTAZAPINE 15 MG PO TABS
30.0000 mg | ORAL_TABLET | Freq: Every day | ORAL | Status: DC
  Administered 2020-04-18 – 2020-04-21 (×4): 30 mg via ORAL
  Filled 2020-04-18 (×4): qty 2

## 2020-04-18 MED ORDER — ONDANSETRON HCL 4 MG/2ML IJ SOLN
4.0000 mg | Freq: Once | INTRAMUSCULAR | Status: AC | PRN
  Administered 2020-04-18: 4 mg via INTRAVENOUS
  Filled 2020-04-18: qty 2

## 2020-04-18 MED ORDER — LACTATED RINGERS IV BOLUS
1000.0000 mL | Freq: Once | INTRAVENOUS | Status: AC
  Administered 2020-04-18: 1000 mL via INTRAVENOUS

## 2020-04-18 MED ORDER — ZOLPIDEM TARTRATE 5 MG PO TABS: 5.0000 mg | ORAL_TABLET | Freq: Every evening | ORAL | Status: DC | PRN

## 2020-04-18 MED ORDER — FLEET ENEMA 7-19 GM/118ML RE ENEM
1.0000 | ENEMA | Freq: Once | RECTAL | Status: DC | PRN
  Filled 2020-04-18: qty 1

## 2020-04-18 MED ORDER — QUETIAPINE FUMARATE 100 MG PO TABS
300.0000 mg | ORAL_TABLET | Freq: Every day | ORAL | Status: DC
  Administered 2020-04-18 – 2020-04-21 (×4): 300 mg via ORAL
  Filled 2020-04-18 (×4): qty 3

## 2020-04-18 MED ORDER — LACTATED RINGERS IV SOLN: INTRAVENOUS | Status: DC

## 2020-04-18 MED ORDER — BISACODYL 5 MG PO TBEC: 5.0000 mg | DELAYED_RELEASE_TABLET | Freq: Every day | ORAL | Status: DC | PRN

## 2020-04-18 NOTE — Progress Notes (Signed)
Received pt from the ED at 2310. Pt is alert and verbally responsive but noted with some intermittent confusion. Stable sat at 99% on RA. Denies pain or discomfort. Oriented to room and call bell placed within reach.

## 2020-04-18 NOTE — TOC Initial Note (Signed)
Transition of Care [facility]) - Initial/Assessment Note    Patient Details  Name: Nicole Cunningham MRN: [id number] Date of Birth: Jun 09, 1960  Transition of Care [facility]) CM/SW Contact:    Elliot Cousin, RN Phone Number:  [phone] 04/18/2020, 4:22 PM  Clinical Narrative:                 TOC CM spoke to pt and states she uses [facility]. She will be able to get a one time free fill at the pharmacy. Pt reports her PCP, Dr Earlene Plater at [facility]. TOC CM/CSW will continue to follow up for dc needs.     Expected Discharge Plan: Home/Self Care Barriers to Discharge: Continued Medical Work up   Patient Goals and CMS Choice        Expected Discharge Plan and Services Expected Discharge Plan: Home/Self Care In-house Referral: Clinical Social Work Discharge Planning Services: CM Consult,Medication Assistance   Living arrangements for the past 2 months: Apartment                                      Prior Living Arrangements/Services Living arrangements for the past 2 months: Apartment Lives with:: Self   Do you feel safe going back to the place where you live?: Yes      Need for Family Participation in Patient Care: No (Comment) Care giver support system in place?: No (comment)      Activities of Daily Living Home Assistive Devices/Equipment: None ADL Screening (condition at time of admission) Patient's cognitive ability adequate to safely complete daily activities?: No Is the patient deaf or have difficulty hearing?: No Does the patient have difficulty seeing, even when wearing glasses/contacts?: No Does the patient have difficulty concentrating, remembering, or making decisions?: No Patient able to express need for assistance with ADLs?: Yes Does the patient have difficulty dressing or bathing?: Yes Independently performs ADLs?: No Communication: Independent Dressing (OT): Needs assistance Is this a change from baseline?: Change from  baseline, expected to last >3 days Grooming: Independent Feeding: Independent Bathing: Needs assistance Is this a change from baseline?: Change from baseline, expected to last >3 days Toileting: Needs assistance Is this a change from baseline?: Change from baseline, expected to last >3days In/Out Bed: Needs assistance Is this a change from baseline?: Change from baseline, expected to last >3 days Walks in Home: Needs assistance Is this a change from baseline?: Change from baseline, expected to last >3 days Does the patient have difficulty walking or climbing stairs?: Yes Weakness of Legs: Both  Permission Sought/Granted Permission sought to share information with : Case Manager,Family Supports,PCP Permission granted to share information with : Yes, Verbal Permission Granted              Emotional Assessment   Attitude/Demeanor/Rapport: Engaged Affect (typically observed): Accepting Orientation: : Oriented to Self,Oriented to Place,Oriented to  Time,Oriented to Situation   Psych Involvement: No (comment)  Admission diagnosis:  Generalized Weakness;Emesis;Nausea Patient Active Problem List   Diagnosis Date Noted  . Hypertensive urgency 12/15/2019  . COPD with chronic bronchitis (HCC) 12/15/2019  . MDD (major depressive disorder), recurrent, in full remission (HCC) 12/14/2019  . Anxiety disorder 12/14/2019   PCP:  Arvilla Market, DO Pharmacy:   [facility] [address] [place]), [place] - [address] [place] [address] Phone: [phone] Fax: [phone]  [facility] [place],  - [address] [place] [address] Phone: [phone] Fax: [phone]  [facility] [place], [place] - [place] [address] [place] [address] Phone: [phone] Fax: [phone]     Social Determinants of Health (SDOH) Interventions     Readmission Risk Interventions No flowsheet data found.

## 2020-04-18 NOTE — ED Notes (Signed)
Bed bugs discovered on patient while obtaining IV access. x2 bugs placed into specimen cup at the bedside.

## 2020-04-18 NOTE — ED Notes (Signed)
Pt belongings in triage 

## 2020-04-18 NOTE — ED Notes (Signed)
Collected a blue top culture tube and blue top on patient. Sent to lab for hold.

## 2020-04-18 NOTE — ED Notes (Signed)
emsis x1, zofran admin

## 2020-04-18 NOTE — ED Notes (Signed)
Jonise Weightman Husband [phone]

## 2020-04-18 NOTE — ED Triage Notes (Signed)
Patient arrives complaining of vomiting that she has had for 2 weeks. Patient states that she has been significantly weaker today. Per EMS, patient's BP went from 136/74 to 84/59.

## 2020-04-18 NOTE — ED Notes (Signed)
Report called and given to nurse.  

## 2020-04-18 NOTE — ED Notes (Signed)
Date and time results received: 04/18/20 0800 (use smartphrase ".now" to insert current time)  Test: latic acid Critical Value: 2.9  Name of Provider Notified: Linwood Dibbles  Orders Received? Or Actions Taken?: aware

## 2020-04-18 NOTE — ED Provider Notes (Signed)
[facility]-EMERGENCY DEPT Provider Note   CSN: [id number] Arrival date & time: 04/18/20  1324     History Chief Complaint  Patient presents with  . Vomiting    Nicole Cunningham is a 60 y.o. female.  HPI   Patient presents to the emergency room for evaluation of nausea and vomiting.  Patient states her symptoms have been ongoing for couple of weeks.  She states she is vomiting a couple times per day.  Whenever she tries to eat she will become nauseated.  Patient has not been able to eat or drink much over the last couple of weeks.  She is not having any trouble with abdominal pain.  She has not noticed any abdominal bloating.  She denies any fevers or chills.  She is not having any cough or sore throat.  Patient states she went to her primary care doctor and was given a medicine.  She is not sure the name.  Symptoms have not gotten any better.  She feels very weak now.  Called EMS.  Patient lives with her husband and he is not sick right now.  Patient has not been vaccinated for Covid  Past Medical History:  Diagnosis Date  . Bronchitis   . COPD (chronic obstructive pulmonary disease) (HCC)   . Hypertension     Patient Active Problem List   Diagnosis Date Noted  . Hypertensive urgency 12/15/2019  . COPD with chronic bronchitis (HCC) 12/15/2019  . MDD (major depressive disorder), recurrent, in full remission (HCC) 12/14/2019  . Anxiety disorder 12/14/2019    History reviewed. No pertinent surgical history.   OB History   No obstetric history on file.     Family History  Problem Relation Age of Onset  . Diabetes Mother   . Diabetes Father     Social History   Tobacco Use  . Smoking status: Never Smoker  . Smokeless tobacco: Never Used  Vaping Use  . Vaping Use: Never used  Substance Use Topics  . Alcohol use: Not Currently  . Drug use: Never    Home Medications Prior to Admission medications   Medication Sig Start Date End Date Taking?  Authorizing Provider  albuterol (VENTOLIN HFA) 108 (90 Base) MCG/ACT inhaler Inhale 1-2 puffs into the lungs every 6 (six) hours as needed for wheezing or shortness of breath. 12/03/19  Yes Particia Nearing, PA-C  amLODipine (NORVASC) 10 MG tablet Take 1 tablet (10 mg total) by mouth daily. 12/15/19  Yes Storm Frisk, MD  FLUoxetine (PROZAC) 40 MG capsule Take 1 capsule (40 mg total) by mouth daily. 12/14/19  Yes Zena Amos, MD  fluticasone furoate-vilanterol (BREO ELLIPTA) 200-25 MCG/INH AEPB Inhale 1 puff into the lungs daily. 12/10/19  Yes Arvilla Market, DO  gabapentin (NEURONTIN) 400 MG capsule Take 1 capsule (400 mg total) by mouth at bedtime. 12/14/19  Yes Zena Amos, MD  losartan-hydrochlorothiazide (HYZAAR) 100-25 MG tablet Take 1 tablet by mouth daily. 12/15/19  Yes Storm Frisk, MD  mirtazapine (REMERON) 30 MG tablet Take 1 tablet (30 mg total) by mouth at bedtime. 12/14/19  Yes Zena Amos, MD  QUEtiapine (SEROQUEL) 300 MG tablet Take 2 tablets (600 mg total) by mouth at bedtime. Take 2 tablets at bedtime 12/14/19  Yes Zena Amos, MD  Ipratropium-Albuterol (COMBIVENT RESPIMAT) 20-100 MCG/ACT AERS respimat Inhale 1 puff into the lungs every 6 (six) hours. Patient not taking: Reported on 04/18/2020 12/15/19   Storm Frisk, MD  Allergies    Patient has no known allergies.  Review of Systems   Review of Systems  All other systems reviewed and are negative.   Physical Exam Updated Vital Signs BP 109/81   Pulse (!) 105   Temp (!) 97.4 F (36.3 C) (Oral)   Resp 14   Ht 1.626 m (5\' 4" )   Wt 54.4 kg   SpO2 96%   BMI 20.60 kg/m   Physical Exam Vitals and nursing note reviewed.  Constitutional:      Appearance: She is well-developed and well-nourished. She is ill-appearing.     Comments: Underweight  HENT:     Head: Normocephalic and atraumatic.     Right Ear: External ear normal.     Left Ear: External ear normal.     Mouth/Throat:      Comments: Mucous membranes are dry Eyes:     General: No scleral icterus.       Right eye: No discharge.        Left eye: No discharge.     Conjunctiva/sclera: Conjunctivae normal.  Neck:     Trachea: No tracheal deviation.  Cardiovascular:     Rate and Rhythm: Regular rhythm. Tachycardia present.     Pulses: Intact distal pulses.  Pulmonary:     Effort: Pulmonary effort is normal. No respiratory distress.     Breath sounds: Normal breath sounds. No stridor. No wheezing or rales.  Abdominal:     General: Bowel sounds are normal. There is no distension.     Palpations: Abdomen is soft.     Tenderness: There is no abdominal tenderness. There is no guarding or rebound.  Musculoskeletal:        General: No tenderness or edema.     Cervical back: Neck supple.  Skin:    General: Skin is warm and dry.     Findings: No rash.  Neurological:     Mental Status: She is alert.     Cranial Nerves: No cranial nerve deficit (no facial droop, extraocular movements intact, no slurred speech).     Sensory: No sensory deficit.     Motor: No abnormal muscle tone or seizure activity.     Coordination: Coordination normal.     Deep Tendon Reflexes: Strength normal.  Psychiatric:        Mood and Affect: Mood and affect normal.     ED Results / Procedures / Treatments   Labs (all labs ordered are listed, but only abnormal results are displayed) Labs Reviewed  SARS CORONAVIRUS 2 (TAT 6-24 HRS) - Abnormal; Notable for the following components:      Result Value   SARS Coronavirus 2 POSITIVE (*)    All other components within normal limits  CBC - Abnormal; Notable for the following components:   WBC 16.1 (*)    Hemoglobin 9.4 (*)    HCT 28.9 (*)    MCV 68.0 (*)    MCH 22.1 (*)    RDW 18.3 (*)    All other components within normal limits  URINALYSIS, ROUTINE W REFLEX MICROSCOPIC - Abnormal; Notable for the following components:   Hgb urine dipstick SMALL (*)    Ketones, ur 5 (*)    Nitrite  POSITIVE (*)    Leukocytes,Ua SMALL (*)    Bacteria, UA MANY (*)    All other components within normal limits  LACTIC ACID, PLASMA - Abnormal; Notable for the following components:   Lactic Acid, Venous 2.9 (*)    All  other components within normal limits  LACTIC ACID, PLASMA - Abnormal; Notable for the following components:   Lactic Acid, Venous 2.2 (*)    All other components within normal limits  COMPREHENSIVE METABOLIC PANEL - Abnormal; Notable for the following components:   CO2 17 (*)    Glucose, Bld 197 (*)    BUN 154 (*)    Creatinine, Ser 3.89 (*)    Calcium 10.6 (*)    Total Protein 9.7 (*)    Total Bilirubin 1.3 (*)    GFR, Estimated 13 (*)    Anion gap 19 (*)    All other components within normal limits  LIPASE, BLOOD - Abnormal; Notable for the following components:   Lipase 624 (*)    All other components within normal limits  CULTURE, BLOOD (SINGLE)  URINE CULTURE  PROTIME-INR  APTT    EKG EKG Interpretation  Date/Time:  Monday April 18 2020 06:32:15 EST Ventricular Rate:  134 PR Interval:    QRS Duration: 93 QT Interval:  302 QTC Calculation: 451 R Axis:   85 Text Interpretation: Sinus tachycardia Otherwise normal ecg Confirmed by Geoffery Lyons ([id number]) on 04/18/2020 6:35:20 AM   Radiology DG Abdomen Acute W/Chest  Result Date: 04/18/2020 CLINICAL DATA:  Vomiting for 2 weeks, weakness. EXAM: DG ABDOMEN ACUTE WITH 1 VIEW CHEST COMPARISON:  Chest radiograph 10/09/2019. FINDINGS: Frontal view of the chest is somewhat rotated. Trachea is midline. Heart size normal. Lungs are clear. Minimal biapical pleural thickening. No pleural fluid. Two views of the abdomen show stool throughout the colon. Large calcified mass in the central anatomic pelvis likely represents a uterine fibroid. Bone island in the right femoral neck. IMPRESSION: 1. Stool throughout the colon is indicative of constipation. 2. No acute findings in the chest. Electronically Signed   By: Leanna Battles M.D.   On: 04/18/2020 08:46    Procedures .Critical Care Performed by: Linwood Dibbles, MD Authorized by: Linwood Dibbles, MD   Critical care provider statement:    Critical care time (minutes):  45   Critical care was time spent personally by me on the following activities:  Discussions with consultants, evaluation of patient's response to treatment, examination of patient, ordering and performing treatments and interventions, ordering and review of laboratory studies, ordering and review of radiographic studies, pulse oximetry, re-evaluation of patient's condition, obtaining history from patient or surrogate and review of old charts    Medications Ordered in ED Medications  ondansetron (ZOFRAN) injection 4 mg (4 mg Intravenous Given 04/18/20 0837)  lactated ringers bolus 1,632 mL (0 mL/kg  54.4 kg Intravenous Stopped 04/18/20 1218)  lactated ringers bolus 1,000 mL (1,000 mLs Intravenous New Bag/Given 04/18/20 1225)    ED Course  I have reviewed the triage vital signs and the nursing notes.  Pertinent labs & imaging results that were available during my care of the patient were reviewed by me and considered in my medical decision making (see chart for details).  Clinical Course as of 04/18/20 1514  Mon Apr 18, 2020  1127 Patient's labs are notable for elevated BUN and creatinine consistent with acute renal failure. [JK]  1127 Patient also has evidence of metabolic acidosis and lipase elevated consistent with pancreatitis [JK]  1127 Leukocytosis noted.  Anemia has rest from 1 year ago. [JK]  1127 We will proceed with Noncon CT scan to evaluate the pancreas [JK]  1222 Patient will IV fluid bolus ordered [JK]  1509 IMPRESSION: Patchy airspace opacities in the lungs bilaterally consistent  with multifocal infiltrate. Correlate with COVID-19 testing.  Normal appearing pancreas.  Cholelithiasis without complicating factors.  Changes of mild constipation.  5 mm nodule in the medial  right lung base. No follow-up needed if patient is low-risk. Non-contrast chest CT can be considered in 12 months if patient is high-risk. This recommendation follows the consensus statement: Guidelines for Management of Incidental Pulmonary Nodules Detected on CT Images: From the Fleischner Society 2017; Radiology 2017; 284:228-243.  Uterine fibroid change.   Electronically Signed By: Alcide Clever M.D. On: 04/18/2020 13:00 [JK]    Clinical Course User Index [JK] Linwood Dibbles, MD   MDM Rules/Calculators/A&P                          Patient presented to the ER for evaluation of nausea and vomiting.  Patient denied any respiratory symptoms.  ED work-up was notable for tachycardia.  Symptoms concerning for the possibility of severe dehydration, bowel obstruction, colitis.  ED work-up notable for acute renal failure with elevated BUN and creatinine of 154 and 3.89.  No complaints of GI bleeding.  Patient CT scan does not show any evidence of acute abnormality.  Patient does have an elevated lipase although no findings of pancreatitis on the CT scan.  Suspect patient's decreased p.o. intake associated with her covid infection has caused this acute kidney injury.  Patient has been treated with IV fluids and repeat fluid boluses.  Lactic acid elevated but I doubt bacterial sepsis and will hold off on empiric antibiotics.  Plan on continued IV hydration.  I will consult with the medical service for admission and further treatment.    Final Clinical Impression(s) / ED Diagnoses Final diagnoses:  COVID-19 virus infection  AKI (acute kidney injury) (HCC)  Dehydration      Linwood Dibbles, MD 04/18/20 1515

## 2020-04-18 NOTE — H&P (Addendum)
[facility] History and Physical  ZAIYA ANNUNZIATO UMP:[id number] DOB: [birth year] DOA: 04/18/2020 PCP: Arvilla Market, DO  Admitted from: Home Chief Complaint: Nausea, vomiting, diarrhea  History of Present Illness: [name] is a 60 y.o. female with history of COPD, HTN. Patient presented to the ED today with complaint of nausea, vomiting progressively worsening for last 2 weeks making her weak and dehydrated.  She has not been able to hold any thing down.  Also complains of abdominal pain, bloating, no fever, no chills.  No respiratory symptoms.  She apparently went to her primary care doctor and was given some medicine, not sure of the name, no resolution of symptoms. She vomited couple of times today.  Husband called EMS. Per EMS, patient had a blood pressure of 136/74 initially but it got weak to 84/59 later.  Patient has not been vaccinated for Covid. Of note, ED staff noted bedbugs while attending the patient.  In the ED, patient was afebrile, heart rate in 130s, blood pressure mostly over 100, breathing on room air. Labs with WBC count elevated to 16.1, hemoglobin low at 9.4 with MCV low at 68. Glucose elevated 297, BUN/creatinine significant elevated to 154/3.89, lipase level elevated to 624. Lactic acid initial elevated 2.9, subsequently down to 2.2 Urinalysis with clear yellow urine, small leukocytes, positive nitrite, many bacteria.  Urine culture and blood culture sent. Abdominal x-ray was done which showed: Loaded with stool. Covid PCR positive. CT abdomen showed patchy airspace opacities in the lungs bilaterally consistent with multifocal infiltrate. Normal pancreas, cholelithiasis, mild constipation. 5 mm lung nodule in the right base  At the time of my evaluation, patient was alert, awake, knew she was in the hospital.  Slow to respond.  She could not give me details of the history.  She says she lives with her husband.  Contact information in the  chart is of her son.  Mobile number is entered wrong, unable to reach him at the work number. So at this time, I could not confirm the details of the history as taken by the ED physician.  Review of Systems:  All systems were reviewed and were negative unless otherwise mentioned in the HPI   Past medical history: Past Medical History:  Diagnosis Date  . Bronchitis   . COPD (chronic obstructive pulmonary disease) (HCC)   . Hypertension     Past surgical history: History reviewed. No pertinent surgical history.  Social History:  reports that she has never smoked. She has never used smokeless tobacco. She reports previous alcohol use. She reports that she does not use drugs.  Allergies:  No Known Allergies  Family history:  Family History  Problem Relation Age of Onset  . Diabetes Mother   . Diabetes Father     Home Meds: Prior to Admission medications   Medication Sig Start Date End Date Taking? Authorizing Provider  albuterol (VENTOLIN HFA) 108 (90 Base) MCG/ACT inhaler Inhale 1-2 puffs into the lungs every 6 (six) hours as needed for wheezing or shortness of breath. 12/03/19  Yes Particia Nearing, PA-C  amLODipine (NORVASC) 10 MG tablet Take 1 tablet (10 mg total) by mouth daily. 12/15/19  Yes Storm Frisk, MD  FLUoxetine (PROZAC) 40 MG capsule Take 1 capsule (40 mg total) by mouth daily. 12/14/19  Yes Zena Amos, MD  fluticasone furoate-vilanterol (BREO ELLIPTA) 200-25 MCG/INH AEPB Inhale 1 puff into the lungs daily. 12/10/19  Yes Arvilla Market, DO  gabapentin (NEURONTIN) 400 MG capsule  Take 1 capsule (400 mg total) by mouth at bedtime. 12/14/19  Yes Zena Amos, MD  losartan-hydrochlorothiazide (HYZAAR) 100-25 MG tablet Take 1 tablet by mouth daily. 12/15/19  Yes Storm Frisk, MD  mirtazapine (REMERON) 30 MG tablet Take 1 tablet (30 mg total) by mouth at bedtime. 12/14/19  Yes Zena Amos, MD  QUEtiapine (SEROQUEL) 300 MG tablet Take 2 tablets  (600 mg total) by mouth at bedtime. Take 2 tablets at bedtime Patient taking differently: Take 300 mg by mouth at bedtime. Take 2 tablets at bedtime 12/14/19  Yes Zena Amos, MD  Ipratropium-Albuterol (COMBIVENT RESPIMAT) 20-100 MCG/ACT AERS respimat Inhale 1 puff into the lungs every 6 (six) hours. Patient not taking: No sig reported 12/15/19   Storm Frisk, MD    Physical Exam: Vitals:   04/18/20 1400 04/18/20 1415 04/18/20 1515 04/18/20 1615  BP: 109/81 108/62 113/69 110/71  Pulse: (!) 105 (!) 103 (!) 104   Resp: 14 13 14 16   Temp:      TempSrc:      SpO2: 96% 100% 96%   Weight:      Height:       Wt Readings from Last 3 Encounters:  04/18/20 54.4 kg  12/15/19 54.9 kg  10/09/19 63.5 kg   Body mass index is 20.6 kg/m.  General exam: Pleasant middle-aged African-American female.  Not in physical pain Skin: No rashes, lesions or ulcers. HEENT: Atraumatic, normocephalic, no obvious bleeding Lungs: Clear to auscultation bilaterally CVS: Regular rhythm, tachycardic, no murmur GI/Abd soft, nontender, nondistended, bowel sound present CNS: Alert, awake, knows he is in the hospital, able to follow command.  Slow to respond overall Psychiatry: Depressed look Extremities: No pedal edema, no calf tenderness.  Looks dry overall.     Consult Orders  (From admission, onward)         Start     Ordered   04/18/20 1512  Consult to hospitalist  ALL PATIENTS BEING ADMITTED/HAVING PROCEDURES NEED COVID-19 SCREENING  Once       Comments: ALL PATIENTS BEING ADMITTED/HAVING PROCEDURES NEED COVID-19 SCREENING  Provider:  (Not yet assigned)  Question Answer Comment  Place call to: [facility]   Reason for Consult Admit      04/18/20 1511          Labs on Admission:   CBC: Recent Labs  Lab 04/18/20 0704  WBC 16.1*  HGB 9.4*  HCT 28.9*  MCV 68.0*  PLT 288    Basic Metabolic Panel: Recent Labs  Lab 04/18/20 0755  NA 141  K 4.3  CL 105  CO2 17*  GLUCOSE  197*  BUN 154*  CREATININE 3.89*  CALCIUM 10.6*    Liver Function Tests: Recent Labs  Lab 04/18/20 0755  AST 23  ALT 22  ALKPHOS 105  BILITOT 1.3*  PROT 9.7*  ALBUMIN 4.2   Recent Labs  Lab 04/18/20 0755  LIPASE 624*   No results for input(s): AMMONIA in the last 168 hours.  Cardiac Enzymes: No results for input(s): CKTOTAL, CKMB, CKMBINDEX, TROPONINI in the last 168 hours.  BNP (last 3 results) No results for input(s): BNP in the last 8760 hours.  ProBNP (last 3 results) No results for input(s): PROBNP in the last 8760 hours.  CBG: No results for input(s): GLUCAP in the last 168 hours.  Lipase     Component Value Date/Time   LIPASE 624 (H) 04/18/2020 0755     Urinalysis    Component Value Date/Time  COLORURINE YELLOW 04/18/2020 1219   APPEARANCEUR CLEAR 04/18/2020 1219   LABSPEC 1.010 04/18/2020 1219   PHURINE 5.0 04/18/2020 1219   GLUCOSEU NEGATIVE 04/18/2020 1219   HGBUR SMALL (A) 04/18/2020 1219   BILIRUBINUR NEGATIVE 04/18/2020 1219   KETONESUR 5 (A) 04/18/2020 1219   PROTEINUR NEGATIVE 04/18/2020 1219   UROBILINOGEN 0.2 04/24/2007 1344   NITRITE POSITIVE (A) 04/18/2020 1219   LEUKOCYTESUR SMALL (A) 04/18/2020 1219     Drugs of Abuse  No results found for: LABOPIA, COCAINSCRNUR, LABBENZ, AMPHETMU, THCU, LABBARB    Radiological Exams on Admission: CT ABDOMEN PELVIS WO CONTRAST  Result Date: 04/18/2020 CLINICAL DATA:  Abdominal pain and vomiting for 2 weeks EXAM: CT ABDOMEN AND PELVIS WITHOUT CONTRAST TECHNIQUE: Multidetector CT imaging of the abdomen and pelvis was performed following the standard protocol without IV contrast. COMPARISON:  Chest x-ray from earlier in the same day. FINDINGS: Lower chest: 5 mm nodule is noted in the medial aspect of the right lower lobe best seen on image number 8 of series 6. Patchy ground-glass opacities are noted throughout both lungs. Correlation with COVID-19 testing is recommended. Hepatobiliary: Gallbladder  is well distended with vicariously excreted contrast stone is noted in the region of the gallbladder neck although no wall thickening or pericholecystic fluid is noted. The liver is within normal limits. Pancreas: Unremarkable. No pancreatic ductal dilatation or surrounding inflammatory changes. Spleen: Normal in size without focal abnormality. Adrenals/Urinary Tract: Adrenal glands are within normal limits. Kidneys demonstrate renal calculus on the left which measures approximately 5 mm in dimension. No definitive obstructive changes are seen. Ureters are unremarkable. The bladder is well distended. Stomach/Bowel: Fecal material is noted throughout the colon consistent with a mild degree of constipation. No obstructive changes are seen none. The appendix is within normal limits. Small bowel and stomach are unremarkable. Vascular/Lymphatic: Aortic atherosclerosis. No enlarged abdominal or pelvic lymph nodes. Reproductive: Uterus is significantly enlarged with multiple calcified uterine fibroids. The largest of these measures at least 6 cm in greatest dimension. No adnexal mass is noted. Other: No abdominal wall hernia or abnormality. No abdominopelvic ascites. Musculoskeletal: No acute or significant osseous findings. IMPRESSION: Patchy airspace opacities in the lungs bilaterally consistent with multifocal infiltrate. Correlate with COVID-19 testing. Normal appearing pancreas. Cholelithiasis without complicating factors. Changes of mild constipation. 5 mm nodule in the medial right lung base. No follow-up needed if patient is low-risk. Non-contrast chest CT can be considered in 12 months if patient is high-risk. This recommendation follows the consensus statement: Guidelines for Management of Incidental Pulmonary Nodules Detected on CT Images: From the Fleischner Society 2017; Radiology 2017; 284:228-243. Uterine fibroid change. Electronically Signed   By: Alcide Clever M.D.   On: 04/18/2020 13:00   DG Abdomen  Acute W/Chest  Result Date: 04/18/2020 CLINICAL DATA:  Vomiting for 2 weeks, weakness. EXAM: DG ABDOMEN ACUTE WITH 1 VIEW CHEST COMPARISON:  Chest radiograph 10/09/2019. FINDINGS: Frontal view of the chest is somewhat rotated. Trachea is midline. Heart size normal. Lungs are clear. Minimal biapical pleural thickening. No pleural fluid. Two views of the abdomen show stool throughout the colon. Large calcified mass in the central anatomic pelvis likely represents a uterine fibroid. Bone island in the right femoral neck. IMPRESSION: 1. Stool throughout the colon is indicative of constipation. 2. No acute findings in the chest. Electronically Signed   By: Leanna Battles M.D.   On: 04/18/2020 08:46     ------------------------------------------------------------------------------------------------------ Assessment/Plan: Principal Problem:   Sepsis secondary to UTI [facility]) Active  Problems:   AKI (acute kidney injury) (HCC)  Sepsis - POA -likely secondary to UTI and likely Covid pneumonia. -Meets criteria - tachycardia, leukocytosis, lactic acidosis, AKI with UTI with pneumonia. -Blood culture urine culture sent. -IV Rocephin started. -Blood pressure stable at this time but lactic acid level was elevated. -We will start on LR at 100 mill per hour. Recent Labs  Lab 04/18/20 0704 04/18/20 0708 04/18/20 0906  WBC 16.1*  --   --   LATICACIDVEN  --  2.9* 2.2*   COVID pneumonia History of COPD -Presented with no respite symptoms but profound GI symptoms and weakness.  -COVID test: Positive on admission -Chest imaging: CT chest with patchy airspace opacities in the lungs bilaterally -At this time, patient does not have any respiratory symptoms.  She is maintaining oxygen saturation 100% on room air.  Will obtain CRP, procalcitonin level which probably will guide if there is any need of treatment with antiviral or steroids.   -Continue bronchodilators -Continue airborne/contact isolation  precautions for duration of 3 weeks from the day of diagnosis. -WBC and inflammatory markers trend as below.  Recent Labs  Lab 04/18/20 0704 04/18/20 0708 04/18/20 0755 04/18/20 0805 04/18/20 0906  SARSCOV2NAA  --   --   --  POSITIVE*  --   WBC 16.1*  --   --   --   --   LATICACIDVEN  --  2.9*  --   --  2.2*  ALT  --   --  22  --   --    UTI -Urinalysis with clear yellow urine, small leukocytes, positive nitrite, many bacteria.  Urine culture sent. -Will start on IV Rocephin.  Intractable nausea, vomiting, diarrhea, abdominal pain -Probably GI symptoms due to Covid -Antiemetics, IV fluid  AKI Metabolic acidosis -Secondary to sepsis as well as poor oral intake, dehydration. -Baseline creatinine 1 from 2020.  Creatinine today 3.89 and serum microglobulin 17. -Expect improvement of labs with IV fluids. -Obtain renal ultrasound. Recent Labs    04/18/20 0755  BUN 154*  CREATININE 3.89*  CO2 17*   Essential hypertension -Home meds include amlodipine 10 mg daily, losartan 100 mg daily, HCTZ 25 mg daily. -Blood pressure currently in low normal range.  Keep BP medicines on hold.  Continue to monitor.  Constipation -Ordered for Senokot, MiraLAX, enema.  Can try enema today.  Elevated lipase level -Probably due to recurrent vomiting -Expect improvement with IV fluid and antiemetics Recent Labs  Lab 04/18/20 0755  LIPASE 624*   Anxiety/depression -Home meds include Prozac, Neurontin, Remeron, Seroquel -Continue Remeron, Seroquel.  Resume others gradually as renal function improves.  Mobility: Encourage ambulation.  May need PT eval Code Status:   Code Status: Full Code.  Full code at this time.  Unable to confirm with family. DVT prophylaxis:  Heparin subcu Antimicrobials:  IV Rocephin Fluid: LR at 100 mL/h  Diet:  Diet Order            Diet full liquid Room service appropriate? Yes; Fluid consistency: Thin  Diet effective now               Start on full  liquid diet.  Advance as tolerated  Consultants: None Family Communication:  Unable to reach family given number   Dispo: The patient is from: Home              Anticipated d/c is to: Home Of note, hopefully.  Depends on clinical course  Anticipated d/c date is: 3 days  ------------------------------------------------------------------------------------- Severity of Illness: The appropriate patient status for this patient is INPATIENT. Inpatient status is judged to be reasonable and necessary in order to provide the required intensity of service to ensure the patient's safety. The patient's presenting symptoms, physical exam findings, and initial radiographic and laboratory data in the context of their chronic comorbidities is felt to place them at high risk for further clinical deterioration. Furthermore, it is not anticipated that the patient will be medically stable for discharge from the hospital within 2 midnights of admission. The following factors support the patient status of inpatient.   " The patient's presenting symptoms include nausea vomiting diarrhea, abdomen pain. " The worrisome physical exam findings include lethargy. " The initial radiographic and laboratory data are worrisome because of AKI, UTI. " The chronic co-morbidities include COPD, hypertension.   * I certify that at the point of admission it is my clinical judgment that the patient will require inpatient hospital care spanning beyond 2 midnights from the point of admission due to high intensity of service, high risk for further deterioration and high frequency of surveillance required.*  Signed, Lorin GlassBinaya Mylan Schwarz, MD [facility] 04/18/2020

## 2020-04-19 ENCOUNTER — Encounter (HOSPITAL_COMMUNITY): Payer: Self-pay | Admitting: Family Medicine

## 2020-04-19 DIAGNOSIS — A419 Sepsis, unspecified organism: Secondary | ICD-10-CM

## 2020-04-19 DIAGNOSIS — N390 Urinary tract infection, site not specified: Secondary | ICD-10-CM

## 2020-04-19 LAB — BASIC METABOLIC PANEL
Anion gap: 15 (ref 5–15)
BUN: 66 mg/dL — ABNORMAL HIGH (ref 8–23)
CO2: 22 mmol/L (ref 22–32)
Calcium: 9.2 mg/dL (ref 8.9–10.3)
Chloride: 113 mmol/L — ABNORMAL HIGH (ref 98–111)
Creatinine, Ser: 1.72 mg/dL — ABNORMAL HIGH (ref 0.44–1.00)
GFR, Estimated: 33 mL/min — ABNORMAL LOW (ref >60)
Glucose, Bld: 97 mg/dL (ref 70–99)
Potassium: 3.5 mmol/L (ref 3.5–5.1)
Sodium: 150 mmol/L — ABNORMAL HIGH (ref 135–145)

## 2020-04-19 LAB — CBC
HCT: 20.8 % — ABNORMAL LOW (ref 36.0–46.0)
Hemoglobin: 6.5 g/dL — CL (ref 12.0–15.0)
MCH: 21.7 pg — ABNORMAL LOW (ref 26.0–34.0)
MCHC: 31.3 g/dL (ref 30.0–36.0)
MCV: 69.3 fL — ABNORMAL LOW (ref 80.0–100.0)
Platelets: 128 10*3/uL — ABNORMAL LOW (ref 150–400)
RBC: 3 MIL/uL — ABNORMAL LOW (ref 3.87–5.11)
RDW: 17.1 % — ABNORMAL HIGH (ref 11.5–15.5)
WBC: 6.4 10*3/uL (ref 4.0–10.5)
nRBC: 0 % (ref 0.0–0.2)

## 2020-04-19 LAB — LIPASE, BLOOD: Lipase: 292 U/L — ABNORMAL HIGH (ref 11–51)

## 2020-04-19 LAB — PHOSPHORUS: Phosphorus: 2.3 mg/dL — ABNORMAL LOW (ref 2.5–4.6)

## 2020-04-19 LAB — PROCALCITONIN: Procalcitonin: <0.1 ng/mL

## 2020-04-19 LAB — PREPARE RBC (CROSSMATCH)

## 2020-04-19 LAB — MAGNESIUM
Magnesium: 1.3 mg/dL — ABNORMAL LOW (ref 1.7–2.4)
Magnesium: 1.2 mg/dL — ABNORMAL LOW (ref 1.7–2.4)

## 2020-04-19 LAB — HEMOGLOBIN AND HEMATOCRIT, BLOOD
HCT: 18 % — ABNORMAL LOW (ref 36.0–46.0)
Hemoglobin: 5.7 g/dL — CL (ref 12.0–15.0)

## 2020-04-19 LAB — ABO/RH: ABO/RH(D): AB POS

## 2020-04-19 MED ORDER — POTASSIUM CL IN DEXTROSE 5% 20 MEQ/L IV SOLN
20.0000 meq | INTRAVENOUS | Status: DC
  Administered 2020-04-19 (×2): 20 meq via INTRAVENOUS
  Filled 2020-04-19 (×4): qty 1000

## 2020-04-19 MED ORDER — MAGNESIUM SULFATE 2 GM/50ML IV SOLN
2.0000 g | Freq: Once | INTRAVENOUS | Status: AC
  Administered 2020-04-19: 2 g via INTRAVENOUS
  Filled 2020-04-19: qty 50

## 2020-04-19 MED ORDER — SODIUM CHLORIDE 0.9 % IV SOLN
1.0000 g | INTRAVENOUS | Status: DC
  Administered 2020-04-19: 1 g via INTRAVENOUS
  Filled 2020-04-19 (×2): qty 10

## 2020-04-19 MED ORDER — PANTOPRAZOLE SODIUM 40 MG IV SOLR
40.0000 mg | Freq: Two times a day (BID) | INTRAVENOUS | Status: DC
  Administered 2020-04-19 – 2020-04-22 (×7): 40 mg via INTRAVENOUS
  Filled 2020-04-19 (×7): qty 40

## 2020-04-19 MED ORDER — K PHOS MONO-SOD PHOS DI & MONO 155-852-130 MG PO TABS
500.0000 mg | ORAL_TABLET | Freq: Three times a day (TID) | ORAL | Status: AC
  Administered 2020-04-19 (×3): 500 mg via ORAL
  Filled 2020-04-19 (×3): qty 2

## 2020-04-19 MED ORDER — SODIUM CHLORIDE 0.9% IV SOLUTION: Freq: Once | INTRAVENOUS | Status: AC

## 2020-04-19 NOTE — Progress Notes (Signed)
   04/19/20 1425  Assess: MEWS Score  Temp 98.5 F (36.9 C)  BP 94/60  Pulse Rate (!) 102  Resp 19  Level of Consciousness Alert  SpO2 98 %  O2 Device Room Air  Assess: MEWS Score  MEWS Temp 0  MEWS Systolic 1  MEWS Pulse 1  MEWS RR 0  MEWS LOC 0  MEWS Score 2  MEWS Score Color Yellow  Treat  Pain Scale 0-10  Pain Score 0  Take Vital Signs  Increase Vital Sign Frequency  Yellow: Q 2hr X 2 then Q 4hr X 2, if remains yellow, continue Q 4hrs  Escalate  MEWS: Escalate Yellow: discuss with charge nurse/RN and consider discussing with provider and RRT  Notify: Charge Nurse/RN  Name of Charge Nurse/RN Notified Lorelle Formosa RN  Date Charge Nurse/RN Notified 04/19/20  Time Charge Nurse/RN Notified 1430  Notify: Provider  Provider Name/Title Danford MD  Date Provider Notified 04/19/20  Time Provider Notified 1435  Notification Type Page  Notification Reason Other (Comment) (Yellow MEWS)  Response No new orders  Date of Provider Response 04/19/20  Time of Provider Response 1450  Document  Patient Outcome Stabilized after interventions;Other (Comment)  Progress note created (see row info) Yes   MD was notified no further order. Pt. Closely monitored.

## 2020-04-19 NOTE — Progress Notes (Signed)
CRITICAL VALUE ALERT  Critical Value:  Hemoglobin 6.5  Date & Time Notied:  04/19/2020 10:35 AM  Provider Notified: Danford   Orders Received/Actions taken: waiting for response

## 2020-04-19 NOTE — Progress Notes (Signed)
[facility] PROGRESS NOTE    JAYLIAH BENETT  IJL:[id number] DOB: 09/18/1960 DOA: 04/18/2020 PCP: Arvilla Market, DO      Brief Narrative:  Mrs. Morrish is a 60 y.o. F with HTN, COPD not on O2, and depression who presented with 2 weeks of progressive nausea and vomiting, now weakness and asthenia.  EMS found the patient's blood pressure 84/59, heart rate 130s, WBC 16 K, hemoglobin 9, MCV 68, BUN to creatinine 154/3.89, lipase elevated, lactate elevated, urinalysis with bacteriuria, CT abdomen with Covid-like opacities in the lungs, normal pancreas, mild constipation, no other findings.  She was started on IV fluids and admitted for UTI sepsis, COVID, and acute kidney injury              Assessment & Plan:  Sepsis due to UTI Patient met sepsis criteria on admission with tachycardia, leukocytosis acute metabolic encephalopathy, and elevated lactate.  Source appears to be urinary tract infection.  -Continue ROcephin -Follow blood and urine cultures   COVID-19 pneumonitis without hypoxia Respiratory failure ruled out Symptoms present for >1 week, doubt utility of remdesivir.  No O2 requirmeent of syptoms, will defer anti-inflammatories.   Acute kidney injury Metabolic acidosis Cr 3.89 on admission, ipmroved to 1.7 with fluids.  CT rules out obstruction. -Continue IV fluids -Strict I/Os -Trend UOP -Trend BMP -Hold diuretic and losartan   Blood loss anemia chronicity unclear Microcytic anemia, chronic Hgb at baseline in 2020 was 11.7 g/dL with MCV 66.  I do not see a Hgb electrophoresis in our system, or prior diagnosis of this microcytosis.  Here, her Hgb was 9 g/dL at admission, after fluids has dropped <6 g/dL.  I believe this is mostly dilutional, no melena has bene observed  -Start empiric PPI IV for now -Obtain FOBT  -Transfuse 1 unit blood -Transfusion threshold 7 g/dL  -Check iron stores  -If melena observed, or FOBT+,  will consult GI     Acute metabolic encephalopathy Due to Covid and sepsis and AKI This appears to be improving, able to tell me her name, where she is in some details of her household and situation.  Hypophosphatemia Hypomagnesemia -Supplement Phos, mag -Trend Mag, phos  Hypernatremia -Start hypotonic fluids to keep correction rate 0.5 mmol/L/hr -Trend BMP    COPD No active disease -Continue ICS/LAMA  Hypertension BP low -Hold amlodipine -Hold HCTZ, losartan  Depression -Continue mirtazapine, Seroquel         Disposition: Status is: Inpatient  Remains inpatient appropriate because:Inpatient level of care appropriate due to severity of illness   Dispo: The patient is from: Home              Anticipated d/c is to: SNF              Anticipated d/c date is: 3 days              Patient currently is not medically stable to d/c.   Difficult to place patient No       Level of care: Telemetry       MDM: The below labs and imaging reports were reviewed and summarized above.  Medication management as above.  This is an acute ilness with threat to life and bodily function.    DVT prophylaxis: SCDs  Code Status: FULL Family Communication: Son by phone, no answer, voice mail listed another name besides expected            Subjective: Patient is very weak and tired,  but she has no headache, chest pain, dyspnea, abdominal pain, or vomiting.  No fever.  She seems sleepy and somewhat slowed, but is not confused.  Objective: Vitals:   04/18/20 2341 04/19/20 0120 04/19/20 0320 04/19/20 0742  BP:  101/65 106/69 102/70  Pulse:  (!) 104 (!) 104 (!) 108  Resp:  $Remo'15 15 16  'ZDgmJ$ Temp:  98 F (36.7 C) 97.9 F (36.6 C) 98.3 F (36.8 C)  TempSrc:  Oral Oral Oral  SpO2: 100% 97% 99% 100%  Weight:      Height:        Intake/Output Summary (Last 24 hours) at 04/19/2020 1110 Last data filed at 04/19/2020 0600 Gross per 24 hour  Intake 1752 ml  Output 750  ml  Net 1002 ml   Filed Weights   04/18/20 0622  Weight: 54.4 kg    Examination: General appearance: Thin elderly adult female, asleep, but arouses easily and in no obvious distress.   HEENT: Anicteric, conjunctiva pink, lids and lashes normal. No nasal deformity, discharge, epistaxis.  Lips moist, edentulous, oropharynx tacky dry, no oral lesions, hearing normal.   Skin: Warm and dry.  No jaundice.  No suspicious rashes or lesions. Cardiac: RRR, nl S1-S2, no murmurs appreciated.  Capillary refill is bris.  jvp not visible.  No LE edema.  Radial pulses 2+ and symmetric. Respiratory: Normal respiratory rate and rhythm.  CTAB without rales or wheezes. Abdomen: Abdomen soft.  No TTP or guarding. No ascites, distension, hepatosplenomegaly.   MSK: No deformities or effusions. Neuro: Awake but sleepy.  EOMI, moves all extremities with generalized weakness. Speech fluent.    Psych: Sensorium intact and responding to questions, attention normal. Affect blunted.  Judgment and insight appear impaired.    Data Reviewed: I have personally reviewed following labs and imaging studies:  CBC: Recent Labs  Lab 04/18/20 0704 04/19/20 0941  WBC 16.1* 6.4  HGB 9.4* 6.5*  HCT 28.9* 20.8*  MCV 68.0* 69.3*  PLT 288 545*   Basic Metabolic Panel: Recent Labs  Lab 04/18/20 0755 04/19/20 0941  NA 141 150*  K 4.3 3.5  CL 105 113*  CO2 17* 22  GLUCOSE 197* 97  BUN 154* 66*  CREATININE 3.89* 1.72*  CALCIUM 10.6* 9.2  MG  --  1.3*  PHOS  --  2.3*   GFR: Estimated Creatinine Clearance: 29.5 mL/min (A) (by C-G formula based on SCr of 1.72 mg/dL (H)). Liver Function Tests: Recent Labs  Lab 04/18/20 0755  AST 23  ALT 22  ALKPHOS 105  BILITOT 1.3*  PROT 9.7*  ALBUMIN 4.2   Recent Labs  Lab 04/18/20 0755 04/19/20 0941  LIPASE 624* 292*   No results for input(s): AMMONIA in the last 168 hours. Coagulation Profile: Recent Labs  Lab 04/18/20 0755  INR 1.1   Cardiac Enzymes: No  results for input(s): CKTOTAL, CKMB, CKMBINDEX, TROPONINI in the last 168 hours. BNP (last 3 results) No results for input(s): PROBNP in the last 8760 hours. HbA1C: Recent Labs    04/18/20 1713  HGBA1C 5.3   CBG: No results for input(s): GLUCAP in the last 168 hours. Lipid Profile: No results for input(s): CHOL, HDL, LDLCALC, TRIG, CHOLHDL, LDLDIRECT in the last 72 hours. Thyroid Function Tests: Recent Labs    04/18/20 1713  TSH 0.756   Anemia Panel: No results for input(s): VITAMINB12, FOLATE, FERRITIN, TIBC, IRON, RETICCTPCT in the last 72 hours. Urine analysis:    Component Value Date/Time   COLORURINE YELLOW 04/18/2020 1219  APPEARANCEUR CLEAR 04/18/2020 1219   LABSPEC 1.010 04/18/2020 1219   PHURINE 5.0 04/18/2020 [place] 04/18/2020 1219   HGBUR SMALL (A) 04/18/2020 1219   BILIRUBINUR NEGATIVE 04/18/2020 1219   KETONESUR 5 (A) 04/18/2020 1219   PROTEINUR NEGATIVE 04/18/2020 1219   UROBILINOGEN 0.2 04/24/2007 1344   NITRITE POSITIVE (A) 04/18/2020 1219   LEUKOCYTESUR SMALL (A) 04/18/2020 1219   Sepsis Labs: $RemoveBefo'@[email or web address]'tWmOPVnRNGI$ (procalcitonin:4,lacticacidven:4)  ) Recent Results (from the past 240 hour(s))  SARS CORONAVIRUS 2 (TAT 6-24 HRS) Nasopharyngeal Nasopharyngeal Swab     Status: Abnormal   Collection Time: 04/18/20  8:05 AM   Specimen: Nasopharyngeal Swab  Result Value Ref Range Status   SARS Coronavirus 2 POSITIVE (A) NEGATIVE Final    Comment: (NOTE) SARS-CoV-2 target nucleic acids are DETECTED.  The SARS-CoV-2 RNA is generally detectable in upper and lower respiratory specimens during the acute phase of infection. Positive results are indicative of the presence of SARS-CoV-2 RNA. Clinical correlation with patient history and other diagnostic information is  necessary to determine patient infection status. Positive results do not rule out bacterial infection or co-infection with other viruses.  The expected result is Negative.  Fact Sheet  for Patients: SugarRoll.be  Fact Sheet for Healthcare Providers: https://www.woods-mathews.com/  This test is not yet approved or cleared by the [place] FDA and  has been authorized for detection and/or diagnosis of SARS-CoV-2 by FDA under an Emergency Use Authorization (EUA). This EUA will remain  in effect (meaning this test can be used) for the duration of the COVID-19 declaration under Section 564(b)(1) of the Act, 21 U. S.C. section 360bbb-3(b)(1), unless the authorization is terminated or revoked sooner.   Performed at [facility], [place] [address], [place], [place] [address]   Urine culture     Status: None (Preliminary result)   Collection Time: 04/18/20 12:19 PM   Specimen: In/Out Cath Urine  Result Value Ref Range Status   Specimen Description   Final    IN/OUT CATH URINE Performed at [facility] [address], [place], [place] [address]    Special Requests   Final    NONE Performed at [facility], [place] [address], [place], [place] [address]    Culture   Final    CULTURE REINCUBATED FOR BETTER GROWTH Performed at [facility], [place] [address], [place], [place] [address]    Report Status PENDING  Incomplete         Radiology Studies: CT ABDOMEN PELVIS WO CONTRAST  Result Date: 04/18/2020 CLINICAL DATA:  Abdominal pain and vomiting for 2 weeks EXAM: CT ABDOMEN AND PELVIS WITHOUT CONTRAST TECHNIQUE: Multidetector CT imaging of the abdomen and pelvis was performed following the standard protocol without IV contrast. COMPARISON:  Chest x-ray from earlier in the same day. FINDINGS: Lower chest: 5 mm nodule is noted in the medial aspect of the right lower lobe best seen on image number 8 of series 6. Patchy ground-glass opacities are noted throughout both lungs. Correlation with COVID-19 testing is recommended. Hepatobiliary: Gallbladder is well distended with vicariously  excreted contrast stone is noted in the region of the gallbladder neck although no wall thickening or pericholecystic fluid is noted. The liver is within normal limits. Pancreas: Unremarkable. No pancreatic ductal dilatation or surrounding inflammatory changes. Spleen: Normal in size without focal abnormality. Adrenals/Urinary Tract: Adrenal glands are within normal limits. Kidneys demonstrate renal calculus on the left which measures approximately 5 mm in dimension. No  definitive obstructive changes are seen. Ureters are unremarkable. The bladder is well distended. Stomach/Bowel: Fecal material is noted throughout the colon consistent with a mild degree of constipation. No obstructive changes are seen none. The appendix is within normal limits. Small bowel and stomach are unremarkable. Vascular/Lymphatic: Aortic atherosclerosis. No enlarged abdominal or pelvic lymph nodes. Reproductive: Uterus is significantly enlarged with multiple calcified uterine fibroids. The largest of these measures at least 6 cm in greatest dimension. No adnexal mass is noted. Other: No abdominal wall hernia or abnormality. No abdominopelvic ascites. Musculoskeletal: No acute or significant osseous findings. IMPRESSION: Patchy airspace opacities in the lungs bilaterally consistent with multifocal infiltrate. Correlate with COVID-19 testing. Normal appearing pancreas. Cholelithiasis without complicating factors. Changes of mild constipation. 5 mm nodule in the medial right lung base. No follow-up needed if patient is low-risk. Non-contrast chest CT can be considered in 12 months if patient is high-risk. This recommendation follows the consensus statement: Guidelines for Management of Incidental Pulmonary Nodules Detected on CT Images: From the Fleischner Society 2017; Radiology 2017; 284:228-243. Uterine fibroid change. Electronically Signed   By: Inez Catalina M.D.   On: 04/18/2020 13:00   DG Abdomen Acute W/Chest  Result Date:  04/18/2020 CLINICAL DATA:  Vomiting for 2 weeks, weakness. EXAM: DG ABDOMEN ACUTE WITH 1 VIEW CHEST COMPARISON:  Chest radiograph 10/09/2019. FINDINGS: Frontal view of the chest is somewhat rotated. Trachea is midline. Heart size normal. Lungs are clear. Minimal biapical pleural thickening. No pleural fluid. Two views of the abdomen show stool throughout the colon. Large calcified mass in the central anatomic pelvis likely represents a uterine fibroid. Bone island in the right femoral neck. IMPRESSION: 1. Stool throughout the colon is indicative of constipation. 2. No acute findings in the chest. Electronically Signed   By: Lorin Picket M.D.   On: 04/18/2020 08:46        Scheduled Meds: . sodium chloride   Intravenous Once  . fluticasone furoate-vilanterol  1 puff Inhalation Daily  . Ipratropium-Albuterol  2 puff Inhalation Q6H  . mirtazapine  30 mg Oral QHS  . pantoprazole (PROTONIX) IV  40 mg Intravenous Q12H  . phosphorus  500 mg Oral TID  . QUEtiapine  300 mg Oral QHS   Continuous Infusions: . cefTRIAXone (ROCEPHIN)  IV    . dextrose 5 % with KCl 20 mEq / L    . lactated ringers 100 mL/hr at 04/19/20 0416  . magnesium sulfate bolus IVPB       LOS: 1 day    Time spent: 35 minutes    Edwin Dada, MD [facility] 04/19/2020, 11:10 AM     Please page though [place] or Epic secure chat:  For [employer], Adult nurse

## 2020-04-19 NOTE — Progress Notes (Signed)
CRITICAL VALUE ALERT  Critical Value: Hemoglobin 5.7gm/dl  Date & Time Notied:  09/06/4313  12:30hr  Provider Notified: Danford MD  Orders Received/Actions taken:: 04/19/2020

## 2020-04-19 NOTE — Plan of Care (Signed)

## 2020-04-19 NOTE — Progress Notes (Signed)
Attempted to administer Fleet enema but pt refused despite encouragement.

## 2020-04-20 DIAGNOSIS — N179 Acute kidney failure, unspecified: Secondary | ICD-10-CM

## 2020-04-20 DIAGNOSIS — U071 COVID-19: Secondary | ICD-10-CM | POA: Diagnosis not present

## 2020-04-20 DIAGNOSIS — J449 Chronic obstructive pulmonary disease, unspecified: Secondary | ICD-10-CM | POA: Diagnosis not present

## 2020-04-20 DIAGNOSIS — A419 Sepsis, unspecified organism: Secondary | ICD-10-CM

## 2020-04-20 DIAGNOSIS — E860 Dehydration: Secondary | ICD-10-CM

## 2020-04-20 DIAGNOSIS — N390 Urinary tract infection, site not specified: Secondary | ICD-10-CM

## 2020-04-20 LAB — BASIC METABOLIC PANEL
Anion gap: 13 (ref 5–15)
BUN: 32 mg/dL — ABNORMAL HIGH (ref 8–23)
CO2: 23 mmol/L (ref 22–32)
Calcium: 7.9 mg/dL — ABNORMAL LOW (ref 8.9–10.3)
Chloride: 105 mmol/L (ref 98–111)
Creatinine, Ser: 1.27 mg/dL — ABNORMAL HIGH (ref 0.44–1.00)
GFR, Estimated: 48 mL/min — ABNORMAL LOW (ref >60)
Glucose, Bld: 128 mg/dL — ABNORMAL HIGH (ref 70–99)
Potassium: 3.1 mmol/L — ABNORMAL LOW (ref 3.5–5.1)
Sodium: 141 mmol/L (ref 135–145)

## 2020-04-20 LAB — CBC
HCT: 22.9 % — ABNORMAL LOW (ref 36.0–46.0)
Hemoglobin: 7.5 g/dL — ABNORMAL LOW (ref 12.0–15.0)
MCH: 22.8 pg — ABNORMAL LOW (ref 26.0–34.0)
MCHC: 32.8 g/dL (ref 30.0–36.0)
MCV: 69.6 fL — ABNORMAL LOW (ref 80.0–100.0)
Platelets: 156 10*3/uL (ref 150–400)
RBC: 3.29 MIL/uL — ABNORMAL LOW (ref 3.87–5.11)
RDW: 17.6 % — ABNORMAL HIGH (ref 11.5–15.5)
WBC: 6 10*3/uL (ref 4.0–10.5)
nRBC: 0 % (ref 0.0–0.2)

## 2020-04-20 LAB — URINE CULTURE: Culture: >=100000 — AB

## 2020-04-20 LAB — FERRITIN: Ferritin: 353 ng/mL — ABNORMAL HIGH (ref 11–307)

## 2020-04-20 LAB — IRON AND TIBC
Iron: 66 ug/dL (ref 28–170)
Saturation Ratios: 38 % — ABNORMAL HIGH (ref 10.4–31.8)
TIBC: 173 ug/dL — ABNORMAL LOW (ref 250–450)
UIBC: 107 ug/dL

## 2020-04-20 MED ORDER — POTASSIUM CHLORIDE CRYS ER 20 MEQ PO TBCR
40.0000 meq | EXTENDED_RELEASE_TABLET | Freq: Once | ORAL | Status: AC
  Administered 2020-04-20: 40 meq via ORAL
  Filled 2020-04-20: qty 2

## 2020-04-20 MED ORDER — CEPHALEXIN 500 MG PO CAPS
500.0000 mg | ORAL_CAPSULE | Freq: Two times a day (BID) | ORAL | Status: DC
  Administered 2020-04-20 – 2020-04-22 (×4): 500 mg via ORAL
  Filled 2020-04-20 (×5): qty 1

## 2020-04-20 NOTE — Progress Notes (Signed)
PROGRESS NOTE  SAMMANTHA Cunningham WSF:[id number] DOB: 1960/09/13 DOA: 04/18/2020 PCP: Arvilla Market, DO   LOS: 2 days   Brief narrative: Nicole Cunningham is a 60 y.o. female with past medical history of hypertension, COPD not on home oxygen, depression presented to the hospital with 2 weeks of nausea, vomiting weakness.  Initially in the ED, patient was noted to have low blood pressure with tachycardia and elevated white blood cell count.  Initial BUN to creatinine 154/3.89, lipase elevated, lactate elevated, urinalysis with bacteriuria, CT abdomen with Covid-like opacities in the lungs. She was started on IV fluids and admitted for UTI sepsis, COVID, and acute kidney injury.  Patient was then admitted to hospital for further evaluation and treatment.   Assessment/Plan:  Principal Problem:   Sepsis secondary to UTI [facility]) Active Problems:   AKI (acute kidney injury) (HCC)  Sepsis due to E. coli UTI Present on admission.  Patient did have a tachycardia leukocytosis elevated lactate on presentation.  Continue IV Rocephin.  Continue blood culture urine culture.  Blood cultures negative in 2 days.  Procalcitonin less than 0.10  COVID-19 pneumonitis without hypoxia Not on supplemental oxygen.  Not on remdesivir or Solu-Medrol at this time.  Continue to monitor closely.  COVID-19 Labs  Recent Labs    04/18/20 1630 04/20/20 0403  FERRITIN  --  353*  CRP 7.4*  --     Lab Results  Component Value Date   SARSCOV2NAA POSITIVE (A) 04/18/2020   SARSCOV2NAA NEGATIVE 12/03/2019   SARSCOV2NAA NEGATIVE 11/21/2019   SARSCOV2NAA NEGATIVE 08/14/2019    Acute kidney injury with metabolic acidosis Patient presented with Cr 3.89 on admission, this has improved to 1.2.  Continue to hold losartan and diuretic at this time.  Blood loss anemia chronicity unclear, Microcytic anemia, chronic Baseline hemoglobin of 11.7.  Hemoglobin of 6.5 on presentation.  On PPI.  Patient received packed  RBC transfusion..Obtain FOBT  Acute metabolic encephalopathy Due to Covid and sepsis and AKI.  Improving.  Hypophosphatemia/Hypomagnesemia/hypokalemia Replenished.  Check levels in a.m.  Hypernatremia Improved sodium level at this time at 141.  COPD Continue ICS/LAMA  Continue hypertension Hold amlodipine,HCTZ, losartan  Depression -Continue mirtazapine, Seroquel     DVT prophylaxis: SCDs Start: 04/19/20 1632    Code Status: Full code  Family Communication: None  Status is: Inpatient  Remains inpatient appropriate because:IV treatments appropriate due to intensity of illness or inability to take PO and Inpatient level of care appropriate due to severity of illness   Dispo: The patient is from: Home              Anticipated d/c is to: SNF              Anticipated d/c date is: 3 days              Patient currently is not medically stable to d/c.   Difficult to place patient No  Consultants:  None  Procedures:  None  Anti-infectives:  Marland Kitchen Rocephin IV 1/24>  Anti-infectives (From admission, onward)   Start     Dose/Rate Route Frequency Ordered Stop   04/19/20 1800  cefTRIAXone (ROCEPHIN) 1 g in sodium chloride 0.9 % 100 mL IVPB        1 g 200 mL/hr over 30 Minutes Intravenous Every 24 hours 04/19/20 0137     04/18/20 1715  cefTRIAXone (ROCEPHIN) 1 g in sodium chloride 0.9 % 100 mL IVPB        1 g 200 mL/hr  over 30 Minutes Intravenous  Once 04/18/20 1703 04/18/20 1919      Subjective:  Today, patient was seen and examined at bedside.  No chest pain, shortness of breath but cough+. No nausea or vomiting.  Complains of fatigue and weakness.  Objective: Vitals:   04/20/20 0005 04/20/20 0418  BP: 103/67 109/79  Pulse: 86 (!) 106  Resp: 15 15  Temp: 98.7 F (37.1 C) 98.2 F (36.8 C)  SpO2: 99% 98%    Intake/Output Summary (Last 24 hours) at 04/20/2020 0846 Last data filed at 04/20/2020 0657 Gross per 24 hour  Intake 4299.43 ml  Output 1050  ml  Net 3249.43 ml   Filed Weights   04/18/20 0622  Weight: 54.4 kg   Body mass index is 20.6 kg/m.   Physical Exam:  GENERAL: Patient is alert awake and communicative. Not in obvious distress.  HENT: No scleral pallor or icterus. Pupils equally reactive to light. Oral mucosa is moist NECK: is supple, no gross swelling noted. CHEST: Clear to auscultation. No crackles or wheezes.  Diminished breath sounds bilaterally. CVS: S1 and S2 heard, no murmur. Regular rate and rhythm.  ABDOMEN: Soft, non-tender, bowel sounds are present. EXTREMITIES: No edema. CNS: Cranial nerves are intact. No focal motor deficits.  Generalized weakness. SKIN: warm and dry without rashes.  Data Review: I have personally reviewed the following laboratory data and studies,  CBC: Recent Labs  Lab 04/18/20 0704 04/19/20 0941 04/19/20 1222 04/20/20 0403  WBC 16.1* 6.4  --  6.0  HGB 9.4* 6.5* 5.7* 7.5*  HCT 28.9* 20.8* 18.0* 22.9*  MCV 68.0* 69.3*  --  69.6*  PLT 288 128*  --  156   Basic Metabolic Panel: Recent Labs  Lab 04/18/20 0755 04/19/20 0941 04/19/20 1222 04/20/20 0403  NA 141 150*  --  141  K 4.3 3.5  --  3.1*  CL 105 113*  --  105  CO2 17* 22  --  23  GLUCOSE 197* 97  --  128*  BUN 154* 66*  --  32*  CREATININE 3.89* 1.72*  --  1.27*  CALCIUM 10.6* 9.2  --  7.9*  MG  --  1.3* 1.2*  --   PHOS  --  2.3*  --   --    Liver Function Tests: Recent Labs  Lab 04/18/20 0755  AST 23  ALT 22  ALKPHOS 105  BILITOT 1.3*  PROT 9.7*  ALBUMIN 4.2   Recent Labs  Lab 04/18/20 0755 04/19/20 0941  LIPASE 624* 292*   No results for input(s): AMMONIA in the last 168 hours. Cardiac Enzymes: No results for input(s): CKTOTAL, CKMB, CKMBINDEX, TROPONINI in the last 168 hours. BNP (last 3 results) No results for input(s): BNP in the last 8760 hours.  ProBNP (last 3 results) No results for input(s): PROBNP in the last 8760 hours.  CBG: No results for input(s): GLUCAP in the last 168  hours. Recent Results (from the past 240 hour(s))  Blood culture (routine single)     Status: None (Preliminary result)   Collection Time: 04/18/20  7:55 AM   Specimen: BLOOD  Result Value Ref Range Status   Specimen Description   Final    BLOOD LEFT WRIST Performed at [facility], [address], [place], [place] [address]    Special Requests   Final    BOTTLES DRAWN AEROBIC ONLY Blood Culture results may not be optimal due to an inadequate volume of blood received in culture  bottles Performed at [facility], [address], [place], [place] [address]    Culture   Final    NO GROWTH 1 DAY Performed at [facility], [address], [place], [place] [address]    Report Status PENDING  Incomplete  SARS CORONAVIRUS 2 (TAT 6-24 HRS) Nasopharyngeal Nasopharyngeal Swab     Status: Abnormal   Collection Time: 04/18/20  8:05 AM   Specimen: Nasopharyngeal Swab  Result Value Ref Range Status   SARS Coronavirus 2 POSITIVE (A) NEGATIVE Final    Comment: (NOTE) SARS-CoV-2 target nucleic acids are DETECTED.  The SARS-CoV-2 RNA is generally detectable in upper and lower respiratory specimens during the acute phase of infection. Positive results are indicative of the presence of SARS-CoV-2 RNA. Clinical correlation with patient history and other diagnostic information is  necessary to determine patient infection status. Positive results do not rule out bacterial infection or co-infection with other viruses.  The expected result is Negative.  Fact Sheet for Patients: HairSlick.no  Fact Sheet for Healthcare Providers: quierodirigir.com  This test is not yet approved or cleared by the [place] FDA and  has been authorized for detection and/or diagnosis of SARS-CoV-2 by FDA under an Emergency Use Authorization (EUA). This EUA will remain  in effect (meaning this test can be used) for the  duration of the COVID-19 declaration under Section 564(b)(1) of the Act, 21 U. S.C. section 360bbb-3(b)(1), unless the authorization is terminated or revoked sooner.   Performed at [facility], [address] [place], [place] [address]   Urine culture     Status: Abnormal   Collection Time: 04/18/20 12:19 PM   Specimen: In/Out Cath Urine  Result Value Ref Range Status   Specimen Description   Final    IN/OUT CATH URINE Performed at [facility], [address] [place], [place] [address]    Special Requests   Final    NONE Performed at [facility], [address] [place], [place] [address]    Culture >=100,000 COLONIES/mL ESCHERICHIA COLI (A)  Final   Report Status 04/20/2020 FINAL  Final   Organism ID, Bacteria ESCHERICHIA COLI (A)  Final      Susceptibility   Escherichia coli - MIC*    AMPICILLIN >=32 RESISTANT Resistant     CEFAZOLIN <=4 SENSITIVE Sensitive     CEFEPIME <=0.12 SENSITIVE Sensitive     CEFTRIAXONE <=0.25 SENSITIVE Sensitive     CIPROFLOXACIN <=0.25 SENSITIVE Sensitive     GENTAMICIN <=1 SENSITIVE Sensitive     IMIPENEM <=0.25 SENSITIVE Sensitive     NITROFURANTOIN <=16 SENSITIVE Sensitive     TRIMETH/SULFA <=20 SENSITIVE Sensitive     AMPICILLIN/SULBACTAM 16 INTERMEDIATE Intermediate     PIP/TAZO <=4 SENSITIVE Sensitive     * >=100,000 COLONIES/mL ESCHERICHIA COLI     Studies: CT ABDOMEN PELVIS WO CONTRAST  Result Date: 04/18/2020 CLINICAL DATA:  Abdominal pain and vomiting for 2 weeks EXAM: CT ABDOMEN AND PELVIS WITHOUT CONTRAST TECHNIQUE: Multidetector CT imaging of the abdomen and pelvis was performed following the standard protocol without IV contrast. COMPARISON:  Chest x-ray from earlier in the same day. FINDINGS: Lower chest: 5 mm nodule is noted in the medial aspect of the right lower lobe best seen on image number 8 of series 6. Patchy ground-glass opacities are noted throughout both lungs.  Correlation with COVID-19 testing is recommended. Hepatobiliary: Gallbladder is well distended with vicariously excreted contrast stone is noted in  the region of the gallbladder neck although no wall thickening or pericholecystic fluid is noted. The liver is within normal limits. Pancreas: Unremarkable. No pancreatic ductal dilatation or surrounding inflammatory changes. Spleen: Normal in size without focal abnormality. Adrenals/Urinary Tract: Adrenal glands are within normal limits. Kidneys demonstrate renal calculus on the left which measures approximately 5 mm in dimension. No definitive obstructive changes are seen. Ureters are unremarkable. The bladder is well distended. Stomach/Bowel: Fecal material is noted throughout the colon consistent with a mild degree of constipation. No obstructive changes are seen none. The appendix is within normal limits. Small bowel and stomach are unremarkable. Vascular/Lymphatic: Aortic atherosclerosis. No enlarged abdominal or pelvic lymph nodes. Reproductive: Uterus is significantly enlarged with multiple calcified uterine fibroids. The largest of these measures at least 6 cm in greatest dimension. No adnexal mass is noted. Other: No abdominal wall hernia or abnormality. No abdominopelvic ascites. Musculoskeletal: No acute or significant osseous findings. IMPRESSION: Patchy airspace opacities in the lungs bilaterally consistent with multifocal infiltrate. Correlate with COVID-19 testing. Normal appearing pancreas. Cholelithiasis without complicating factors. Changes of mild constipation. 5 mm nodule in the medial right lung base. No follow-up needed if patient is low-risk. Non-contrast chest CT can be considered in 12 months if patient is high-risk. This recommendation follows the consensus statement: Guidelines for Management of Incidental Pulmonary Nodules Detected on CT Images: From the Fleischner Society 2017; Radiology 2017; 284:228-243. Uterine fibroid change.  Electronically Signed   By: Alcide Clever M.D.   On: 04/18/2020 13:00      Joycelyn Das, MD  [facility] 04/20/2020  If 7PM-7AM, please contact night-coverage

## 2020-04-20 NOTE — Plan of Care (Signed)
  Problem: Activity: Goal: Risk for activity intolerance will decrease Outcome: Progressing   Problem: Safety: Goal: Ability to remain free from injury will improve Outcome: Progressing   Problem: Skin Integrity: Goal: Risk for impaired skin integrity will decrease Outcome: Progressing   

## 2020-04-20 NOTE — Evaluation (Signed)
Physical Therapy Evaluation Patient Details Name: Nicole Cunningham MRN: [id number] DOB: 1960/11/13 Today's Date: 04/20/2020   History of Present Illness  60 yo female admitted with sepsis, UTI, COVID. Hx of MDD, COPD  Clinical Impression  On eval, pt required Min assist for mobility. She walked ~15 feet around the room with use of a RW. She c/o pain in both feel which was affecting her ability to mobilize. Unsure of d/c plan-pt stated she was living in a motel??? Will plan to follow and progress activity as tolerated. Recommend daily mobility in room with nursing assistance to increase activity. Pt is capable of walking to/from bathroom with assistance + RW.     Follow Up Recommendations SNF;Home health PT;Supervision/Assistance - 24 hour (depending on progress-could d/c home with assistance/supervision)    Equipment Recommendations  Rolling walker with 5" wheels    Recommendations for Other Services       Precautions / Restrictions Precautions Precautions: Fall Precaution Comments: admitted with bed bugs Restrictions Weight Bearing Restrictions: No      Mobility  Bed Mobility Overal bed mobility: Needs Assistance Bed Mobility: Supine to Sit;Sit to Supine     Supine to sit: Supervision;HOB elevated Sit to supine: Supervision;HOB elevated   General bed mobility comments: Increased time.    Transfers Overall transfer level: Needs assistance Equipment used: Rolling walker (2 wheeled);None Transfers: Sit to/from Stand Sit to Stand: Min assist         General transfer comment: 3 attempts to stand. Pt having difficulty 2* pain in bil feet. On 3rd attempt pt was able to stand with assistance to power up. Cues for hand placement. Increased time.  Ambulation/Gait Ambulation/Gait assistance: Min guard Gait Distance (Feet): 15 Feet Assistive device: Rolling walker (2 wheeled) Gait Pattern/deviations: Step-through pattern;Decreased stride length     General Gait Details:  Min guard for safety. Cues for proper use of RW. Pt walked around the bed. She c/o some dizziness once seated back on bed. O2 >90% on RA  Stairs            Wheelchair Mobility    Modified Rankin (Stroke Patients Only)       Balance Overall balance assessment: Needs assistance         Standing balance support: Bilateral upper extremity supported Standing balance-[name]: Poor                               Pertinent Vitals/Pain Pain Assessment: Faces Faces Pain Scale: Hurts even more Pain Location: soles of bil feet Pain Descriptors / Indicators: Discomfort;Sore;Tender Pain Intervention(s): Limited activity within patient's tolerance;Monitored during session;Repositioned    Home Living Family/patient expects to be discharged to::  (motel) Living Arrangements: Spouse/significant other                    Prior Function Level of Independence: Independent               Hand Dominance        Extremity/Trunk Assessment   Upper Extremity Assessment Upper Extremity Assessment: Overall WFL for tasks assessed    Lower Extremity Assessment Lower Extremity Assessment: Generalized weakness    Cervical / Trunk Assessment Cervical / Trunk Assessment: Normal  Communication   Communication: No difficulties  Cognition Arousal/Alertness: Awake/alert Behavior During Therapy: WFL for tasks assessed/performed Overall Cognitive Status: Within Functional Limits for tasks assessed  General Comments      Exercises     Assessment/Plan    PT Assessment Patient needs continued PT services  PT Problem List Decreased strength;Decreased mobility;Decreased balance;Pain;Decreased activity tolerance;Decreased knowledge of use of DME       PT Treatment Interventions DME instruction;Gait training;Therapeutic activities;Therapeutic exercise;Balance training;Functional mobility  training;Patient/family education    PT Goals (Current goals can be found in the Care Plan section)  Acute Rehab PT Goals Patient Stated Goal: less pain PT Goal Formulation: With patient Time For Goal Achievement: 05/04/20 Potential to Achieve Goals: Good    Frequency Min 3X/week   Barriers to discharge        Co-evaluation               AM-PAC PT "6 Clicks" Mobility  Outcome Measure Help needed turning from your back to your side while in a flat bed without using bedrails?: A Little Help needed moving from lying on your back to sitting on the side of a flat bed without using bedrails?: A Little Help needed moving to and from a bed to a chair (including a wheelchair)?: A Little Help needed standing up from a chair using your arms (e.g., wheelchair or bedside chair)?: A Little Help needed to walk in hospital room?: A Little Help needed climbing 3-5 steps with a railing? : A Lot 6 Click Score: 17    End of Session Equipment Utilized During Treatment: Gait belt Activity Tolerance: Patient limited by pain Patient left: in bed;with call bell/phone within reach;with bed alarm set   PT Visit Diagnosis: Pain;Difficulty in walking, not elsewhere classified (R26.2) Pain - part of body: Ankle and joints of foot    Time: 1543-1610 PT Time Calculation (min) (ACUTE ONLY): 27 min   Charges:   PT Evaluation $PT Eval Moderate Complexity: 1 Mod PT Treatments $Gait Training: 8-22 mins           Faye Ramsay, PT Acute Rehabilitation  Office: [phone] Pager: [phone]

## 2020-04-21 DIAGNOSIS — U071 COVID-19: Secondary | ICD-10-CM | POA: Diagnosis not present

## 2020-04-21 DIAGNOSIS — E860 Dehydration: Secondary | ICD-10-CM | POA: Diagnosis not present

## 2020-04-21 DIAGNOSIS — N179 Acute kidney failure, unspecified: Secondary | ICD-10-CM | POA: Diagnosis not present

## 2020-04-21 DIAGNOSIS — J449 Chronic obstructive pulmonary disease, unspecified: Secondary | ICD-10-CM | POA: Diagnosis not present

## 2020-04-21 LAB — BASIC METABOLIC PANEL
Anion gap: 10 (ref 5–15)
BUN: 17 mg/dL (ref 8–23)
CO2: 22 mmol/L (ref 22–32)
Calcium: 7.9 mg/dL — ABNORMAL LOW (ref 8.9–10.3)
Chloride: 108 mmol/L (ref 98–111)
Creatinine, Ser: 1.43 mg/dL — ABNORMAL HIGH (ref 0.44–1.00)
GFR, Estimated: 42 mL/min — ABNORMAL LOW (ref >60)
Glucose, Bld: 97 mg/dL (ref 70–99)
Potassium: 4.3 mmol/L (ref 3.5–5.1)
Sodium: 140 mmol/L (ref 135–145)

## 2020-04-21 LAB — CBC
HCT: 22.5 % — ABNORMAL LOW (ref 36.0–46.0)
Hemoglobin: 7.3 g/dL — ABNORMAL LOW (ref 12.0–15.0)
MCH: 22.4 pg — ABNORMAL LOW (ref 26.0–34.0)
MCHC: 32.4 g/dL (ref 30.0–36.0)
MCV: 69 fL — ABNORMAL LOW (ref 80.0–100.0)
Platelets: 136 10*3/uL — ABNORMAL LOW (ref 150–400)
RBC: 3.26 MIL/uL — ABNORMAL LOW (ref 3.87–5.11)
RDW: 17.8 % — ABNORMAL HIGH (ref 11.5–15.5)
WBC: 6.2 10*3/uL (ref 4.0–10.5)
nRBC: 0 % (ref 0.0–0.2)

## 2020-04-21 LAB — MAGNESIUM: Magnesium: 1.2 mg/dL — ABNORMAL LOW (ref 1.7–2.4)

## 2020-04-21 LAB — PHOSPHORUS: Phosphorus: 2 mg/dL — ABNORMAL LOW (ref 2.5–4.6)

## 2020-04-21 MED ORDER — MAGNESIUM OXIDE 400 (241.3 MG) MG PO TABS
400.0000 mg | ORAL_TABLET | Freq: Two times a day (BID) | ORAL | Status: DC
  Administered 2020-04-21 – 2020-04-22 (×3): 400 mg via ORAL
  Filled 2020-04-21 (×3): qty 1

## 2020-04-21 MED ORDER — K PHOS MONO-SOD PHOS DI & MONO 155-852-130 MG PO TABS
500.0000 mg | ORAL_TABLET | Freq: Three times a day (TID) | ORAL | Status: DC
  Administered 2020-04-21 – 2020-04-22 (×4): 500 mg via ORAL
  Filled 2020-04-21 (×4): qty 2

## 2020-04-21 MED ORDER — MAGNESIUM SULFATE 2 GM/50ML IV SOLN
2.0000 g | Freq: Once | INTRAVENOUS | Status: AC
  Administered 2020-04-21: 2 g via INTRAVENOUS
  Filled 2020-04-21: qty 50

## 2020-04-21 NOTE — Plan of Care (Signed)
  Problem: Pain Managment: Goal: General experience of comfort will improve Outcome: Progressing   Problem: Safety: Goal: Ability to remain free from injury will improve Outcome: Progressing   Problem: Skin Integrity: Goal: Risk for impaired skin integrity will decrease Outcome: Progressing   

## 2020-04-21 NOTE — Progress Notes (Addendum)
PROGRESS NOTE  Nicole Cunningham BHA:[id number] DOB: [birth year] DOA: 04/18/2020 PCP: Arvilla Market, DO   LOS: 3 days   Brief narrative: Mrs. Nicole Cunningham is a 60 y.o. female with past medical history of hypertension, COPD not on home oxygen, depression presented to the hospital with 2 weeks of nausea, vomiting weakness.  Initially in the ED, patient was noted to have low blood pressure with tachycardia and elevated white blood cell count.  Initial BUN to creatinine 154/3.89, lipase elevated, lactate elevated, urinalysis with bacteriuria, CT abdomen with Covid-like opacities in the lungs. She was started on IV fluids and admitted for UTI sepsis, COVID, and acute kidney injury.  Patient was then admitted to hospital for further evaluation and treatment.   Assessment/Plan:  Principal Problem:   Sepsis secondary to UTI [facility]) Active Problems:   COPD with chronic bronchitis (HCC)   AKI (acute kidney injury) (HCC)   COVID-19 virus infection  Severe sepsis due to E. coli UTI Present on admission.  Patient did have a tachycardia, leukocytosis elevated lactate of 2.9 on presentation.  Was on  IV Rocephin.   This has been changed to Keflex since 04/20/2020.  Blood cultures negative in 2 days, urine culture with E. coli sensitive to Keflex.  Blood cultures negative in 2 days.    COVID-19 pneumonitis without hypoxia Not on supplemental oxygen.  Not on remdesivir or Solu-Medrol at this time.  Continue to monitor closely.  COVID-19 Labs  Recent Labs    04/18/20 1630 04/20/20 0403  FERRITIN  --  353*  CRP 7.4*  --     Lab Results  Component Value Date   SARSCOV2NAA POSITIVE (A) 04/18/2020   SARSCOV2NAA NEGATIVE 12/03/2019   SARSCOV2NAA NEGATIVE 11/21/2019   SARSCOV2NAA NEGATIVE 08/14/2019    Acute kidney injury with metabolic acidosis Patient presented with Cr 3.89 on admission, this has improved to 1.2.  Continue to hold losartan and diuretic at this time.  Blood loss anemia  chronicity unclear, Microcytic anemia, chronic Baseline hemoglobin of 11.7.  Hemoglobin of 6.5 on presentation.  Hemoglobin of 7.3 at this time.  On PPI.  Patient received packed RBC transfusion.  FOBT pending.  No active bleed.  Acute metabolic encephalopathy Due to Covid and sepsis and AKI.  Improved  Hypophosphatemia/Hypomagnesemia/hypokalemia Replenished.  Patient with a low phosphate of 2.0 and magnesium of 1.2 today.  Potassium 4.3.  Will replace with IV magnesium sulfate, p.o. magnesium oxide and Neutra-Phos today.  Check levels in a.m.  Hypernatremia Improved sodium level at this time at 140.  COPD Continue ICS/LAMA.  Not in acute exacerbation.  Essential hypertension Blood pressure is stable.  Amlodipine,HCTZ, losartan still on hold.  Depression -Continue mirtazapine, Seroquel     DVT prophylaxis: SCDs Start: 04/19/20 1632  Code Status: Full code  Family Communication:  Spoke with the patient's spouse, Mr Peyton Najjar on the phone and updated him about clinical condition of the patient.   Status is: Inpatient  Remains inpatient appropriate because:IV treatments appropriate due to intensity of illness or inability to take PO and Inpatient level of care appropriate due to severity of illness, replenish electrolytes   Dispo: The patient is from: Home              Anticipated d/c is to: SNF as per PT but likely Home with Health, will likely need rolling walker at discharge              Anticipated d/c date is: Likely tomorrow, replenish electrolytes.  Patient currently is not medically stable to d/c.   Difficult to place patient No  Consultants:  None  Procedures:  None  Anti-infectives:  Marland Kitchen Rocephin IV 1/24>1/26 . Keflex 1/26>  Anti-infectives (From admission, onward)   Start     Dose/Rate Route Frequency Ordered Stop   04/20/20 1800  cephALEXin (KEFLEX) capsule 500 mg        500 mg Oral Every 12 hours 04/20/20 1349     04/19/20 1800   cefTRIAXone (ROCEPHIN) 1 g in sodium chloride 0.9 % 100 mL IVPB  Status:  Discontinued        1 g 200 mL/hr over 30 Minutes Intravenous Every 24 hours 04/19/20 0137 04/20/20 1349   04/18/20 1715  cefTRIAXone (ROCEPHIN) 1 g in sodium chloride 0.9 % 100 mL IVPB        1 g 200 mL/hr over 30 Minutes Intravenous  Once 04/18/20 1703 04/18/20 1919      Subjective: Today, patient was seen and examined at bedside.  Patient denies any nausea or vomiting.  Feels little more stronger today.  Denies chest pain, cough or fever.  Objective: Vitals:   04/20/20 2010 04/21/20 0410  BP: 102/71 102/77  Pulse: 98 (!) 109  Resp: 15 15  Temp: 99.1 F (37.3 C) 97.7 F (36.5 C)  SpO2: 100% 100%    Intake/Output Summary (Last 24 hours) at 04/21/2020 0744 Last data filed at 04/21/2020 0600 Gross per 24 hour  Intake 100 ml  Output 1050 ml  Net -950 ml   Filed Weights   04/18/20 0622  Weight: 54.4 kg   Body mass index is 20.6 kg/m.   Physical Exam:  GENERAL: Patient is alert awake and communicative. Not in obvious distress.  Thinly built HENT: No scleral pallor or icterus. Pupils equally reactive to light. Oral mucosa is moist NECK: is supple, no gross swelling noted. CHEST: Clear to auscultation. No crackles or wheezes.  Diminished breath sounds bilaterally. CVS: S1 and S2 heard, no murmur. Regular rate and rhythm.  ABDOMEN: Soft, non-tender, bowel sounds are present. EXTREMITIES: No edema. CNS: Cranial nerves are intact. No focal motor deficits.  Generalized weakness. SKIN: warm and dry without rashes.  Data Review: I have personally reviewed the following laboratory data and studies,  CBC: Recent Labs  Lab 04/18/20 0704 04/19/20 0941 04/19/20 1222 04/20/20 0403 04/21/20 0344  WBC 16.1* 6.4  --  6.0 6.2  HGB 9.4* 6.5* 5.7* 7.5* 7.3*  HCT 28.9* 20.8* 18.0* 22.9* 22.5*  MCV 68.0* 69.3*  --  69.6* 69.0*  PLT 288 128*  --  156 136*   Basic Metabolic Panel: Recent Labs  Lab  04/18/20 0755 04/19/20 0941 04/19/20 1222 04/20/20 0403 04/21/20 0344  NA 141 150*  --  141 140  K 4.3 3.5  --  3.1* 4.3  CL 105 113*  --  105 108  CO2 17* 22  --  23 22  GLUCOSE 197* 97  --  128* 97  BUN 154* 66*  --  32* 17  CREATININE 3.89* 1.72*  --  1.27* 1.43*  CALCIUM 10.6* 9.2  --  7.9* 7.9*  MG  --  1.3* 1.2*  --  1.2*  PHOS  --  2.3*  --   --  2.0*   Liver Function Tests: Recent Labs  Lab 04/18/20 0755  AST 23  ALT 22  ALKPHOS 105  BILITOT 1.3*  PROT 9.7*  ALBUMIN 4.2   Recent Labs  Lab 04/18/20 0755 04/19/20  0941  LIPASE 624* 292*   No results for input(s): AMMONIA in the last 168 hours. Cardiac Enzymes: No results for input(s): CKTOTAL, CKMB, CKMBINDEX, TROPONINI in the last 168 hours. BNP (last 3 results) No results for input(s): BNP in the last 8760 hours.  ProBNP (last 3 results) No results for input(s): PROBNP in the last 8760 hours.  CBG: No results for input(s): GLUCAP in the last 168 hours. Recent Results (from the past 240 hour(s))  Blood culture (routine single)     Status: None (Preliminary result)   Collection Time: 04/18/20  7:55 AM   Specimen: BLOOD  Result Value Ref Range Status   Specimen Description   Final    BLOOD LEFT WRIST Performed at [facility], [address], [place], [place] [address]    Special Requests   Final    BOTTLES DRAWN AEROBIC ONLY Blood Culture results may not be optimal due to an inadequate volume of blood received in culture bottles Performed at [facility], [address] [place], [place] [address]    Culture   Final    NO GROWTH 2 DAYS Performed at [facility], [address] [place], [place] [address]    Report Status PENDING  Incomplete  SARS CORONAVIRUS 2 (TAT 6-24 HRS) Nasopharyngeal Nasopharyngeal Swab     Status: Abnormal   Collection Time: 04/18/20  8:05 AM   Specimen: Nasopharyngeal Swab  Result Value Ref Range Status   SARS Coronavirus 2  POSITIVE (A) NEGATIVE Final    Comment: (NOTE) SARS-CoV-2 target nucleic acids are DETECTED.  The SARS-CoV-2 RNA is generally detectable in upper and lower respiratory specimens during the acute phase of infection. Positive results are indicative of the presence of SARS-CoV-2 RNA. Clinical correlation with patient history and other diagnostic information is  necessary to determine patient infection status. Positive results do not rule out bacterial infection or co-infection with other viruses.  The expected result is Negative.  Fact Sheet for Patients: HairSlick.no  Fact Sheet for Healthcare Providers: quierodirigir.com  This test is not yet approved or cleared by the [place] FDA and  has been authorized for detection and/or diagnosis of SARS-CoV-2 by FDA under an Emergency Use Authorization (EUA). This EUA will remain  in effect (meaning this test can be used) for the duration of the COVID-19 declaration under Section 564(b)(1) of the Act, 21 U. S.C. section 360bbb-3(b)(1), unless the authorization is terminated or revoked sooner.   Performed at [facility], [address], [place], [place] [address]   Urine culture     Status: Abnormal   Collection Time: 04/18/20 12:19 PM   Specimen: In/Out Cath Urine  Result Value Ref Range Status   Specimen Description   Final    IN/OUT CATH URINE Performed at [facility], [address], [place], [place] [address]    Special Requests   Final    NONE Performed at [facility], [address], [place], [place] [address]    Culture >=100,000 COLONIES/mL ESCHERICHIA COLI (A)  Final   Report Status 04/20/2020 FINAL  Final   Organism ID, Bacteria ESCHERICHIA COLI (A)  Final      Susceptibility   Escherichia coli - MIC*    AMPICILLIN >=32 RESISTANT Resistant     CEFAZOLIN <=4 SENSITIVE Sensitive     CEFEPIME <=0.12 SENSITIVE  Sensitive     CEFTRIAXONE <=0.25 SENSITIVE Sensitive     CIPROFLOXACIN <=0.25 SENSITIVE Sensitive  GENTAMICIN <=1 SENSITIVE Sensitive     IMIPENEM <=0.25 SENSITIVE Sensitive     NITROFURANTOIN <=16 SENSITIVE Sensitive     TRIMETH/SULFA <=20 SENSITIVE Sensitive     AMPICILLIN/SULBACTAM 16 INTERMEDIATE Intermediate     PIP/TAZO <=4 SENSITIVE Sensitive     * >=100,000 COLONIES/mL ESCHERICHIA COLI     Studies: No results found.    Joycelyn Das, MD  [facility] 04/21/2020  If 7PM-7AM, please contact night-coverage

## 2020-04-21 NOTE — TOC Initial Note (Signed)
Transition of Care [facility]) - Initial/Assessment Note    Patient Details  Name: Nicole Cunningham MRN: [id number] Date of Birth: 29-Aug-1960  Transition of Care [facility]) CM/SW Contact:    Ida Rogue, LCSW Phone Number: 04/21/2020, 10:47 AM  Clinical Narrative:   Attempted to call patient in follow up to PT recommendation of SNF/24 hour supervision. She did not answer.  Called spouse who confirms that plan is for patient to return home at d/c.  He confirms that he, patient and their adult son are living at the [facility] on [address], someone is with her 24/7, they use public transportation and she will need a ride home, and that he is open to having HH PT come work with her and would appreciate DME rolling walker. Will call Pearson Grippe with [facility] to see if they can take [facility] PT case. TOC will continue to follow during the course of hospitalization.                 Expected Discharge Plan: Home w Home Health Services Barriers to Discharge: Barriers Resolved   Patient Goals and CMS Choice     Choice offered to / list presented to : Spouse  Expected Discharge Plan and Services Expected Discharge Plan: Home w Home Health Services In-house Referral: Clinical Social Work Discharge Planning Services: CM Consult,Medication Assistance Post Acute Care Choice: Home Health Living arrangements for the past 2 months: Hotel/Motel                 DME Arranged: Walker rolling DME Agency: [facility] Date DME Agency Contacted: 04/21/20 Time DME Agency Contacted: 1046 Representative spoke with at DME Agency: Ian Malkin HH Arranged: PT HH Agency: [facility] ([facility])        Prior Living Arrangements/Services Living arrangements for the past 2 months: Hotel/Motel Lives with:: Spouse,Adult Children Patient language and need for interpreter reviewed:: Yes Do you feel safe going back to the place where you live?: Yes      Need for Family Participation in Patient Care: Yes  (Comment) Care giver support system in place?: Yes (comment)   Criminal Activity/Legal Involvement Pertinent to Current Situation/Hospitalization: No - Comment as needed  Activities of Daily Living Home Assistive Devices/Equipment: None ADL Screening (condition at time of admission) Patient's cognitive ability adequate to safely complete daily activities?: No Is the patient deaf or have difficulty hearing?: No Does the patient have difficulty seeing, even when wearing glasses/contacts?: Yes Does the patient have difficulty concentrating, remembering, or making decisions?: No Patient able to express need for assistance with ADLs?: Yes Does the patient have difficulty dressing or bathing?: Yes Independently performs ADLs?: No Communication: Independent Dressing (OT): Needs assistance Is this a change from baseline?: Change from baseline, expected to last >3 days Grooming: Independent Feeding: Independent Bathing: Needs assistance Is this a change from baseline?: Change from baseline, expected to last >3 days Toileting: Needs assistance Is this a change from baseline?: Change from baseline, expected to last >3days In/Out Bed: Needs assistance Is this a change from baseline?: Change from baseline, expected to last >3 days Walks in Home: Needs assistance Is this a change from baseline?: Change from baseline, expected to last >3 days Does the patient have difficulty walking or climbing stairs?: Yes Weakness of Legs: Both Weakness of Arms/Hands: Both  Permission Sought/Granted Permission sought to share information with : Family Supports Permission granted to share information with : Yes, Verbal Permission Granted  Share Information with NAME: Lettie Czarnecki  Permission granted to share info w Relationship: spouse  Permission granted to share info w Contact Information: [phone]  Emotional Assessment   Attitude/Demeanor/Rapport: Engaged Affect (typically observed):  Accepting Orientation: : Oriented to Self,Oriented to Place,Oriented to  Time,Oriented to Situation Alcohol / Substance Use: Not Applicable Psych Involvement: No (comment)  Admission diagnosis:  Dehydration [E86.0] AKI (acute kidney injury) (HCC) [N17.9] COVID-19 virus infection [U07.1] Patient Active Problem List   Diagnosis Date Noted  . COVID-19 virus infection 04/20/2020  . AKI (acute kidney injury) (HCC) 04/18/2020  . Sepsis secondary to UTI (HCC) 04/18/2020  . Hypertensive urgency 12/15/2019  . COPD with chronic bronchitis (HCC) 12/15/2019  . MDD (major depressive disorder), recurrent, in full remission (HCC) 12/14/2019  . Anxiety disorder 12/14/2019   PCP:  Arvilla Market, DO Pharmacy:   [facility] [address] [place]), [place] - [address] [place] [address] Phone: [phone] Fax: [phone]  [id number] - [place], [place] - [address] [place] [address] Phone: [phone] Fax: [phone]  [facility] [place], [place] - [place] [address] [place] [address] Phone: [phone] Fax: [phone]     Social Determinants of Health (SDOH) Interventions    Readmission Risk Interventions No flowsheet data found.

## 2020-04-22 ENCOUNTER — Other Ambulatory Visit (HOSPITAL_COMMUNITY): Payer: Self-pay | Admitting: Internal Medicine

## 2020-04-22 DIAGNOSIS — N179 Acute kidney failure, unspecified: Secondary | ICD-10-CM | POA: Diagnosis not present

## 2020-04-22 DIAGNOSIS — U071 COVID-19: Secondary | ICD-10-CM | POA: Diagnosis not present

## 2020-04-22 DIAGNOSIS — A419 Sepsis, unspecified organism: Secondary | ICD-10-CM | POA: Diagnosis not present

## 2020-04-22 DIAGNOSIS — J449 Chronic obstructive pulmonary disease, unspecified: Secondary | ICD-10-CM

## 2020-04-22 LAB — BPAM RBC
Blood Product Expiration Date: 202202182359
Blood Product Expiration Date: 202202182359
ISSUE DATE / TIME: 202201251326
Unit Type and Rh: 6200
Unit Type and Rh: 6200

## 2020-04-22 LAB — MAGNESIUM: Magnesium: 1.6 mg/dL — ABNORMAL LOW (ref 1.7–2.4)

## 2020-04-22 LAB — TYPE AND SCREEN
ABO/RH(D): AB POS
Antibody Screen: NEGATIVE
Unit division: 0
Unit division: 0

## 2020-04-22 LAB — COMPREHENSIVE METABOLIC PANEL
ALT: 14 U/L (ref 0–44)
AST: 18 U/L (ref 15–41)
Albumin: 3 g/dL — ABNORMAL LOW (ref 3.5–5.0)
Alkaline Phosphatase: 71 U/L (ref 38–126)
Anion gap: 12 (ref 5–15)
BUN: 10 mg/dL (ref 8–23)
CO2: 20 mmol/L — ABNORMAL LOW (ref 22–32)
Calcium: 7.9 mg/dL — ABNORMAL LOW (ref 8.9–10.3)
Chloride: 108 mmol/L (ref 98–111)
Creatinine, Ser: 1.2 mg/dL — ABNORMAL HIGH (ref 0.44–1.00)
GFR, Estimated: 52 mL/min — ABNORMAL LOW (ref >60)
Glucose, Bld: 101 mg/dL — ABNORMAL HIGH (ref 70–99)
Potassium: 3.6 mmol/L (ref 3.5–5.1)
Sodium: 140 mmol/L (ref 135–145)
Total Bilirubin: 0.8 mg/dL (ref 0.3–1.2)
Total Protein: 6.7 g/dL (ref 6.5–8.1)

## 2020-04-22 LAB — CBC
HCT: 24 % — ABNORMAL LOW (ref 36.0–46.0)
Hemoglobin: 7.7 g/dL — ABNORMAL LOW (ref 12.0–15.0)
MCH: 22.8 pg — ABNORMAL LOW (ref 26.0–34.0)
MCHC: 32.1 g/dL (ref 30.0–36.0)
MCV: 71.2 fL — ABNORMAL LOW (ref 80.0–100.0)
Platelets: 164 10*3/uL (ref 150–400)
RBC: 3.37 MIL/uL — ABNORMAL LOW (ref 3.87–5.11)
RDW: 18.2 % — ABNORMAL HIGH (ref 11.5–15.5)
WBC: 6.2 10*3/uL (ref 4.0–10.5)
nRBC: 0 % (ref 0.0–0.2)

## 2020-04-22 LAB — PHOSPHORUS: Phosphorus: 4.3 mg/dL (ref 2.5–4.6)

## 2020-04-22 MED ORDER — LOSARTAN POTASSIUM 100 MG PO TABS: 100.0000 mg | ORAL_TABLET | Freq: Every day | ORAL | 2 refills | Status: DC

## 2020-04-22 MED ORDER — MAGNESIUM OXIDE 400 (241.3 MG) MG PO TABS: 400.0000 mg | ORAL_TABLET | Freq: Two times a day (BID) | ORAL | 0 refills | Status: DC

## 2020-04-22 MED ORDER — PANTOPRAZOLE SODIUM 40 MG PO TBEC: 40.0000 mg | DELAYED_RELEASE_TABLET | Freq: Every day | ORAL | 0 refills | Status: DC

## 2020-04-22 MED ORDER — PANTOPRAZOLE SODIUM 40 MG PO TBEC: 40.0000 mg | DELAYED_RELEASE_TABLET | Freq: Two times a day (BID) | ORAL | Status: DC

## 2020-04-22 MED ORDER — CEPHALEXIN 500 MG PO CAPS: 500.0000 mg | ORAL_CAPSULE | Freq: Two times a day (BID) | ORAL | 0 refills | Status: DC

## 2020-04-22 MED FILL — CEPHALEXIN 500 MG CAPSULE: 500 | 3 days supply | Qty: 6 | Fill #0

## 2020-04-22 MED FILL — LOSARTAN POTASSIUM 100 MG T: 100 | 30 days supply | Qty: 30 | Fill #0

## 2020-04-22 MED FILL — PANTOPRAZOLE SOD DR 40 MG T: 40 | 30 days supply | Qty: 30 | Fill #0

## 2020-04-22 NOTE — Progress Notes (Signed)
PHARMACIST - PHYSICIAN COMMUNICATION  DR:   Tyson Babinski  CONCERNING: IV to Oral Route Change Policy  RECOMMENDATION: This patient is receiving pantoprazole by the intravenous route.  Based on criteria approved by the Pharmacy and Therapeutics Committee, the intravenous medication(s) is/are being converted to the equivalent oral dose form(s).   DESCRIPTION: These criteria include:  The patient is eating (either orally or via tube) and/or has been taking other orally administered medications for a least 24 hours  The patient has no evidence of active gastrointestinal bleeding or impaired GI absorption (gastrectomy, short bowel, patient on TNA or NPO).  If you have questions about this conversion, please contact the Pharmacy Department  []   [phone] )  ( [phone] []   [phone] )  [facility] []   [phone] )  [facility] []   [phone] )  [facility] [x]   [phone] )  [facility]   ( [phone], [facility] 04/22/2020 9:53 AM

## 2020-04-22 NOTE — Progress Notes (Signed)
Went over discharge information w/ pt. Pt verbalized understanding.  

## 2020-04-22 NOTE — TOC Transition Note (Signed)
Transition of Care [facility]) - CM/SW Discharge Note   Patient Details  Name: Nicole Cunningham MRN: [id number] Date of Birth: 11-03-1960  [facility]) CM/SW Contact:  Ida Rogue, LCSW Phone Number: 04/22/2020, 10:30 AM   Clinical Narrative:   Patient who is stable for discharge today is in need of a ride home, delivery of medications, HH PT and DME RW, hospital follow up appointment.   Contacted [facility] for RW, [facility] for [facility] PT, [facility] outpatient pharmacy for meds and SAFE transport for COVID ride home.  Husband alerted. [facility] confirmed they can deliver, Pearson Grippe with [facility] confirmed they can provide [facility] PT.  Ride arranged.  No further needs identified.  TOC sign off.    Final next level of care: Home w Home Health Services Barriers to Discharge: Barriers Resolved   Patient Goals and CMS Choice     Choice offered to / list presented to : Spouse  Discharge Placement                       Discharge Plan and Services In-house Referral: Clinical Social Work Discharge Planning Services: CM Consult,Medication Assistance Post Acute Care Choice: Home Health          DME Arranged: Dan Humphreys rolling DME Agency: [facility] Date DME Agency Contacted: 04/21/20 Time DME Agency Contacted: 1046 Representative spoke with at DME Agency: Ian Malkin HH Arranged: PT HH Agency: [facility]        Social Determinants of Health (SDOH) Interventions     Readmission Risk Interventions No flowsheet data found.

## 2020-04-22 NOTE — Discharge Summary (Signed)
Physician Discharge Summary  Nicole Cunningham YBW:[id number] DOB: Jan 19, 1961 DOA: 04/18/2020  PCP: Arvilla Market, DO  Admit date: 04/18/2020 Discharge date: 04/22/2020  Admitted From: Home  Discharge disposition: Home with Home health   Recommendations for Outpatient Follow-Up:   . Follow up with your primary care provider in one week.  . Check CBC, BMP, magnesium in the next visit . Patient received packed RBC during hospitalization for anemia. Has chronic microcytic anemia. Would benefit from outpatient work-up including GI work-up. She has been started on PPI. Marland Kitchen HCTZ has been discontinued due to dehydration and AKI. Consider adjusting medications for hypertension if needed  Discharge Diagnosis:   Principal Problem:   Sepsis secondary to UTI [facility]) Active Problems:   COPD with chronic bronchitis (HCC)   AKI (acute kidney injury) (HCC)   COVID-19 virus infection   Discharge Condition: Improved.  Diet recommendation:  Regular.  Wound care: None.  Code status: Full.   History of Present Illness:   Nicole Cunningham a 60 y.o. female with past medical history of hypertension, COPD not on home oxygen, depression presented to the hospital with 2 weeks of nausea, vomiting weakness.  Initially in the ED, patient was noted to have low blood pressure with tachycardia and elevated white blood cell count.  Initial BUN to creatinine 154/3.89, lipase elevated, lactate elevated, urinalysis with bacteriuria, CT abdomen with Covid-like opacities in the lungs. She was started on IV fluids and admitted forUTI sepsis, COVID, andacute kidney injury.  Patient was then admitted to hospital for further evaluation and treatment.  Hospital Course:   Following conditions were addressed during hospitalization as listed below,  Severe sepsis due to E. coli UTI Present on admission.  Patient did have a tachycardia, leukocytosis elevated lactate of 2.9 on presentation.  Was on  IV  Rocephin.   This has been changed to Keflex since 04/20/2020.  Blood cultures negative. urine culture with E. coli sensitive to Keflex.  Patient will be given Keflex on  discharge to complete the course.   COVID-19 pneumonitis without hypoxia Not on supplemental oxygen during hospitalization.   No Solu-Medrol or remdesivir given.  Acute kidney injury with metabolic acidosis Patient presented with Cr 3.89 on admission, this has improved to 1.2.   Losartan HCTZ has been discontinued and patient has been continued on losartan only. Will need to monitor BMP as outpatient.  Blood loss anemia chronicity unclear, Microcytic anemia, chronic Baseline hemoglobin of 11.7.  Hemoglobin of 6.5 on presentation.   Received packed RBC during hospitalization. After transfusion hemoglobin has remained stable. Hemoglobin prior to discharge was 7.7. Patient would benefit from follow-up as outpatient. Patient will be continued on PPI on discharge. Could consider GI work-up as outpatient.  Acute metabolic encephalopathy Due to Covid and sepsis and AKI.  Improved  Hypophosphatemia/Hypomagnesemia/hypokalemia Replenished  Hypernatremia Improved sodium level at this time at 140.  COPD Continue ICS/LAMA.  Not in acute exacerbation.  Essential hypertension Blood pressure remained stable.  Amlodipine, losartan on discharge. CTC was discontinued. Might need to adjust blood pressure medications after discharge.  Depression -Continue mirtazapine, Seroquel  Disposition.  At this time, patient is stable for disposition home. Patient will need to follow-up with her primary care physician in 1 week.  Medical Consultants:    None.  Procedures:    None Subjective:   Today, patient was seen and examined at bedside.  Patient denies any nausea vomiting fever shortness of breath chest pain.  Discharge Exam:   Vitals:  04/21/20 2053 04/22/20 0403  BP: 113/78 114/84  Pulse: (!) 108 (!) 110  Resp: 20 18   Temp: 98.4 F (36.9 C) 97.7 F (36.5 C)  SpO2: 100% 100%   Vitals:   04/21/20 0410 04/21/20 1730 04/21/20 2053 04/22/20 0403  BP: 102/77 106/70 113/78 114/84  Pulse: (!) 109 (!) 103 (!) 108 (!) 110  Resp: 15 16 20 18   Temp: 97.7 F (36.5 C) 97.9 F (36.6 C) 98.4 F (36.9 C) 97.7 F (36.5 C)  TempSrc: Oral Oral Oral Oral  SpO2: 100% 99% 100% 100%  Weight:      Height:        General: Alert awake, not in obvious distress, thinly built HENT: pupils equally reacting to light,  No scleral pallor or icterus noted. Oral mucosa is moist.  Chest:  Clear breath sounds.  Diminished breath sounds bilaterally. No crackles or wheezes.  CVS: S1 &S2 heard. No murmur.  Regular rate and rhythm. Abdomen: Soft, nontender, nondistended.  Bowel sounds are heard.   Extremities: No cyanosis, clubbing or edema.  Peripheral pulses are palpable. Psych: Alert, awake and communicative, normal mood CNS:  No cranial nerve deficits.  Power equal in all extremities but generalized weakness Skin: Warm and dry.  No rashes noted.  The results of significant diagnostics from this hospitalization (including imaging, microbiology, ancillary and laboratory) are listed below for reference.     Diagnostic Studies:   CT ABDOMEN PELVIS WO CONTRAST  Result Date: 04/18/2020 CLINICAL DATA:  Abdominal pain and vomiting for 2 weeks EXAM: CT ABDOMEN AND PELVIS WITHOUT CONTRAST TECHNIQUE: Multidetector CT imaging of the abdomen and pelvis was performed following the standard protocol without IV contrast. COMPARISON:  Chest x-ray from earlier in the same day. FINDINGS: Lower chest: 5 mm nodule is noted in the medial aspect of the right lower lobe best seen on image number 8 of series 6. Patchy ground-glass opacities are noted throughout both lungs. Correlation with COVID-19 testing is recommended. Hepatobiliary: Gallbladder is well distended with vicariously excreted contrast stone is noted in the region of the gallbladder neck  although no wall thickening or pericholecystic fluid is noted. The liver is within normal limits. Pancreas: Unremarkable. No pancreatic ductal dilatation or surrounding inflammatory changes. Spleen: Normal in size without focal abnormality. Adrenals/Urinary Tract: Adrenal glands are within normal limits. Kidneys demonstrate renal calculus on the left which measures approximately 5 mm in dimension. No definitive obstructive changes are seen. Ureters are unremarkable. The bladder is well distended. Stomach/Bowel: Fecal material is noted throughout the colon consistent with a mild degree of constipation. No obstructive changes are seen none. The appendix is within normal limits. Small bowel and stomach are unremarkable. Vascular/Lymphatic: Aortic atherosclerosis. No enlarged abdominal or pelvic lymph nodes. Reproductive: Uterus is significantly enlarged with multiple calcified uterine fibroids. The largest of these measures at least 6 cm in greatest dimension. No adnexal mass is noted. Other: No abdominal wall hernia or abnormality. No abdominopelvic ascites. Musculoskeletal: No acute or significant osseous findings. IMPRESSION: Patchy airspace opacities in the lungs bilaterally consistent with multifocal infiltrate. Correlate with COVID-19 testing. Normal appearing pancreas. Cholelithiasis without complicating factors. Changes of mild constipation. 5 mm nodule in the medial right lung base. No follow-up needed if patient is low-risk. Non-contrast chest CT can be considered in 12 months if patient is high-risk. This recommendation follows the consensus statement: Guidelines for Management of Incidental Pulmonary Nodules Detected on CT Images: From the Fleischner Society 2017; Radiology 2017; 284:228-243. Uterine fibroid change. Electronically  Signed   By: Alcide Clever M.D.   On: 04/18/2020 13:00   DG Abdomen Acute W/Chest  Result Date: 04/18/2020 CLINICAL DATA:  Vomiting for 2 weeks, weakness. EXAM: DG ABDOMEN  ACUTE WITH 1 VIEW CHEST COMPARISON:  Chest radiograph 10/09/2019. FINDINGS: Frontal view of the chest is somewhat rotated. Trachea is midline. Heart size normal. Lungs are clear. Minimal biapical pleural thickening. No pleural fluid. Two views of the abdomen show stool throughout the colon. Large calcified mass in the central anatomic pelvis likely represents a uterine fibroid. Bone island in the right femoral neck. IMPRESSION: 1. Stool throughout the colon is indicative of constipation. 2. No acute findings in the chest. Electronically Signed   By: Leanna Battles M.D.   On: 04/18/2020 08:46     Labs:   Basic Metabolic Panel: Recent Labs  Lab 04/18/20 0755 04/19/20 0941 04/19/20 1222 04/20/20 0403 04/21/20 0344 04/22/20 0350  NA 141 150*  --  141 140 140  K 4.3 3.5  --  3.1* 4.3 3.6  CL 105 113*  --  105 108 108  CO2 17* 22  --  23 22 20*  GLUCOSE 197* 97  --  128* 97 101*  BUN 154* 66*  --  32* 17 10  CREATININE 3.89* 1.72*  --  1.27* 1.43* 1.20*  CALCIUM 10.6* 9.2  --  7.9* 7.9* 7.9*  MG  --  1.3* 1.2*  --  1.2* 1.6*  PHOS  --  2.3*  --   --  2.0* 4.3   GFR Estimated Creatinine Clearance: 42.3 mL/min (A) (by C-G formula based on SCr of 1.2 mg/dL (H)). Liver Function Tests: Recent Labs  Lab 04/18/20 0755 04/22/20 0350  AST 23 18  ALT 22 14  ALKPHOS 105 71  BILITOT 1.3* 0.8  PROT 9.7* 6.7  ALBUMIN 4.2 3.0*   Recent Labs  Lab 04/18/20 0755 04/19/20 0941  LIPASE 624* 292*   No results for input(s): AMMONIA in the last 168 hours. Coagulation profile Recent Labs  Lab 04/18/20 0755  INR 1.1    CBC: Recent Labs  Lab 04/18/20 0704 04/19/20 0941 04/19/20 1222 04/20/20 0403 04/21/20 0344 04/22/20 0350  WBC 16.1* 6.4  --  6.0 6.2 6.2  HGB 9.4* 6.5* 5.7* 7.5* 7.3* 7.7*  HCT 28.9* 20.8* 18.0* 22.9* 22.5* 24.0*  MCV 68.0* 69.3*  --  69.6* 69.0* 71.2*  PLT 288 128*  --  156 136* 164   Cardiac Enzymes: No results for input(s): CKTOTAL, CKMB, CKMBINDEX,  TROPONINI in the last 168 hours. BNP: Invalid input(s): POCBNP CBG: No results for input(s): GLUCAP in the last 168 hours. D-Dimer No results for input(s): DDIMER in the last 72 hours. Hgb A1c No results for input(s): HGBA1C in the last 72 hours. Lipid Profile No results for input(s): CHOL, HDL, LDLCALC, TRIG, CHOLHDL, LDLDIRECT in the last 72 hours. Thyroid function studies No results for input(s): TSH, T4TOTAL, T3FREE, THYROIDAB in the last 72 hours.  Invalid input(s): FREET3 Anemia work up Recent Labs    04/20/20 0403  FERRITIN 353*  TIBC 173*  IRON 66   Microbiology Recent Results (from the past 240 hour(s))  Blood culture (routine single)     Status: None (Preliminary result)   Collection Time: 04/18/20  7:55 AM   Specimen: BLOOD  Result Value Ref Range Status   Specimen Description   Final    BLOOD LEFT WRIST Performed at [facility], [address] [place], [place] [address]  Special Requests   Final    BOTTLES DRAWN AEROBIC ONLY Blood Culture results may not be optimal due to an inadequate volume of blood received in culture bottles Performed at [facility], [address] [place], [place] [address]    Culture   Final    NO GROWTH 4 DAYS Performed at [facility], [address] [place], [place] [address]    Report Status PENDING  Incomplete  SARS CORONAVIRUS 2 (TAT 6-24 HRS) Nasopharyngeal Nasopharyngeal Swab     Status: Abnormal   Collection Time: 04/18/20  8:05 AM   Specimen: Nasopharyngeal Swab  Result Value Ref Range Status   SARS Coronavirus 2 POSITIVE (A) NEGATIVE Final    Comment: (NOTE) SARS-CoV-2 target nucleic acids are DETECTED.  The SARS-CoV-2 RNA is generally detectable in upper and lower respiratory specimens during the acute phase of infection. Positive results are indicative of the presence of SARS-CoV-2 RNA. Clinical correlation with patient history and other diagnostic information is   necessary to determine patient infection status. Positive results do not rule out bacterial infection or co-infection with other viruses.  The expected result is Negative.  Fact Sheet for Patients: HairSlick.no  Fact Sheet for Healthcare Providers: quierodirigir.com  This test is not yet approved or cleared by the [place] FDA and  has been authorized for detection and/or diagnosis of SARS-CoV-2 by FDA under an Emergency Use Authorization (EUA). This EUA will remain  in effect (meaning this test can be used) for the duration of the COVID-19 declaration under Section 564(b)(1) of the Act, 21 U. S.C. section 360bbb-3(b)(1), unless the authorization is terminated or revoked sooner.   Performed at [facility], [address], [place], [place] [address]   Urine culture     Status: Abnormal   Collection Time: 04/18/20 12:19 PM   Specimen: In/Out Cath Urine  Result Value Ref Range Status   Specimen Description   Final    IN/OUT CATH URINE Performed at [facility], [address] [place], [place] [address]    Special Requests   Final    NONE Performed at [facility], [address] [place], [place] [address]    Culture >=100,000 COLONIES/mL ESCHERICHIA COLI (A)  Final   Report Status 04/20/2020 FINAL  Final   Organism ID, Bacteria ESCHERICHIA COLI (A)  Final      Susceptibility   Escherichia coli - MIC*    AMPICILLIN >=32 RESISTANT Resistant     CEFAZOLIN <=4 SENSITIVE Sensitive     CEFEPIME <=0.12 SENSITIVE Sensitive     CEFTRIAXONE <=0.25 SENSITIVE Sensitive     CIPROFLOXACIN <=0.25 SENSITIVE Sensitive     GENTAMICIN <=1 SENSITIVE Sensitive     IMIPENEM <=0.25 SENSITIVE Sensitive     NITROFURANTOIN <=16 SENSITIVE Sensitive     TRIMETH/SULFA <=20 SENSITIVE Sensitive     AMPICILLIN/SULBACTAM 16 INTERMEDIATE Intermediate     PIP/TAZO <=4 SENSITIVE Sensitive     *  >=100,000 COLONIES/mL ESCHERICHIA COLI     Discharge Instructions:   Discharge Instructions    Call MD for:  difficulty breathing, headache or visual disturbances   Complete by: As directed    Call MD for:  temperature >100.4   Complete by: As directed    Diet - low sodium heart healthy   Complete by: As directed    Discharge instructions   Complete by: As directed    Follow up with your primary care provider in one week.  Check blood work at that time. Complete the course of antibiotics as prescribed. Your blood pressure medication has changed. Adjust medications with your primary care physician for adequate blood pressure control.   Increase activity slowly   Complete by: As directed      Allergies as of 04/22/2020   No Known Allergies     Medication List    STOP taking these medications   Combivent Respimat 20-100 MCG/ACT Aers respimat Generic drug: Ipratropium-Albuterol   losartan-hydrochlorothiazide 100-25 MG tablet Commonly known as: HYZAAR     TAKE these medications   albuterol 108 (90 Base) MCG/ACT inhaler Commonly known as: VENTOLIN HFA Inhale 1-2 puffs into the lungs every 6 (six) hours as needed for wheezing or shortness of breath.   amLODipine 10 MG tablet Commonly known as: NORVASC Take 1 tablet (10 mg total) by mouth daily.   cephALEXin 500 MG capsule Commonly known as: KEFLEX Take 1 capsule (500 mg total) by mouth every 12 (twelve) hours for 6 doses.   FLUoxetine 40 MG capsule Commonly known as: PROzac Take 1 capsule (40 mg total) by mouth daily.   fluticasone furoate-vilanterol 200-25 MCG/INH Aepb Commonly known as: BREO ELLIPTA Inhale 1 puff into the lungs daily.   gabapentin 400 MG capsule Commonly known as: NEURONTIN Take 1 capsule (400 mg total) by mouth at bedtime.   losartan 100 MG tablet Commonly known as: Cozaar Take 1 tablet (100 mg total) by mouth daily.   magnesium oxide 400 (241.3 Mg) MG tablet Commonly known as: MAG-OX Take 1  tablet (400 mg total) by mouth 2 (two) times daily.   mirtazapine 30 MG tablet Commonly known as: REMERON Take 1 tablet (30 mg total) by mouth at bedtime.   pantoprazole 40 MG tablet Commonly known as: Protonix Take 1 tablet (40 mg total) by mouth daily.   QUEtiapine 300 MG tablet Commonly known as: SEROQUEL Take 2 tablets (600 mg total) by mouth at bedtime. Take 2 tablets at bedtime What changed: how much to take            Durable Medical Equipment  (From admission, onward)         Start     Ordered   04/22/20 1013  For home use only DME Walker rolling  Once       Question Answer Comment  Walker: With 5 Inch Wheels   Patient needs a walker to treat with the following condition Ambulatory dysfunction      04/22/20 1012          Follow-up Information    Primary Care at [facility] Follow up on 04/25/2020.   Specialty: Family Medicine Why: Monday at 8:30 for your hospital follow up appointment.  This is a virtual appointment.  they will call you Contact information: [address], [address] [place] [address] [phone]               Time coordinating discharge: 39 minutes  Signed:  Loys Shugars  [facility] 04/22/2020, 10:14 AM

## 2020-04-23 LAB — CULTURE, BLOOD (SINGLE): Culture: NO GROWTH

## 2020-04-25 ENCOUNTER — Telehealth: Payer: Self-pay

## 2020-04-25 ENCOUNTER — Encounter: Payer: Self-pay | Admitting: Physician Assistant

## 2020-04-25 ENCOUNTER — Telehealth (INDEPENDENT_AMBULATORY_CARE_PROVIDER_SITE_OTHER): Payer: Self-pay | Admitting: Physician Assistant

## 2020-04-25 ENCOUNTER — Other Ambulatory Visit: Payer: Self-pay

## 2020-04-25 DIAGNOSIS — N390 Urinary tract infection, site not specified: Secondary | ICD-10-CM

## 2020-04-25 DIAGNOSIS — D649 Anemia, unspecified: Secondary | ICD-10-CM

## 2020-04-25 DIAGNOSIS — I10 Essential (primary) hypertension: Secondary | ICD-10-CM

## 2020-04-25 DIAGNOSIS — Z13220 Encounter for screening for lipoid disorders: Secondary | ICD-10-CM

## 2020-04-25 DIAGNOSIS — Z114 Encounter for screening for human immunodeficiency virus [HIV]: Secondary | ICD-10-CM

## 2020-04-25 DIAGNOSIS — Z1159 Encounter for screening for other viral diseases: Secondary | ICD-10-CM

## 2020-04-25 DIAGNOSIS — A419 Sepsis, unspecified organism: Secondary | ICD-10-CM

## 2020-04-25 DIAGNOSIS — Z8616 Personal history of COVID-19: Secondary | ICD-10-CM

## 2020-04-25 DIAGNOSIS — Z8619 Personal history of other infectious and parasitic diseases: Secondary | ICD-10-CM

## 2020-04-25 DIAGNOSIS — N179 Acute kidney failure, unspecified: Secondary | ICD-10-CM

## 2020-04-25 NOTE — Progress Notes (Signed)
Patient verified DOB Patient has not eaten or taken medication today. Patient complains of runny nose with clear mucous. Patient denies fevers, N/V/ diarrhea. Patient complains of taste being altered and smell being present. Patient denies Body aches and HA's.

## 2020-04-25 NOTE — Telephone Encounter (Signed)
[facility] information shared with provider, please advise.

## 2020-04-25 NOTE — Progress Notes (Signed)
Established Patient Office Visit  Subjective:  Patient ID: Nicole Cunningham, female    DOB: Apr 16, 1960  Age: 60 y.o. MRN: [id number]  CC: No chief complaint on file.  Virtual Visit via Telephone Note  I connected with Nicole Cunningham on 04/25/20 at  8:30 AM EST by telephone and verified that I am speaking with the correct person using two identifiers.  Location: Patient: Home Provider: Primary Care at [facility]   I discussed the limitations, risks, security and privacy concerns of performing an evaluation and management service by telephone and the availability of in person appointments. I also discussed with the patient that there may be a patient responsible charge related to this service. The patient expressed understanding and agreed to proceed.   History of Present Illness: Nicole Cunningham reports that she was hospitalized from January 24 April 22, 2020.  Hospital course:   Hospital Course:   Following conditions were addressed during hospitalization as listed below,  Severe sepsis due to E. coli UTI Present on admission. Patient did have a tachycardia,leukocytosis elevated lactate of 2.9on presentation. Was onIV Rocephin. This has been changed to Keflex since 04/20/2020. Blood cultures negative.urine culturewith E. coli sensitive to Keflex. Patient will be given Keflex on  discharge to complete the course.  COVID-19 pneumonitis without hypoxia Not on supplemental oxygen during hospitalization.  No Solu-Medrol or remdesivir given.  Acute kidney injury with metabolic acidosis Patient presented with Cr 3.89 on admission, this has improved to 1.2.  Losartan HCTZ has been discontinued and patient has been continued on losartan only. Will need to monitor BMP as outpatient.  Blood loss anemia chronicity unclear, Microcytic anemia, chronic Baseline hemoglobin of 11.7. Hemoglobin of 6.5 on presentation.  Received packed RBC during hospitalization. After  transfusion hemoglobin has remained stable. Hemoglobin prior to discharge was 7.7. Patient would benefit from follow-up as outpatient. Patient will be continued on PPI on discharge. Could consider GI work-up as outpatient.  Acute metabolic encephalopathy Due to Covid and sepsis and AKI. Improved  Hypophosphatemia/Hypomagnesemia/hypokalemia Replenished  Hypernatremia Improved sodium level at this time at 140.  COPD Continue ICS/LAMA.Not in acute exacerbation.  Essentialhypertension Blood pressure remained stable.Amlodipine, losartan on discharge. CTC was discontinued. Might need to adjust blood pressure medications after discharge.  Depression -Continue mirtazapine, Seroquel     Reports that since she has been home she has been feeling much better, states that she has not been checking her blood pressure.  States that she was unaware that she was diagnosed with COVID while she was in the hospital.  States that she continues to have a runny nose with clear congestion   Observations/Objective:  Medical history and current medications reviewed, no physical exam completed    Past Medical History:  Diagnosis Date  . Bronchitis   . COPD (chronic obstructive pulmonary disease) (HCC)   . Hypertension     History reviewed. No pertinent surgical history.  Family History  Problem Relation Age of Onset  . Diabetes Mother   . Diabetes Father     Social History   Socioeconomic History  . Marital status: Married    Spouse name: Not on file  . Number of children: Not on file  . Years of education: Not on file  . Highest education level: Not on file  Occupational History  . Not on file  Tobacco Use  . Smoking status: Never Smoker  . Smokeless tobacco: Never Used  Vaping Use  . Vaping Use: Never used  Substance and  Sexual Activity  . Alcohol use: Not Currently  . Drug use: Never  . Sexual activity: Not Currently  Other Topics Concern  . Not on file   Social History Narrative  . Not on file   Social Determinants of Health   Financial Resource Strain: Not on file  Food Insecurity: Not on file  Transportation Needs: Not on file  Physical Activity: Not on file  Stress: Not on file  Social Connections: Not on file  Intimate Partner Violence: Not on file    Outpatient Medications Prior to Visit  Medication Sig Dispense Refill  . albuterol (VENTOLIN HFA) 108 (90 Base) MCG/ACT inhaler Inhale 1-2 puffs into the lungs every 6 (six) hours as needed for wheezing or shortness of breath. 18 g 0  . amLODipine (NORVASC) 10 MG tablet Take 1 tablet (10 mg total) by mouth daily. 90 tablet 3  . cephALEXin (KEFLEX) 500 MG capsule Take 1 capsule (500 mg total) by mouth every 12 (twelve) hours for 6 doses. 6 capsule 0  . FLUoxetine (PROZAC) 40 MG capsule Take 1 capsule (40 mg total) by mouth daily. 30 capsule 2  . fluticasone furoate-vilanterol (BREO ELLIPTA) 200-25 MCG/INH AEPB Inhale 1 puff into the lungs daily. 28 each 3  . gabapentin (NEURONTIN) 400 MG capsule Take 1 capsule (400 mg total) by mouth at bedtime. 30 capsule 2  . losartan (COZAAR) 100 MG tablet Take 1 tablet (100 mg total) by mouth daily. 30 tablet 2  . magnesium oxide (MAG-OX) 400 (241.3 Mg) MG tablet Take 1 tablet (400 mg total) by mouth 2 (two) times daily. 60 tablet 0  . mirtazapine (REMERON) 30 MG tablet Take 1 tablet (30 mg total) by mouth at bedtime. 30 tablet 2  . pantoprazole (PROTONIX) 40 MG tablet Take 1 tablet (40 mg total) by mouth daily. 30 tablet 0  . QUEtiapine (SEROQUEL) 300 MG tablet Take 2 tablets (600 mg total) by mouth at bedtime. Take 2 tablets at bedtime (Patient taking differently: Take 300 mg by mouth at bedtime. Take 2 tablets at bedtime) 60 tablet 2   No facility-administered medications prior to visit.    No Known Allergies  ROS Review of Systems  Constitutional: Negative for chills, fatigue and fever.  HENT: Positive for rhinorrhea. Negative for sinus  pressure, sneezing, sore throat and trouble swallowing.   Eyes: Negative.   Respiratory: Negative for cough and shortness of breath.   Gastrointestinal: Negative for abdominal pain, diarrhea, nausea and vomiting.  Endocrine: Negative.   Musculoskeletal: Negative for myalgias.  Skin: Negative.   Allergic/Immunologic: Negative.   Neurological: Negative for headaches.  Hematological: Negative.   Psychiatric/Behavioral: Negative.       Objective:     There were no vitals taken for this visit. Wt Readings from Last 3 Encounters:  04/18/20 120 lb (54.4 kg)  12/15/19 121 lb (54.9 kg)  10/09/19 140 lb (63.5 kg)     Health Maintenance Due  Topic Date Due  . COVID-19 Vaccine (1) Never done  . PAP SMEAR-Modifier  Never done  . MAMMOGRAM  Never done  . COLON CANCER SCREENING ANNUAL FOBT  Never done  . INFLUENZA VACCINE  Never done    There are no preventive care reminders to display for this patient.  Lab Results  Component Value Date   TSH 0.756 04/18/2020   Lab Results  Component Value Date   WBC 6.2 04/22/2020   HGB 7.7 (L) 04/22/2020   HCT 24.0 (L) 04/22/2020   MCV 71.2 (  L) 04/22/2020   PLT 164 04/22/2020   Lab Results  Component Value Date   NA 140 04/22/2020   K 3.6 04/22/2020   CO2 20 (L) 04/22/2020   GLUCOSE 101 (H) 04/22/2020   BUN 10 04/22/2020   CREATININE 1.20 (H) 04/22/2020   BILITOT 0.8 04/22/2020   ALKPHOS 71 04/22/2020   AST 18 04/22/2020   ALT 14 04/22/2020   PROT 6.7 04/22/2020   ALBUMIN 3.0 (L) 04/22/2020   CALCIUM 7.9 (L) 04/22/2020   ANIONGAP 12 04/22/2020   No results found for: CHOL No results found for: HDL No results found for: LDLCALC No results found for: TRIG No results found for: CHOLHDL Lab Results  Component Value Date   HGBA1C 5.3 04/18/2020      Assessment & Plan:   Problem List Items Addressed This Visit      Cardiovascular and Mediastinum   Essential hypertension     Genitourinary   AKI (acute kidney injury)  (HCC)   Relevant Orders   Comp. Metabolic Panel (12)     Other   Sepsis secondary to UTI (HCC)   History of COVID-19   History of sepsis - Primary   Anemia   Relevant Orders   CBC with Differential/Platelet   Hypomagnesemia   Relevant Orders   Magnesium    Other Visit Diagnoses    Screening for HIV without presence of risk factors       Relevant Orders   HIV antibody (with reflex)   Encounter for HCV screening test for low risk patient       Relevant Orders   HCV Ab w/Rflx to Verification   Screening, lipid       Relevant Orders   Lipid panel      Assessment and Plan: 1. History of sepsis Patient to have in person visit, fasting labs, on Thursday, April 28, 2020.  2. AKI (acute kidney injury) (HCC)  - Comp. Metabolic Panel (12); Future  3. Sepsis secondary to UTI (HCC)   4. Essential hypertension Encouraged patient to bring all medications to next office visit, check blood pressure twice a day, keep a written log and bring that to next office visit for further evaluation  5. History of COVID-19   6. Anemia, unspecified type  - CBC with Differential/Platelet; Future  7. Hypomagnesemia  - Magnesium; Future  8. Screening for HIV without presence of risk factors  - HIV antibody (with reflex); Future  9. Encounter for HCV screening test for low risk patient  - HCV Ab w/Rflx to Verification; Future  10. Screening, lipid  - Lipid panel; Future  Follow-up considerations from hospital note  Follow up with your primary care provider in one week.   Check CBC, BMP, magnesium in the next visit  Patient received packed RBC during hospitalization for anemia. Has chronic microcytic anemia. Would benefit from outpatient work-up including GI work-up. She has been started on PPI.  HCTZ has been discontinued due to dehydration and AKI. Consider adjusting medications for hypertension if needed  Follow Up Instructions:    I discussed the assessment and  treatment plan with the patient. The patient was provided an opportunity to ask questions and all were answered. The patient agreed with the plan and demonstrated an understanding of the instructions.   The patient was advised to call back or seek an in-person evaluation if the symptoms worsen or if the condition fails to improve as anticipated.  I provided 25 minutes of non-face-to-face time during this  encounter.   Nicole Kaman S Cadence Haslam, Nicole Cunningham  No orders of the defined types were placed in this encounter.   Follow-up: Return in about 3 days (around 04/28/2020).    Nicole Knudsen Nuvia Hileman, Nicole Cunningham

## 2020-04-26 NOTE — Patient Instructions (Signed)
No AVS created, patient declines my chart 

## 2020-05-04 ENCOUNTER — Telehealth: Payer: Self-pay

## 2020-05-04 NOTE — Telephone Encounter (Signed)
Verbal orders requested/given for PT 1x/wk x 2wks starting today.

## 2020-05-04 NOTE — Telephone Encounter (Signed)
Per [facility] RN and chart review pt needs to schedule in person visit w/ PCP as soon as possible for BP check/follow-up labs. Pls contact pt and schedule, thanks

## 2020-06-13 MED FILL — $BREO ELLIPTA 200-25 MCG IN: 200-25 | 30 days supply | Qty: 60 | Fill #3

## 2020-06-13 MED FILL — LOSARTAN POTASSIUM 100 MG T: 100 | 30 days supply | Qty: 30 | Fill #0

## 2020-07-05 ENCOUNTER — Telehealth (HOSPITAL_COMMUNITY): Payer: Self-pay | Admitting: *Deleted

## 2020-07-05 NOTE — Telephone Encounter (Signed)
Received a refill request for patients quetiapine. Reviewed record and hasnt been seen since Sept 21. She no showed for a scheduled appt in Dec and this clinic has not had any contact with her since being seen in Sept. Will not refill this Rx due to lack of follow up.

## 2020-07-19 ENCOUNTER — Other Ambulatory Visit: Payer: Self-pay

## 2020-07-19 MED FILL — Losartan Potassium Tab 100 MG: ORAL | 30 days supply | Qty: 30 | Fill #0 | Status: AC

## 2020-07-20 ENCOUNTER — Other Ambulatory Visit: Payer: Self-pay

## 2020-07-20 ENCOUNTER — Other Ambulatory Visit: Payer: Self-pay | Admitting: Internal Medicine

## 2020-07-20 DIAGNOSIS — J441 Chronic obstructive pulmonary disease with (acute) exacerbation: Secondary | ICD-10-CM

## 2020-07-20 MED ORDER — FLUTICASONE FUROATE-VILANTEROL 200-25 MCG/INH IN AEPB
1.0000 | INHALATION_SPRAY | Freq: Every day | RESPIRATORY_TRACT | 3 refills | Status: DC
  Filled 2020-07-20 – 2020-08-05 (×3): qty 60, 30d supply, fill #0
  Filled 2020-09-05: qty 60, 30d supply, fill #1
  Filled 2020-09-30 (×2): qty 60, 30d supply, fill #2

## 2020-07-21 ENCOUNTER — Other Ambulatory Visit: Payer: Self-pay

## 2020-07-28 ENCOUNTER — Other Ambulatory Visit: Payer: Self-pay

## 2020-08-05 ENCOUNTER — Other Ambulatory Visit: Payer: Self-pay

## 2020-08-16 ENCOUNTER — Other Ambulatory Visit: Payer: Self-pay

## 2020-08-16 ENCOUNTER — Telehealth (INDEPENDENT_AMBULATORY_CARE_PROVIDER_SITE_OTHER): Payer: No Payment, Other | Admitting: Psychiatry

## 2020-08-16 ENCOUNTER — Encounter (HOSPITAL_COMMUNITY): Payer: Self-pay | Admitting: Psychiatry

## 2020-08-16 ENCOUNTER — Other Ambulatory Visit (HOSPITAL_COMMUNITY): Payer: Self-pay

## 2020-08-16 DIAGNOSIS — F419 Anxiety disorder, unspecified: Secondary | ICD-10-CM | POA: Diagnosis not present

## 2020-08-16 DIAGNOSIS — F3342 Major depressive disorder, recurrent, in full remission: Secondary | ICD-10-CM | POA: Diagnosis not present

## 2020-08-16 DIAGNOSIS — F3341 Major depressive disorder, recurrent, in partial remission: Secondary | ICD-10-CM

## 2020-08-16 MED ORDER — FLUOXETINE HCL 20 MG PO CAPS: ORAL_CAPSULE | ORAL | 1 refills | Status: DC

## 2020-08-16 MED ORDER — QUETIAPINE FUMARATE 300 MG PO TABS: 300.0000 mg | ORAL_TABLET | Freq: Every day | ORAL | 1 refills | Status: DC

## 2020-08-16 MED ORDER — GABAPENTIN 400 MG PO CAPS
400.0000 mg | ORAL_CAPSULE | Freq: Every day | ORAL | 2 refills | Status: DC
Start: 2020-08-16 — End: 2020-10-14

## 2020-08-16 MED ORDER — MIRTAZAPINE 30 MG PO TABS: 30.0000 mg | ORAL_TABLET | Freq: Every day | ORAL | 2 refills | Status: DC

## 2020-08-16 NOTE — Progress Notes (Signed)
BH MD/PA/NP OP Progress Note  Virtual Visit via Telephone Note  I connected with Nicole Cunningham on 08/16/20 at 11:20 AM EDT by telephone and verified that I am speaking with the correct person using two identifiers.  Location: Patient: home Provider: Clinic   I discussed the limitations, risks, security and privacy concerns of performing an evaluation and management service by telephone and the availability of in person appointments. I also discussed with the patient that there may be a patient responsible charge related to this service. The patient expressed understanding and agreed to proceed.   I provided 16 minutes of non-face-to-face time during this encounter.    08/16/2020 11:27 AM Nicole Cunningham  MRN:  [id number]  Chief Complaint:  " I have been feeling depressed."  HPI: Patient stated that she has been going through a lot as she has a lot of stress going on in her life.  She did not elaborate any details.  She stated that she has not been able to keep her appointments with the writer in the past. She said that she has been feeling sad and she does not have the energy to do anything.  She wants just wants to lay in her bed.  She is also having some difficulty in falling asleep but is usually able to stay asleep. Her appetite is slightly reduced.  She denied having any suicidal ideations. She was agreeable to going up on the dose of Prozac to see if that would help her depression symptoms better.  She was agreeable to continue the remaining regimen as same for now.   Visit Diagnosis:    ICD-10-CM   1. MDD (major depressive disorder), recurrent, in full remission (HCC)  F33.42   2. Anxiety disorder, unspecified type  F41.9 gabapentin (NEURONTIN) 400 MG capsule  3. MDD (major depressive disorder), recurrent, in partial remission (HCC)  F33.41 FLUoxetine (PROZAC) 20 MG capsule    gabapentin (NEURONTIN) 400 MG capsule    mirtazapine (REMERON) 30 MG tablet    QUEtiapine  (SEROQUEL) 300 MG tablet    Past Psychiatric History: MDD, anxiety   Past Medical History:  Past Medical History:  Diagnosis Date  . Bronchitis   . COPD (chronic obstructive pulmonary disease) (HCC)   . Hypertension    History reviewed. No pertinent surgical history.  Family Psychiatric History: denied  Family History:  Family History  Problem Relation Age of Onset  . Diabetes Mother   . Diabetes Father     Social History:  Social History   Socioeconomic History  . Marital status: Married    Spouse name: Not on file  . Number of children: Not on file  . Years of education: Not on file  . Highest education level: Not on file  Occupational History  . Not on file  Tobacco Use  . Smoking status: Never Smoker  . Smokeless tobacco: Never Used  Vaping Use  . Vaping Use: Never used  Substance and Sexual Activity  . Alcohol use: Not Currently  . Drug use: Never  . Sexual activity: Not Currently  Other Topics Concern  . Not on file  Social History Narrative  . Not on file   Social Determinants of Health   Financial Resource Strain: Not on file  Food Insecurity: Not on file  Transportation Needs: Not on file  Physical Activity: Not on file  Stress: Not on file  Social Connections: Not on file    Allergies: No Known Allergies  Metabolic Disorder Labs:  Lab Results  Component Value Date   HGBA1C 5.3 04/18/2020   MPG 105.41 04/18/2020   No results found for: PROLACTIN No results found for: CHOL, TRIG, HDL, CHOLHDL, VLDL, LDLCALC Lab Results  Component Value Date   TSH 0.756 04/18/2020    Therapeutic Level Labs: No results found for: LITHIUM No results found for: VALPROATE No components found for:  CBMZ  Current Medications: Current Outpatient Medications  Medication Sig Dispense Refill  . FLUoxetine (PROZAC) 20 MG capsule Take 3 capsules daily (60 mg) 90 capsule 1  . albuterol (VENTOLIN HFA) 108 (90 Base) MCG/ACT inhaler Inhale 1-2 puffs into the  lungs every 6 (six) hours as needed for wheezing or shortness of breath. 18 g 0  . amLODipine (NORVASC) 10 MG tablet TAKE 1 TABLET (10 MG TOTAL) BY MOUTH DAILY. 90 tablet 3  . cephALEXin (KEFLEX) 500 MG capsule TAKE 1 CAPSULE BY MOUTH EVERY 12 HOURS FOR 6 DOSES. 6 capsule 0  . fluticasone furoate-vilanterol (BREO ELLIPTA) 200-25 MCG/INH AEPB Inhale 1 puff into the lungs daily. 60 each 3  . gabapentin (NEURONTIN) 400 MG capsule Take 1 capsule (400 mg total) by mouth at bedtime. 30 capsule 2  . losartan (COZAAR) 100 MG tablet TAKE 1 TABLET BY MOUTH DAILY 30 tablet 1  . magnesium oxide (MAG-OX) 400 (241.3 Mg) MG tablet TAKE 1 TABLET BY MOUTH 2 TIMES DAILY 60 tablet 0  . mirtazapine (REMERON) 30 MG tablet Take 1 tablet (30 mg total) by mouth at bedtime. 30 tablet 2  . pantoprazole (PROTONIX) 40 MG tablet TAKE 1 TABLET BY MOUTH ONCE A DAY 30 tablet 0  . QUEtiapine (SEROQUEL) 300 MG tablet Take 1 tablet (300 mg total) by mouth at bedtime. Take 2 tablets at bedtime 60 tablet 1   No current facility-administered medications for this visit.      Psychiatric Specialty Exam: Review of Systems  There were no vitals taken for this visit.There is no height or weight on file to calculate BMI.  General Appearance: unable to assess due to phone visit  Eye Contact:  unable to assess due to phone visit  Speech:  Clear and Coherent and Normal Rate   Volume:  Normal  Mood:  Depressed  Affect:  Congruent  Thought Process:  Goal Directed and Descriptions of Associations: Intact  Orientation:  Full (Time, Place, and Person)  Thought Content: Logical   Suicidal Thoughts:  No  Homicidal Thoughts:  No  Memory:  Immediate;   Good Recent;   Good  Judgement:  Fair  Insight:  Fair  Psychomotor Activity:  Normal  Concentration:  Concentration: Good and Attention Span: Good  Recall:  Good  Fund of Knowledge: Good  Language: Good  Akathisia:  Negative  Handed:  Right  AIMS (if indicated):not done  Assets:   Communication Skills Desire for Improvement Financial Resources/Insurance Housing  ADL's:  Intact  Cognition: WNL  Sleep:  Fair   Screenings: PHQ2-9   Flowsheet Row Video Visit from 08/16/2020 in [facility] Office Visit from 12/15/2019 in Primary Care at [facility] Counselor from 09/07/2019 in [place]  PHQ-2 Total Score 4 2 6   PHQ-9 Total Score 10 5 10     Flowsheet Row ED to Hosp-Admission (Discharged) from 04/18/2020 in [facility]  C-SSRS RISK CATEGORY No Risk       Assessment and Plan: Patient is endorsing depression symptoms due to some ongoing stressors in her life.  She is agreeable to adjusting the dose of Prozac to see if that would help her symptoms better.  We will continue the remaining regimen the same way.  1. MDD (major depressive disorder), recurrent, in partial remission (HCC)  - FLUoxetine (PROZAC) 20 MG capsule; Take 3 capsules daily (60 mg)  Dispense: 90 capsule; Refill: 1 - gabapentin (NEURONTIN) 400 MG capsule; Take 1 capsule (400 mg total) by mouth at bedtime.  Dispense: 30 capsule; Refill: 2 - mirtazapine (REMERON) 30 MG tablet; Take 1 tablet (30 mg total) by mouth at bedtime.  Dispense: 30 tablet; Refill: 2 - QUEtiapine (SEROQUEL) 300 MG tablet; Take 1 tablet (300 mg total) by mouth at bedtime. Take 2 tablets at bedtime  Dispense: 60 tablet; Refill: 1   2. Anxiety disorder, unspecified type  - gabapentin (NEURONTIN) 400 MG capsule; Take 1 capsule (400 mg total) by mouth at bedtime.  Dispense: 30 capsule; Refill: 2  Prozac dose adjusted as above. Follow-up in 2 months   Zena Amos, MD 08/16/2020, 11:27 AM

## 2020-09-05 ENCOUNTER — Other Ambulatory Visit: Payer: Self-pay

## 2020-09-06 ENCOUNTER — Other Ambulatory Visit: Payer: Self-pay

## 2020-09-30 ENCOUNTER — Other Ambulatory Visit: Payer: Self-pay

## 2020-10-03 ENCOUNTER — Other Ambulatory Visit: Payer: Self-pay

## 2020-10-06 ENCOUNTER — Other Ambulatory Visit: Payer: Self-pay

## 2020-10-07 ENCOUNTER — Other Ambulatory Visit: Payer: Self-pay

## 2020-10-12 ENCOUNTER — Telehealth: Payer: Self-pay | Admitting: Internal Medicine

## 2020-10-12 ENCOUNTER — Other Ambulatory Visit: Payer: Self-pay

## 2020-10-12 DIAGNOSIS — J441 Chronic obstructive pulmonary disease with (acute) exacerbation: Secondary | ICD-10-CM

## 2020-10-12 MED ORDER — FLUTICASONE FUROATE-VILANTEROL 200-25 MCG/INH IN AEPB
1.0000 | INHALATION_SPRAY | Freq: Every day | RESPIRATORY_TRACT | 3 refills | Status: DC
  Filled 2020-10-12: qty 60, 60d supply, fill #0

## 2020-10-12 MED ORDER — ALBUTEROL SULFATE HFA 108 (90 BASE) MCG/ACT IN AERS
1.0000 | INHALATION_SPRAY | Freq: Four times a day (QID) | RESPIRATORY_TRACT | 0 refills | Status: DC | PRN
  Filled 2020-10-12 – 2020-10-20 (×2): qty 18, 25d supply, fill #0

## 2020-10-12 NOTE — Telephone Encounter (Signed)
   Nicole Cunningham DOB: 29-Mar-1960 MRN: [id number]   RIDER WAIVER AND RELEASE OF LIABILITY  For purposes of improving physical access to our facilities, [facility] is pleased to partner with third parties to provide [facility] patients or other authorized individuals the option of convenient, on-demand ground transportation services (the [employer]") through use of the technology service that enables users to request on-demand ground transportation from independent third-party providers.  By opting to use and accept these [employer], I, the undersigned, hereby agree on behalf of myself, and on behalf of any minor child using the Science writer for whom I am the parent or legal guardian, as follows:  Science writer provided to me are provided by independent third-party transportation providers who are not [employer] or employees and who are unaffiliated with [employer]. [facility] is neither a transportation carrier nor a common or public carrier. [facility] has no control over the quality or safety of the transportation that occurs as a result of the [employer]. [facility] cannot guarantee that any third-party transportation provider will complete any arranged transportation service. [facility] makes no representation, warranty, or guarantee regarding the reliability, timeliness, quality, safety, suitability, or availability of any of the Transport Services or that they will be error free. I fully understand that traveling by vehicle involves risks and dangers of serious bodily injury, including permanent disability, paralysis, and death. I agree, on behalf of myself and on behalf of any minor child using the Transport Services for whom I am the parent or legal guardian, that the entire risk arising out of my use of the [employer] remains solely with me, to the maximum extent permitted under applicable law. The [employer] are provided "as  is" and "as available." [facility] disclaims all representations and warranties, express, implied or statutory, not expressly set out in these terms, including the implied warranties of merchantability and fitness for a particular purpose. I hereby waive and release [facility], its agents, employees, officers, directors, representatives, insurers, attorneys, assigns, successors, subsidiaries, and affiliates from any and all past, present, or future claims, demands, liabilities, actions, causes of action, or suits of any kind directly or indirectly arising from acceptance and use of the [employer]. I further waive and release [facility] and its affiliates from all present and future liability and responsibility for any injury or death to persons or damages to property caused by or related to the use of the [employer]. I have read this Waiver and Release of Liability, and I understand the terms used in it and their legal significance. This Waiver is freely and voluntarily given with the understanding that my right (as well as the right of any minor child for whom I am the parent or legal guardian using the [employer]) to legal recourse against [facility] in connection with the [employer] is knowingly surrendered in return for use of these services.   I attest that I read the consent document to Nicole Cunningham, gave Ms. Montanaro the opportunity to ask questions and answered the questions asked (if any). I affirm that Nicole Cunningham then provided consent for she's participation in this program.     Farley Ly

## 2020-10-12 NOTE — Telephone Encounter (Signed)
Patient called in stating she was told by the pharmacy to call her doctor to get refills on her medications. Patient has an appointment on 10/20/2020. Patient needs refills on her medication  albuterol (VENTOLIN HFA) 108 (90 Base) MCG/ACT inhaler fluticasone furoate-vilanterol (BREO ELLIPTA) 200-25 MCG/INH AEPB   Pharmacy  [facility]  [address], [place] [address]  Phone:  [phone]  Fax:  [phone]  DEA #:  [id number]

## 2020-10-14 ENCOUNTER — Encounter (HOSPITAL_COMMUNITY): Payer: Self-pay | Admitting: Physician Assistant

## 2020-10-14 ENCOUNTER — Telehealth (INDEPENDENT_AMBULATORY_CARE_PROVIDER_SITE_OTHER): Payer: No Payment, Other | Admitting: Physician Assistant

## 2020-10-14 ENCOUNTER — Other Ambulatory Visit: Payer: Self-pay

## 2020-10-14 DIAGNOSIS — F419 Anxiety disorder, unspecified: Secondary | ICD-10-CM

## 2020-10-14 DIAGNOSIS — F3341 Major depressive disorder, recurrent, in partial remission: Secondary | ICD-10-CM | POA: Diagnosis not present

## 2020-10-14 MED ORDER — FLUOXETINE HCL 20 MG PO CAPS: ORAL_CAPSULE | ORAL | 2 refills | Status: DC

## 2020-10-14 MED ORDER — QUETIAPINE FUMARATE 300 MG PO TABS: 300.0000 mg | ORAL_TABLET | Freq: Every day | ORAL | 2 refills | Status: DC

## 2020-10-14 MED ORDER — MIRTAZAPINE 30 MG PO TABS
30.0000 mg | ORAL_TABLET | Freq: Every day | ORAL | 2 refills | Status: DC
Start: 2020-10-14 — End: 2020-12-16

## 2020-10-14 MED ORDER — GABAPENTIN 400 MG PO CAPS: 400.0000 mg | ORAL_CAPSULE | Freq: Every day | ORAL | 2 refills | Status: DC

## 2020-10-14 NOTE — Progress Notes (Signed)
BH MD/PA/NP OP Progress Note  Virtual Visit via Telephone Note  I connected with Nicole Cunningham on 10/14/20 at  1:30 PM EDT by telephone and verified that I am speaking with the correct person using two identifiers.  Location: Patient: Home Provider: Clinic   I discussed the limitations, risks, security and privacy concerns of performing an evaluation and management service by telephone and the availability of in person appointments. I also discussed with the patient that there may be a patient responsible charge related to this service. The patient expressed understanding and agreed to proceed.  Follow Up Instructions:  I discussed the assessment and treatment plan with the patient. The patient was provided an opportunity to ask questions and all were answered. The patient agreed with the plan and demonstrated an understanding of the instructions.   The patient was advised to call back or seek an in-person evaluation if the symptoms worsen or if the condition fails to improve as anticipated.  I provided 25 minutes of non-face-to-face time during this encounter.  Meta Hatchet, PA    10/14/2020 11:40 PM Nicole Cunningham  MRN:  [id number]  Chief Complaint: Follow up and medication management  HPI:   Nicole Cunningham is a 60 year old female with a past psychiatric history significant for major depressive disorder and anxiety disorder who presents to [facility] via virtual telephone visit for follow-up and medication management.  Patient is currently being managed on the following medications:  Fluoxetine 60 mg daily Gabapentin 400 mg at bedtime Mirtazapine 30 mg at bedtime Seroquel 300 mg at bedtime (patient to take two tablets at bedtime)  Patient states that her medication use has been going well.  Patient denies the need for dosage adjustments at this time and is requesting refills on all of her medications following the conclusion of  the encounter.  Patient endorses mild episodes of depression.  She endorses the following symptoms: fatigue, difficulty getting out of bed, irritability, and lack of motivation.  She denies sleep disturbances, changes in appetite, or feelings of guilt/worthlessness.  She also endorses anxiety she rates a 10 out of 10.  Triggers to her anxiety include living out of a motel, difficulty sleeping, and being confused at times.  Patient denies major life changing events.  Patient denies any other issues or concerns regarding her mental health.  Patient is alert and oriented x4, calm, cooperative, and fully engaged in conversation during the encounter.  Patient reports being a little depressed and in a bad mood.  She denies suicidal or homicidal ideations.  She further denies auditory or visual hallucinations and does not appear to be responding to internal/external stimuli.  Patient endorses sleeping.  Patient states that her appetite is improving some since being discharged from the hospital.  Patient denies alcohol consumption, tobacco use, and illicit drug use.  A PHQ-9 screen was performed with the patient scoring an 11.  A GAD-7 screen was also performed with the patient scoring a 15.  Visit Diagnosis:    ICD-10-CM   1. MDD (major depressive disorder), recurrent, in partial remission (HCC)  F33.41 FLUoxetine (PROZAC) 20 MG capsule    mirtazapine (REMERON) 30 MG tablet    QUEtiapine (SEROQUEL) 300 MG tablet    gabapentin (NEURONTIN) 400 MG capsule    2. Anxiety disorder, unspecified type  F41.9 gabapentin (NEURONTIN) 400 MG capsule      Past Psychiatric History:  Major depressive disorder Anxiety disorder  Past Medical History:  Past  Medical History:  Diagnosis Date   Bronchitis    COPD (chronic obstructive pulmonary disease) (HCC)    Hypertension    No past surgical history on file.  Family Psychiatric History:  Denied  Family History:  Family History  Problem Relation Age of Onset    Diabetes Mother    Diabetes Father     Social History:  Social History   Socioeconomic History   Marital status: Married    Spouse name: Not on file   Number of children: Not on file   Years of education: Not on file   Highest education level: Not on file  Occupational History   Not on file  Tobacco Use   Smoking status: Never   Smokeless tobacco: Never  Vaping Use   Vaping Use: Never used  Substance and Sexual Activity   Alcohol use: Not Currently   Drug use: Never   Sexual activity: Not Currently  Other Topics Concern   Not on file  Social History Narrative   Not on file   Social Determinants of Health   Financial Resource Strain: Not on file  Food Insecurity: Not on file  Transportation Needs: Not on file  Physical Activity: Not on file  Stress: Not on file  Social Connections: Not on file    Allergies: No Known Allergies  Metabolic Disorder Labs: Lab Results  Component Value Date   HGBA1C 5.3 04/18/2020   MPG 105.41 04/18/2020   No results found for: PROLACTIN No results found for: CHOL, TRIG, HDL, CHOLHDL, VLDL, LDLCALC Lab Results  Component Value Date   TSH 0.756 04/18/2020    Therapeutic Level Labs: No results found for: LITHIUM No results found for: VALPROATE No components found for:  CBMZ  Current Medications: Current Outpatient Medications  Medication Sig Dispense Refill   albuterol (VENTOLIN HFA) 108 (90 Base) MCG/ACT inhaler Inhale 1-2 puffs into the lungs every 6 (six) hours as needed for wheezing or shortness of breath. 18 g 0   amLODipine (NORVASC) 10 MG tablet TAKE 1 TABLET (10 MG TOTAL) BY MOUTH DAILY. 90 tablet 3   cephALEXin (KEFLEX) 500 MG capsule TAKE 1 CAPSULE BY MOUTH EVERY 12 HOURS FOR 6 DOSES. 6 capsule 0   FLUoxetine (PROZAC) 20 MG capsule Take 3 capsules daily (60 mg) 90 capsule 2   fluticasone furoate-vilanterol (BREO ELLIPTA) 200-25 MCG/INH AEPB Inhale 1 puff into the lungs daily. 60 each 3   gabapentin (NEURONTIN) 400  MG capsule Take 1 capsule (400 mg total) by mouth at bedtime. 30 capsule 2   losartan (COZAAR) 100 MG tablet TAKE 1 TABLET BY MOUTH DAILY 30 tablet 1   magnesium oxide (MAG-OX) 400 (241.3 Mg) MG tablet TAKE 1 TABLET BY MOUTH 2 TIMES DAILY 60 tablet 0   mirtazapine (REMERON) 30 MG tablet Take 1 tablet (30 mg total) by mouth at bedtime. 30 tablet 2   pantoprazole (PROTONIX) 40 MG tablet TAKE 1 TABLET BY MOUTH ONCE A DAY 30 tablet 0   QUEtiapine (SEROQUEL) 300 MG tablet Take 1 tablet (300 mg total) by mouth at bedtime. Take 2 tablets at bedtime 60 tablet 2   No current facility-administered medications for this visit.     Musculoskeletal: Strength & Muscle Tone: Unable to assess due to telemedicine visit Gait & Station: Unable to assess due to telemedicine visit Patient leans: Unable to assess due to telemedicine visit  Psychiatric Specialty Exam: Review of Systems  Psychiatric/Behavioral:  Negative for decreased concentration, dysphoric mood, hallucinations, self-injury, sleep disturbance  and suicidal ideas. The patient is nervous/anxious. The patient is not hyperactive.    There were no vitals taken for this visit.There is no height or weight on file to calculate BMI.  General Appearance: Unable to assess due to telemedicine visit  Eye Contact:  Unable to assess due to telemedicine visit  Speech:  Clear and Coherent and Normal Rate  Volume:  Normal  Mood:  Anxious and Depressed  Affect:  Congruent and Depressed  Thought Process:  Coherent, Goal Directed, and Descriptions of Associations: Intact  Orientation:  Full (Time, Place, and Person)  Thought Content: NA   Suicidal Thoughts:  No  Homicidal Thoughts:  No  Memory:  Immediate;   Good Recent;   Good Remote;   Good  Judgement:  Fair  Insight:  Fair  Psychomotor Activity:  Normal  Concentration:  Concentration: Good and Attention Span: Good  Recall:  Good  Fund of Knowledge: Fair  Language: Good  Akathisia:  Negative  Handed:   Right  AIMS (if indicated): not done  Assets:  Communication Skills Desire for Improvement Financial Resources/Insurance Housing  ADL's:  Intact  Cognition: WNL  Sleep:  Fair   Screenings: GAD-7    Flowsheet Row Video Visit from 10/14/2020 in [facility]  Total GAD-7 Score 15      PHQ2-9    Flowsheet Row Video Visit from 10/14/2020 in [facility] Video Visit from 08/16/2020 in [facility] Office Visit from 12/15/2019 in [facility] at [facility] Counselor from 09/07/2019 in [facility]  PHQ-2 Total Score 4 4 2 6   PHQ-9 Total Score 11 10 5 10       Flowsheet Row Video Visit from 10/14/2020 in [facility] ED to Hosp-Admission (Discharged) from 04/18/2020 in [facility]  C-SSRS RISK CATEGORY No Risk No Risk        Assessment and Plan:   04/20/2020. Piggott is a 60 year old female with a past psychiatric history significant for major depressive disorder and anxiety disorder who presents to [facility] via virtual telephone visit for follow-up and medication management.  Patient endorses mild episodes of depression as well as elevated anxiety due to certain triggers.  She reports no issues or concerns regarding her current medication regimen.  She denies the need for dosage adjustments at this time and is requesting refills on all her medications.  Patient's medications to be e-prescribed to pharmacy of choice.  1. MDD (major depressive disorder), recurrent, in partial remission (HCC)  - FLUoxetine (PROZAC) 20 MG capsule; Take 3 capsules daily (60 mg)  Dispense: 90 capsule; Refill: 2 - mirtazapine (REMERON) 30 MG tablet; Take 1 tablet (30 mg total) by mouth at bedtime.  Dispense: 30 tablet; Refill: 2 - QUEtiapine (SEROQUEL) 300 MG tablet; Take 1 tablet (300 mg  total) by mouth at bedtime. Take 2 tablets at bedtime  Dispense: 60 tablet; Refill: 2 - gabapentin (NEURONTIN) 400 MG capsule; Take 1 capsule (400 mg total) by mouth at bedtime.  Dispense: 30 capsule; Refill: 2  2. Anxiety disorder, unspecified type  - gabapentin (NEURONTIN) 400 MG capsule; Take 1 capsule (400 mg total) by mouth at bedtime.  Dispense: 30 capsule; Refill: 2  Patient to follow up in 2 months A total of 25 minutes was spent with the patient/reviewing patient's chart  68, PA 10/14/2020, 11:40 PM

## 2020-10-19 ENCOUNTER — Other Ambulatory Visit: Payer: Self-pay

## 2020-10-20 ENCOUNTER — Other Ambulatory Visit: Payer: Self-pay

## 2020-10-20 ENCOUNTER — Ambulatory Visit: Payer: Self-pay | Admitting: Family

## 2020-10-21 ENCOUNTER — Other Ambulatory Visit: Payer: Self-pay

## 2020-11-14 ENCOUNTER — Other Ambulatory Visit: Payer: Self-pay

## 2020-11-15 NOTE — Progress Notes (Signed)
Patient ID: Nicole Cunningham, female    DOB: [birth year]  MRN: [id number]  CC: Hypertension Follow-Up   Subjective: Nicole Cunningham is a 60 y.o. female who presents for hypertension follow-up.   Her concerns today include:   HYPERTENSION: Currently taking: see medication list Med Adherence: [x]  Yes   []  No Medication side effects: []  Yes    [x]  No Adherence with salt restriction (low-salt diet): [x]  Yes  []  No Exercise: Yes []  No [x]  Home Monitoring?: []  Yes    [x]  No Monitoring Frequency: []  Yes    [x]  No Home BP results range: []  Yes    [x]  No SOB? [x]  Yes,  has COPD  Chest Pain?: []  Yes    [x]  No Comments: Sometimes having headaches if blood pressures higher than normal. Taking BC powder and helps.   2. ACID REFLUX: Requesting refills on Omeprazole.    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 18 g 0   cephALEXin (KEFLEX) 500 MG capsule TAKE 1 CAPSULE BY MOUTH EVERY 12 HOURS FOR 6 DOSES. 6 capsule 0   FLUoxetine (PROZAC) 20 MG capsule Take 3 capsules daily (60 mg) 90 capsule 2   fluticasone furoate-vilanterol (BREO ELLIPTA) 200-25 MCG/INH AEPB Inhale 1 puff into the lungs  daily. 60 each 3   gabapentin (NEURONTIN) 400 MG capsule Take 1 capsule (400 mg total) by mouth at bedtime. 30 capsule 2   magnesium oxide (MAG-OX) 400 (241.3 Mg) MG tablet TAKE 1 TABLET BY MOUTH 2 TIMES DAILY 60 tablet 0   mirtazapine (REMERON) 30 MG tablet Take 1 tablet (30 mg total) by mouth at bedtime. 30 tablet 2   QUEtiapine (SEROQUEL) 300 MG tablet Take 1 tablet (300 mg total) by mouth at bedtime. Take 2 tablets at bedtime 60 tablet 2   [DISCONTINUED] Ipratropium-Albuterol (COMBIVENT RESPIMAT) 20-100 MCG/ACT AERS respimat Inhale 1 puff into the lungs every 6 (six) hours. (Patient not taking: No sig reported) 4 g 1   [DISCONTINUED] losartan-hydrochlorothiazide (HYZAAR) 100-25 MG tablet Take 1 tablet by mouth daily. 90 tablet 3   No current facility-administered medications on file prior to visit.    No Known Allergies  Social History   Socioeconomic History   Marital status: Married    Spouse name: Not on file   Number of children: Not on file   Years of education: Not on file   Highest education level: Not on file  Occupational History   Not on file  Tobacco Use   Smoking status: Never   Smokeless tobacco: Never  Vaping Use   Vaping Use: Never used  Substance and Sexual Activity   Alcohol use: Not Currently   Drug use: Never   Sexual  activity: Not Currently  Other Topics Concern   Not on file  Social History Narrative   Not on file   Social Determinants of Health   Financial Resource Strain: Not on file  Food Insecurity: Not on file  Transportation Needs: Not on file  Physical Activity: Not on file  Stress: Not on file  Social Connections: Not on file  Intimate Partner Violence: Not on file    Family History  Problem Relation Age of Onset   Diabetes Mother    Diabetes Father     History reviewed. No pertinent surgical history.  ROS: Review of Systems Negative except as stated above  PHYSICAL EXAM: BP 126/85   Pulse 98   Temp 98.4 F (36.9 C)    Resp 15   Ht 5' 4.02" (1.626 m)   Wt 90 lb 9.6 oz (41.1 kg)   SpO2 96%   BMI 15.54 kg/m   Physical Exam HENT:     Head: Normocephalic and atraumatic.  Eyes:     Extraocular Movements: Extraocular movements intact.     Conjunctiva/sclera: Conjunctivae normal.     Pupils: Pupils are equal, round, and reactive to light.  Cardiovascular:     Rate and Rhythm: Normal rate and regular rhythm.     Pulses: Normal pulses.     Heart sounds: Normal heart sounds.  Pulmonary:     Effort: Pulmonary effort is normal.     Breath sounds: Normal breath sounds.  Musculoskeletal:     Cervical back: Normal range of motion and neck supple.  Neurological:     General: No focal deficit present.     Mental Status: She is alert and oriented to person, place, and time.  Psychiatric:        Mood and Affect: Mood normal.        Behavior: Behavior normal.    ASSESSMENT AND PLAN: 1. Essential hypertension: - Continue Losartan and Amlodipine as prescribed.  - Counseled on blood pressure goal of less than 130/80, low-sodium, DASH diet, medication compliance, 150 minutes of moderate intensity exercise per week as tolerated. Discussed medication compliance, adverse effects. - BMP to evaluate kidney function and electrolyte balance. - Follow-up with primary provider in 3 months or sooner if needed. - losartan (COZAAR) 100 MG tablet; Take 1 tablet (100 mg total) by mouth daily.  Dispense: 90 tablet; Refill: 0 - amLODipine (NORVASC) 10 MG tablet; Take 1 tablet (10 mg total) by mouth daily.  Dispense: 90 tablet; Refill: 0 - Basic Metabolic Panel  2. Gastroesophageal reflux disease, unspecified whether esophagitis present: - Continue Pantoprazole as prescribed.  - Follow-up with primary provider as scheduled.  - pantoprazole (PROTONIX) 40 MG tablet; Take 1 tablet (40 mg total) by mouth daily.  Dispense: 90 tablet; Refill: 0    Patient was given the opportunity to ask questions.  Patient verbalized  understanding of the plan and was able to repeat key elements of the plan. Patient was given clear instructions to go to Emergency Department or return to medical center if symptoms don't improve, worsen, or new problems develop.The patient verbalized understanding.   Orders Placed This Encounter  Procedures   Basic Metabolic Panel    Requested Prescriptions   Signed Prescriptions Disp Refills   losartan (COZAAR) 100 MG tablet 90 tablet 0    Sig: Take 1 tablet (100 mg total) by mouth daily.   amLODipine (NORVASC) 10 MG tablet 90 tablet 0    Sig: Take 1 tablet (10 mg total) by mouth  daily.   pantoprazole (PROTONIX) 40 MG tablet 90 tablet 0    Sig: Take 1 tablet (40 mg total) by mouth daily.    Return in about 3 months (around 02/16/2021) for Follow-Up or next available hypertension .  Rema Fendt, NP

## 2020-11-16 ENCOUNTER — Other Ambulatory Visit: Payer: Self-pay

## 2020-11-16 ENCOUNTER — Ambulatory Visit (INDEPENDENT_AMBULATORY_CARE_PROVIDER_SITE_OTHER): Payer: Self-pay | Admitting: Family

## 2020-11-16 ENCOUNTER — Encounter: Payer: Self-pay | Admitting: Family

## 2020-11-16 VITALS — BP 126/85 | HR 98 | Temp 98.4°F | Resp 15 | Ht 64.02 in | Wt 90.6 lb

## 2020-11-16 DIAGNOSIS — I10 Essential (primary) hypertension: Secondary | ICD-10-CM

## 2020-11-16 DIAGNOSIS — J449 Chronic obstructive pulmonary disease, unspecified: Secondary | ICD-10-CM

## 2020-11-16 DIAGNOSIS — K219 Gastro-esophageal reflux disease without esophagitis: Secondary | ICD-10-CM

## 2020-11-16 MED ORDER — LOSARTAN POTASSIUM 100 MG PO TABS: 100.0000 mg | ORAL_TABLET | Freq: Every day | ORAL | 0 refills | Status: DC

## 2020-11-16 MED ORDER — AMLODIPINE BESYLATE 10 MG PO TABS: 10.0000 mg | ORAL_TABLET | Freq: Every day | ORAL | 0 refills | Status: DC

## 2020-11-16 MED ORDER — PANTOPRAZOLE SODIUM 40 MG PO TBEC: 40.0000 mg | DELAYED_RELEASE_TABLET | Freq: Every day | ORAL | 0 refills | Status: DC

## 2020-11-16 NOTE — Progress Notes (Signed)
Pt presents for hypertension follow-up pt reports bad headache for few days, needs refills on amlodipine, Protonix, losartan

## 2020-11-17 ENCOUNTER — Other Ambulatory Visit: Payer: Self-pay | Admitting: Family

## 2020-11-17 DIAGNOSIS — R944 Abnormal results of kidney function studies: Secondary | ICD-10-CM

## 2020-11-17 DIAGNOSIS — N1831 Chronic kidney disease, stage 3a: Secondary | ICD-10-CM

## 2020-11-17 DIAGNOSIS — N179 Acute kidney failure, unspecified: Secondary | ICD-10-CM

## 2020-11-17 LAB — BASIC METABOLIC PANEL
BUN/Creatinine Ratio: 7 — ABNORMAL LOW (ref 12–28)
BUN: 8 mg/dL (ref 8–27)
CO2: 21 mmol/L (ref 20–29)
Calcium: 11.2 mg/dL — ABNORMAL HIGH (ref 8.7–10.3)
Chloride: 108 mmol/L — ABNORMAL HIGH (ref 96–106)
Creatinine, Ser: 1.07 mg/dL — ABNORMAL HIGH (ref 0.57–1.00)
Glucose: 74 mg/dL (ref 65–99)
Potassium: 4.5 mmol/L (ref 3.5–5.2)
Sodium: 142 mmol/L (ref 134–144)
eGFR: 59 mL/min/{1.73_m2} — ABNORMAL LOW (ref >59)

## 2020-11-17 NOTE — Progress Notes (Signed)
Please call patient with update.   Kidney function remaining lower than normal. Plan to recheck kidney function in 4 to 6 weeks at lab only visit. Also, referral to Nephrology for further evaluation and management. Their office should call patient within 2 weeks with appointment details.

## 2020-12-07 ENCOUNTER — Other Ambulatory Visit: Payer: Self-pay

## 2020-12-09 ENCOUNTER — Telehealth: Payer: Self-pay | Admitting: Internal Medicine

## 2020-12-09 ENCOUNTER — Other Ambulatory Visit: Payer: Self-pay

## 2020-12-09 ENCOUNTER — Other Ambulatory Visit: Payer: Self-pay | Admitting: *Deleted

## 2020-12-09 DIAGNOSIS — I10 Essential (primary) hypertension: Secondary | ICD-10-CM

## 2020-12-09 DIAGNOSIS — K219 Gastro-esophageal reflux disease without esophagitis: Secondary | ICD-10-CM

## 2020-12-09 MED ORDER — AMLODIPINE BESYLATE 10 MG PO TABS
10.0000 mg | ORAL_TABLET | Freq: Every day | ORAL | 0 refills | Status: DC
  Filled 2020-12-09: qty 30, 30d supply, fill #0

## 2020-12-09 MED ORDER — PANTOPRAZOLE SODIUM 40 MG PO TBEC
40.0000 mg | DELAYED_RELEASE_TABLET | Freq: Every day | ORAL | 0 refills | Status: DC
  Filled 2020-12-09: qty 30, 30d supply, fill #0

## 2020-12-09 MED ORDER — LOSARTAN POTASSIUM 100 MG PO TABS
100.0000 mg | ORAL_TABLET | Freq: Every day | ORAL | 0 refills | Status: DC
  Filled 2020-12-09: qty 30, 30d supply, fill #0

## 2020-12-09 NOTE — Telephone Encounter (Signed)
Rx sent to [facility].  Called [facility] location to cancel refill.

## 2020-12-09 NOTE — Telephone Encounter (Signed)
Patient called the office stating she was seen on 11/16/2020 and asked for her medication refills to be sent to [facility]. Patient called stating the medication was not at the pharmacy. The medications were sent to [facility] on [address]. Patient is asking if the medication can be sent to [facility] today.    losartan (COZAAR) 100 MG tablet  amLODipine (NORVASC) 10 MG tablet pantoprazole (PROTONIX) 40 MG tablet

## 2020-12-13 ENCOUNTER — Other Ambulatory Visit: Payer: Self-pay

## 2020-12-16 ENCOUNTER — Telehealth (INDEPENDENT_AMBULATORY_CARE_PROVIDER_SITE_OTHER): Payer: No Payment, Other | Admitting: Physician Assistant

## 2020-12-16 ENCOUNTER — Other Ambulatory Visit: Payer: Self-pay

## 2020-12-16 DIAGNOSIS — F3341 Major depressive disorder, recurrent, in partial remission: Secondary | ICD-10-CM | POA: Diagnosis not present

## 2020-12-16 DIAGNOSIS — F419 Anxiety disorder, unspecified: Secondary | ICD-10-CM

## 2020-12-16 MED ORDER — QUETIAPINE FUMARATE 300 MG PO TABS: 300.0000 mg | ORAL_TABLET | Freq: Every day | ORAL | 2 refills | Status: DC

## 2020-12-16 MED ORDER — FLUOXETINE HCL 20 MG PO CAPS
ORAL_CAPSULE | ORAL | 2 refills | Status: DC
Start: 2020-12-16 — End: 2021-02-15

## 2020-12-16 MED ORDER — MIRTAZAPINE 30 MG PO TABS: 30.0000 mg | ORAL_TABLET | Freq: Every day | ORAL | 2 refills | Status: DC

## 2020-12-16 MED ORDER — GABAPENTIN 400 MG PO CAPS: 400.0000 mg | ORAL_CAPSULE | Freq: Every day | ORAL | 2 refills | Status: DC

## 2020-12-16 NOTE — Progress Notes (Signed)
BH MD/PA/NP OP Progress Note  Virtual Visit via Telephone Note  I connected with Nicole Cunningham on 12/23/20 at  2:30 PM EDT by telephone and verified that I am speaking with the correct person using two identifiers.  Location: Patient: Home Provider: Clinic   I discussed the limitations, risks, security and privacy concerns of performing an evaluation and management service by telephone and the availability of in person appointments. I also discussed with the patient that there may be a patient responsible charge related to this service. The patient expressed understanding and agreed to proceed.  Follow Up Instructions:  I discussed the assessment and treatment plan with the patient. The patient was provided an opportunity to ask questions and all were answered. The patient agreed with the plan and demonstrated an understanding of the instructions.   The patient was advised to call back or seek an in-person evaluation if the symptoms worsen or if the condition fails to improve as anticipated.  I provided 21 minutes of non-face-to-face time during this encounter.  Meta Hatchet, PA   12/23/2020 3:18 AM Nicole Cunningham  MRN:  [id number]  Chief Complaint: Follow up and medication management  HPI:   Nicole Epperly. Cunningham is a 60 year old female with a past psychiatric history significant for major depressive disorder and anxiety disorder who presents to [facility] via virtual telephone visit for follow-up and medication management.  Patient is currently being managed on the following medications:  Fluoxetine 60 mg daily Mirtazapine 30 mg at bedtime Seroquel two 300 mg tablets at bedtime Gabapentin 400 mg at bedtime  Patient reports that she has been recovering from an nasty cough.  Despite being under the weather, patient reports no issues or concerns regarding her current medication regimen.  Patient states that she has been taking her  medications as prescribed and is requesting refills on all her medications following the conclusion of the encounter.  Patient denies any depressive symptoms but states that she has been experiencing high levels of anxiety.  Patient rates her anxiety a 10 out of 10.  Patient denies any new stressors at this time and states that she is doing okay for now.  Patient denies experiencing any issues going to sleep.  A PHQ-9 screen was performed with the patient scoring a 7.  A GAD-7 screen was also performed with the patient scoring a 15.  Patient is alert and oriented x4, pleasant, calm, cooperative, and fully engaged in conversation during the encounter.  Patient endorses having a sore throat but is otherwise doing okay.  Patient denies suicidal or homicidal ideations.  She jokingly states that she is often irritated at everybody around her but denies wanting to hurt others.  She denies auditory or visual hallucinations and does not appear to be responding to internal/external stimuli.  Patient endorses good sleep and receives on average 8 hours of sleep each night.  Patient endorses fair appetite and eats on average 3 meals per day.  Patient denies alcohol consumption, tobacco use, and illicit drug use.   Visit Diagnosis:    ICD-10-CM   1. MDD (major depressive disorder), recurrent, in partial remission (HCC)  F33.41 FLUoxetine (PROZAC) 20 MG capsule    mirtazapine (REMERON) 30 MG tablet    QUEtiapine (SEROQUEL) 300 MG tablet    gabapentin (NEURONTIN) 400 MG capsule    2. Anxiety disorder, unspecified type  F41.9 gabapentin (NEURONTIN) 400 MG capsule      Past Psychiatric History:  Major  depressive disorder Anxiety disorder  Past Medical History:  Past Medical History:  Diagnosis Date   Bronchitis    COPD (chronic obstructive pulmonary disease) (HCC)    Hypertension    No past surgical history on file.  Family Psychiatric History:  Denied  Family History:  Family History  Problem  Relation Age of Onset   Diabetes Mother    Diabetes Father     Social History:  Social History   Socioeconomic History   Marital status: Married    Spouse name: Not on file   Number of children: Not on file   Years of education: Not on file   Highest education level: Not on file  Occupational History   Not on file  Tobacco Use   Smoking status: Never   Smokeless tobacco: Never  Vaping Use   Vaping Use: Never used  Substance and Sexual Activity   Alcohol use: Not Currently   Drug use: Never   Sexual activity: Not Currently  Other Topics Concern   Not on file  Social History Narrative   Not on file   Social Determinants of Health   Financial Resource Strain: Not on file  Food Insecurity: Not on file  Transportation Needs: Not on file  Physical Activity: Not on file  Stress: Not on file  Social Connections: Not on file    Allergies: No Known Allergies  Metabolic Disorder Labs: Lab Results  Component Value Date   HGBA1C 5.3 04/18/2020   MPG 105.41 04/18/2020   No results found for: PROLACTIN No results found for: CHOL, TRIG, HDL, CHOLHDL, VLDL, LDLCALC Lab Results  Component Value Date   TSH 0.756 04/18/2020    Therapeutic Level Labs: No results found for: LITHIUM No results found for: VALPROATE No components found for:  CBMZ  Current Medications: Current Outpatient Medications  Medication Sig Dispense Refill   albuterol (VENTOLIN HFA) 108 (90 Base) MCG/ACT inhaler Inhale 1-2 puffs into the lungs every 6 (six) hours as needed for wheezing or shortness of breath. 18 g 0   amLODipine (NORVASC) 10 MG tablet Take 1 tablet (10 mg total) by mouth daily. 90 tablet 0   cephALEXin (KEFLEX) 500 MG capsule TAKE 1 CAPSULE BY MOUTH EVERY 12 HOURS FOR 6 DOSES. 6 capsule 0   FLUoxetine (PROZAC) 20 MG capsule Take 3 capsules daily (60 mg) 90 capsule 2   fluticasone furoate-vilanterol (BREO ELLIPTA) 200-25 MCG/INH AEPB Inhale 1 puff into the lungs daily. 60 each 3    gabapentin (NEURONTIN) 400 MG capsule Take 1 capsule (400 mg total) by mouth at bedtime. 30 capsule 2   losartan (COZAAR) 100 MG tablet Take 1 tablet (100 mg total) by mouth daily. 90 tablet 0   magnesium oxide (MAG-OX) 400 (241.3 Mg) MG tablet TAKE 1 TABLET BY MOUTH 2 TIMES DAILY 60 tablet 0   mirtazapine (REMERON) 30 MG tablet Take 1 tablet (30 mg total) by mouth at bedtime. 30 tablet 2   pantoprazole (PROTONIX) 40 MG tablet Take 1 tablet (40 mg total) by mouth daily. 90 tablet 0   QUEtiapine (SEROQUEL) 300 MG tablet Take 1 tablet (300 mg total) by mouth at bedtime. Take 2 tablets at bedtime 60 tablet 2   No current facility-administered medications for this visit.     Musculoskeletal: Strength & Muscle Tone: Unable to assess due to telemedicine visit Gait & Station: Unable to assess due to telemedicine visit Patient leans: Unable to assess due to telemedicine visit  Psychiatric Specialty Exam: Review of  Systems  Psychiatric/Behavioral:  Negative for decreased concentration, dysphoric mood, hallucinations, self-injury, sleep disturbance and suicidal ideas. The patient is nervous/anxious. The patient is not hyperactive.    There were no vitals taken for this visit.There is no height or weight on file to calculate BMI.  General Appearance: Unable to assess due to telemedicine visit  Eye Contact:  Unable to assess due to telemedicine visit  Speech:  Clear and Coherent and Normal Rate  Volume:  Normal  Mood:  Anxious and Euthymic  Affect:  Appropriate and Congruent  Thought Process:  Coherent and Descriptions of Associations: Intact  Orientation:  Full (Time, Place, and Person)  Thought Content: WDL   Suicidal Thoughts:  No  Homicidal Thoughts:  No  Memory:  Immediate;   Good Recent;   Good Remote;   Good  Judgement:  Fair  Insight:  Fair  Psychomotor Activity:  Normal  Concentration:  Concentration: Good and Attention Span: Good  Recall:  Good  Fund of Knowledge: Fair   Language: Good  Akathisia:  Negative  Handed:  Right  AIMS (if indicated): not done  Assets:  Communication Skills Desire for Improvement Financial Resources/Insurance Housing  ADL's:  Intact  Cognition: WNL  Sleep:  Good   Screenings: GAD-7    Flowsheet Row Video Visit from 12/16/2020 in [facility] Video Visit from 10/14/2020 in [facility]  Total GAD-7 Score 15 15      PHQ2-9    Flowsheet Row Video Visit from 12/16/2020 in [facility] Office Visit from 11/16/2020 in Primary Care at [facility] Video Visit from 10/14/2020 in [facility] Video Visit from 08/16/2020 in [facility] Office Visit from 12/15/2019 in Primary Care at [facility]  PHQ-2 Total Score 2 6 4 4 2   PHQ-9 Total Score 7 11 11 10 5       Flowsheet Row Video Visit from 12/16/2020 in [facility] Video Visit from 10/14/2020 in [facility] ED to Hosp-Admission (Discharged) from 04/18/2020 in [facility]  C-SSRS RISK CATEGORY No Risk No Risk No Risk        Assessment and Plan:   04/20/2020. Beery is a 60 year old female with a past psychiatric history significant for major depressive disorder and anxiety disorder who presents to [facility] via virtual telephone visit for follow-up and medication management.  Patient denies experiencing any major depressive episodes but states that her anxiety has been elevated as of late.  Patient does express that she has recently been trying to overcome a sickness/nasty cough.  Patient denies a need for dosage adjustments at this time and is requesting refills on all her medications following the conclusion of the encounter.  Patient's medications to be e-prescribed to pharmacy of choice.  1. MDD  (major depressive disorder), recurrent, in partial remission (HCC)  - FLUoxetine (PROZAC) 20 MG capsule; Take 3 capsules daily (60 mg)  Dispense: 90 capsule; Refill: 2 - mirtazapine (REMERON) 30 MG tablet; Take 1 tablet (30 mg total) by mouth at bedtime.  Dispense: 30 tablet; Refill: 2 - QUEtiapine (SEROQUEL) 300 MG tablet; Take 1 tablet (300 mg total) by mouth at bedtime. Take 2 tablets at bedtime  Dispense: 60 tablet; Refill: 2 - gabapentin (NEURONTIN) 400 MG capsule; Take 1 capsule (400 mg total) by mouth at bedtime.  Dispense: 30 capsule; Refill: 2  2.  Anxiety disorder, unspecified type  - gabapentin (NEURONTIN) 400 MG capsule; Take 1 capsule (400 mg total) by mouth at bedtime.  Dispense: 30 capsule; Refill: 2  Patient to follow up in 2 months Provider spent a total of 21 minutes with the patient/reviewing patient's chart  Meta Hatchet, PA 12/23/2020, 3:18 AM

## 2020-12-21 ENCOUNTER — Other Ambulatory Visit (HOSPITAL_COMMUNITY): Payer: Self-pay

## 2020-12-23 ENCOUNTER — Encounter (HOSPITAL_COMMUNITY): Payer: Self-pay | Admitting: Physician Assistant

## 2021-01-05 ENCOUNTER — Emergency Department (HOSPITAL_COMMUNITY): Payer: Self-pay

## 2021-01-05 ENCOUNTER — Encounter (HOSPITAL_COMMUNITY): Payer: Self-pay

## 2021-01-05 ENCOUNTER — Other Ambulatory Visit: Payer: Self-pay

## 2021-01-05 ENCOUNTER — Emergency Department (HOSPITAL_COMMUNITY)
Admission: EM | Admit: 2021-01-05 | Discharge: 2021-01-05 | Disposition: A | Discharge status: Home / Self Care | Payer: Self-pay | Attending: Emergency Medicine | Admitting: Emergency Medicine

## 2021-01-05 DIAGNOSIS — E876 Hypokalemia: Secondary | ICD-10-CM

## 2021-01-05 DIAGNOSIS — J441 Chronic obstructive pulmonary disease with (acute) exacerbation: Secondary | ICD-10-CM

## 2021-01-05 DIAGNOSIS — I10 Essential (primary) hypertension: Secondary | ICD-10-CM | POA: Insufficient documentation

## 2021-01-05 DIAGNOSIS — R000 Tachycardia, unspecified: Secondary | ICD-10-CM | POA: Insufficient documentation

## 2021-01-05 DIAGNOSIS — Z20822 Contact with and (suspected) exposure to covid-19: Secondary | ICD-10-CM | POA: Insufficient documentation

## 2021-01-05 DIAGNOSIS — Z8616 Personal history of COVID-19: Secondary | ICD-10-CM | POA: Insufficient documentation

## 2021-01-05 DIAGNOSIS — Z79899 Other long term (current) drug therapy: Secondary | ICD-10-CM | POA: Insufficient documentation

## 2021-01-05 DIAGNOSIS — J3489 Other specified disorders of nose and nasal sinuses: Secondary | ICD-10-CM | POA: Insufficient documentation

## 2021-01-05 DIAGNOSIS — Z7951 Long term (current) use of inhaled steroids: Secondary | ICD-10-CM | POA: Insufficient documentation

## 2021-01-05 LAB — CBC WITH DIFFERENTIAL/PLATELET
Abs Immature Granulocytes: 0.01 10*3/uL (ref 0.00–0.07)
Basophils Absolute: 0 10*3/uL (ref 0.0–0.1)
Basophils Relative: 0 %
Eosinophils Absolute: 0.3 10*3/uL (ref 0.0–0.5)
Eosinophils Relative: 5 %
HCT: 23.8 % — ABNORMAL LOW (ref 36.0–46.0)
Hemoglobin: 7.6 g/dL — ABNORMAL LOW (ref 12.0–15.0)
Immature Granulocytes: 0 %
Lymphocytes Relative: 20 %
Lymphs Abs: 1.1 10*3/uL (ref 0.7–4.0)
MCH: 19.4 pg — ABNORMAL LOW (ref 26.0–34.0)
MCHC: 31.9 g/dL (ref 30.0–36.0)
MCV: 60.9 fL — ABNORMAL LOW (ref 80.0–100.0)
Monocytes Absolute: 0.4 10*3/uL (ref 0.1–1.0)
Monocytes Relative: 7 %
Neutro Abs: 3.6 10*3/uL (ref 1.7–7.7)
Neutrophils Relative %: 68 %
Platelets: 148 10*3/uL — ABNORMAL LOW (ref 150–400)
RBC: 3.91 MIL/uL (ref 3.87–5.11)
RDW: 21.3 % — ABNORMAL HIGH (ref 11.5–15.5)
WBC: 5.4 10*3/uL (ref 4.0–10.5)
nRBC: 0 % (ref 0.0–0.2)

## 2021-01-05 LAB — BASIC METABOLIC PANEL
Anion gap: 10 (ref 5–15)
BUN: 9 mg/dL (ref 8–23)
CO2: 21 mmol/L — ABNORMAL LOW (ref 22–32)
Calcium: 10.5 mg/dL — ABNORMAL HIGH (ref 8.9–10.3)
Chloride: 105 mmol/L (ref 98–111)
Creatinine, Ser: 1.02 mg/dL — ABNORMAL HIGH (ref 0.44–1.00)
GFR, Estimated: >60 mL/min (ref >60)
Glucose, Bld: 136 mg/dL — ABNORMAL HIGH (ref 70–99)
Potassium: 2.5 mmol/L — CL (ref 3.5–5.1)
Sodium: 136 mmol/L (ref 135–145)

## 2021-01-05 LAB — RESP PANEL BY RT-PCR (FLU A&B, COVID) ARPGX2
Influenza A by PCR: NEGATIVE
Influenza B by PCR: NEGATIVE
SARS Coronavirus 2 by RT PCR: NEGATIVE

## 2021-01-05 MED ORDER — PREDNISONE 20 MG PO TABS
60.0000 mg | ORAL_TABLET | Freq: Once | ORAL | Status: AC
  Administered 2021-01-05: 60 mg via ORAL
  Filled 2021-01-05: qty 3

## 2021-01-05 MED ORDER — ALBUTEROL SULFATE HFA 108 (90 BASE) MCG/ACT IN AERS
2.0000 | INHALATION_SPRAY | RESPIRATORY_TRACT | Status: DC | PRN
  Filled 2021-01-05 (×2): qty 6.7

## 2021-01-05 MED ORDER — POTASSIUM CHLORIDE 10 MEQ/100ML IV SOLN
10.0000 meq | INTRAVENOUS | Status: AC
  Administered 2021-01-05 (×2): 10 meq via INTRAVENOUS
  Filled 2021-01-05 (×2): qty 100

## 2021-01-05 MED ORDER — PREDNISONE 20 MG PO TABS
40.0000 mg | ORAL_TABLET | Freq: Every day | ORAL | 0 refills | Status: DC
  Filled 2021-01-05: qty 10, 5d supply, fill #0

## 2021-01-05 MED ORDER — POTASSIUM CHLORIDE CRYS ER 20 MEQ PO TBCR
40.0000 meq | EXTENDED_RELEASE_TABLET | Freq: Once | ORAL | Status: AC
  Administered 2021-01-05: 40 meq via ORAL
  Filled 2021-01-05: qty 2

## 2021-01-05 MED ORDER — SODIUM CHLORIDE 0.9 % IV SOLN: Freq: Once | INTRAVENOUS | Status: AC

## 2021-01-05 NOTE — ED Triage Notes (Signed)
Pt BIBA from home. Pt has hx of COPD, asthma. Pt has been SHOB x 1 week. Pt has increased congestion, worse with colder weather. Pt had wheezing on EMS arrival. 5 albuterol en route with EMS.   HR 130 130/70 94% RA ST

## 2021-01-05 NOTE — ED Provider Notes (Signed)
[facility]-EMERGENCY DEPT Provider Note   CSN: [id number] Arrival date & time: 01/05/21  1131     History Chief Complaint  Patient presents with   Shortness of Breath    Nicole Cunningham is a 60 y.o. female.  Patient is a 60 year old female with a history of COPD, depression, anemia, hypertension who presents today with complaints of shortness of breath.  Patient reported over the last few days she has had cough, nasal congestion and shortness of breath with wheezing.  It progressively worsened till today she was unable to catch her breath at all.  She had been using home inhalers but it was not working.  She has had a cough productive of sputum that is yellow.  She denies any fever.  She was having some tightness in her chest in the last few days when she was coughing.  She denies any swelling or pain in her legs.  No abdominal pain nausea or vomiting.  No known sick contacts.  She does report that every year around this time her breathing always seems to act up.  When EMS arrived patient was tachycardic tachypneic with oxygen saturation of 94% on room air.  Patient was given albuterol in route and currently patient reports she feels so much better and denies feeling short of breath at this time.  The history is provided by the patient and the EMS personnel.  Shortness of Breath     Past Medical History:  Diagnosis Date   Bronchitis    COPD (chronic obstructive pulmonary disease) (HCC)    Hypertension     Patient Active Problem List   Diagnosis Date Noted   MDD (major depressive disorder), recurrent, 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   Essential hypertension 04/25/2020   History of COVID-19 04/25/2020   History of sepsis 04/25/2020   Anemia 04/25/2020   Hypomagnesemia 04/25/2020   COVID-19 virus infection 04/20/2020   AKI (acute kidney injury) (HCC) 04/18/2020   Sepsis secondary to UTI (HCC) 04/18/2020   Hypertensive urgency 12/15/2019   COPD with  chronic bronchitis (HCC) 12/15/2019   MDD (major depressive disorder), recurrent, in full remission (HCC) 12/14/2019   Anxiety disorder 12/14/2019    History reviewed. No pertinent surgical history.   OB History   No obstetric history on file.     Family History  Problem Relation Age of Onset   Diabetes Mother    Diabetes Father     Social History   Tobacco Use   Smoking status: Never   Smokeless tobacco: Never  Vaping Use   Vaping Use: Never used  Substance Use Topics   Alcohol use: Not Currently   Drug use: Never    Home Medications Prior to Admission medications   Medication Sig Start Date End Date Taking? Authorizing Provider  albuterol (VENTOLIN HFA) 108 (90 Base) MCG/ACT inhaler Inhale 1-2 puffs into the lungs every 6 (six) hours as needed for wheezing or shortness of breath. 10/12/20   Rema Fendt, NP  amLODipine (NORVASC) 10 MG tablet Take 1 tablet (10 mg total) by mouth daily. 12/09/20 03/09/21  Rema Fendt, NP  cephALEXin (KEFLEX) 500 MG capsule TAKE 1 CAPSULE BY MOUTH EVERY 12 HOURS FOR 6 DOSES. 04/22/20 04/22/21  Pokhrel, Rebekah [place], MD  FLUoxetine (PROZAC) 20 MG capsule Take 3 capsules daily (60 mg) 12/16/20   Nwoko, Stephens Shire E, PA  fluticasone furoate-vilanterol (BREO ELLIPTA) 200-25 MCG/INH AEPB Inhale 1 puff into the lungs daily. 10/12/20   Rema Fendt, NP  gabapentin (NEURONTIN) 400 MG capsule Take 1 capsule (400 mg total) by mouth at bedtime. 12/16/20   Nwoko, Tommas Olp, PA  losartan (COZAAR) 100 MG tablet Take 1 tablet (100 mg total) by mouth daily. 12/09/20 03/09/21  Rema Fendt, NP  magnesium oxide (MAG-OX) 400 (241.3 Mg) MG tablet TAKE 1 TABLET BY MOUTH 2 TIMES DAILY 04/22/20 04/22/21  Pokhrel, Rebekah [place], MD  mirtazapine (REMERON) 30 MG tablet Take 1 tablet (30 mg total) by mouth at bedtime. 12/16/20   Nwoko, Tommas Olp, PA  pantoprazole (PROTONIX) 40 MG tablet Take 1 tablet (40 mg total) by mouth daily. 12/09/20 03/09/21  Rema Fendt, NP  QUEtiapine  (SEROQUEL) 300 MG tablet Take 1 tablet (300 mg total) by mouth at bedtime. Take 2 tablets at bedtime 12/16/20   Nwoko, Stephens Shire E, PA  Ipratropium-Albuterol (COMBIVENT RESPIMAT) 20-100 MCG/ACT AERS respimat Inhale 1 puff into the lungs every 6 (six) hours. Patient not taking: No sig reported 12/15/19 04/22/20  Storm Frisk, MD  losartan-hydrochlorothiazide (HYZAAR) 100-25 MG tablet Take 1 tablet by mouth daily. 12/15/19 04/22/20  Storm Frisk, MD    Allergies    Patient has no known allergies.  Review of Systems   Review of Systems  Respiratory:  Positive for shortness of breath.   All other systems reviewed and are negative.  Physical Exam Updated Vital Signs BP 121/81   Pulse (!) 117   Temp 98.2 F (36.8 C) (Oral)   Resp (!) 29   Ht 5\' 4"  (1.626 m)   Wt 40.8 kg   SpO2 99%   BMI 15.45 kg/m   Physical Exam Vitals and nursing note reviewed.  Constitutional:      General: She is not in acute distress.    Appearance: She is well-developed and underweight.  HENT:     Head: Normocephalic and atraumatic.     Nose: Congestion and rhinorrhea present.     Mouth/Throat:     Mouth: Mucous membranes are moist.  Eyes:     Pupils: Pupils are equal, round, and reactive to light.     Comments: Pale conjunctive a  Cardiovascular:     Rate and Rhythm: Regular rhythm. Tachycardia present.     Pulses: Normal pulses.     Heart sounds: Normal heart sounds. No murmur heard.   No friction rub.  Pulmonary:     Effort: Pulmonary effort is normal.     Breath sounds: Normal breath sounds. No wheezing or rales.  Abdominal:     General: Bowel sounds are normal. There is no distension.     Palpations: Abdomen is soft.     Tenderness: There is no abdominal tenderness. There is no guarding or rebound.  Musculoskeletal:        General: No tenderness. Normal range of motion.     Cervical back: Normal range of motion and neck supple.     Right lower leg: No edema.     Left lower leg: No  edema.     Comments: No edema  Skin:    General: Skin is warm and dry.     Coloration: Skin is pale.     Findings: No rash.  Neurological:     Mental Status: She is alert and oriented to person, place, and time.     Cranial Nerves: No cranial nerve deficit.  Psychiatric:        Mood and Affect: Mood normal.        Behavior: Behavior normal.  ED Results / Procedures / Treatments   Labs (all labs ordered are listed, but only abnormal results are displayed) Labs Reviewed  BASIC METABOLIC PANEL - Abnormal; Notable for the following components:      Result Value   Potassium 2.5 (*)    CO2 21 (*)    Glucose, Bld 136 (*)    Creatinine, Ser 1.02 (*)    Calcium 10.5 (*)    All other components within normal limits  CBC WITH DIFFERENTIAL/PLATELET - Abnormal; Notable for the following components:   Hemoglobin 7.6 (*)    HCT 23.8 (*)    MCV 60.9 (*)    MCH 19.4 (*)    RDW 21.3 (*)    Platelets 148 (*)    All other components within normal limits  RESP PANEL BY RT-PCR (FLU A&B, COVID) [id number]    EKG EKG Interpretation  Date/Time:  Thursday January 05 2021 11:43:37 EDT Ventricular Rate:  126 PR Interval:  165 QRS Duration: 74 QT Interval:  311 QTC Calculation: 451 R Axis:   81 Text Interpretation: Sinus tachycardia Borderline right axis deviation Anteroseptal infarct, old Minimal ST depression, diffuse leads No significant change since last tracing Confirmed by Gwyneth Sprout ([id number]) on 01/05/2021 12:41:59 PM  Radiology DG Chest 2 View  Result Date: 01/05/2021 CLINICAL DATA:  Shortness of breath. EXAM: CHEST - 2 VIEW COMPARISON:  October 09, 2019. FINDINGS: The heart size and mediastinal contours are within normal limits. Both lungs are clear. The visualized skeletal structures are unremarkable. IMPRESSION: No active cardiopulmonary disease. Electronically Signed   By: Lupita Raider M.D.   On: 01/05/2021 13:14    Procedures Procedures   Medications Ordered in  ED Medications  albuterol (VENTOLIN HFA) 108 (90 Base) MCG/ACT inhaler 2 puff (has no administration in time range)    ED Course  I have reviewed the triage vital signs and the nursing notes.  Pertinent labs & imaging results that were available during my care of the patient were reviewed by me and considered in my medical decision making (see chart for details).    MDM Rules/Calculators/A&P                           Patient presenting today with symptoms that sounded classic for COPD exacerbation with possible URI versus allergy symptoms.  Shortness of breath had been present for several days but became significantly worse today.  EMS reported diffuse wheezing, tachycardia and tachypnea.  She was given albuterol in route with significant improvement in her symptoms.  Here patient is satting 99% on room air with normal work of breathing and no significant wheezing currently.  She is tachycardic around 115.  She denies any fever.  No rhonchi noted on lung exam.  Patient is noted to be pale and does report a history of anemia.  She has not seen any bloody stools and denies any exertional chest pain concerning for cardiac etiology.  EKG shows sinus tachycardia but no other acute findings.  We will continue to monitor the patient.  Chest x-ray and labs are pending.  2:51 PM Tachycardia is improving.  Patient was found to have a potassium of 2.5 which may be related to significant albuterol use today.  She was given IV and oral potassium.  Chest x-ray without acute findings.  We will continues to have no shortness of breath and wheezing has remained resolved.  After runs of potassium patient will most likely be stable  for discharge.  MDM   Amount and/or Complexity of Data Reviewed Clinical lab tests: ordered and reviewed Tests in the radiology section of CPT: ordered and reviewed Independent visualization of images, tracings, or specimens: yes  CRITICAL CARE Performed by: Angeliah Wisdom Total critical care time: 30 minutes Critical care time was exclusive of separately billable procedures and treating other patients. Critical care was necessary to treat or prevent imminent or life-threatening deterioration. Critical care was time spent personally by me on the following activities: development of treatment plan with patient and/or surrogate as well as nursing, discussions with consultants, evaluation of patient's response to treatment, examination of patient, obtaining history from patient or surrogate, ordering and performing treatments and interventions, ordering and review of laboratory studies, ordering and review of radiographic studies, pulse oximetry and re-evaluation of patient's condition.    Final Clinical Impression(s) / ED Diagnoses Final diagnoses:  COPD exacerbation (HCC)  Hypokalemia    Rx / DC Orders ED Discharge Orders     None        Gwyneth Sprout, MD 01/05/21 1647

## 2021-01-05 NOTE — Discharge Instructions (Addendum)
The COVID test is negative.  The x-ray is normal and your labs are good except for your potassium is low.  Make sure you are eating foods with potassium and follow up with your doctor.  Return if your breathing gets worse.  Use your inhaler as needed and the next steroid you need is tomorrow.

## 2021-01-06 ENCOUNTER — Other Ambulatory Visit: Payer: Self-pay

## 2021-02-10 NOTE — Progress Notes (Signed)
Erroneous encounter

## 2021-02-13 ENCOUNTER — Encounter: Payer: Self-pay | Admitting: Family

## 2021-02-13 DIAGNOSIS — I10 Essential (primary) hypertension: Secondary | ICD-10-CM

## 2021-02-15 ENCOUNTER — Other Ambulatory Visit: Payer: Self-pay

## 2021-02-15 ENCOUNTER — Telehealth (INDEPENDENT_AMBULATORY_CARE_PROVIDER_SITE_OTHER): Payer: No Payment, Other | Admitting: Physician Assistant

## 2021-02-15 DIAGNOSIS — F3341 Major depressive disorder, recurrent, in partial remission: Secondary | ICD-10-CM

## 2021-02-15 DIAGNOSIS — F419 Anxiety disorder, unspecified: Secondary | ICD-10-CM

## 2021-02-15 MED ORDER — FLUOXETINE HCL 40 MG PO CAPS: 80.0000 mg | ORAL_CAPSULE | Freq: Every day | ORAL | 2 refills | Status: DC

## 2021-02-15 MED ORDER — GABAPENTIN 400 MG PO CAPS: 400.0000 mg | ORAL_CAPSULE | Freq: Every day | ORAL | 2 refills | Status: DC

## 2021-02-15 MED ORDER — QUETIAPINE FUMARATE 300 MG PO TABS: 300.0000 mg | ORAL_TABLET | Freq: Every day | ORAL | 2 refills | Status: DC

## 2021-02-15 MED ORDER — MIRTAZAPINE 30 MG PO TABS: 30.0000 mg | ORAL_TABLET | Freq: Every day | ORAL | 2 refills | Status: DC

## 2021-02-15 NOTE — Progress Notes (Signed)
MD/PA/NP OP Progress Note  Virtual Visit via Telephone Note  I connected with Nicole Cunningham on 02/15/21 at  2:30 PM EST by telephone and verified that I am speaking with the correct person using two identifiers.  Location: Patient: Home Provider: Clinic   I discussed the limitations, risks, security and privacy concerns of performing an evaluation and management service by telephone and the availability of in person appointments. I also discussed with the patient that there may be a patient responsible charge related to this service. The patient expressed understanding and agreed to proceed.  Follow Up Instructions:  I discussed the assessment and treatment plan with the patient. The patient was provided an opportunity to ask questions and all were answered. The patient agreed with the plan and demonstrated an understanding of the instructions.   The patient was advised to call back or seek an in-person evaluation if the symptoms worsen or if the condition fails to improve as anticipated.  I provided 18 minutes of non-face-to-face time during this encounter.  Meta Hatchet, PA   02/15/2021 2:57 PM NEKA BISE  MRN:  [id number]  Chief Complaint: Follow up and medication management  HPI:   Nicole Cunningham is a 60 year old female with a past psychiatric history significant for major depressive disorder and anxiety who presents to [facility] via virtual telephone visit for follow-up and medication management.  Patient is currently being managed on the following medications:  Fluoxetine 60 mg daily Mirtazapine 30 mg at bedtime Seroquel 300 mg (two tablets) at bedtime Gabapentin 400 mg at bedtime  Patient states that her medications are working well without issues.  She endorses some mild depression stating that multiple stressors in her life tend to bring her down.  Patient endorses the following depressive symptoms: feelings of  sadness and crying spells.  Patient denies decreased energy, irritability, feelings of guilt/worthlessness, and decreased concentration.  Patient further denies anxiety but states that she has an issue calming her nerves.  She reports that she has some anger directed towards where she lives.  Patient denies any new stressors at this time.  A PHQ-9 screen was performed with the patient scoring a 14.  A GAD-7 screen was also performed with the patient scoring a 12.  Patient is alert and oriented x4, calm, cooperative, and fully engaged in conversation during the encounter.  Patient endorses some depression.  Patient denies suicidal or homicidal ideations.  Patient denies active auditory or visual hallucinations but states that she sees things at night at times.  She reports that the last thing she is all visually with a few weeks ago.  Patient does not appear to be responding to internal/external stimuli.  Patient endorses good sleep and receives on average 10 hours of sleep each night.  Patient endorses good appetite stating that she eats every day.  Patient denies alcohol consumption, tobacco use, and illicit drug use.  Visit Diagnosis:    ICD-10-CM   1. MDD (major depressive disorder), recurrent, in partial remission (HCC)  F33.41 QUEtiapine (SEROQUEL) 300 MG tablet    gabapentin (NEURONTIN) 400 MG capsule    mirtazapine (REMERON) 30 MG tablet    FLUoxetine (PROZAC) 40 MG capsule    2. Anxiety disorder, unspecified type  F41.9 gabapentin (NEURONTIN) 400 MG capsule      Past Psychiatric History:  Major depressive disorder Anxiety disorder  Past Medical History:  Past Medical History:  Diagnosis Date   Bronchitis    COPD (  chronic obstructive pulmonary disease) (HCC)    Hypertension    No past surgical history on file.  Family Psychiatric History:  Denied  Family History:  Family History  Problem Relation Age of Onset   Diabetes Mother    Diabetes Father     Social History:   Social History   Socioeconomic History   Marital status: Married    Spouse name: Not on file   Number of children: Not on file   Years of education: Not on file   Highest education level: Not on file  Occupational History   Not on file  Tobacco Use   Smoking status: Never   Smokeless tobacco: Never  Vaping Use   Vaping Use: Never used  Substance and Sexual Activity   Alcohol use: Not Currently   Drug use: Never   Sexual activity: Not Currently  Other Topics Concern   Not on file  Social History Narrative   Not on file   Social Determinants of Health   Financial Resource Strain: Not on file  Food Insecurity: Not on file  Transportation Needs: Not on file  Physical Activity: Not on file  Stress: Not on file  Social Connections: Not on file    Allergies: No Known Allergies  Metabolic Disorder Labs: Lab Results  Component Value Date   HGBA1C 5.3 04/18/2020   MPG 105.41 04/18/2020   No results found for: PROLACTIN No results found for: CHOL, TRIG, HDL, CHOLHDL, VLDL, LDLCALC Lab Results  Component Value Date   TSH 0.756 04/18/2020    Therapeutic Level Labs: No results found for: LITHIUM No results found for: VALPROATE No components found for:  CBMZ  Current Medications: Current Outpatient Medications  Medication Sig Dispense Refill   albuterol (VENTOLIN HFA) 108 (90 Base) MCG/ACT inhaler Inhale 1-2 puffs into the lungs every 6 (six) hours as needed for wheezing or shortness of breath. 18 g 0   amLODipine (NORVASC) 10 MG tablet Take 1 tablet (10 mg total) by mouth daily. 90 tablet 0   cephALEXin (KEFLEX) 500 MG capsule TAKE 1 CAPSULE BY MOUTH EVERY 12 HOURS FOR 6 DOSES. 6 capsule 0   FLUoxetine (PROZAC) 40 MG capsule Take 2 capsules (80 mg total) by mouth daily. 60 capsule 2   fluticasone furoate-vilanterol (BREO ELLIPTA) 200-25 MCG/INH AEPB Inhale 1 puff into the lungs daily. 60 each 3   gabapentin (NEURONTIN) 400 MG capsule Take 1 capsule (400 mg total) by  mouth at bedtime. 30 capsule 2   losartan (COZAAR) 100 MG tablet Take 1 tablet (100 mg total) by mouth daily. 90 tablet 0   magnesium oxide (MAG-OX) 400 (241.3 Mg) MG tablet TAKE 1 TABLET BY MOUTH 2 TIMES DAILY 60 tablet 0   mirtazapine (REMERON) 30 MG tablet Take 1 tablet (30 mg total) by mouth at bedtime. 30 tablet 2   pantoprazole (PROTONIX) 40 MG tablet Take 1 tablet (40 mg total) by mouth daily. 90 tablet 0   predniSONE (DELTASONE) 20 MG tablet Take 2 tablets (40 mg total) by mouth daily. 10 tablet 0   QUEtiapine (SEROQUEL) 300 MG tablet Take 1 tablet (300 mg total) by mouth at bedtime. Take 2 tablets at bedtime 60 tablet 2   No current facility-administered medications for this visit.     Musculoskeletal: Strength & Muscle Tone: Unable to assess due to telemedicine visit Gait & Station: Unable to assess due to telemedicine visit Patient leans: Unable to assess due to telemedicine visit  Psychiatric Specialty Exam: Review of  Systems  Psychiatric/Behavioral:  Positive for agitation. Negative for decreased concentration, dysphoric mood, hallucinations, self-injury, sleep disturbance and suicidal ideas. The patient is nervous/anxious. The patient is not hyperactive.    There were no vitals taken for this visit.There is no height or weight on file to calculate BMI.  General Appearance: Unable to assess due to telemedicine visit  Eye Contact:  Unable to assess due to telemedicine visit  Speech:  Clear and Coherent and Normal Rate  Volume:  Normal  Mood:  Anxious and Depressed  Affect:  Congruent and Depressed  Thought Process:  Coherent, Goal Directed, and Descriptions of Associations: Intact  Orientation:  Full (Time, Place, and Person)  Thought Content: WDL and Hallucinations: Visual   Suicidal Thoughts:  No  Homicidal Thoughts:  No  Memory:  Immediate;   Good Recent;   Good Remote;   Good  Judgement:  Fair  Insight:  Fair  Psychomotor Activity:  Normal  Concentration:   Concentration: Good and Attention Span: Good  Recall:  Good  Fund of Knowledge: Fair  Language: Good  Akathisia:  Negative  Handed:  Right  AIMS (if indicated): not done  Assets:  Communication Skills Desire for Improvement Financial Resources/Insurance Housing  ADL's:  Intact  Cognition: WNL  Sleep:  Good   Screenings: GAD-7    Flowsheet Row Video Visit from 02/15/2021 in [facility] Video Visit from 12/16/2020 in [facility] Video Visit from 10/14/2020 in [facility]  Total GAD-7 Score 12 15 15       PHQ2-9    Flowsheet Row Video Visit from 02/15/2021 in [facility] Video Visit from 12/16/2020 in [facility] Office Visit from 11/16/2020 in [facility] at [facility] Video Visit from 10/14/2020 in [facility] Video Visit from 08/16/2020 in [facility]  PHQ-2 Total Score 3 2 6 4 4   PHQ-9 Total Score 14 7 11 11 10       Flowsheet Row Video Visit from 02/15/2021 in [facility] ED from 01/05/2021 in [facility] Video Visit from 12/16/2020 in [facility]  C-SSRS RISK CATEGORY No Risk No Risk No Risk        Assessment and Plan:   Nicole Cunningham is a 60 year old female with a past psychiatric history significant for major depressive disorder and anxiety who presents to [facility] via virtual telephone visit for follow-up and medication management.  Patient endorses some mild depressive symptoms but denies any issues with her current medication regimen. Patient does not appear to be in any acute distress and is agreeable to continuing to take her medications as prescribed. Patient's medications to be e-prescribed to pharmacy of choice.  1. MDD (major  depressive disorder), recurrent, in partial remission (HCC)  - QUEtiapine (SEROQUEL) 300 MG tablet; Take 1 tablet (300 mg total) by mouth at bedtime. Take 2 tablets at bedtime  Dispense: 60 tablet; Refill: 2 - gabapentin (NEURONTIN) 400 MG capsule; Take 1 capsule (400 mg total) by mouth at bedtime.  Dispense: 30 capsule; Refill: 2 - mirtazapine (REMERON) 30 MG tablet; Take 1 tablet (30 mg total) by mouth at bedtime.  Dispense: 30 tablet; Refill: 2 - FLUoxetine (PROZAC) 40 MG capsule; Take 2 capsules (80 mg total) by mouth daily.  Dispense: 60 capsule; Refill: 2  2. Anxiety disorder, unspecified type  - gabapentin (  NEURONTIN) 400 MG capsule; Take 1 capsule (400 mg total) by mouth at bedtime.  Dispense: 30 capsule; Refill: 2  Patient to follow up in 3 months Provider spent a total of 18 minutes with the patient/reviewing patient's chart  Meta Hatchet, PA 02/15/2021, 2:57 PM

## 2021-03-01 ENCOUNTER — Telehealth: Payer: Self-pay | Admitting: Family

## 2021-03-01 ENCOUNTER — Other Ambulatory Visit: Payer: Self-pay

## 2021-03-01 DIAGNOSIS — I10 Essential (primary) hypertension: Secondary | ICD-10-CM

## 2021-03-01 MED ORDER — LOSARTAN POTASSIUM 100 MG PO TABS
100.0000 mg | ORAL_TABLET | Freq: Every day | ORAL | 0 refills | Status: DC
  Filled 2021-03-01: qty 30, 30d supply, fill #0

## 2021-03-01 MED ORDER — AMLODIPINE BESYLATE 10 MG PO TABS
10.0000 mg | ORAL_TABLET | Freq: Every day | ORAL | 0 refills | Status: DC
  Filled 2021-03-01: qty 30, 30d supply, fill #0

## 2021-03-01 NOTE — Progress Notes (Deleted)
Patient ID: Nicole Cunningham, female    DOB: 01-29-1961  MRN: [id number]  CC: Hypertension Follow-Up  Subjective: Nicole Cunningham is a 60 y.o. female who presents for hypertension follow-up.   Her concerns today include:   HYPERTENSION FOLLOW-UP: 11/16/2020: Continue Losartan and Amlodipine as prescribed.   03/03/2021:  2. COPD FOLLOW-UP: Albuterol  Breo   Patient Active Problem List   Diagnosis Date Noted   MDD (major depressive disorder), recurrent, 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   Essential hypertension 04/25/2020   History of COVID-19 04/25/2020   History of sepsis 04/25/2020   Anemia 04/25/2020   Hypomagnesemia 04/25/2020   COVID-19 virus infection 04/20/2020   AKI (acute kidney injury) (HCC) 04/18/2020   Sepsis secondary to UTI (HCC) 04/18/2020   Hypertensive urgency 12/15/2019   COPD with chronic bronchitis (HCC) 12/15/2019   MDD (major depressive disorder), recurrent, in full remission (HCC) 12/14/2019   Anxiety disorder 12/14/2019     Current Outpatient Medications on File Prior to Visit  Medication Sig Dispense Refill   albuterol (VENTOLIN HFA) 108 (90 Base) MCG/ACT inhaler Inhale 1-2 puffs into the lungs every 6 (six) hours as needed for wheezing or shortness of breath. 18 g 0   amLODipine (NORVASC) 10 MG tablet Take 1 tablet (10 mg total) by mouth daily. 90 tablet 0   cephALEXin (KEFLEX) 500 MG capsule TAKE 1 CAPSULE BY MOUTH EVERY 12 HOURS FOR 6 DOSES. 6 capsule 0   FLUoxetine (PROZAC) 40 MG capsule Take 2 capsules (80 mg total) by mouth daily. 60 capsule 2   fluticasone furoate-vilanterol (BREO ELLIPTA) 200-25 MCG/INH AEPB Inhale 1 puff into the lungs daily. 60 each 3   gabapentin (NEURONTIN) 400 MG capsule Take 1 capsule (400 mg total) by mouth at bedtime. 30 capsule 2   losartan (COZAAR) 100 MG tablet Take 1 tablet (100 mg total) by mouth daily. 90 tablet 0   magnesium oxide (MAG-OX) 400 (241.3 Mg) MG tablet TAKE 1 TABLET BY MOUTH 2 TIMES DAILY 60  tablet 0   mirtazapine (REMERON) 30 MG tablet Take 1 tablet (30 mg total) by mouth at bedtime. 30 tablet 2   pantoprazole (PROTONIX) 40 MG tablet Take 1 tablet (40 mg total) by mouth daily. 90 tablet 0   predniSONE (DELTASONE) 20 MG tablet Take 2 tablets (40 mg total) by mouth daily. 10 tablet 0   QUEtiapine (SEROQUEL) 300 MG tablet Take 1 tablet (300 mg total) by mouth at bedtime. Take 2 tablets at bedtime 60 tablet 2   [DISCONTINUED] Ipratropium-Albuterol (COMBIVENT RESPIMAT) 20-100 MCG/ACT AERS respimat Inhale 1 puff into the lungs every 6 (six) hours. (Patient not taking: No sig reported) 4 g 1   [DISCONTINUED] losartan-hydrochlorothiazide (HYZAAR) 100-25 MG tablet Take 1 tablet by mouth daily. 90 tablet 3   No current facility-administered medications on file prior to visit.    No Known Allergies  Social History   Socioeconomic History   Marital status: Married    Spouse name: Not on file   Number of children: Not on file   Years of education: Not on file   Highest education level: Not on file  Occupational History   Not on file  Tobacco Use   Smoking status: Never   Smokeless tobacco: Never  Vaping Use   Vaping Use: Never used  Substance and Sexual Activity   Alcohol use: Not Currently   Drug use: Never   Sexual activity: Not Currently  Other Topics Concern   Not on file  Social History Narrative   Not on file   Social Determinants of Health   Financial Resource Strain: Not on file  Food Insecurity: Not on file  Transportation Needs: Not on file  Physical Activity: Not on file  Stress: Not on file  Social Connections: Not on file  Intimate Partner Violence: Not on file    Family History  Problem Relation Age of Onset   Diabetes Mother    Diabetes Father     No past surgical history on file.  ROS: Review of Systems Negative except as stated above  PHYSICAL EXAM: There were no vitals taken for this visit.  Physical Exam  {female adult  master:310786} {female adult master:310785}  CMP Latest Ref Rng & Units 01/05/2021 11/16/2020 04/22/2020  Glucose 70 - 99 mg/dL 154(M) 74 086(P)  BUN 8 - 23 mg/dL 9 8 10   Creatinine 0.44 - 1.00 mg/dL ) 6.19(J) 0.93(O)  Sodium 135 - 145 mmol/L 136 142 140  Potassium 3.5 - 5.1 mmol/L 2.5(LL) 4.5 3.6  Chloride 98 - 111 mmol/L 105 108(H) 108  CO2 22 - 32 mmol/L 21(L) 21 20(L)  Calcium 8.9 - 10.3 mg/dL 10.5(H) 11.2(H) 7.9(L)  Total Protein 6.5 - 8.1 g/dL - - 6.7  Total Bilirubin 0.3 - 1.2 mg/dL - - 0.8  Alkaline Phos 38 - 126 U/L - - 71  AST 15 - 41 U/L - - 18  ALT 0 - 44 U/L - - 14   Lipid Panel  No results found for: CHOL, TRIG, HDL, CHOLHDL, VLDL, LDLCALC, LDLDIRECT  CBC    Component Value Date/Time   WBC 5.4 01/05/2021 1238   RBC 3.91 01/05/2021 1238   HGB 7.6 (L) 01/05/2021 1238   HCT 23.8 (L) 01/05/2021 1238   PLT 148 (L) 01/05/2021 1238   MCV 60.9 (L) 01/05/2021 1238   MCH 19.4 (L) 01/05/2021 1238   MCHC 31.9 01/05/2021 1238   RDW 21.3 (H) 01/05/2021 1238   LYMPHSABS 1.1 01/05/2021 1238   MONOABS 0.4 01/05/2021 1238   EOSABS 0.3 01/05/2021 1238   BASOSABS 0.0 01/05/2021 1238    ASSESSMENT AND PLAN:  There are no diagnoses linked to this encounter.   Patient was given the opportunity to ask questions.  Patient verbalized understanding of the plan and was able to repeat key elements of the plan. Patient was given clear instructions to go to Emergency Department or return to medical center if symptoms don't improve, worsen, or new problems develop.The patient verbalized understanding.   No orders of the defined types were placed in this encounter.    Requested Prescriptions    No prescriptions requested or ordered in this encounter    No follow-ups on file.  01/07/2021, NP

## 2021-03-01 NOTE — Telephone Encounter (Signed)
Pt requesting refills for Blood pressure and inhaler. Pt stated she did not have the name of the medications but it should be on file.   Pharmacy of choice [facility] pharmacy.   Thank you

## 2021-03-02 ENCOUNTER — Other Ambulatory Visit: Payer: Self-pay

## 2021-03-03 ENCOUNTER — Ambulatory Visit: Payer: Self-pay | Admitting: Family

## 2021-03-10 ENCOUNTER — Ambulatory Visit: Payer: Self-pay | Admitting: Family Medicine

## 2021-03-14 ENCOUNTER — Emergency Department (HOSPITAL_COMMUNITY): Payer: Self-pay

## 2021-03-14 ENCOUNTER — Other Ambulatory Visit: Payer: Self-pay

## 2021-03-14 ENCOUNTER — Emergency Department (HOSPITAL_COMMUNITY)
Admission: EM | Admit: 2021-03-14 | Discharge: 2021-03-15 | Disposition: A | Discharge status: Home / Self Care | Payer: Self-pay | Attending: Emergency Medicine | Admitting: Emergency Medicine

## 2021-03-14 ENCOUNTER — Encounter (HOSPITAL_COMMUNITY): Payer: Self-pay | Admitting: Emergency Medicine

## 2021-03-14 DIAGNOSIS — Z79899 Other long term (current) drug therapy: Secondary | ICD-10-CM | POA: Insufficient documentation

## 2021-03-14 DIAGNOSIS — R0781 Pleurodynia: Secondary | ICD-10-CM | POA: Insufficient documentation

## 2021-03-14 DIAGNOSIS — M545 Low back pain, unspecified: Secondary | ICD-10-CM | POA: Insufficient documentation

## 2021-03-14 DIAGNOSIS — Z8616 Personal history of COVID-19: Secondary | ICD-10-CM | POA: Insufficient documentation

## 2021-03-14 DIAGNOSIS — Z7951 Long term (current) use of inhaled steroids: Secondary | ICD-10-CM | POA: Insufficient documentation

## 2021-03-14 DIAGNOSIS — I10 Essential (primary) hypertension: Secondary | ICD-10-CM | POA: Insufficient documentation

## 2021-03-14 DIAGNOSIS — J449 Chronic obstructive pulmonary disease, unspecified: Secondary | ICD-10-CM | POA: Insufficient documentation

## 2021-03-14 DIAGNOSIS — R42 Dizziness and giddiness: Secondary | ICD-10-CM | POA: Insufficient documentation

## 2021-03-14 DIAGNOSIS — W19XXXA Unspecified fall, initial encounter: Secondary | ICD-10-CM

## 2021-03-14 DIAGNOSIS — R109 Unspecified abdominal pain: Secondary | ICD-10-CM | POA: Insufficient documentation

## 2021-03-14 MED ORDER — ONDANSETRON HCL 4 MG/2ML IJ SOLN
4.0000 mg | Freq: Once | INTRAMUSCULAR | Status: DC
  Filled 2021-03-14: qty 2

## 2021-03-14 MED ORDER — ONDANSETRON HCL 4 MG/2ML IJ SOLN: 4.0000 mg | Freq: Once | INTRAMUSCULAR | Status: DC

## 2021-03-14 MED ORDER — LIDOCAINE 5 % EX PTCH: 1.0000 | MEDICATED_PATCH | CUTANEOUS | Status: DC

## 2021-03-14 MED ORDER — SODIUM CHLORIDE 0.9 % IV BOLUS: 500.0000 mL | Freq: Once | INTRAVENOUS | Status: DC

## 2021-03-14 MED ORDER — HYDROCODONE-ACETAMINOPHEN 5-325 MG PO TABS: 1.0000 | ORAL_TABLET | ORAL | 0 refills | Status: DC | PRN

## 2021-03-14 MED ORDER — MORPHINE SULFATE (PF) 4 MG/ML IV SOLN
4.0000 mg | Freq: Once | INTRAVENOUS | Status: AC
  Administered 2021-03-14: 21:00:00 4 mg via INTRAMUSCULAR

## 2021-03-14 MED ORDER — LIDOCAINE 5 % EX PTCH
1.0000 | MEDICATED_PATCH | CUTANEOUS | 0 refills | Status: DC
  Filled 2021-03-15: qty 30, 30d supply, fill #0

## 2021-03-14 MED ORDER — NAPROXEN 500 MG PO TABS
500.0000 mg | ORAL_TABLET | Freq: Two times a day (BID) | ORAL | 0 refills | Status: DC
  Filled 2021-03-15: qty 30, 15d supply, fill #0

## 2021-03-14 MED ORDER — MORPHINE SULFATE (PF) 4 MG/ML IV SOLN
4.0000 mg | Freq: Once | INTRAVENOUS | Status: DC
  Filled 2021-03-14: qty 1

## 2021-03-14 NOTE — ED Notes (Signed)
Patients husband would like a call back with an update: Peyton Najjar [phone]

## 2021-03-14 NOTE — ED Provider Notes (Signed)
Emergency Medicine Provider Triage Evaluation Note  Nicole Cunningham , a 60 y.o. female  was evaluated in triage.  Pt complains of back injury.  Patient slipped getting out of the bathtub yesterday and fell and hit her lower back against the toilet,.  She took Motrin and ice without relief.  She rates her pain at 10 out of 10.  She states she is unable to walk due to the pain.  She denies weakness numbness saddle anesthesia loss of bowel or bladder continence..  Review of Systems  Positive: Back pain Negative: Fever ,weakness  Physical Exam  BP 98/67 (BP Location: Left Arm)    Pulse 87    Temp 97.8 F (36.6 C) (Oral)    Resp 18    Ht 5\' 4"  (1.626 m)    Wt 40.8 kg    SpO2 100%    BMI 15.45 kg/m  Gen:   Awake, no distress   Resp:  Normal effort  MSK:   Moves extremities without difficulty  Other:  No midline spinal tenderness, tender to palpation on the right lumbar paraspinals  Medical Decision Making  Medically screening exam initiated at 1:58 PM.  Appropriate orders placed.  Nicole Cunningham was informed that the remainder of the evaluation will be completed by another provider, this initial triage assessment does not replace that evaluation, and the importance of remaining in the ED until their evaluation is complete.  Imaging ordered   Arlyn Leak, PA-C 03/14/21 1359    03/16/21, MD 03/15/21 [phone]

## 2021-03-14 NOTE — ED Notes (Signed)
Patient has been stuck for IV access by this RN 3x. IV Team RN stuck patient 2x unsuccessfully. Patient does not want to be stuck again

## 2021-03-14 NOTE — ED Notes (Signed)
Patient transported to CT 

## 2021-03-14 NOTE — ED Notes (Signed)
Patient able to ambulate to bathroom with no issues. Patient stated she felt dizzy when sitting up but not when walking. RN suggested to patient that flip flops may not be the best idea for footwear at this time.

## 2021-03-14 NOTE — Progress Notes (Signed)
Attempted IV start x1 but no success,refused another start, RN made aware.

## 2021-03-14 NOTE — Discharge Instructions (Signed)
Your imaging today did not show any significant abnormality.  I have written you for a few medications to help with your symptoms.  Please take as prescribed.  Follow-up with primary care provider for reevaluation  Return for new or worsening symptoms.

## 2021-03-14 NOTE — ED Provider Notes (Signed)
[facility] DEPT Provider Note   CSN: [id number] Arrival date & time: 03/14/21  1339    History Chief Complaint  Patient presents with   Lytle Michaels    Nicole Cunningham is a 60 y.o. female with past medical history significant for hypertension who presents for evaluation of fall.  Patient states she was getting off the commode yesterday, felt like she stood up too quickly and felt lightheaded causing her to fall.  She denies actual syncope.  She denies hitting her head, LOC.  States she hit her right posterior ribs, back and abdomen.  She has had pain to these areas since.  Has not been able to walk due to the pain.  She was however able to get her self up off the floor.  She has not taken anything for pain.  She denies any bowel or bladder incontinence, saddle paresthesia, numbness or pain to her extremities.  She denies any sudden onset headache, numbness, weakness, chest pain, shortness of breath, back pain or abdominal pain leading to her lightheadedness.  Denies additional aggravating or alleviating factors.  She rates her current pain an 8/10.  Lives with her husband as well as some  History obtained from patient and past medical records.  No interpreter is used.  HPI     Past Medical History:  Diagnosis Date   Bronchitis    COPD (chronic obstructive pulmonary disease) ([place])    Hypertension     Patient Active Problem List   Diagnosis Date Noted   MDD (major depressive disorder), recurrent, in partial remission ([place]) 10/14/2020   Essential hypertension 04/25/2020   History of COVID-19 04/25/2020   History of sepsis 04/25/2020   Anemia 04/25/2020   Hypomagnesemia 04/25/2020   COVID-19 virus infection 04/20/2020   AKI (acute kidney injury) ([place]) 04/18/2020   Sepsis secondary to UTI ([place]) 04/18/2020   Hypertensive urgency 12/15/2019   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 No pertinent surgical history.   OB History   No obstetric history on file.     Family History  Problem Relation Age of Onset   Diabetes Mother    Diabetes Father     Social History   Tobacco Use   Smoking status: Never   Smokeless tobacco: Never  Vaping Use   Vaping Use: Never used  Substance Use Topics   Alcohol use: Not Currently   Drug use: Never    Home Medications Prior to Admission medications   Medication Sig Start Date End Date Taking? Authorizing Provider  HYDROcodone-acetaminophen (NORCO/VICODIN) 5-325 MG tablet Take 1 tablet by mouth every 4 (four) hours as needed. 03/14/21  Yes Zori Benbrook A, PA-C  lidocaine (LIDODERM) 5 % Place 1 patch onto the skin daily. Remove & Discard patch within 12 hours or as directed by MD 03/14/21  Yes Analysse Quinonez A, PA-C  naproxen (NAPROSYN) 500 MG tablet Take 1 tablet (500 mg total) by mouth 2 (two) times daily. 03/14/21  Yes Salisa Broz A, PA-C  albuterol (VENTOLIN HFA) 108 (90 Base) MCG/ACT inhaler Inhale 1-2 puffs into the lungs every 6 (six) hours as needed for wheezing or shortness of breath. 10/12/20   Camillia Herter, NP  amLODipine (NORVASC) 10 MG tablet Take 1 tablet (10 mg total) by mouth daily. 03/01/21 05/30/21  [name], MD  cephALEXin (KEFLEX) 500 MG capsule TAKE 1 CAPSULE  BY MOUTH EVERY 12 HOURS FOR 6 DOSES. 04/22/20 04/22/21  Pokhrel, Corrie Mckusick, MD  FLUoxetine (PROZAC) 40 MG capsule Take 2 capsules (80 mg total) by mouth daily. 02/15/21   Nwoko, Isidoro Donning E, PA  fluticasone furoate-vilanterol (BREO ELLIPTA) 200-25 MCG/INH AEPB Inhale 1 puff into the lungs daily. 10/12/20   Camillia Herter, NP  gabapentin (NEURONTIN) 400 MG capsule Take 1 capsule (400 mg total) by mouth at bedtime. 02/15/21   Nwoko, Terese Door, PA  losartan (COZAAR) 100 MG tablet Take 1 tablet (100 mg total) by mouth daily. 03/01/21 05/30/21  [name], MD  magnesium oxide  (MAG-OX) 400 (241.3 Mg) MG tablet TAKE 1 TABLET BY MOUTH 2 TIMES DAILY 04/22/20 04/22/21  Pokhrel, Corrie Mckusick, MD  mirtazapine (REMERON) 30 MG tablet Take 1 tablet (30 mg total) by mouth at bedtime. 02/15/21   Nwoko, Terese Door, PA  pantoprazole (PROTONIX) 40 MG tablet Take 1 tablet (40 mg total) by mouth daily. 12/09/20 03/09/21  Camillia Herter, NP  predniSONE (DELTASONE) 20 MG tablet Take 2 tablets (40 mg total) by mouth daily. 01/05/21   Blanchie Dessert, MD  QUEtiapine (SEROQUEL) 300 MG tablet Take 1 tablet (300 mg total) by mouth at bedtime. Take 2 tablets at bedtime 02/15/21   Nwoko, Isidoro Donning E, PA  Ipratropium-Albuterol (COMBIVENT RESPIMAT) 20-100 MCG/ACT AERS respimat Inhale 1 puff into the lungs every 6 (six) hours. Patient not taking: No sig reported 12/15/19 04/22/20  Elsie Stain, MD  losartan-hydrochlorothiazide (HYZAAR) 100-25 MG tablet Take 1 tablet by mouth daily. 12/15/19 04/22/20  Elsie Stain, MD    Allergies    Patient has no known allergies.  Review of Systems   Review of Systems  Constitutional: Negative.   HENT: Negative.    Respiratory: Negative.    Cardiovascular:  Positive for chest pain (chets wall pain, posterior).  Gastrointestinal:  Positive for abdominal pain (right flank pain). Negative for abdominal distention, blood in stool, constipation, diarrhea, nausea and vomiting.  Genitourinary: Negative.   Musculoskeletal:  Positive for back pain. Negative for arthralgias, gait problem, joint swelling, myalgias, neck pain and neck stiffness.  Skin: Negative.   Neurological:  Positive for light-headedness (resolved). Negative for dizziness, tremors, seizures, syncope, facial asymmetry, speech difficulty, weakness, numbness and headaches.  All other systems reviewed and are negative.  Physical Exam Updated Vital Signs BP 111/66 (BP Location: Left Arm)    Pulse 68    Temp 97.8 F (36.6 C) (Oral)    Resp 18    Ht 5\' 4"  (1.626 m)    Wt 40.8 kg    SpO2 100%    BMI 15.45  kg/m   Physical Exam Vitals and nursing note reviewed.  Constitutional:      General: She is not in acute distress.    Appearance: She is well-developed. She is not ill-appearing, toxic-appearing or diaphoretic.  HENT:     Head: Normocephalic and atraumatic.     Nose: Nose normal.     Mouth/Throat:     Mouth: Mucous membranes are moist.  Eyes:     Extraocular Movements: Extraocular movements intact.     Conjunctiva/sclera: Conjunctivae normal.     Pupils: Pupils are equal, round, and reactive to light.     Comments: No nystagmus  Neck:     Comments: No midline tenderness Cardiovascular:     Rate and Rhythm: Normal rate.     Pulses: Normal pulses.          Radial pulses are 2+ on the  right side and 2+ on the left side.       Dorsalis pedis pulses are 2+ on the right side and 2+ on the left side.       Posterior tibial pulses are 2+ on the right side and 2+ on the left side.     Heart sounds: Normal heart sounds.  Pulmonary:     Effort: Pulmonary effort is normal. No respiratory distress.     Breath sounds: Normal breath sounds.     Comments: CLear bl, speaks in full sentences without difficulty Abdominal:     General: Bowel sounds are normal. There is no distension.     Palpations: Abdomen is soft. There is no mass.     Tenderness: There is abdominal tenderness. There is right CVA tenderness and guarding. There is no left CVA tenderness or rebound.     Hernia: No hernia is present.     Comments: Tenderness to right posterior lower ribs, right flank, lateral right abdomen  Musculoskeletal:        General: Normal range of motion.     Cervical back: Normal range of motion.     Comments: Diffuse midline tenderness to thoracic, lumbar paraspinal muscles. No bony tenderness to Bl Upper and lower extremities   Skin:    General: Skin is warm and dry.     Capillary Refill: Capillary refill takes less than 2 seconds.     Comments: No contusions, abrasions, ecchymosis   Neurological:      General: No focal deficit present.     Mental Status: She is alert.     Comments: Cn 2-12 grossly intact Equal grip Intact sensation Neg drift  Psychiatric:        Mood and Affect: Mood normal.    ED Results / Procedures / Treatments   Labs (all labs ordered are listed, but only abnormal results are displayed) Labs Reviewed - No data to display   EKG EKG Interpretation  Date/Time:  Tuesday March 14 2021 18:42:33 EST Ventricular Rate:  81 PR Interval:  196 QRS Duration: 78 QT Interval:  375 QTC Calculation: 436 R Axis:   75 Text Interpretation: Sinus rhythm Normal ECG Since last tracing Rate slower Confirmed by Calvert Cantor [phone]) on 03/14/2021 7:29:15 PM  Radiology DG Ribs Unilateral W/Chest Right  Result Date: 03/14/2021 CLINICAL DATA:  Recent slip and fall with back pain on the right, initial encounter EXAM: RIGHT RIBS AND CHEST - 3+ VIEW COMPARISON:  01/05/2021 FINDINGS: Cardiac shadow is within normal limits. The lungs are well aerated bilaterally. Biapical pleural and parenchymal scarring is seen. No pneumothorax is noted. No definitive rib fracture is noted. IMPRESSION: No definitive rib fracture is noted. Electronically Signed   By: Inez Catalina M.D.   On: 03/14/2021 19:54   DG Lumbar Spine Complete  Result Date: 03/14/2021 CLINICAL DATA:  Lumbar pain, recent fall EXAM: LUMBAR SPINE - COMPLETE 4+ VIEW COMPARISON:  None. FINDINGS: Vertebral body heights are maintained. No acute fracture. Lower lumbar disc space narrowing and small endplate osteophytes. Stool is present throughout the colon. IMPRESSION: No acute fracture. Electronically Signed   By: Macy Mis M.D.   On: 03/14/2021 14:36   CT T-SPINE NO CHARGE  Result Date: 03/14/2021 CLINICAL DATA:  Back injury, slipped getting out of bathtub yesterday. EXAM: CT THORACIC SPINE WITHOUT CONTRAST TECHNIQUE: Multidetector CT images of the thoracic were obtained using the standard protocol without  intravenous contrast. COMPARISON:  None. FINDINGS: Alignment: Normal. Vertebrae: No acute fracture or  focal pathologic process. Paraspinal and other soft tissues: Negative. Examination of the soft tissues is limited due to field of view Disc levels: No significant spinal canal stenosis. Other: A 1.4 cm calculus is noted in the left kidney. There is mild atherosclerotic calcification of the aorta. A 6 mm pulmonary nodule is noted in the right lower lobe. There is a nodule in the left lower lobe measuring 5 mm. IMPRESSION: 1. No acute fracture. 2. Bilateral lower lobe pulmonary nodules. The nodule in the right is unchanged from 04/18/2020. Non-contrast chest CT at 3-6 months is recommended. If the nodules are stable at time of repeat CT, then future CT at 18-24 months (from today's scan) is considered optional for low-risk patients, but is recommended for high-risk patients. This recommendation follows the consensus statement: Guidelines for Management of Incidental Pulmonary Nodules Detected on CT Images: From the Fleischner Society 2017; Radiology 2017; 284:228-243. 3. Left renal calculus. Electronically Signed   By: Thornell Sartorius M.D.   On: 03/14/2021 22:38   CT L-SPINE NO CHARGE  Result Date: 03/14/2021 CLINICAL DATA:  Initial evaluation for acute injury, fall. EXAM: CT LUMBAR SPINE WITHOUT CONTRAST TECHNIQUE: Multidetector CT imaging of the lumbar spine was performed without intravenous contrast administration. Multiplanar CT image reconstructions were also generated. COMPARISON:  Radiograph from earlier the same day. FINDINGS: Segmentation: Standard. Lowest well-formed disc space labeled the L5-S1 level. Alignment: Physiologic with preservation of the normal lumbar lordosis. No listhesis. Vertebrae: Vertebral body height maintained without acute or chronic fracture. Visualized sacrum and pelvis intact. SI joints symmetric and within normal limits. No discrete or worrisome osseous lesions. Paraspinal and  other soft tissues: Paraspinous soft tissues demonstrate no acute finding. 1.4 cm nonobstructive left renal nephrolithiasis. Fibroid uterus partially visualized. Disc levels: Mild for age degenerative spondylosis, most pronounced at L4-5. No significant spinal stenosis. Mild lower lumbar facet hypertrophy. Foramina appear patent. IMPRESSION: 1. No acute traumatic injury within the lumbar spine. 2. Mild for age degenerative spondylosis, most pronounced at L4-5. No significant stenosis. 3. 1.4 cm nonobstructive left renal nephrolithiasis. 4. Fibroid uterus. Electronically Signed   By: Rise Mu M.D.   On: 03/14/2021 23:10   CT CHEST ABDOMEN PELVIS WO CONTRAST  Result Date: 03/14/2021 CLINICAL DATA:  Polytrauma, blunt fall yesterday. Diffuse right rib pain. EXAM: CT CHEST, ABDOMEN AND PELVIS WITHOUT CONTRAST TECHNIQUE: Multidetector CT imaging of the chest, abdomen and pelvis was performed following the standard protocol without IV contrast. COMPARISON:  04/18/2020. FINDINGS: CT CHEST FINDINGS Cardiovascular: The heart is normal in size and there is a trace pericardial effusion. There is atherosclerotic calcification of the aorta without evidence of aneurysm. The pulmonary trunk is normal in caliber. Mediastinum/Nodes: No mediastinal or axillary lymphadenopathy. Evaluation of the hila is limited due to lack of IV contrast. The thyroid gland, trachea, and esophagus are within normal limits. There is a small hiatal hernia. Lungs/Pleura: Apical pleural thickening and scarring is noted bilaterally. Patchy parenchymal opacities are noted in the posterior aspect of the right lower lobe. A 6 mm pulmonary nodule is noted in the right lower lobe, axial image 82. There is a 5 mm nodule in the left lower lobe, axial image 71. No effusion or pneumothorax. Musculoskeletal: No acute fracture is identified. No chest wall hematoma. CT ABDOMEN PELVIS FINDINGS Hepatobiliary: No focal liver abnormality is seen. No biliary  ductal dilatation. Hyperdense material and a stone are present in the gallbladder, not significant changed on the prior exam. Pancreas: Unremarkable. No pancreatic ductal dilatation or surrounding  inflammatory changes. Spleen: No splenic injury or perisplenic hematoma. Adrenals/Urinary Tract: No adrenal nodule or mass. There is a 1.4 cm calcification in the mid left kidney. Punctate calcification is present in the mid right kidney. No hydronephrosis is seen bilaterally. No bladder wall thickening. A focus of air is present in the nondependent portion of the urinary bladder which may be iatrogenic. Stomach/Bowel: No bowel obstruction, free air or pneumatosis. A moderate to large amount of retained stool is present in the colon. The appendix is normal in caliber. No focal bowel wall thickening. Vascular/Lymphatic: Aortic atherosclerosis. No enlarged abdominal or pelvic lymph nodes. Reproductive: The uterus is enlarged and contains multiple calcified and noncalcified fibroids. No definite adnexal mass. Other: No free fluid. Musculoskeletal: No acute fracture. Mild degenerative changes in the lumbar spine. IMPRESSION: 1. No evidence of acute fracture or solid organ injury. 2. Parenchymal opacities along the posterior aspect of the right lower lobe, possibly representing atelectasis or scarring. The possibility of pulmonary torsion can not be excluded given history of trauma. 3. Bilateral pulmonary nodules. Non-contrast chest CT at 3-6 months is recommended. If the nodules are stable at time of repeat CT, then future CT at 18-24 months (from today's scan) is considered optional for low-risk patients, but is recommended for high-risk patients. This recommendation follows the consensus statement: Guidelines for Management of Incidental Pulmonary Nodules Detected on CT Images: From the Fleischner Society 2017; Radiology 2017; 284:228-243. 4. Cholelithiasis with hyperdense material in the gallbladder, unchanged from the  prior exam. Consider ultrasound for further evaluation on follow-up. 5. Bilateral renal calculi. 6. Fibroid uterus. Electronically Signed   By: Brett Fairy M.D.   On: 03/14/2021 23:33    Procedures Procedures   Medications Ordered in ED Medications  ondansetron (ZOFRAN) injection 4 mg (4 mg Intravenous Not Given 03/14/21 2312)  lidocaine (LIDODERM) 5 % 1 patch (has no administration in time range)  morphine 4 MG/ML injection 4 mg (4 mg Intramuscular Given 03/14/21 2114)   ED Course  I have reviewed the triage vital signs and the nursing notes.  Pertinent labs & imaging results that were available during my care of the patient were reviewed by me and considered in my medical decision making (see chart for details).  Pleasant 60 year old here for evaluation after fall which occurred after she stood up from using the commode, subsequently feeling lightheaded.  She denies any syncopal episodes, hitting head, LOC.  She is able to get up off the ground.  Since then has had diffuse right posterior rib pain, right flank pain, midline lower thoracic, lumbar pain.  She has no radicular symptoms, no bowel or bladder incontinence, saddle paresthesia.  No pain to bilateral upper and lower extremities.  No headache, vision changes, numbness, weakness, chest pain, shortness of breath.  Heart lungs clear.  Neurovascularly intact with nonfocal neuro exam.  Given her lightheadedness episode we will plan on labs, EKG, imaging and reassess.  Labs and imaging personally reviewed and interpreted:  CBC>> refused labs CMP>> refused labs Dg lumbar without acute abnormality Dg chest/right ribs without acute abnormality CT C/A/P with pulm nodules, rec FU outpatient, atelectasis vs scarring, no rib fx, fibroid uterus, gallstones however unchanged from previous CT thoracic without acute fx CT lumbar without acute abnormality  IV team attempted IV. Patient refused. Discussed risk vs benefits. Voices understanding  however declines blood work, imaging requiring IV assess.  Imaging here reassessing. Ambulatory without difficulty.  Symptoms improved.  She is without tachycardia, tachypnea, hypoxia.  Will dc  home with symptomatic management.  The patient has been appropriately medically screened and/or stabilized in the ED. I have low suspicion for any other emergent medical condition which would require further screening, evaluation or treatment in the ED or require inpatient management.  Patient is hemodynamically stable and in no acute distress.  Patient able to ambulate in department prior to ED.  Evaluation does not show acute pathology that would require ongoing or additional emergent interventions while in the emergency department or further inpatient treatment.  I have discussed the diagnosis with the patient and answered all questions.  Pain is been managed while in the emergency department and patient has no further complaints prior to discharge.  Patient is comfortable with plan discussed in room and is stable for discharge at this time.  I have discussed strict return precautions for returning to the emergency department.  Patient was encouraged to follow-up with PCP/specialist refer to at discharge.     MDM Rules/Calculators/A&P                             Final Clinical Impression(s) / ED Diagnoses Final diagnoses:  Fall, initial encounter  Acute midline low back pain without sciatica    Rx / DC Orders ED Discharge Orders          Ordered    naproxen (NAPROSYN) 500 MG tablet  2 times daily        03/14/21 2323    lidocaine (LIDODERM) 5 %  Every 24 hours        03/14/21 2323    HYDROcodone-acetaminophen (NORCO/VICODIN) 5-325 MG tablet  Every 4 hours PRN        03/14/21 2338             Ilia Dimaano A, PA-C 03/14/21 2341    Truddie Hidden, MD 03/16/21 [phone]

## 2021-03-14 NOTE — ED Triage Notes (Signed)
Per [facility] pt coming from home reports slipping in bathroom yesterday hitting her lower right back on toilet. Was able to get herself up. Took an Advil and put icy hot on back.

## 2021-03-15 ENCOUNTER — Other Ambulatory Visit: Payer: Self-pay

## 2021-03-16 ENCOUNTER — Other Ambulatory Visit: Payer: Self-pay

## 2021-04-08 ENCOUNTER — Emergency Department (HOSPITAL_COMMUNITY): Payer: Self-pay

## 2021-04-08 ENCOUNTER — Observation Stay (HOSPITAL_COMMUNITY)
Admission: EM | Admit: 2021-04-08 | Discharge: 2021-04-09 | Disposition: A | Discharge status: Home / Self Care | Payer: Self-pay | Attending: Internal Medicine | Admitting: Internal Medicine

## 2021-04-08 ENCOUNTER — Encounter (HOSPITAL_COMMUNITY): Payer: Self-pay | Admitting: Internal Medicine

## 2021-04-08 DIAGNOSIS — Z20822 Contact with and (suspected) exposure to covid-19: Secondary | ICD-10-CM | POA: Insufficient documentation

## 2021-04-08 DIAGNOSIS — F3341 Major depressive disorder, recurrent, in partial remission: Secondary | ICD-10-CM

## 2021-04-08 DIAGNOSIS — N179 Acute kidney failure, unspecified: Secondary | ICD-10-CM | POA: Insufficient documentation

## 2021-04-08 DIAGNOSIS — I10 Essential (primary) hypertension: Secondary | ICD-10-CM | POA: Insufficient documentation

## 2021-04-08 DIAGNOSIS — D509 Iron deficiency anemia, unspecified: Principal | ICD-10-CM | POA: Insufficient documentation

## 2021-04-08 DIAGNOSIS — D649 Anemia, unspecified: Secondary | ICD-10-CM | POA: Diagnosis present

## 2021-04-08 DIAGNOSIS — Z7951 Long term (current) use of inhaled steroids: Secondary | ICD-10-CM | POA: Insufficient documentation

## 2021-04-08 DIAGNOSIS — F3342 Major depressive disorder, recurrent, in full remission: Secondary | ICD-10-CM | POA: Diagnosis present

## 2021-04-08 DIAGNOSIS — J449 Chronic obstructive pulmonary disease, unspecified: Secondary | ICD-10-CM | POA: Insufficient documentation

## 2021-04-08 DIAGNOSIS — K219 Gastro-esophageal reflux disease without esophagitis: Secondary | ICD-10-CM

## 2021-04-08 DIAGNOSIS — Z79899 Other long term (current) drug therapy: Secondary | ICD-10-CM | POA: Insufficient documentation

## 2021-04-08 DIAGNOSIS — R55 Syncope and collapse: Secondary | ICD-10-CM | POA: Diagnosis present

## 2021-04-08 LAB — CBC WITH DIFFERENTIAL/PLATELET
Abs Immature Granulocytes: 0.13 10*3/uL — ABNORMAL HIGH (ref 0.00–0.07)
Basophils Absolute: 0 10*3/uL (ref 0.0–0.1)
Basophils Relative: 0 %
Eosinophils Absolute: 0.2 10*3/uL (ref 0.0–0.5)
Eosinophils Relative: 1 %
HCT: 16.9 % — ABNORMAL LOW (ref 36.0–46.0)
Hemoglobin: 5.3 g/dL — CL (ref 12.0–15.0)
Immature Granulocytes: 1 %
Lymphocytes Relative: 13 %
Lymphs Abs: 1.9 10*3/uL (ref 0.7–4.0)
MCH: 18.1 pg — ABNORMAL LOW (ref 26.0–34.0)
MCHC: 31.4 g/dL (ref 30.0–36.0)
MCV: 57.7 fL — ABNORMAL LOW (ref 80.0–100.0)
Monocytes Absolute: 0.4 10*3/uL (ref 0.1–1.0)
Monocytes Relative: 3 %
Neutro Abs: 11.6 10*3/uL — ABNORMAL HIGH (ref 1.7–7.7)
Neutrophils Relative %: 82 %
Platelets: 330 10*3/uL (ref 150–400)
RBC: 2.93 MIL/uL — ABNORMAL LOW (ref 3.87–5.11)
RDW: 23.8 % — ABNORMAL HIGH (ref 11.5–15.5)
WBC: 14.2 10*3/uL — ABNORMAL HIGH (ref 4.0–10.5)
nRBC: 0 % (ref 0.0–0.2)

## 2021-04-08 LAB — COMPREHENSIVE METABOLIC PANEL
ALT: 8 U/L (ref 0–44)
AST: 15 U/L (ref 15–41)
Albumin: 3.4 g/dL — ABNORMAL LOW (ref 3.5–5.0)
Alkaline Phosphatase: 66 U/L (ref 38–126)
Anion gap: 10 (ref 5–15)
BUN: 43 mg/dL — ABNORMAL HIGH (ref 8–23)
CO2: 19 mmol/L — ABNORMAL LOW (ref 22–32)
Calcium: 10.7 mg/dL — ABNORMAL HIGH (ref 8.9–10.3)
Chloride: 110 mmol/L (ref 98–111)
Creatinine, Ser: 1.5 mg/dL — ABNORMAL HIGH (ref 0.44–1.00)
GFR, Estimated: 39 mL/min — ABNORMAL LOW (ref >60)
Glucose, Bld: 139 mg/dL — ABNORMAL HIGH (ref 70–99)
Potassium: 3.3 mmol/L — ABNORMAL LOW (ref 3.5–5.1)
Sodium: 139 mmol/L (ref 135–145)
Total Bilirubin: 0.4 mg/dL (ref 0.3–1.2)
Total Protein: 6.7 g/dL (ref 6.5–8.1)

## 2021-04-08 LAB — RESP PANEL BY RT-PCR (FLU A&B, COVID) ARPGX2
Influenza A by PCR: NEGATIVE
Influenza B by PCR: NEGATIVE
SARS Coronavirus 2 by RT PCR: NEGATIVE

## 2021-04-08 LAB — RETICULOCYTES
Immature Retic Fract: 29.5 % — ABNORMAL HIGH (ref 2.3–15.9)
RBC.: 2.9 MIL/uL — ABNORMAL LOW (ref 3.87–5.11)
Retic Count, Absolute: 46.5 10*3/uL (ref 19.0–186.0)
Retic Ct Pct: 1.6 % (ref 0.4–3.1)

## 2021-04-08 LAB — PREPARE RBC (CROSSMATCH)

## 2021-04-08 MED ORDER — FLUOXETINE HCL 20 MG PO CAPS
80.0000 mg | ORAL_CAPSULE | Freq: Every day | ORAL | Status: DC
  Administered 2021-04-09: 80 mg via ORAL
  Filled 2021-04-08: qty 4

## 2021-04-08 MED ORDER — GABAPENTIN 400 MG PO CAPS
400.0000 mg | ORAL_CAPSULE | Freq: Every day | ORAL | Status: DC
  Administered 2021-04-09: 400 mg via ORAL
  Filled 2021-04-08: qty 1

## 2021-04-08 MED ORDER — SODIUM CHLORIDE 0.9 % IV SOLN
10.0000 mL/h | Freq: Once | INTRAVENOUS | Status: AC
  Administered 2021-04-08: 10 mL/h via INTRAVENOUS

## 2021-04-08 MED ORDER — MIRTAZAPINE 30 MG PO TABS: 30.0000 mg | ORAL_TABLET | Freq: Every day | ORAL | Status: DC

## 2021-04-08 MED ORDER — PANTOPRAZOLE SODIUM 40 MG IV SOLR
40.0000 mg | INTRAVENOUS | Status: DC
  Administered 2021-04-08: 40 mg via INTRAVENOUS
  Filled 2021-04-08: qty 40

## 2021-04-08 MED ORDER — ONDANSETRON HCL 4 MG/2ML IJ SOLN: 4.0000 mg | Freq: Four times a day (QID) | INTRAMUSCULAR | Status: DC | PRN

## 2021-04-08 MED ORDER — FLUTICASONE FUROATE-VILANTEROL 200-25 MCG/ACT IN AEPB
1.0000 | INHALATION_SPRAY | Freq: Every day | RESPIRATORY_TRACT | Status: DC
  Administered 2021-04-09: 1 via RESPIRATORY_TRACT
  Filled 2021-04-08: qty 28

## 2021-04-08 MED ORDER — ONDANSETRON HCL 4 MG PO TABS: 4.0000 mg | ORAL_TABLET | Freq: Four times a day (QID) | ORAL | Status: DC | PRN

## 2021-04-08 MED ORDER — ACETAMINOPHEN 650 MG RE SUPP: 650.0000 mg | Freq: Four times a day (QID) | RECTAL | Status: DC | PRN

## 2021-04-08 MED ORDER — AMLODIPINE BESYLATE 10 MG PO TABS
10.0000 mg | ORAL_TABLET | Freq: Every day | ORAL | Status: DC
  Administered 2021-04-09: 10 mg via ORAL
  Filled 2021-04-08: qty 1

## 2021-04-08 MED ORDER — LACTATED RINGERS IV BOLUS
1000.0000 mL | Freq: Once | INTRAVENOUS | Status: AC
Start: 2021-04-08 — End: 2021-04-08
  Administered 2021-04-08: 1000 mL via INTRAVENOUS

## 2021-04-08 MED ORDER — QUETIAPINE FUMARATE 200 MG PO TABS
300.0000 mg | ORAL_TABLET | Freq: Every day | ORAL | Status: DC
  Administered 2021-04-09: 300 mg via ORAL
  Filled 2021-04-08: qty 1

## 2021-04-08 MED ORDER — ACETAMINOPHEN 325 MG PO TABS: 650.0000 mg | ORAL_TABLET | Freq: Four times a day (QID) | ORAL | Status: DC | PRN

## 2021-04-08 NOTE — H&P (Signed)
History and Physical    Nicole Cunningham NOB:[id number] DOB: Apr 23, 1960 DOA: 04/08/2021  PCP: [name], MD  Patient coming from: Home.  I have personally briefly reviewed patient's old medical records in [facility]  Chief Complaint: Syncopal episode.  HPI: Nicole Cunningham is a 61 y.o. female with medical history significant of symptomatic anemia iron deficiency microcytic anemia, COPD, hypertension, depression, COVID-19, hypomagnesemia, history of sepsis due to UTI who is coming to the emergency department complaints of having a syncopal episode after experiencing lightheadedness and vision changes this morning.  No chest pain, palpitations, diaphoresis, PND or recent lower extremity edema.  The patient denied abdominal pain, hematemesis, melena or hematochezia.  No nausea, emesis, diarrhea or constipation.  She did refuse rectal exam to check for FOBT earlier.  She was very adamant about not having blood in her stools after I asked her about letting us know when she has to have a BM so we can collect a stool sample.  However, the patient refused this as well.  She has not follow with gastroenterology as it was suggested by Dr. Tyson Babinski during her last admission.  She agreed to discuss this with Dr. Andrey Campanile, her PCP when she goes to see her at the clinic.  ED Course: Initial vital signs were temperature 97.3 F, pulse 128, respiration 16, BP 84/52 mmHg and O2 sat 100% on room air.  The patient received 1000 mL of LR bolus.  A 2 PRBC transfusion was ordered.  Lab work: CBC is her white count 14.2, hemoglobin 5.3 g/dL with an MCV of 94.7 fL and platelets of 330.  CMP showed a sodium of 139, potassium 3.3, chloride 110 and CO2 19 mmol/L.  BUN 43, creatinine 1.50 and calcium 10.7 mg/dL.  Albumin was 3.4 g/dL the rest of the liver function tests were unremarkable.    Imaging: No acute cardiopulmonary pathology on chest x-ray.  No acute injury on CT head and C-spine.  She has DDD cervical  spine.  Please see images and full daily report for further details.  Review of Systems: As per HPI otherwise all other systems reviewed and are negative.  Past Medical History:  Diagnosis Date   Bronchitis    COPD (chronic obstructive pulmonary disease) (HCC)    Hypertension     No past surgical history on file.  Social History  reports that she has never smoked. She has never used smokeless tobacco. She reports that she does not currently use alcohol. She reports that she does not use drugs.  No Known Allergies  Family History  Problem Relation Age of Onset   Diabetes Mother    Diabetes Father    Prior to Admission medications   Medication Sig Start Date End Date Taking? Authorizing Provider  albuterol (VENTOLIN HFA) 108 (90 Base) MCG/ACT inhaler Inhale 1-2 puffs into the lungs every 6 (six) hours as needed for wheezing or shortness of breath. 10/12/20   Rema Fendt, NP  amLODipine (NORVASC) 10 MG tablet Take 1 tablet (10 mg total) by mouth daily. 03/01/21 05/30/21  [name], MD  cephALEXin (KEFLEX) 500 MG capsule TAKE 1 CAPSULE BY MOUTH EVERY 12 HOURS FOR 6 DOSES. 04/22/20 04/22/21  Pokhrel, Rebekah Chesterfield, MD  FLUoxetine (PROZAC) 40 MG capsule Take 2 capsules (80 mg total) by mouth daily. 02/15/21   Nwoko, Stephens Shire E, PA  fluticasone furoate-vilanterol (BREO ELLIPTA) 200-25 MCG/INH AEPB Inhale 1 puff into the lungs daily. 10/12/20   Rema Fendt, NP  gabapentin (NEURONTIN)  400 MG capsule Take 1 capsule (400 mg total) by mouth at bedtime. 02/15/21   Nwoko, Tommas Olp, PA  HYDROcodone-acetaminophen (NORCO/VICODIN) 5-325 MG tablet Take 1 tablet by mouth every 4 (four) hours as needed. 03/14/21   Henderly, Britni A, PA-C  lidocaine (LIDODERM) 5 % Place 1 patch onto the skin daily. Remove & Discard patch within 12 hours or as directed by MD 03/14/21   Henderly, Britni A, PA-C  losartan (COZAAR) 100 MG tablet Take 1 tablet (100 mg total) by mouth daily. 03/01/21 05/30/21  [name], MD   magnesium oxide (MAG-OX) 400 (241.3 Mg) MG tablet TAKE 1 TABLET BY MOUTH 2 TIMES DAILY 04/22/20 04/22/21  Pokhrel, Rebekah Chesterfield, MD  mirtazapine (REMERON) 30 MG tablet Take 1 tablet (30 mg total) by mouth at bedtime. 02/15/21   Nwoko, Tommas Olp, PA  naproxen (NAPROSYN) 500 MG tablet Take 1 tablet (500 mg total) by mouth 2 (two) times daily. 03/14/21   Henderly, Britni A, PA-C  pantoprazole (PROTONIX) 40 MG tablet Take 1 tablet (40 mg total) by mouth daily. 12/09/20 03/09/21  Rema Fendt, NP  predniSONE (DELTASONE) 20 MG tablet Take 2 tablets (40 mg total) by mouth daily. 01/05/21   Gwyneth Sprout, MD  QUEtiapine (SEROQUEL) 300 MG tablet Take 1 tablet (300 mg total) by mouth at bedtime. Take 2 tablets at bedtime 02/15/21   Nwoko, Stephens Shire E, PA  Ipratropium-Albuterol (COMBIVENT RESPIMAT) 20-100 MCG/ACT AERS respimat Inhale 1 puff into the lungs every 6 (six) hours. Patient not taking: No sig reported 12/15/19 04/22/20  Storm Frisk, MD  losartan-hydrochlorothiazide (HYZAAR) 100-25 MG tablet Take 1 tablet by mouth daily. 12/15/19 04/22/20  Storm Frisk, MD    Physical Exam: Vitals:   04/08/21 1400 04/08/21 1415 04/08/21 1430 04/08/21 1445  BP: 111/75 112/66 111/64 114/66  Pulse: (!) 111 100 96 96  Resp: 18 18 14 15   Temp:      TempSrc:      SpO2: 100% 100% 100% 100%    Constitutional: Under nourished.  Chronically ill-appearing.  NAD, calm, comfortable Eyes: PERRL, lids and conjunctivae show pallor. ENMT: Mucous membranes are moist. Posterior pharynx clear of any exudate or lesions. Neck: normal, supple, no masses, no thyromegaly Respiratory: clear to auscultation bilaterally, no wheezing, no crackles. Normal respiratory effort. No accessory muscle use.  Cardiovascular: Regular rate and rhythm, no murmurs / rubs / gallops. No extremity edema. 2+ pedal pulses. No carotid bruits.  Abdomen: No distention.  Soft, no tenderness, no masses palpated. No hepatosplenomegaly. Bowel sounds  positive.  Musculoskeletal: no clubbing / cyanosis.  Mild generalized weakness.. Good ROM, no contractures. Normal muscle tone.  Skin: Positive skin pallor. Neurologic: CN 2-12 grossly intact. Sensation intact, DTR normal. Strength 5/5 in all 4.  Psychiatric: Normal judgment and insight. Alert and oriented x 3.  Labs on Admission: I have personally reviewed following labs and imaging studies  CBC: Recent Labs  Lab 04/08/21 1313  WBC 14.2*  NEUTROABS 11.6*  HGB 5.3*  HCT 16.9*  MCV 57.7*  PLT 330    Basic Metabolic Panel: Recent Labs  Lab 04/08/21 1313  NA 139  K 3.3*  CL 110  CO2 19*  GLUCOSE 139*  BUN 43*  CREATININE 1.50*  CALCIUM 10.7*    GFR: CrCl cannot be calculated (Unknown ideal weight.).  Liver Function Tests: Recent Labs  Lab 04/08/21 1313  AST 15  ALT 8  ALKPHOS 66  BILITOT 0.4  PROT 6.7  ALBUMIN 3.4*  Home.  Radiological Exams on Admission: CT Head Wo Contrast  Result Date: 04/08/2021 CLINICAL DATA:  61 year old female with history of trauma from a fall during a syncopal event. Head and neck pain. EXAM: CT HEAD WITHOUT CONTRAST CT CERVICAL SPINE WITHOUT CONTRAST TECHNIQUE: Multidetector CT imaging of the head and cervical spine was performed following the standard protocol without intravenous contrast. Multiplanar CT image reconstructions of the cervical spine were also generated. RADIATION DOSE REDUCTION: This exam was performed according to the departmental dose-optimization program which includes automated exposure control, adjustment of the mA and/or kV according to patient size and/or use of iterative reconstruction technique. COMPARISON:  No priors. FINDINGS: CT HEAD FINDINGS Brain: Physiologic calcifications are noted in the basal ganglia bilaterally. No evidence of acute infarction, hemorrhage, hydrocephalus, extra-axial collection or mass lesion/mass effect. Vascular: No hyperdense vessel or unexpected calcification. Skull: Normal. Negative for  fracture or focal lesion. Sinuses/Orbits: No acute finding. Other: None. CT CERVICAL SPINE FINDINGS Alignment: Normal. Skull base and vertebrae: No acute fracture. No primary bone lesion or focal pathologic process. Soft tissues and spinal canal: No prevertebral fluid or swelling. No visible canal hematoma. Disc levels: Multilevel degenerative disc disease, most pronounced at C5-C6. Mild multilevel facet arthropathy. Upper chest: Pleuroparenchymal scarring in the apices of both lungs (right greater than left). Other: None. IMPRESSION: 1. No evidence of significant acute traumatic injury to the skull, brain or cervical spine. The appearance of the brain is normal. 2. Mild multilevel degenerative disc disease and cervical spondylosis, as above. Electronically Signed   By: Trudie Reed M.D.   On: 04/08/2021 13:54   CT Cervical Spine Wo Contrast  Result Date: 04/08/2021 CLINICAL DATA:  61 year old female with history of trauma from a fall during a syncopal event. Head and neck pain. EXAM: CT HEAD WITHOUT CONTRAST CT CERVICAL SPINE WITHOUT CONTRAST TECHNIQUE: Multidetector CT imaging of the head and cervical spine was performed following the standard protocol without intravenous contrast. Multiplanar CT image reconstructions of the cervical spine were also generated. RADIATION DOSE REDUCTION: This exam was performed according to the departmental dose-optimization program which includes automated exposure control, adjustment of the mA and/or kV according to patient size and/or use of iterative reconstruction technique. COMPARISON:  No priors. FINDINGS: CT HEAD FINDINGS Brain: Physiologic calcifications are noted in the basal ganglia bilaterally. No evidence of acute infarction, hemorrhage, hydrocephalus, extra-axial collection or mass lesion/mass effect. Vascular: No hyperdense vessel or unexpected calcification. Skull: Normal. Negative for fracture or focal lesion. Sinuses/Orbits: No acute finding. Other: None.  CT CERVICAL SPINE FINDINGS Alignment: Normal. Skull base and vertebrae: No acute fracture. No primary bone lesion or focal pathologic process. Soft tissues and spinal canal: No prevertebral fluid or swelling. No visible canal hematoma. Disc levels: Multilevel degenerative disc disease, most pronounced at C5-C6. Mild multilevel facet arthropathy. Upper chest: Pleuroparenchymal scarring in the apices of both lungs (right greater than left). Other: None. IMPRESSION: 1. No evidence of significant acute traumatic injury to the skull, brain or cervical spine. The appearance of the brain is normal. 2. Mild multilevel degenerative disc disease and cervical spondylosis, as above. Electronically Signed   By: Trudie Reed M.D.   On: 04/08/2021 13:54   DG Chest Portable 1 View  Result Date: 04/08/2021 CLINICAL DATA:  Shortness of breath EXAM: PORTABLE CHEST 1 VIEW COMPARISON:  03/14/2021 FINDINGS: Cardiac size is within normal limits. There are no signs of pulmonary edema or focal pulmonary consolidation. There is no pleural effusion or pneumothorax. Low position of diaphragms suggests possible COPD.  IMPRESSION: There are no signs of pulmonary edema or focal pulmonary consolidation. Electronically Signed   By: Ernie Avena M.D.   On: 04/08/2021 14:20    EKG: Independently reviewed.  Vent. rate 122 BPM PR interval 153 ms QRS duration 92 ms QT/QTcB 320/456 ms P-R-T axes 0 77 50 Sinus tachycardia  Assessment/Plan Principal Problem:   Syncope In the setting of   Symptomatic microcytic anemia Likely due to chronic blood loss. Observation/PCU. Continue PRBC x2 transfusion. Declined rectal examination. Declined to provide stool sample after BM. Advised to follow with gastroenterology as an outpatient. Agreed to discuss this issue with Dr. Andrey Campanile, her PCP. Follow-up H&H in a.m. if the patient allows blood draw.  Active Problems:   AKI (acute kidney injury) (HCC) Hold angiotensin receptor  blocker. Proceed with blood transfusion. Avoid hypotension. Avoid nephrotoxins. Monitor intake and output. Follow-up renal function electrolytes.    MDD (major depressive disorder), recurrent, in full remission (HCC) Continue fluoxetine 80 mg p.o. daily.    COPD with chronic bronchitis (HCC) Supplemental oxygen as needed. Bronchodilators as needed.    Essential hypertension Hold losartan. Continue amlodipine 10 mg p.o. daily.   Monitor BP, HR, renal function electrolytes.    Hypercalcemia Recheck calcium level in AM. Recheck albumin level in AM. Further work-up depending on results.    DVT prophylaxis: SCDs. Code Status:   Full code. Family Communication:   Disposition Plan:   Patient is from:  Home.  Anticipated DC to:  Home.  Anticipated DC date:  24 hours.  Anticipated DC barriers: Clinical condition.  Consults called:   Admission status:  Observation/telemetry.  Severity of Illness: High severity in the setting of anemia secondary to iron deficiency.  Bobette Mo MD [facility]  How to contact the [facility] Attending or Consulting provider 7A - 7P or covering provider during after hours 7P -7A, for this patient?   Check the care team in [facility] and look for a) attending/consulting [facility] provider listed and b) the [facility] team listed Log into [email or web address] and use [place]'s universal password to access. If you do not have the password, please contact the hospital operator. Locate the [facility] provider you are looking for under [facility] and page to a number that you can be directly reached. If you still have difficulty reaching the provider, please page the [facility] (Director on Call) for the Hospitalists listed on amion for assistance.  04/08/2021, 4:18 PM   This document was prepared using Dragon voice recognition software and may contain some unintended transcription errors.

## 2021-04-08 NOTE — ED Provider Notes (Signed)
[facility] DEPT Provider Note   CSN: [id number] Arrival date & time: 04/08/21  1239     History  Chief Complaint  Patient presents with   Loss of Consciousness    Nicole Cunningham is a 61 y.o. female.  Presenting to ER for episode of syncope.  States that when she woke up this morning she felt generally weak but did not have any specific symptoms.  However when she got up to walk later in the morning she felt very lightheaded and then passed out.  Does not think she hit her head but is having some pain at the back of her head at present.  Currently denies any specific chest pain or difficulty breathing.  No abdominal pain.  Denies any dark stools, blood in stool, no nausea or vomiting.  No abdominal pain.  Completed chart review, reviewed past labs, has had history of anemia, last hemoglobin was 7.6 in October 2022.  HPI     Home Medications Prior to Admission medications   Medication Sig Start Date End Date Taking? Authorizing Provider  albuterol (VENTOLIN HFA) 108 (90 Base) MCG/ACT inhaler Inhale 1-2 puffs into the lungs every 6 (six) hours as needed for wheezing or shortness of breath. 10/12/20   Camillia Herter, NP  amLODipine (NORVASC) 10 MG tablet Take 1 tablet (10 mg total) by mouth daily. 03/01/21 05/30/21  [name], MD  cephALEXin (KEFLEX) 500 MG capsule TAKE 1 CAPSULE BY MOUTH EVERY 12 HOURS FOR 6 DOSES. 04/22/20 04/22/21  Pokhrel, Corrie Mckusick, MD  FLUoxetine (PROZAC) 40 MG capsule Take 2 capsules (80 mg total) by mouth daily. 02/15/21   Nwoko, Isidoro Donning E, PA  fluticasone furoate-vilanterol (BREO ELLIPTA) 200-25 MCG/INH AEPB Inhale 1 puff into the lungs daily. 10/12/20   Camillia Herter, NP  gabapentin (NEURONTIN) 400 MG capsule Take 1 capsule (400 mg total) by mouth at bedtime. 02/15/21   Nwoko, Terese Door, PA  HYDROcodone-acetaminophen (NORCO/VICODIN) 5-325 MG tablet Take 1 tablet by mouth every 4 (four) hours as needed. 03/14/21   Henderly, Britni  A, PA-C  lidocaine (LIDODERM) 5 % Place 1 patch onto the skin daily. Remove & Discard patch within 12 hours or as directed by MD 03/14/21   Henderly, Britni A, PA-C  losartan (COZAAR) 100 MG tablet Take 1 tablet (100 mg total) by mouth daily. 03/01/21 05/30/21  [name], MD  magnesium oxide (MAG-OX) 400 (241.3 Mg) MG tablet TAKE 1 TABLET BY MOUTH 2 TIMES DAILY 04/22/20 04/22/21  Pokhrel, Corrie Mckusick, MD  mirtazapine (REMERON) 30 MG tablet Take 1 tablet (30 mg total) by mouth at bedtime. 02/15/21   Nwoko, Terese Door, PA  naproxen (NAPROSYN) 500 MG tablet Take 1 tablet (500 mg total) by mouth 2 (two) times daily. 03/14/21   Henderly, Britni A, PA-C  pantoprazole (PROTONIX) 40 MG tablet Take 1 tablet (40 mg total) by mouth daily. 12/09/20 03/09/21  Camillia Herter, NP  predniSONE (DELTASONE) 20 MG tablet Take 2 tablets (40 mg total) by mouth daily. 01/05/21   Blanchie Dessert, MD  QUEtiapine (SEROQUEL) 300 MG tablet Take 1 tablet (300 mg total) by mouth at bedtime. Take 2 tablets at bedtime 02/15/21   Nwoko, Isidoro Donning E, PA  Ipratropium-Albuterol (COMBIVENT RESPIMAT) 20-100 MCG/ACT AERS respimat Inhale 1 puff into the lungs every 6 (six) hours. Patient not taking: No sig reported 12/15/19 04/22/20  Elsie Stain, MD  losartan-hydrochlorothiazide (HYZAAR) 100-25 MG tablet Take 1 tablet by mouth daily. 12/15/19 04/22/20  Elsie Stain,  MD      Allergies    Patient has no known allergies.    Review of Systems   Review of Systems  Constitutional:  Positive for fatigue. Negative for chills and fever.  HENT:  Negative for ear pain and sore throat.   Eyes:  Negative for pain and visual disturbance.  Respiratory:  Negative for cough and shortness of breath.   Cardiovascular:  Negative for chest pain and palpitations.  Gastrointestinal:  Negative for abdominal pain and vomiting.  Genitourinary:  Negative for dysuria and hematuria.  Musculoskeletal:  Negative for arthralgias and back pain.  Skin:   Negative for color change and rash.  Neurological:  Positive for syncope and light-headedness. Negative for seizures.  All other systems reviewed and are negative.  Physical Exam Updated Vital Signs BP 114/66    Pulse 96    Temp (!) 97.3 F (36.3 C) (Oral)    Resp 15    SpO2 100%  Physical Exam Vitals and nursing note reviewed.  Constitutional:      General: She is not in acute distress.    Appearance: She is well-developed.  HENT:     Head: Normocephalic and atraumatic.  Eyes:     Conjunctiva/sclera: Conjunctivae normal.     Comments: Pale conjunctiva bilaterally  Cardiovascular:     Rate and Rhythm: Normal rate and regular rhythm.     Heart sounds: No murmur heard. Pulmonary:     Effort: Pulmonary effort is normal. No respiratory distress.     Breath sounds: Normal breath sounds.  Abdominal:     Palpations: Abdomen is soft.     Tenderness: There is no abdominal tenderness.  Musculoskeletal:        General: No swelling or deformity.     Cervical back: Neck supple.  Skin:    General: Skin is warm and dry.     Capillary Refill: Capillary refill takes less than 2 seconds.     Coloration: Skin is pale.  Neurological:     General: No focal deficit present.     Mental Status: She is alert.  Psychiatric:        Mood and Affect: Mood normal.    ED Results / Procedures / Treatments   Labs (all labs ordered are listed, but only abnormal results are displayed) Labs Reviewed  CBC WITH DIFFERENTIAL/PLATELET - Abnormal; Notable for the following components:      Result Value   WBC 14.2 (*)    RBC 2.93 (*)    Hemoglobin 5.3 (*)    HCT 16.9 (*)    MCV 57.7 (*)    MCH 18.1 (*)    RDW 23.8 (*)    Neutro Abs 11.6 (*)    Abs Immature Granulocytes 0.13 (*)    All other components within normal limits  COMPREHENSIVE METABOLIC PANEL - Abnormal; Notable for the following components:   Potassium 3.3 (*)    CO2 19 (*)    Glucose, Bld 139 (*)    BUN 43 (*)    Creatinine, Ser 1.50  (*)    Calcium 10.7 (*)    Albumin 3.4 (*)    GFR, Estimated 39 (*)    All other components within normal limits  RETICULOCYTES - Abnormal; Notable for the following components:   RBC. 2.90 (*)    Immature Retic Fract 29.5 (*)    All other components within normal limits  CULTURE, BLOOD (ROUTINE X 2)  CULTURE, BLOOD (ROUTINE X 2)  VITAMIN B12  FOLATE  IRON AND TIBC  FERRITIN  POC OCCULT BLOOD, ED  TYPE AND SCREEN  PREPARE RBC (CROSSMATCH)    EKG EKG Interpretation  Date/Time:  Saturday April 08 2021 13:01:27 EST Ventricular Rate:  122 PR Interval:  153 QRS Duration: 92 QT Interval:  320 QTC Calculation: 456 R Axis:   77 Text Interpretation: Sinus tachycardia Confirmed by Madalyn Rob [phone]) on 04/08/2021 1:47:57 PM  Radiology CT Head Wo Contrast  Result Date: 04/08/2021 CLINICAL DATA:  61 year old female with history of trauma from a fall during a syncopal event. Head and neck pain. EXAM: CT HEAD WITHOUT CONTRAST CT CERVICAL SPINE WITHOUT CONTRAST TECHNIQUE: Multidetector CT imaging of the head and cervical spine was performed following the standard protocol without intravenous contrast. Multiplanar CT image reconstructions of the cervical spine were also generated. RADIATION DOSE REDUCTION: This exam was performed according to the departmental dose-optimization program which includes automated exposure control, adjustment of the mA and/or kV according to patient size and/or use of iterative reconstruction technique. COMPARISON:  No priors. FINDINGS: CT HEAD FINDINGS Brain: Physiologic calcifications are noted in the basal ganglia bilaterally. No evidence of acute infarction, hemorrhage, hydrocephalus, extra-axial collection or mass lesion/mass effect. Vascular: No hyperdense vessel or unexpected calcification. Skull: Normal. Negative for fracture or focal lesion. Sinuses/Orbits: No acute finding. Other: None. CT CERVICAL SPINE FINDINGS Alignment: Normal. Skull base and  vertebrae: No acute fracture. No primary bone lesion or focal pathologic process. Soft tissues and spinal canal: No prevertebral fluid or swelling. No visible canal hematoma. Disc levels: Multilevel degenerative disc disease, most pronounced at C5-C6. Mild multilevel facet arthropathy. Upper chest: Pleuroparenchymal scarring in the apices of both lungs (right greater than left). Other: None. IMPRESSION: 1. No evidence of significant acute traumatic injury to the skull, brain or cervical spine. The appearance of the brain is normal. 2. Mild multilevel degenerative disc disease and cervical spondylosis, as above. Electronically Signed   By: Vinnie Langton M.D.   On: 04/08/2021 13:54   CT Cervical Spine Wo Contrast  Result Date: 04/08/2021 CLINICAL DATA:  61 year old female with history of trauma from a fall during a syncopal event. Head and neck pain. EXAM: CT HEAD WITHOUT CONTRAST CT CERVICAL SPINE WITHOUT CONTRAST TECHNIQUE: Multidetector CT imaging of the head and cervical spine was performed following the standard protocol without intravenous contrast. Multiplanar CT image reconstructions of the cervical spine were also generated. RADIATION DOSE REDUCTION: This exam was performed according to the departmental dose-optimization program which includes automated exposure control, adjustment of the mA and/or kV according to patient size and/or use of iterative reconstruction technique. COMPARISON:  No priors. FINDINGS: CT HEAD FINDINGS Brain: Physiologic calcifications are noted in the basal ganglia bilaterally. No evidence of acute infarction, hemorrhage, hydrocephalus, extra-axial collection or mass lesion/mass effect. Vascular: No hyperdense vessel or unexpected calcification. Skull: Normal. Negative for fracture or focal lesion. Sinuses/Orbits: No acute finding. Other: None. CT CERVICAL SPINE FINDINGS Alignment: Normal. Skull base and vertebrae: No acute fracture. No primary bone lesion or focal pathologic  process. Soft tissues and spinal canal: No prevertebral fluid or swelling. No visible canal hematoma. Disc levels: Multilevel degenerative disc disease, most pronounced at C5-C6. Mild multilevel facet arthropathy. Upper chest: Pleuroparenchymal scarring in the apices of both lungs (right greater than left). Other: None. IMPRESSION: 1. No evidence of significant acute traumatic injury to the skull, brain or cervical spine. The appearance of the brain is normal. 2. Mild multilevel degenerative disc disease and cervical spondylosis, as above. Electronically Signed  By: Vinnie Langton M.D.   On: 04/08/2021 13:54   DG Chest Portable 1 View  Result Date: 04/08/2021 CLINICAL DATA:  Shortness of breath EXAM: PORTABLE CHEST 1 VIEW COMPARISON:  03/14/2021 FINDINGS: Cardiac size is within normal limits. There are no signs of pulmonary edema or focal pulmonary consolidation. There is no pleural effusion or pneumothorax. Low position of diaphragms suggests possible COPD. IMPRESSION: There are no signs of pulmonary edema or focal pulmonary consolidation. Electronically Signed   By: Elmer Picker M.D.   On: 04/08/2021 14:20    Procedures .Critical Care Performed by: Lucrezia Starch, MD Authorized by: Lucrezia Starch, MD   Critical care provider statement:    Critical care time (minutes):  37   Critical care was necessary to treat or prevent imminent or life-threatening deterioration of the following conditions: aenmia.   Critical care was time spent personally by me on the following activities:  Development of treatment plan with patient or surrogate, discussions with consultants, evaluation of patient's response to treatment, examination of patient, ordering and review of laboratory studies, ordering and review of radiographic studies, ordering and performing treatments and interventions, pulse oximetry, re-evaluation of patient's condition and review of old charts    Medications Ordered in  ED Medications  0.9 %  sodium chloride infusion (has no administration in time range)  lactated ringers bolus 1,000 mL (1,000 mLs Intravenous New Bag/Given 04/08/21 1316)    ED Course/ Medical Decision Making/ A&P                           Medical Decision Making  61 year old lady presents to ER after syncopal episode.  History obtained from patient, review of EMS report, review of past discharge summary, had admission for UTI, anemia requiring blood transfusion in January 2022.  Today, patient was initially noted to be borderline hypotensive, tachycardic.  Noted pale conjunctiva and pale skin.  Checked CT head and C-spine, negative per my review and per radiologist for any acute traumatic pathology, this was ordered due to the possible head trauma after the syncope.  Her lab work was concerning for significant anemia, 5.3, baseline 7.4 also noted some leukocytosis.  No fever and denies any specific infectious symptoms.  Based on her initial vital signs, provided 1 L fluid bolus, vitals improved.  Given her low hemoglobin, ordered 2 units of blood.  Recommended rectal exam and FOBT to rule out GI source, patient however denied any specific GI complaints and she refused the exam.  Will defer for now, instructed RN to collect stool sample if she does provide this.  Case discussed with Dr. Olevia Bowens who will admit for further management.         Final Clinical Impression(s) / ED Diagnoses Final diagnoses:  Near syncope  Syncope, unspecified syncope type  Anemia, unspecified type    Rx / DC Orders ED Discharge Orders     None         Lucrezia Starch, MD 04/08/21 1545

## 2021-04-08 NOTE — ED Provider Notes (Signed)
3:18 PM Care assumed from Dr. Stevie Kern.  At time of transfer of care, patient is awaiting for labs to return and then she will be admitted for symptomatic anemia leading to syncopal episode.  Patient is receiving 2 units of blood for the symptomatic anemia.  According to previous team, she refused rectal exam or fecal occult blood testing.  It is unclear the source of her anemia but she has had in the past.  She will be admitted after work-up is completed.   Noelle Sease, Canary Brim, MD 04/08/21 1623

## 2021-04-08 NOTE — ED Triage Notes (Signed)
Pt arrives via [facility] for syncopal episode earlier this morning. She states that she felt like she had vision changes and felt more weak on her R side before passing out. Initially on seen pt SBP noted to be 70. When EMS arrived pt was 80/50 and improved to 110/60 right before arriving to ED.

## 2021-04-08 NOTE — ED Notes (Signed)
Phlebotomy in to try to get blood, pt refused blood draw

## 2021-04-08 NOTE — ED Notes (Signed)
Unable to get 1400 blood that was due

## 2021-04-09 ENCOUNTER — Observation Stay (HOSPITAL_BASED_OUTPATIENT_CLINIC_OR_DEPARTMENT_OTHER): Payer: Self-pay

## 2021-04-09 ENCOUNTER — Other Ambulatory Visit: Payer: Self-pay

## 2021-04-09 DIAGNOSIS — R55 Syncope and collapse: Secondary | ICD-10-CM

## 2021-04-09 LAB — CBC
HCT: 23.9 % — ABNORMAL LOW (ref 36.0–46.0)
HCT: 23.5 % — ABNORMAL LOW (ref 36.0–46.0)
Hemoglobin: 7.9 g/dL — ABNORMAL LOW (ref 12.0–15.0)
Hemoglobin: 8.1 g/dL — ABNORMAL LOW (ref 12.0–15.0)
MCH: 21.9 pg — ABNORMAL LOW (ref 26.0–34.0)
MCH: 22.2 pg — ABNORMAL LOW (ref 26.0–34.0)
MCHC: 33.1 g/dL (ref 30.0–36.0)
MCHC: 34.5 g/dL (ref 30.0–36.0)
MCV: 66.4 fL — ABNORMAL LOW (ref 80.0–100.0)
MCV: 64.4 fL — ABNORMAL LOW (ref 80.0–100.0)
Platelets: 174 10*3/uL (ref 150–400)
Platelets: 181 10*3/uL (ref 150–400)
RBC: 3.6 MIL/uL — ABNORMAL LOW (ref 3.87–5.11)
RBC: 3.65 MIL/uL — ABNORMAL LOW (ref 3.87–5.11)
RDW: 27.1 % — ABNORMAL HIGH (ref 11.5–15.5)
RDW: 26.7 % — ABNORMAL HIGH (ref 11.5–15.5)
WBC: 6.1 10*3/uL (ref 4.0–10.5)
WBC: 5.7 10*3/uL (ref 4.0–10.5)
nRBC: 0 % (ref 0.0–0.2)
nRBC: 0 % (ref 0.0–0.2)

## 2021-04-09 LAB — COMPREHENSIVE METABOLIC PANEL
ALT: 8 U/L (ref 0–44)
AST: 12 U/L — ABNORMAL LOW (ref 15–41)
Albumin: 3 g/dL — ABNORMAL LOW (ref 3.5–5.0)
Alkaline Phosphatase: 55 U/L (ref 38–126)
Anion gap: 7 (ref 5–15)
BUN: 29 mg/dL — ABNORMAL HIGH (ref 8–23)
CO2: 21 mmol/L — ABNORMAL LOW (ref 22–32)
Calcium: 9.7 mg/dL (ref 8.9–10.3)
Chloride: 114 mmol/L — ABNORMAL HIGH (ref 98–111)
Creatinine, Ser: 1 mg/dL (ref 0.44–1.00)
GFR, Estimated: >60 mL/min (ref >60)
Glucose, Bld: 83 mg/dL (ref 70–99)
Potassium: 3.1 mmol/L — ABNORMAL LOW (ref 3.5–5.1)
Sodium: 142 mmol/L (ref 135–145)
Total Bilirubin: 1.2 mg/dL (ref 0.3–1.2)
Total Protein: 5.8 g/dL — ABNORMAL LOW (ref 6.5–8.1)

## 2021-04-09 LAB — FERRITIN: Ferritin: 9 ng/mL — ABNORMAL LOW (ref 11–307)

## 2021-04-09 LAB — ECHOCARDIOGRAM COMPLETE
AR max vel: 2.35 cm2
AV Peak grad: 5.5 mmHg
Ao pk vel: 1.17 m/s
Area-P 1/2: 8.52 cm2
Calc EF: 58.5 %
S' Lateral: 2.3 cm
Single Plane A2C EF: 60 %
Single Plane A4C EF: 57.4 %

## 2021-04-09 LAB — HIV ANTIBODY (ROUTINE TESTING W REFLEX): HIV Screen 4th Generation wRfx: NONREACTIVE

## 2021-04-09 LAB — MAGNESIUM: Magnesium: 1.7 mg/dL (ref 1.7–2.4)

## 2021-04-09 LAB — IRON AND TIBC
Iron: 203 ug/dL — ABNORMAL HIGH (ref 28–170)
Saturation Ratios: 90 % — ABNORMAL HIGH (ref 10.4–31.8)
TIBC: 226 ug/dL — ABNORMAL LOW (ref 250–450)
UIBC: 23 ug/dL

## 2021-04-09 LAB — VITAMIN B12: Vitamin B-12: 280 pg/mL (ref 180–914)

## 2021-04-09 LAB — FOLATE: Folate: 10.8 ng/mL (ref >5.9)

## 2021-04-09 MED ORDER — LACTATED RINGERS IV BOLUS
500.0000 mL | Freq: Once | INTRAVENOUS | Status: AC
  Administered 2021-04-09: 500 mL via INTRAVENOUS

## 2021-04-09 MED ORDER — FERROUS SULFATE 325 (65 FE) MG PO TABS
325.0000 mg | ORAL_TABLET | Freq: Two times a day (BID) | ORAL | 0 refills | Status: DC
  Filled 2021-04-09: qty 60, 30d supply, fill #0

## 2021-04-09 MED ORDER — PANTOPRAZOLE SODIUM 40 MG PO TBEC
40.0000 mg | DELAYED_RELEASE_TABLET | Freq: Every day | ORAL | 0 refills | Status: DC
  Filled 2021-04-09: qty 30, 30d supply, fill #0

## 2021-04-09 MED ORDER — FERROUS SULFATE 325 (65 FE) MG PO TABS
325.0000 mg | ORAL_TABLET | Freq: Two times a day (BID) | ORAL | Status: DC
  Administered 2021-04-09: 325 mg via ORAL
  Filled 2021-04-09: qty 1

## 2021-04-09 MED ORDER — B COMPLEX-C PO TABS
1.0000 | ORAL_TABLET | Freq: Every day | ORAL | 0 refills | Status: DC
  Filled 2021-04-09: qty 120, 120d supply, fill #0

## 2021-04-09 MED ORDER — POTASSIUM CHLORIDE CRYS ER 20 MEQ PO TBCR
40.0000 meq | EXTENDED_RELEASE_TABLET | Freq: Once | ORAL | Status: AC
  Administered 2021-04-09: 40 meq via ORAL
  Filled 2021-04-09: qty 2

## 2021-04-09 MED ORDER — B COMPLEX-C PO TABS
1.0000 | ORAL_TABLET | Freq: Every day | ORAL | Status: DC
  Administered 2021-04-09: 1 via ORAL
  Filled 2021-04-09: qty 1

## 2021-04-09 MED ORDER — MIRTAZAPINE 30 MG PO TABS
30.0000 mg | ORAL_TABLET | Freq: Every day | ORAL | 0 refills | Status: DC
  Filled 2021-04-09: qty 30, 30d supply, fill #0

## 2021-04-09 NOTE — Discharge Summary (Signed)
Physician Discharge Summary   Patient: Nicole Cunningham MRN: [id number] DOB: Aug 10, 1960  Admit date:     04/08/2021  Discharge date: 04/09/21  Discharge Physician: Berle Mull   PCP: [name], MD   Recommendations at discharge:   Follow-up with PCP in 1 week. Establish care with a gastroenterologist for endoscopic evaluation most likely patient will benefit from both upper and lower GI endoscopy. Repeat CBC in 1 week. Discussed with PCP and psychiatry with regards to failure to thrive  Discharge Diagnoses Principal Problem:   Symptomatic anemia Active Problems:   MDD (major depressive disorder), recurrent, in full remission ([place])   COPD with chronic bronchitis (HCC)   AKI (acute kidney injury) ([place])   Essential hypertension   Hypercalcemia   Syncope  Resolved Problems:   * No resolved hospital problems. [facility] Course   61 year old female with past medical history of COPD, HTN, depression, chronic anemia of iron deficiency.  Presented with complaints of a syncopal event at home Patient initially had some lightheadedness and vision changes and then passed out.  No head or neck injury. At the time of my evaluation did not have any reoccurrence of dizziness or lightheadedness or any other acute complaints overnight. Found to have severe anemia SP blood transfusion x2 units with stable hemoglobin following that. Patient has not been participating in care, not allowing FOBT or rectal exam. Syncope Most likely in the setting of hypotension from taking blood pressure medication, dehydration likely from poor p.o. intake as well as symptomatic anemia. After transfusion blood pressure has stabilized, renal function has normalized.  Patient does not appear dehydrated anymore. Telemetry negative for any acute events. No focal deficit on exam. Echocardiogram negative for any valvular abnormality and EF was normal. Recommended outpatient follow-up with PCP. Recommend  discontinuing hypertensive medications.  Essential hypertension. Patient is on amlodipine and losartan. Blood pressure was initially low and patient presented with a syncopal event. Currently with without the medications the blood pressure remains normal. Patient has lost significant weight over the last 2 years. In July 21 weight was 140 pounds.,  Currently her reported weight is 90 pounds. Thus she probably does not require aggressive hypertensive regimen. Will hold both medications on discharge and recommend outpatient follow-up with PCP.  Symptomatic anemia. Microcytic.  Suspect chronic GI loss. Difficult interpret etiology. In January 2022 patient presented with similar low hemoglobin and transfused PRBC. Currently patient transfused 2 PRBC with hemoglobin improvement to 8. Repeat hemoglobin remained stable. Reticulocyte count not elevated thus less likely acute hemorrhage or hemolysis. Iron level elevated although suspect this is after transfusion therefore not reliable. Folic acid low [id number] relatively low. Will initiate supplements. With her weight loss suspect patient is suffering from failure to thrive and nutritional deficiency which is causing the anemia.  Adult failure to thrive. Patient had significant weight loss over last 2 years. Patient most likely suffering from severe psychiatric illness which is contributing to her failure to thrive. Currently patient is not exhibiting any suicidal homicidal ideation. Recommend close follow-up with psychiatry outpatient.  Acute kidney injury. From poor p.o. intake. Treated with IV fluids and PRBC transfusion. Currently normalized.  Consultants: None Procedures performed: PRBC transfusion x2 Disposition: Home Diet recommendation: Regular diet  DISCHARGE MEDICATION: Allergies as of 04/09/2021   No Known Allergies      Medication List     STOP taking these medications    amLODipine 10 MG tablet Commonly known as:  NORVASC   cephALEXin 500 MG capsule  Commonly known as: KEFLEX   losartan 100 MG tablet Commonly known as: COZAAR   naproxen 500 MG tablet Commonly known as: NAPROSYN   predniSONE 20 MG tablet Commonly known as: DELTASONE       TAKE these medications    albuterol 108 (90 Base) MCG/ACT inhaler Commonly known as: VENTOLIN HFA Inhale 1-2 puffs into the lungs every 6 (six) hours as needed for wheezing or shortness of breath.   B-complex with vitamin C tablet Take 1 tablet by mouth daily. Start taking on: April 10, 2021   ferrous sulfate 325 (65 FE) MG tablet Take 1 tablet (325 mg total) by mouth 2 (two) times daily with a meal.   FLUoxetine 40 MG capsule Commonly known as: PROzac Take 2 capsules (80 mg total) by mouth daily.   fluticasone furoate-vilanterol 200-25 MCG/INH Aepb Commonly known as: BREO ELLIPTA Inhale 1 puff into the lungs daily.   gabapentin 400 MG capsule Commonly known as: NEURONTIN Take 1 capsule (400 mg total) by mouth at bedtime.   HYDROcodone-acetaminophen 5-325 MG tablet Commonly known as: NORCO/VICODIN Take 1 tablet by mouth every 4 (four) hours as needed. What changed: reasons to take this   lidocaine 5 % Commonly known as: Lidoderm Place 1 patch onto the skin daily. Remove & Discard patch within 12 hours or as directed by MD What changed:  when to take this reasons to take this   magnesium oxide 400 (241.3 Mg) MG tablet Commonly known as: MAG-OX TAKE 1 TABLET BY MOUTH 2 TIMES DAILY What changed: when to take this   mirtazapine 30 MG tablet Commonly known as: REMERON Take 1 tablet (30 mg total) by mouth at bedtime.   pantoprazole 40 MG tablet Commonly known as: PROTONIX Take 1 tablet (40 mg total) by mouth daily.   QUEtiapine 300 MG tablet Commonly known as: SEROQUEL Take 1 tablet (300 mg total) by mouth at bedtime. Take 2 tablets at bedtime        Follow-up Information     [name], MD. Schedule an appointment as  soon as possible for a visit in 1 week(s).   Specialty: Family Medicine Contact information: [address] [place] [address] [phone]                 Discharge Exam: Vitals:   04/09/21 0938 04/09/21 1103 04/09/21 1208 04/09/21 1441  BP: 116/77 116/77  125/75  Pulse: 91 90  96  Resp: 16 16  17   Temp: 97.8 F (36.6 C) 98.1 F (36.7 C)  97.7 F (36.5 C)  TempSrc: Oral Oral  Oral  SpO2: 100% 100% 100% 100%    General: Appear in mild distress, no Rash; Oral Mucosa Clear, moist. no Abnormal Neck Mass Or lumps, Conjunctiva normal  Cardiovascular: S1 and S2 Present, no Murmur, Respiratory: good respiratory effort, Bilateral Air entry present and CTA, no Crackles, no wheezes Abdomen: Bowel Sound present, Soft and no tenderness Extremities: no Pedal edema Neurology: alert and oriented to time, place, and person affect appropriate. no new focal deficit Gait not checked due to patient safety concerns   Condition at discharge: good  The results of significant diagnostics from this hospitalization (including imaging, microbiology, ancillary and laboratory) are listed below for reference.   Imaging Studies: DG Ribs Unilateral W/Chest Right  Result Date: 03/14/2021 CLINICAL DATA:  Recent slip and fall with back pain on the right, initial encounter EXAM: RIGHT RIBS AND CHEST - 3+ VIEW COMPARISON:  01/05/2021 FINDINGS: Cardiac shadow  is within normal limits. The lungs are well aerated bilaterally. Biapical pleural and parenchymal scarring is seen. No pneumothorax is noted. No definitive rib fracture is noted. IMPRESSION: No definitive rib fracture is noted. Electronically Signed   By: Inez Catalina M.D.   On: 03/14/2021 19:54   DG Lumbar Spine Complete  Result Date: 03/14/2021 CLINICAL DATA:  Lumbar pain, recent fall EXAM: LUMBAR SPINE - COMPLETE 4+ VIEW COMPARISON:  None. FINDINGS: Vertebral body heights are maintained. No acute fracture. Lower lumbar disc space  narrowing and small endplate osteophytes. Stool is present throughout the colon. IMPRESSION: No acute fracture. Electronically Signed   By: Macy Mis M.D.   On: 03/14/2021 14:36   CT Head Wo Contrast  Result Date: 04/08/2021 CLINICAL DATA:  61 year old female with history of trauma from a fall during a syncopal event. Head and neck pain. EXAM: CT HEAD WITHOUT CONTRAST CT CERVICAL SPINE WITHOUT CONTRAST TECHNIQUE: Multidetector CT imaging of the head and cervical spine was performed following the standard protocol without intravenous contrast. Multiplanar CT image reconstructions of the cervical spine were also generated. RADIATION DOSE REDUCTION: This exam was performed according to the departmental dose-optimization program which includes automated exposure control, adjustment of the mA and/or kV according to patient size and/or use of iterative reconstruction technique. COMPARISON:  No priors. FINDINGS: CT HEAD FINDINGS Brain: Physiologic calcifications are noted in the basal ganglia bilaterally. No evidence of acute infarction, hemorrhage, hydrocephalus, extra-axial collection or mass lesion/mass effect. Vascular: No hyperdense vessel or unexpected calcification. Skull: Normal. Negative for fracture or focal lesion. Sinuses/Orbits: No acute finding. Other: None. CT CERVICAL SPINE FINDINGS Alignment: Normal. Skull base and vertebrae: No acute fracture. No primary bone lesion or focal pathologic process. Soft tissues and spinal canal: No prevertebral fluid or swelling. No visible canal hematoma. Disc levels: Multilevel degenerative disc disease, most pronounced at C5-C6. Mild multilevel facet arthropathy. Upper chest: Pleuroparenchymal scarring in the apices of both lungs (right greater than left). Other: None. IMPRESSION: 1. No evidence of significant acute traumatic injury to the skull, brain or cervical spine. The appearance of the brain is normal. 2. Mild multilevel degenerative disc disease and  cervical spondylosis, as above. Electronically Signed   By: Vinnie Langton M.D.   On: 04/08/2021 13:54   CT Cervical Spine Wo Contrast  Result Date: 04/08/2021 CLINICAL DATA:  61 year old female with history of trauma from a fall during a syncopal event. Head and neck pain. EXAM: CT HEAD WITHOUT CONTRAST CT CERVICAL SPINE WITHOUT CONTRAST TECHNIQUE: Multidetector CT imaging of the head and cervical spine was performed following the standard protocol without intravenous contrast. Multiplanar CT image reconstructions of the cervical spine were also generated. RADIATION DOSE REDUCTION: This exam was performed according to the departmental dose-optimization program which includes automated exposure control, adjustment of the mA and/or kV according to patient size and/or use of iterative reconstruction technique. COMPARISON:  No priors. FINDINGS: CT HEAD FINDINGS Brain: Physiologic calcifications are noted in the basal ganglia bilaterally. No evidence of acute infarction, hemorrhage, hydrocephalus, extra-axial collection or mass lesion/mass effect. Vascular: No hyperdense vessel or unexpected calcification. Skull: Normal. Negative for fracture or focal lesion. Sinuses/Orbits: No acute finding. Other: None. CT CERVICAL SPINE FINDINGS Alignment: Normal. Skull base and vertebrae: No acute fracture. No primary bone lesion or focal pathologic process. Soft tissues and spinal canal: No prevertebral fluid or swelling. No visible canal hematoma. Disc levels: Multilevel degenerative disc disease, most pronounced at C5-C6. Mild multilevel facet arthropathy. Upper chest: Pleuroparenchymal scarring in the  apices of both lungs (right greater than left). Other: None. IMPRESSION: 1. No evidence of significant acute traumatic injury to the skull, brain or cervical spine. The appearance of the brain is normal. 2. Mild multilevel degenerative disc disease and cervical spondylosis, as above. Electronically Signed   By: Vinnie Langton M.D.   On: 04/08/2021 13:54   CT T-SPINE NO CHARGE  Result Date: 03/14/2021 CLINICAL DATA:  Back injury, slipped getting out of bathtub yesterday. EXAM: CT THORACIC SPINE WITHOUT CONTRAST TECHNIQUE: Multidetector CT images of the thoracic were obtained using the standard protocol without intravenous contrast. COMPARISON:  None. FINDINGS: Alignment: Normal. Vertebrae: No acute fracture or focal pathologic process. Paraspinal and other soft tissues: Negative. Examination of the soft tissues is limited due to field of view Disc levels: No significant spinal canal stenosis. Other: A 1.4 cm calculus is noted in the left kidney. There is mild atherosclerotic calcification of the aorta. A 6 mm pulmonary nodule is noted in the right lower lobe. There is a nodule in the left lower lobe measuring 5 mm. IMPRESSION: 1. No acute fracture. 2. Bilateral lower lobe pulmonary nodules. The nodule in the right is unchanged from 04/18/2020. Non-contrast chest CT at 3-6 months is recommended. If the nodules are stable at time of repeat CT, then future CT at 18-24 months (from today's scan) is considered optional for low-risk patients, but is recommended for high-risk patients. This recommendation follows the consensus statement: Guidelines for Management of Incidental Pulmonary Nodules Detected on CT Images: From the Fleischner Society 2017; Radiology 2017; 284:228-243. 3. Left renal calculus. Electronically Signed   By: Brett Fairy M.D.   On: 03/14/2021 22:38   CT L-SPINE NO CHARGE  Result Date: 03/14/2021 CLINICAL DATA:  Initial evaluation for acute injury, fall. EXAM: CT LUMBAR SPINE WITHOUT CONTRAST TECHNIQUE: Multidetector CT imaging of the lumbar spine was performed without intravenous contrast administration. Multiplanar CT image reconstructions were also generated. COMPARISON:  Radiograph from earlier the same day. FINDINGS: Segmentation: Standard. Lowest well-formed disc space labeled the L5-S1 level.  Alignment: Physiologic with preservation of the normal lumbar lordosis. No listhesis. Vertebrae: Vertebral body height maintained without acute or chronic fracture. Visualized sacrum and pelvis intact. SI joints symmetric and within normal limits. No discrete or worrisome osseous lesions. Paraspinal and other soft tissues: Paraspinous soft tissues demonstrate no acute finding. 1.4 cm nonobstructive left renal nephrolithiasis. Fibroid uterus partially visualized. Disc levels: Mild for age degenerative spondylosis, most pronounced at L4-5. No significant spinal stenosis. Mild lower lumbar facet hypertrophy. Foramina appear patent. IMPRESSION: 1. No acute traumatic injury within the lumbar spine. 2. Mild for age degenerative spondylosis, most pronounced at L4-5. No significant stenosis. 3. 1.4 cm nonobstructive left renal nephrolithiasis. 4. Fibroid uterus. Electronically Signed   By: Jeannine Boga M.D.   On: 03/14/2021 23:10   DG Chest Portable 1 View  Result Date: 04/08/2021 CLINICAL DATA:  Shortness of breath EXAM: PORTABLE CHEST 1 VIEW COMPARISON:  03/14/2021 FINDINGS: Cardiac size is within normal limits. There are no signs of pulmonary edema or focal pulmonary consolidation. There is no pleural effusion or pneumothorax. Low position of diaphragms suggests possible COPD. IMPRESSION: There are no signs of pulmonary edema or focal pulmonary consolidation. Electronically Signed   By: Elmer Picker M.D.   On: 04/08/2021 14:20   ECHOCARDIOGRAM COMPLETE  Result Date: 04/09/2021    ECHOCARDIOGRAM REPORT   Patient Name:   JAHNYLA MOOREHOUSE Date of Exam: 04/09/2021 Medical Rec #:  [id number]  Height:       64.0 in Accession #:    [id number]       Weight:       90.0 lb Date of Birth:  [birth year]        BSA:          1.393 m Patient Age:    61 years         BP:           131/83 mmHg Patient Gender: F                HR:           95 bpm. Exam Location:  Inpatient Procedure: 2D Echo, Color Doppler and  Cardiac Doppler Indications:    Syncope  History:        Patient has no prior history of Echocardiogram examinations.                 COPD; Risk Factors:Hypertension.  Sonographer:    Jyl Heinz Referring Phys: [id number] DAVID MANUEL Vian  1. Left ventricular ejection fraction, by estimation, is 55 to 60%. The left ventricle has normal function. The left ventricle has no regional wall motion abnormalities. Indeterminate diastolic filling due to E-A fusion.  2. Right ventricular systolic function is normal. The right ventricular size is normal. Tricuspid regurgitation signal is inadequate for assessing PA pressure.  3. The mitral valve is grossly normal. No evidence of mitral valve regurgitation.  4. The aortic valve is tricuspid. There is mild thickening of the aortic valve. Aortic valve regurgitation is not visualized. No aortic stenosis is present.  5. The inferior vena cava is normal in size with greater than 50% respiratory variability, suggesting right atrial pressure of 3 mmHg. Comparison(s): No prior Echocardiogram. FINDINGS  Left Ventricle: Left ventricular ejection fraction, by estimation, is 55 to 60%. The left ventricle has normal function. The left ventricle has no regional wall motion abnormalities. The left ventricular internal cavity size was small. There is no left ventricular hypertrophy. Indeterminate diastolic filling due to E-A fusion. Right Ventricle: The right ventricular size is normal. No increase in right ventricular wall thickness. Right ventricular systolic function is normal. Tricuspid regurgitation signal is inadequate for assessing PA pressure. Left Atrium: Left atrial size was normal in size. Right Atrium: Right atrial size was normal in size. Pericardium: Trivial pericardial effusion is present. Mitral Valve: The mitral valve is grossly normal. No evidence of mitral valve regurgitation. Tricuspid Valve: The tricuspid valve is normal in structure. Tricuspid valve  regurgitation is not demonstrated. No evidence of tricuspid stenosis. Aortic Valve: The aortic valve is tricuspid. There is mild thickening of the aortic valve. Aortic valve regurgitation is not visualized. No aortic stenosis is present. Aortic valve peak gradient measures 5.5 mmHg. Pulmonic Valve: The pulmonic valve was grossly normal. Pulmonic valve regurgitation is not visualized. No evidence of pulmonic stenosis. Aorta: The aortic root and ascending aorta are structurally normal, with no evidence of dilitation. Venous: The inferior vena cava is normal in size with greater than 50% respiratory variability, suggesting right atrial pressure of 3 mmHg. IAS/Shunts: No atrial level shunt detected by color flow Doppler.  LEFT VENTRICLE PLAX 2D LVIDd:         3.10 cm     Diastology LVIDs:         2.30 cm     LV e' medial:    8.59 cm/s LV PW:         0.90 cm  LV E/e' medial:  8.9 LV IVS:        0.70 cm     LV e' lateral:   9.46 cm/s LVOT diam:     1.80 cm     LV E/e' lateral: 8.1 LV SV:         54 LV SV Index:   39 LVOT Area:     2.54 cm  LV Volumes (MOD) LV vol d, MOD A2C: 58.7 ml LV vol d, MOD A4C: 60.3 ml LV vol s, MOD A2C: 23.5 ml LV vol s, MOD A4C: 25.7 ml LV SV MOD A2C:     35.2 ml LV SV MOD A4C:     60.3 ml LV SV MOD BP:      34.8 ml RIGHT VENTRICLE             IVC RV Basal diam:  2.10 cm     IVC diam: 1.70 cm RV Mid diam:    2.10 cm RV S prime:     13.80 cm/s TAPSE (M-mode): 2.0 cm LEFT ATRIUM             Index        RIGHT ATRIUM          Index LA diam:        2.90 cm 2.08 cm/m   RA Area:     8.64 cm LA Vol (A2C):   25.2 ml 18.09 ml/m  RA Volume:   15.90 ml 11.41 ml/m LA Vol (A4C):   29.9 ml 21.46 ml/m LA Biplane Vol: 30.2 ml 21.67 ml/m  AORTIC VALVE AV Area (Vmax): 2.35 cm AV Vmax:        117.00 cm/s AV Peak Grad:   5.5 mmHg LVOT Vmax:      108.00 cm/s LVOT Vmean:     74.600 cm/s LVOT VTI:       0.212 m  AORTA Ao Root diam: 2.60 cm Ao Asc diam:  2.50 cm MITRAL VALVE                TRICUSPID VALVE MV  Area (PHT): 8.52 cm     TR Peak grad:   17.6 mmHg MV Decel Time: 89 msec      TR Vmax:        210.00 cm/s MV E velocity: 76.50 cm/s MV A velocity: 101.00 cm/s  SHUNTS MV E/A ratio:  0.76         Systemic VTI:  0.21 m                             Systemic Diam: 1.80 cm Rudean Haskell MD Electronically signed by Rudean Haskell MD Signature Date/Time: 04/09/2021/1:03:59 PM    Final    CT CHEST ABDOMEN PELVIS WO CONTRAST  Result Date: 03/14/2021 CLINICAL DATA:  Polytrauma, blunt fall yesterday. Diffuse right rib pain. EXAM: CT CHEST, ABDOMEN AND PELVIS WITHOUT CONTRAST TECHNIQUE: Multidetector CT imaging of the chest, abdomen and pelvis was performed following the standard protocol without IV contrast. COMPARISON:  04/18/2020. FINDINGS: CT CHEST FINDINGS Cardiovascular: The heart is normal in size and there is a trace pericardial effusion. There is atherosclerotic calcification of the aorta without evidence of aneurysm. The pulmonary trunk is normal in caliber. Mediastinum/Nodes: No mediastinal or axillary lymphadenopathy. Evaluation of the hila is limited due to lack of IV contrast. The thyroid gland, trachea, and esophagus are within normal limits. There is a small hiatal hernia. Lungs/Pleura: Apical  pleural thickening and scarring is noted bilaterally. Patchy parenchymal opacities are noted in the posterior aspect of the right lower lobe. A 6 mm pulmonary nodule is noted in the right lower lobe, axial image 82. There is a 5 mm nodule in the left lower lobe, axial image 71. No effusion or pneumothorax. Musculoskeletal: No acute fracture is identified. No chest wall hematoma. CT ABDOMEN PELVIS FINDINGS Hepatobiliary: No focal liver abnormality is seen. No biliary ductal dilatation. Hyperdense material and a stone are present in the gallbladder, not significant changed on the prior exam. Pancreas: Unremarkable. No pancreatic ductal dilatation or surrounding inflammatory changes. Spleen: No splenic injury  or perisplenic hematoma. Adrenals/Urinary Tract: No adrenal nodule or mass. There is a 1.4 cm calcification in the mid left kidney. Punctate calcification is present in the mid right kidney. No hydronephrosis is seen bilaterally. No bladder wall thickening. A focus of air is present in the nondependent portion of the urinary bladder which may be iatrogenic. Stomach/Bowel: No bowel obstruction, free air or pneumatosis. A moderate to large amount of retained stool is present in the colon. The appendix is normal in caliber. No focal bowel wall thickening. Vascular/Lymphatic: Aortic atherosclerosis. No enlarged abdominal or pelvic lymph nodes. Reproductive: The uterus is enlarged and contains multiple calcified and noncalcified fibroids. No definite adnexal mass. Other: No free fluid. Musculoskeletal: No acute fracture. Mild degenerative changes in the lumbar spine. IMPRESSION: 1. No evidence of acute fracture or solid organ injury. 2. Parenchymal opacities along the posterior aspect of the right lower lobe, possibly representing atelectasis or scarring. The possibility of pulmonary torsion can not be excluded given history of trauma. 3. Bilateral pulmonary nodules. Non-contrast chest CT at 3-6 months is recommended. If the nodules are stable at time of repeat CT, then future CT at 18-24 months (from today's scan) is considered optional for low-risk patients, but is recommended for high-risk patients. This recommendation follows the consensus statement: Guidelines for Management of Incidental Pulmonary Nodules Detected on CT Images: From the Fleischner Society 2017; Radiology 2017; 284:228-243. 4. Cholelithiasis with hyperdense material in the gallbladder, unchanged from the prior exam. Consider ultrasound for further evaluation on follow-up. 5. Bilateral renal calculi. 6. Fibroid uterus. Electronically Signed   By: Brett Fairy M.D.   On: 03/14/2021 23:33    Microbiology: Results for orders placed or performed  during the hospital encounter of 04/08/21  Blood culture (routine x 2)     Status: None (Preliminary result)   Collection Time: 04/08/21  1:13 PM   Specimen: BLOOD  Result Value Ref Range Status   Specimen Description   Final    BLOOD BLOOD RIGHT FOREARM Performed at [facility] [address] [place], [place] [address]    Special Requests   Final    BOTTLES DRAWN AEROBIC AND ANAEROBIC Blood Culture adequate volume Performed at [facility] [address] [place], [place] [address]    Culture   Final    NO GROWTH < 24 HOURS Performed at [facility] [address] [place], [place] [address]    Report Status PENDING  Incomplete  Blood culture (routine x 2)     Status: None (Preliminary result)   Collection Time: 04/08/21  2:07 PM   Specimen: BLOOD  Result Value Ref Range Status   Specimen Description   Final    BLOOD LEFT ANTECUBITAL Performed at [facility] [address], [place],  [address]    Special Requests  Final    BOTTLES DRAWN AEROBIC AND ANAEROBIC Blood Culture adequate volume Performed at [facility] [address] [place], [place] [address]    Culture   Final    NO GROWTH < 24 HOURS Performed at [facility] [address] [place], [place] [address]    Report Status PENDING  Incomplete  Resp Panel by RT-PCR (Flu A&B, Covid) Nasopharyngeal Swab     Status: None   Collection Time: 04/08/21  9:44 PM   Specimen: Nasopharyngeal Swab; Nasopharyngeal(NP) swabs in vial transport medium  Result Value Ref Range Status   SARS Coronavirus 2 by RT PCR NEGATIVE NEGATIVE Final    Comment: (NOTE) SARS-CoV-2 target nucleic acids are NOT DETECTED.  The SARS-CoV-2 RNA is generally detectable in upper respiratory specimens during the acute phase of infection. The lowest concentration of SARS-CoV-2 viral copies this assay can detect is 138 copies/mL. A negative result  does not preclude SARS-Cov-2 infection and should not be used as the sole basis for treatment or other patient management decisions. A negative result may occur with  improper specimen collection/handling, submission of specimen other than nasopharyngeal swab, presence of viral mutation(s) within the areas targeted by this assay, and inadequate number of viral copies(<138 copies/mL). A negative result must be combined with clinical observations, patient history, and epidemiological information. The expected result is Negative.  Fact Sheet for Patients:  EntrepreneurPulse.com.au  Fact Sheet for Healthcare Providers:  IncredibleEmployment.be  This test is no t yet approved or cleared by the [place] FDA and  has been authorized for detection and/or diagnosis of SARS-CoV-2 by FDA under an Emergency Use Authorization (EUA). This EUA will remain  in effect (meaning this test can be used) for the duration of the COVID-19 declaration under Section 564(b)(1) of the Act, 21 U.S.C.section 360bbb-3(b)(1), unless the authorization is terminated  or revoked sooner.       Influenza A by PCR NEGATIVE NEGATIVE Final   Influenza B by PCR NEGATIVE NEGATIVE Final    Comment: (NOTE) The Xpert Xpress SARS-CoV-2/FLU/RSV plus assay is intended as an aid in the diagnosis of influenza from Nasopharyngeal swab specimens and should not be used as a sole basis for treatment. Nasal washings and aspirates are unacceptable for Xpert Xpress SARS-CoV-2/FLU/RSV testing.  Fact Sheet for Patients: EntrepreneurPulse.com.au  Fact Sheet for Healthcare Providers: IncredibleEmployment.be  This test is not yet approved or cleared by the [place] FDA and has been authorized for detection and/or diagnosis of SARS-CoV-2 by FDA under an Emergency Use Authorization (EUA). This EUA will remain in effect (meaning this test can be used) for  the duration of the COVID-19 declaration under Section 564(b)(1) of the Act, 21 U.S.C. section 360bbb-3(b)(1), unless the authorization is terminated or revoked.  Performed at [facility], [place] [address] [place], [place] [address]     Labs: CBC: Recent Labs  Lab 04/08/21 1313 04/09/21 0358 04/09/21 1122  WBC 14.2* 6.1 5.7  NEUTROABS 11.6*  --   --   HGB 5.3* 7.9* 8.1*  HCT 16.9* 23.9* 23.5*  MCV 57.7* 66.4* 64.4*  PLT 330 174 0000000   Basic Metabolic Panel: Recent Labs  Lab 04/08/21 1313 04/09/21 0358  NA 139 142  K 3.3* 3.1*  CL 110 114*  CO2 19* 21*  GLUCOSE 139* 83  BUN 43* 29*  CREATININE 1.50* 1.00  CALCIUM 10.7* 9.7  MG  --  1.7   Liver Function Tests: Recent Labs  Lab 04/08/21 1313  04/09/21 0358  AST 15 12*  ALT 8 8  ALKPHOS 66 55  BILITOT 0.4 1.2  PROT 6.7 5.8*  ALBUMIN 3.4* 3.0*   CBG: No results for input(s): GLUCAP in the last 168 hours.  Discharge time spent: greater than 30 minutes.  Signed: Berle Mull, MD [facility] 04/09/2021

## 2021-04-09 NOTE — Plan of Care (Signed)

## 2021-04-09 NOTE — Progress Notes (Signed)
AVS and discharge instructions reviewed w/ patient. Patient verbalized understanding and had no further questions. Patient was sent home w/ cab voucher via [facility].

## 2021-04-09 NOTE — TOC CM/SW Note (Signed)
Care giving concerns:    Husband reported he is unable to transport patient, and no other means of transportation available.  Cab voucher provided to nurse.  No additional social work needs identified at this time.  CSW will sign off.

## 2021-04-10 ENCOUNTER — Other Ambulatory Visit: Payer: Self-pay

## 2021-04-10 ENCOUNTER — Telehealth: Payer: Self-pay

## 2021-04-10 LAB — BPAM RBC
Blood Product Expiration Date: 202302022359
Blood Product Expiration Date: 202302022359
ISSUE DATE / TIME: 202301141654
ISSUE DATE / TIME: 202301142056
Unit Type and Rh: 6200
Unit Type and Rh: 6200

## 2021-04-10 LAB — TYPE AND SCREEN
ABO/RH(D): AB POS
Antibody Screen: NEGATIVE
Unit division: 0
Unit division: 0

## 2021-04-10 NOTE — Telephone Encounter (Signed)
Transition Care Management Follow-up Telephone Call Date of discharge and from where: 04/09/2021, [facility] How have you been since you were released from the hospital? She said she is feeling a little better, just a slight headache.  Any questions or concerns? No  Items Reviewed: Did the pt receive and understand the discharge instructions provided? Yes  Medications obtained and verified? Yes  - she said she has all of her medications and did not have any questions about her med regime. She said she understood the changes that have been made with the meds  Other? No  Any new allergies since your discharge? No  Dietary orders reviewed? Yes Do you have support at home? Yes  - her husband and her son.  Home Care and Equipment/Supplies: Were home health services ordered? no If so, what is the name of the agency? N/a  Has the agency set up a time to come to the patient's home? no Were any new equipment or medical supplies ordered?  No What is the name of the medical supply agency? N/a Were you able to get the supplies/equipment? not applicable Do you have any questions related to the use of the equipment or supplies? No  Functional Questionnaire: (I = Independent and D = Dependent) ADLs: She said she is independent. Has a walker to use with ambulation but she left that at her parents' house. Her husband is available to provide assistance if needed.     Follow up appointments reviewed:  PCP Hospital f/u appt confirmed? Yes  Scheduled to see Dr Redmond Pulling on 04/19/2021  Specialist Hospital f/u appt confirmed?  None scheduled at this time.    Are transportation arrangements needed? Yes  - ride arranged to her appointment with Dr Redmond Pulling 04/19/2021. [employer] ID [id number]. Patient informed of pick up time: 0952. Her address has been confirmed  If their condition worsens, is the pt aware to call PCP or go to the Emergency Dept.? Yes Was the patient provided with contact information for the  PCP's office or ED? Yes Was to pt encouraged to call back with questions or concerns? Yes

## 2021-04-13 LAB — CULTURE, BLOOD (ROUTINE X 2)
Culture: NO GROWTH
Culture: NO GROWTH
Special Requests: ADEQUATE
Special Requests: ADEQUATE

## 2021-04-19 ENCOUNTER — Ambulatory Visit: Payer: Self-pay | Admitting: Family Medicine

## 2021-04-23 ENCOUNTER — Encounter (HOSPITAL_COMMUNITY): Payer: Self-pay | Admitting: Physician Assistant

## 2021-05-18 ENCOUNTER — Encounter (HOSPITAL_COMMUNITY): Payer: Self-pay | Admitting: Physician Assistant

## 2021-05-18 ENCOUNTER — Telehealth (INDEPENDENT_AMBULATORY_CARE_PROVIDER_SITE_OTHER): Payer: No Payment, Other | Admitting: Physician Assistant

## 2021-05-18 DIAGNOSIS — F419 Anxiety disorder, unspecified: Secondary | ICD-10-CM | POA: Diagnosis not present

## 2021-05-18 DIAGNOSIS — F3341 Major depressive disorder, recurrent, in partial remission: Secondary | ICD-10-CM | POA: Diagnosis not present

## 2021-05-18 MED ORDER — FLUOXETINE HCL 40 MG PO CAPS: 80.0000 mg | ORAL_CAPSULE | Freq: Every day | ORAL | 2 refills | Status: DC

## 2021-05-18 MED ORDER — GABAPENTIN 400 MG PO CAPS: 400.0000 mg | ORAL_CAPSULE | Freq: Every day | ORAL | 2 refills | Status: DC

## 2021-05-18 MED ORDER — QUETIAPINE FUMARATE 300 MG PO TABS: 300.0000 mg | ORAL_TABLET | Freq: Every day | ORAL | 2 refills | Status: DC

## 2021-05-18 MED ORDER — MIRTAZAPINE 30 MG PO TABS: 30.0000 mg | ORAL_TABLET | Freq: Every day | ORAL | 2 refills | Status: DC

## 2021-05-18 NOTE — Progress Notes (Signed)
BH MD/PA/NP OP Progress Note  Virtual Visit via Telephone Note  I connected with Nicole Cunningham on 05/18/21 at  2:30 PM EST by telephone and verified that I am speaking with the correct person using two identifiers.  Location: Patient: Home Provider: Clinic   I discussed the limitations, risks, security and privacy concerns of performing an evaluation and management service by telephone and the availability of in person appointments. I also discussed with the patient that there may be a patient responsible charge related to this service. The patient expressed understanding and agreed to proceed.  Follow Up Instructions:  I discussed the assessment and treatment plan with the patient. The patient was provided an opportunity to ask questions and all were answered. The patient agreed with the plan and demonstrated an understanding of the instructions.   The patient was advised to call back or seek an in-person evaluation if the symptoms worsen or if the condition fails to improve as anticipated.  I provided 7 minutes of non-face-to-face time during this encounter.  Malachy Mood, PA   05/18/2021 6:43 PM Nicole Cunningham  MRN:  [id number]  Chief Complaint:  Chief Complaint  Patient presents with   Follow-up   Medication Management   HPI:   Nicole Cunningham is a 61 year old female with a past psychiatric history significant for major depressive disorder and anxiety who presents to [facility] via virtual telephone visit for follow-up and medication management.  Patient is currently being managed on the following medications:  Seroquel 300 mg 2 tablets at bedtime Gabapentin 400 mg at bedtime Mirtazapine 30 mg at bedtime Fluoxetine 80 mg daily  Patient reports no issues or concerns regarding her current medication regimen.  Patient denies the need for dosage adjustments at this time and is requesting refills on all her medications  following the conclusion of the encounter.  Patient is continuing to take her medications as prescribed and appears to be stable.  Patient denies any major depressive symptoms nor does she endorse anxiety.  Patient endorses some stressors regarding "different things" but does not elaborate on what stressing her out.  Patient denies any other issues regarding her mental health.  A GAD-7 screen was performed with the patient scoring a 17.  Patient is alert and oriented x4, calm, cooperative, and fully engaged in conversation during the encounter.  Patient endorses calm mood.  Patient denies suicidal or homicidal ideations.  She further denies auditory or visual hallucinations and does not appear to be responding to internal/external stimuli.  Patient endorses receiving a good amount of sleep.  Patient endorses good appetite and states that she is eating every day.  Patient denies alcohol consumption, tobacco use, and illicit drug use.  Visit Diagnosis:    ICD-10-CM   1. MDD (major depressive disorder), recurrent, in partial remission (HCC)  F33.41 FLUoxetine (PROZAC) 40 MG capsule    gabapentin (NEURONTIN) 400 MG capsule    QUEtiapine (SEROQUEL) 300 MG tablet    mirtazapine (REMERON) 30 MG tablet    2. Anxiety disorder, unspecified type  F41.9 gabapentin (NEURONTIN) 400 MG capsule      Past Psychiatric History:  Major depressive disorder Anxiety disorder  Past Medical History:  Past Medical History:  Diagnosis Date   Bronchitis    COPD (chronic obstructive pulmonary disease) ([place])    Hypertension    History reviewed. No pertinent surgical history.  Family Psychiatric History:  Denied  Family History:  Family History  Problem Relation  Age of Onset   Diabetes Mother    Diabetes Father     Social History:  Social History   Socioeconomic History   Marital status: Married    Spouse name: Not on file   Number of children: Not on file   Years of education: Not on file   Highest  education level: Not on file  Occupational History   Not on file  Tobacco Use   Smoking status: Never   Smokeless tobacco: Never  Vaping Use   Vaping Use: Never used  Substance and Sexual Activity   Alcohol use: Not Currently   Drug use: Never   Sexual activity: Not Currently  Other Topics Concern   Not on file  Social History Narrative   Not on file   Social Determinants of Health   Financial Resource Strain: Not on file  Food Insecurity: Not on file  Transportation Needs: Not on file  Physical Activity: Not on file  Stress: Not on file  Social Connections: Not on file    Allergies: No Known Allergies  Metabolic Disorder Labs: Lab Results  Component Value Date   HGBA1C 5.3 04/18/2020   MPG 105.41 04/18/2020   No results found for: PROLACTIN No results found for: CHOL, TRIG, HDL, CHOLHDL, VLDL, LDLCALC Lab Results  Component Value Date   TSH 0.756 04/18/2020    Therapeutic Level Labs: No results found for: LITHIUM No results found for: VALPROATE No components found for:  CBMZ  Current Medications: Current Outpatient Medications  Medication Sig Dispense Refill   albuterol (VENTOLIN HFA) 108 (90 Base) MCG/ACT inhaler Inhale 1-2 puffs into the lungs every 6 (six) hours as needed for wheezing or shortness of breath. 18 g 0   B Complex-C (B-COMPLEX WITH VITAMIN C) tablet Take 1 tablet by mouth daily. 120 tablet 0   ferrous sulfate 325 (65 FE) MG tablet Take 1 tablet (325 mg total) by mouth 2 (two) times daily with a meal. 60 tablet 0   FLUoxetine (PROZAC) 40 MG capsule Take 2 capsules (80 mg total) by mouth daily. 60 capsule 2   fluticasone furoate-vilanterol (BREO ELLIPTA) 200-25 MCG/INH AEPB Inhale 1 puff into the lungs daily. 60 each 3   gabapentin (NEURONTIN) 400 MG capsule Take 1 capsule (400 mg total) by mouth at bedtime. 30 capsule 2   HYDROcodone-acetaminophen (NORCO/VICODIN) 5-325 MG tablet Take 1 tablet by mouth every 4 (four) hours as needed. (Patient  taking differently: Take 1 tablet by mouth every 4 (four) hours as needed for moderate pain or severe pain.) 10 tablet 0   lidocaine (LIDODERM) 5 % Place 1 patch onto the skin daily. Remove & Discard patch within 12 hours or as directed by MD (Patient taking differently: Place 1 patch onto the skin daily as needed (pain). Remove & Discard patch within 12 hours or as directed by MD) 30 patch 0   mirtazapine (REMERON) 30 MG tablet Take 1 tablet (30 mg total) by mouth at bedtime. 30 tablet 2   pantoprazole (PROTONIX) 40 MG tablet Take 1 tablet (40 mg total) by mouth daily. 90 tablet 0   QUEtiapine (SEROQUEL) 300 MG tablet Take 1 tablet (300 mg total) by mouth at bedtime. Take 2 tablets at bedtime 60 tablet 2   No current facility-administered medications for this visit.     Musculoskeletal: Strength & Muscle Tone: Unable to assess due to telemedicine visit [place]: Unable to assess due to telemedicine visit Patient leans: Unable to assess due to telemedicine  visit  Psychiatric Specialty Exam: Review of Systems  Psychiatric/Behavioral:  Negative for decreased concentration, dysphoric mood, hallucinations, self-injury, sleep disturbance and suicidal ideas. The patient is nervous/anxious. The patient is not hyperactive.    There were no vitals taken for this visit.There is no height or weight on file to calculate BMI.  General Appearance: Unable to assess due to telemedicine visit  Eye Contact:  Unable to assess due to telemedicine visit  Speech:  Clear and Coherent and Normal Rate  Volume:  Normal  Mood:  Anxious and Euthymic  Affect:  Appropriate and Congruent  Thought Process:  Coherent and Descriptions of Associations: Intact  Orientation:  Full (Time, Place, and Person)  Thought Content: WDL   Suicidal Thoughts:  No  Homicidal Thoughts:  No  Memory:  Immediate;   Good Recent;   Good Remote;   Good  Judgement:  Fair  Insight:  Fair  Psychomotor Activity:  Normal   Concentration:  Concentration: Good and Attention Span: Good  Recall:  Good  Fund of Knowledge: Fair  Language: Good  Akathisia:  No  Handed:  Right  AIMS (if indicated): not done  Assets:  Communication Skills Desire for Improvement Financial Resources/Insurance Housing Social Support  ADL's:  Intact  Cognition: WNL  Sleep:  Good   Screenings: GAD-7    Flowsheet Row Video Visit from 05/18/2021 in [facility] Video Visit from 02/15/2021 in [facility] Video Visit from 12/16/2020 in [facility] Video Visit from 10/14/2020 in [facility]  Total GAD-7 Score 17 12 15 15       PHQ2-9    [place] Video Visit from 05/18/2021 in [facility] Video Visit from 02/15/2021 in [facility] Video Visit from 12/16/2020 in [facility] Office Visit from 11/16/2020 in [place] at [facility] Video Visit from 10/14/2020 in [facility]  PHQ-2 Total Score 0 3 2 6 4   PHQ-9 Total Score -- 14 7 11 11       Flowsheet Row Video Visit from 05/18/2021 in [facility] ED to Hosp-Admission (Discharged) from 04/08/2021 in [place] ED from 03/14/2021 in [place] DEPT  C-SSRS RISK CATEGORY No Risk No Risk No Risk        Assessment and Plan:   Nicole Cunningham is a 61 year old female with a past psychiatric history significant for major depressive disorder and anxiety who presents to [facility] via virtual telephone visit for follow-up and medication management.  Patient reports that she is continuing to take her medications as prescribed and appears to be stable.  She does endorse some stressors but does not provide any other details.   Patient wishes to continue taking her medications as prescribed.  Patient's medications to be e-prescribed to pharmacy of choice.  Collaboration of Care: Collaboration of Care: Medication Management AEB provider managing patient's psychiatric medications and Psychiatrist AEB patient being seen by a mental health provider  Patient/Guardian was advised Release of Information must be obtained prior to any record release in order to collaborate their care with an outside provider. Patient/Guardian was advised if they have not already done so to contact the registration department to sign all necessary forms in order for [place] to release information regarding their care.   Consent: Patient/Guardian gives verbal consent for treatment and  assignment of benefits for services provided during this visit. Patient/Guardian expressed understanding and agreed to proceed.   1. MDD (major depressive disorder), recurrent, in partial remission (HCC)  - FLUoxetine (PROZAC) 40 MG capsule; Take 2 capsules (80 mg total) by mouth daily.  Dispense: 60 capsule; Refill: 2 - gabapentin (NEURONTIN) 400 MG capsule; Take 1 capsule (400 mg total) by mouth at bedtime.  Dispense: 30 capsule; Refill: 2 - QUEtiapine (SEROQUEL) 300 MG tablet; Take 1 tablet (300 mg total) by mouth at bedtime. Take 2 tablets at bedtime  Dispense: 60 tablet; Refill: 2 - mirtazapine (REMERON) 30 MG tablet; Take 1 tablet (30 mg total) by mouth at bedtime.  Dispense: 30 tablet; Refill: 2  2. Anxiety disorder, unspecified type  - gabapentin (NEURONTIN) 400 MG capsule; Take 1 capsule (400 mg total) by mouth at bedtime.  Dispense: 30 capsule; Refill: 2  Patient to follow up in 2 months Provider spent a total of 18 minutes with the patient/reviewing patient's chart  Malachy Mood, PA 05/18/2021, 6:43 PM

## 2021-06-16 ENCOUNTER — Other Ambulatory Visit: Payer: Self-pay

## 2021-06-16 MED ORDER — FLUTICASONE FUROATE-VILANTEROL 200-25 MCG/ACT IN AEPB
INHALATION_SPRAY | RESPIRATORY_TRACT | 3 refills | Status: DC
  Filled 2021-06-16: qty 60, 30d supply, fill #0

## 2021-08-17 ENCOUNTER — Telehealth (HOSPITAL_COMMUNITY): Payer: No Payment, Other | Admitting: Physician Assistant

## 2021-08-17 ENCOUNTER — Encounter (HOSPITAL_COMMUNITY): Payer: Self-pay

## 2021-11-07 ENCOUNTER — Ambulatory Visit (INDEPENDENT_AMBULATORY_CARE_PROVIDER_SITE_OTHER): Payer: Self-pay | Admitting: Family Medicine

## 2021-11-07 ENCOUNTER — Other Ambulatory Visit (HOSPITAL_BASED_OUTPATIENT_CLINIC_OR_DEPARTMENT_OTHER): Payer: Self-pay

## 2021-11-07 ENCOUNTER — Other Ambulatory Visit: Payer: Self-pay

## 2021-11-07 ENCOUNTER — Encounter: Payer: Self-pay | Admitting: Family Medicine

## 2021-11-07 VITALS — BP 147/99 | HR 98 | Temp 98.1°F | Resp 16 | Ht 64.0 in | Wt 88.4 lb

## 2021-11-07 DIAGNOSIS — N1831 Chronic kidney disease, stage 3a: Secondary | ICD-10-CM

## 2021-11-07 DIAGNOSIS — I10 Essential (primary) hypertension: Secondary | ICD-10-CM

## 2021-11-07 MED ORDER — LOSARTAN POTASSIUM-HCTZ 100-25 MG PO TABS
1.0000 | ORAL_TABLET | Freq: Every day | ORAL | 0 refills | Status: DC
Start: 2021-11-07 — End: 2021-12-09
  Filled 2021-11-07 (×2): qty 90, 90d supply, fill #0

## 2021-11-07 NOTE — Progress Notes (Unsigned)
Patient is here for medication refill. Patient said she has no other concerns today   

## 2021-11-07 NOTE — Progress Notes (Unsigned)
Patient is here for medication refill. Patient said she has no other concerns today

## 2021-11-08 ENCOUNTER — Encounter: Payer: Self-pay | Admitting: Family Medicine

## 2021-11-08 ENCOUNTER — Other Ambulatory Visit: Payer: Self-pay | Admitting: Family Medicine

## 2021-11-08 LAB — CMP14+EGFR
ALT: 10 IU/L (ref 0–32)
AST: 19 IU/L (ref 0–40)
Albumin/Globulin Ratio: 1.5 (ref 1.2–2.2)
Albumin: 4.4 g/dL (ref 3.9–4.9)
Alkaline Phosphatase: 97 IU/L (ref 44–121)
BUN/Creatinine Ratio: 11 — ABNORMAL LOW (ref 12–28)
BUN: 12 mg/dL (ref 8–27)
Bilirubin Total: <0.2 mg/dL (ref 0.0–1.2)
CO2: 22 mmol/L (ref 20–29)
Calcium: 11.7 mg/dL — ABNORMAL HIGH (ref 8.7–10.3)
Chloride: 107 mmol/L — ABNORMAL HIGH (ref 96–106)
Creatinine, Ser: 1.09 mg/dL — ABNORMAL HIGH (ref 0.57–1.00)
Globulin, Total: 3 g/dL (ref 1.5–4.5)
Glucose: 97 mg/dL (ref 70–99)
Potassium: 4.6 mmol/L (ref 3.5–5.2)
Sodium: 141 mmol/L (ref 134–144)
Total Protein: 7.4 g/dL (ref 6.0–8.5)
eGFR: 57 mL/min/{1.73_m2} — ABNORMAL LOW (ref >59)

## 2021-11-08 LAB — LIPID PANEL
Chol/HDL Ratio: 2.4 ratio (ref 0.0–4.4)
Cholesterol, Total: 214 mg/dL — ABNORMAL HIGH (ref 100–199)
HDL: 89 mg/dL (ref >39)
LDL Chol Calc (NIH): 105 mg/dL — ABNORMAL HIGH (ref 0–99)
Triglycerides: 119 mg/dL (ref 0–149)
VLDL Cholesterol Cal: 20 mg/dL (ref 5–40)

## 2021-11-08 NOTE — Progress Notes (Signed)
Established Patient Office Visit  Subjective    Patient ID: Nicole Cunningham, female    DOB: 12-22-1960  Age: 61 y.o. MRN: [id number]  CC:  Chief Complaint  Patient presents with   Medication Refill    HPI Nicole Cunningham presents for follow up of hypertension. Patient denies acute complaints or concerns.    Outpatient Encounter Medications as of 11/07/2021  Medication Sig   albuterol (VENTOLIN HFA) 108 (90 Base) MCG/ACT inhaler Inhale 1-2 puffs into the lungs every 6 (six) hours as needed for wheezing or shortness of breath.   B Complex-C (B-COMPLEX WITH VITAMIN C) tablet Take 1 tablet by mouth daily.   ferrous sulfate 325 (65 FE) MG tablet Take 1 tablet (325 mg total) by mouth 2 (two) times daily with a meal.   FLUoxetine (PROZAC) 40 MG capsule Take 2 capsules (80 mg total) by mouth daily.   fluticasone furoate-vilanterol (BREO ELLIPTA) 200-25 MCG/ACT AEPB inhale 1 puff into the lungs daily   gabapentin (NEURONTIN) 400 MG capsule Take 1 capsule (400 mg total) by mouth at bedtime.   mirtazapine (REMERON) 30 MG tablet Take 1 tablet (30 mg total) by mouth at bedtime.   QUEtiapine (SEROQUEL) 300 MG tablet Take 1 tablet (300 mg total) by mouth at bedtime. Take 2 tablets at bedtime   losartan-hydrochlorothiazide (HYZAAR) 100-25 MG tablet Take 1 tablet by mouth daily.   pantoprazole (PROTONIX) 40 MG tablet Take 1 tablet (40 mg total) by mouth daily.   [DISCONTINUED] HYDROcodone-acetaminophen (NORCO/VICODIN) 5-325 MG tablet Take 1 tablet by mouth every 4 (four) hours as needed. (Patient taking differently: Take 1 tablet by mouth every 4 (four) hours as needed for moderate pain or severe pain.)   [DISCONTINUED] Ipratropium-Albuterol (COMBIVENT RESPIMAT) 20-100 MCG/ACT AERS respimat Inhale 1 puff into the lungs every 6 (six) hours. (Patient not taking: No sig reported)   [DISCONTINUED] lidocaine (LIDODERM) 5 % Place 1 patch onto the skin daily. Remove & Discard patch within 12 hours or as  directed by MD (Patient taking differently: Place 1 patch onto the skin daily as needed (pain). Remove & Discard patch within 12 hours or as directed by MD)   [DISCONTINUED] losartan-hydrochlorothiazide (HYZAAR) 100-25 MG tablet Take 1 tablet by mouth daily.   No facility-administered encounter medications on file as of 11/07/2021.    Past Medical History:  Diagnosis Date   Bronchitis    COPD (chronic obstructive pulmonary disease) ([place])    Hypertension     History reviewed. No pertinent surgical history.  Family History  Problem Relation Age of Onset   Diabetes Mother    Diabetes Father     Social History   Socioeconomic History   Marital status: Married    Spouse name: Not on file   Number of children: Not on file   Years of education: Not on file   Highest education level: Not on file  Occupational History   Not on file  Tobacco Use   Smoking status: Never   Smokeless tobacco: Never  Vaping Use   Vaping Use: Never used  Substance and Sexual Activity   Alcohol use: Not Currently   Drug use: Never   Sexual activity: Not Currently  Other Topics Concern   Not on file  Social History Narrative   Not on file   Social Determinants of Health   Financial Resource Strain: Not on file  Food Insecurity: Not on file  Transportation Needs: Not on file  Physical Activity: Not on file  Stress:  Not on file  Social Connections: Not on file  Intimate Partner Violence: Not on file    Review of Systems  All other systems reviewed and are negative.       Objective    BP (!) 147/99   Pulse 98   Temp 98.1 F (36.7 C) (Oral)   Resp 16   Ht _0  (1.626 m)   Wt 88 lb 6.4 oz (40.1 kg)   SpO2 98%   BMI 15.17 kg/m   Physical Exam Vitals and nursing note reviewed.  Constitutional:      General: She is not in acute distress. Cardiovascular:     Rate and Rhythm: Normal rate and regular rhythm.  Pulmonary:     Effort: Pulmonary effort is normal.     Breath sounds:  Normal breath sounds.  Abdominal:     Palpations: Abdomen is soft.     Tenderness: There is no abdominal tenderness.  Musculoskeletal:     Right lower leg: No edema.     Left lower leg: No edema.  Neurological:     General: No focal deficit present.     Mental Status: She is alert and oriented to person, place, and time.         Assessment & Plan:   1. Essential hypertension Elevated reading. Routine labs ordered. Compliance discussed.  meds refilled.  - Lipid Panel - CMP14+EGFR  2. Stage 3a chronic kidney disease ([place]) As above    Return in about 4 weeks (around 12/05/2021) for follow up blood pressure.   Becky Sax, MD

## 2021-11-09 ENCOUNTER — Other Ambulatory Visit: Payer: Self-pay

## 2021-12-05 ENCOUNTER — Ambulatory Visit: Payer: Self-pay | Admitting: Family Medicine

## 2021-12-06 ENCOUNTER — Encounter (HOSPITAL_COMMUNITY): Payer: Self-pay | Admitting: *Deleted

## 2021-12-06 ENCOUNTER — Emergency Department (HOSPITAL_COMMUNITY): Payer: Self-pay

## 2021-12-06 ENCOUNTER — Inpatient Hospital Stay (HOSPITAL_COMMUNITY)
Admission: EM | Admit: 2021-12-06 | Discharge: 2021-12-09 | DRG: 871 | Disposition: A | Discharge status: Home Health | Payer: Self-pay | Attending: Internal Medicine | Admitting: Internal Medicine

## 2021-12-06 ENCOUNTER — Other Ambulatory Visit: Payer: Self-pay

## 2021-12-06 DIAGNOSIS — R634 Abnormal weight loss: Secondary | ICD-10-CM | POA: Diagnosis present

## 2021-12-06 DIAGNOSIS — A419 Sepsis, unspecified organism: Secondary | ICD-10-CM

## 2021-12-06 DIAGNOSIS — Z681 Body mass index (BMI) 19 or less, adult: Secondary | ICD-10-CM

## 2021-12-06 DIAGNOSIS — J449 Chronic obstructive pulmonary disease, unspecified: Secondary | ICD-10-CM | POA: Diagnosis present

## 2021-12-06 DIAGNOSIS — R64 Cachexia: Secondary | ICD-10-CM | POA: Diagnosis present

## 2021-12-06 DIAGNOSIS — R410 Disorientation, unspecified: Secondary | ICD-10-CM | POA: Diagnosis present

## 2021-12-06 DIAGNOSIS — N179 Acute kidney failure, unspecified: Secondary | ICD-10-CM

## 2021-12-06 DIAGNOSIS — F419 Anxiety disorder, unspecified: Secondary | ICD-10-CM | POA: Diagnosis present

## 2021-12-06 DIAGNOSIS — I10 Essential (primary) hypertension: Secondary | ICD-10-CM

## 2021-12-06 DIAGNOSIS — E43 Unspecified severe protein-calorie malnutrition: Secondary | ICD-10-CM | POA: Diagnosis present

## 2021-12-06 DIAGNOSIS — N12 Tubulo-interstitial nephritis, not specified as acute or chronic: Secondary | ICD-10-CM | POA: Diagnosis present

## 2021-12-06 DIAGNOSIS — E876 Hypokalemia: Secondary | ICD-10-CM | POA: Diagnosis present

## 2021-12-06 DIAGNOSIS — R6520 Severe sepsis without septic shock: Secondary | ICD-10-CM

## 2021-12-06 DIAGNOSIS — F3341 Major depressive disorder, recurrent, in partial remission: Secondary | ICD-10-CM

## 2021-12-06 DIAGNOSIS — Z833 Family history of diabetes mellitus: Secondary | ICD-10-CM

## 2021-12-06 DIAGNOSIS — D539 Nutritional anemia, unspecified: Secondary | ICD-10-CM | POA: Diagnosis present

## 2021-12-06 DIAGNOSIS — N390 Urinary tract infection, site not specified: Secondary | ICD-10-CM

## 2021-12-06 DIAGNOSIS — Z79899 Other long term (current) drug therapy: Secondary | ICD-10-CM

## 2021-12-06 DIAGNOSIS — Z7951 Long term (current) use of inhaled steroids: Secondary | ICD-10-CM

## 2021-12-06 DIAGNOSIS — D649 Anemia, unspecified: Secondary | ICD-10-CM

## 2021-12-06 DIAGNOSIS — A4151 Sepsis due to Escherichia coli [E. coli]: Principal | ICD-10-CM | POA: Diagnosis present

## 2021-12-06 DIAGNOSIS — D696 Thrombocytopenia, unspecified: Secondary | ICD-10-CM | POA: Diagnosis present

## 2021-12-06 DIAGNOSIS — N3000 Acute cystitis without hematuria: Secondary | ICD-10-CM

## 2021-12-06 DIAGNOSIS — G9341 Metabolic encephalopathy: Secondary | ICD-10-CM | POA: Diagnosis present

## 2021-12-06 DIAGNOSIS — G9340 Encephalopathy, unspecified: Secondary | ICD-10-CM

## 2021-12-06 DIAGNOSIS — F32A Depression, unspecified: Secondary | ICD-10-CM | POA: Diagnosis present

## 2021-12-06 LAB — CBC WITH DIFFERENTIAL/PLATELET
Abs Immature Granulocytes: 0.53 10*3/uL — ABNORMAL HIGH (ref 0.00–0.07)
Basophils Absolute: 0 10*3/uL (ref 0.0–0.1)
Basophils Relative: 0 %
Eosinophils Absolute: 0 10*3/uL (ref 0.0–0.5)
Eosinophils Relative: 0 %
HCT: 19.2 % — ABNORMAL LOW (ref 36.0–46.0)
Hemoglobin: 6 g/dL — CL (ref 12.0–15.0)
Immature Granulocytes: 3 %
Lymphocytes Relative: 3 %
Lymphs Abs: 0.6 10*3/uL — ABNORMAL LOW (ref 0.7–4.0)
MCH: 18.3 pg — ABNORMAL LOW (ref 26.0–34.0)
MCHC: 31.3 g/dL (ref 30.0–36.0)
MCV: 58.5 fL — ABNORMAL LOW (ref 80.0–100.0)
Monocytes Absolute: 0.6 10*3/uL (ref 0.1–1.0)
Monocytes Relative: 4 %
Neutro Abs: 15.4 10*3/uL — ABNORMAL HIGH (ref 1.7–7.7)
Neutrophils Relative %: 90 %
Platelets: 132 10*3/uL — ABNORMAL LOW (ref 150–400)
RBC: 3.28 MIL/uL — ABNORMAL LOW (ref 3.87–5.11)
RDW: 23.6 % — ABNORMAL HIGH (ref 11.5–15.5)
WBC: 17.1 10*3/uL — ABNORMAL HIGH (ref 4.0–10.5)
nRBC: 0 % (ref 0.0–0.2)

## 2021-12-06 LAB — COMPREHENSIVE METABOLIC PANEL
ALT: 15 U/L (ref 0–44)
AST: 36 U/L (ref 15–41)
Albumin: 3.7 g/dL (ref 3.5–5.0)
Alkaline Phosphatase: 58 U/L (ref 38–126)
Anion gap: 11 (ref 5–15)
BUN: 34 mg/dL — ABNORMAL HIGH (ref 8–23)
CO2: 19 mmol/L — ABNORMAL LOW (ref 22–32)
Calcium: 10.5 mg/dL — ABNORMAL HIGH (ref 8.9–10.3)
Chloride: 106 mmol/L (ref 98–111)
Creatinine, Ser: 2.04 mg/dL — ABNORMAL HIGH (ref 0.44–1.00)
GFR, Estimated: 27 mL/min — ABNORMAL LOW (ref >60)
Glucose, Bld: 146 mg/dL — ABNORMAL HIGH (ref 70–99)
Potassium: 3.8 mmol/L (ref 3.5–5.1)
Sodium: 136 mmol/L (ref 135–145)
Total Bilirubin: 0.7 mg/dL (ref 0.3–1.2)
Total Protein: 7.6 g/dL (ref 6.5–8.1)

## 2021-12-06 LAB — URINALYSIS, ROUTINE W REFLEX MICROSCOPIC
Bilirubin Urine: NEGATIVE
Glucose, UA: NEGATIVE mg/dL
Ketones, ur: NEGATIVE mg/dL
Nitrite: NEGATIVE
Protein, ur: 30 mg/dL — AB
Specific Gravity, Urine: 1.011 (ref 1.005–1.030)
WBC, UA: >50 WBC/hpf — ABNORMAL HIGH (ref 0–5)
pH: 5 (ref 5.0–8.0)

## 2021-12-06 LAB — RAPID URINE DRUG SCREEN, HOSP PERFORMED
Amphetamines: NOT DETECTED
Barbiturates: NOT DETECTED
Benzodiazepines: NOT DETECTED
Cocaine: NOT DETECTED
Opiates: NOT DETECTED
Tetrahydrocannabinol: NOT DETECTED

## 2021-12-06 LAB — POC OCCULT BLOOD, ED: Fecal Occult Bld: NEGATIVE

## 2021-12-06 LAB — LACTIC ACID, PLASMA: Lactic Acid, Venous: 3 mmol/L (ref 0.5–1.9)

## 2021-12-06 MED ORDER — LACTATED RINGERS IV SOLN: INTRAVENOUS | Status: DC

## 2021-12-06 MED ORDER — ONDANSETRON HCL 4 MG PO TABS: 4.0000 mg | ORAL_TABLET | Freq: Four times a day (QID) | ORAL | Status: DC | PRN

## 2021-12-06 MED ORDER — ONDANSETRON HCL 4 MG/2ML IJ SOLN: 4.0000 mg | Freq: Four times a day (QID) | INTRAMUSCULAR | Status: DC | PRN

## 2021-12-06 MED ORDER — SODIUM CHLORIDE 0.9% IV SOLUTION: Freq: Once | INTRAVENOUS | Status: DC

## 2021-12-06 MED ORDER — LACTATED RINGERS IV BOLUS (SEPSIS)
250.0000 mL | Freq: Once | INTRAVENOUS | Status: AC
  Administered 2021-12-07: 250 mL via INTRAVENOUS

## 2021-12-06 MED ORDER — ACETAMINOPHEN 650 MG RE SUPP: 650.0000 mg | Freq: Four times a day (QID) | RECTAL | Status: DC | PRN

## 2021-12-06 MED ORDER — SODIUM CHLORIDE 0.9 % IV SOLN
2.0000 g | INTRAVENOUS | Status: DC
  Administered 2021-12-07 – 2021-12-08 (×3): 2 g via INTRAVENOUS
  Filled 2021-12-06 (×3): qty 20

## 2021-12-06 MED ORDER — ACETAMINOPHEN 325 MG PO TABS
650.0000 mg | ORAL_TABLET | Freq: Four times a day (QID) | ORAL | Status: DC | PRN
  Administered 2021-12-07 (×2): 650 mg via ORAL
  Filled 2021-12-06 (×2): qty 2

## 2021-12-06 MED ORDER — SODIUM CHLORIDE 0.9 % IV BOLUS (SEPSIS)
1000.0000 mL | Freq: Once | INTRAVENOUS | Status: AC
  Administered 2021-12-06: 1000 mL via INTRAVENOUS

## 2021-12-06 MED ORDER — FERROUS SULFATE 325 (65 FE) MG PO TABS
325.0000 mg | ORAL_TABLET | Freq: Two times a day (BID) | ORAL | Status: DC
  Administered 2021-12-07 – 2021-12-09 (×3): 325 mg via ORAL
  Filled 2021-12-06 (×4): qty 1

## 2021-12-06 MED ORDER — SODIUM CHLORIDE 0.9 % IV BOLUS
1000.0000 mL | Freq: Once | INTRAVENOUS | Status: AC
  Administered 2021-12-07: 1000 mL via INTRAVENOUS

## 2021-12-06 MED ORDER — SODIUM CHLORIDE 0.9 % IV SOLN
1000.0000 mL | INTRAVENOUS | Status: DC
  Administered 2021-12-07: 1000 mL via INTRAVENOUS

## 2021-12-06 NOTE — Assessment & Plan Note (Addendum)
Concern for possible malignancy etiology. This is a chronic issue and has been worsening for years. CT abdomen/pelvis and head without evidence of malignancy. Possibly related to underlying psychiatric illness. -Dietitian consult

## 2021-12-06 NOTE — ED Notes (Signed)
When gettting prepared to start I&O cath, pt voided and specimen was collected by purewick. That specimen sent per order of Dr. Lynelle Doctor

## 2021-12-06 NOTE — Assessment & Plan Note (Addendum)
Baseline creatinine is about 1.0. Creatinine of 2.04 on admission. Secondary to sepsis and hypotension. IV fluids initiated. -Continue IV fluids -BMP in AM

## 2021-12-06 NOTE — ED Notes (Signed)
Attempted to perform [place] PIV on pt. Pt could not tolerate stick and refused IV. Will attempt at another time.

## 2021-12-06 NOTE — Assessment & Plan Note (Addendum)
Present on admission. Secondary to UTI. Blood and urine cultures obtained and are pending. Empiric Ceftriaxone initiated. CT abdomen/pelvis confirms likely pyelonephritis. -Continue Ceftriaxone IV -Follow-up culture data

## 2021-12-06 NOTE — ED Notes (Signed)
Pt transport to CT, will attempt for IV access following return.

## 2021-12-06 NOTE — Assessment & Plan Note (Addendum)
CT head negative for acute process. Presumed delirium secondary to sepsis/infection. Appears to be resolved.

## 2021-12-06 NOTE — H&P (Addendum)
History and Physical    Patient: Nicole Cunningham CZY:[id number] DOB: [birth year] DOA: 12/06/2021 DOS: the patient was seen and examined on 12/06/2021 PCP: [name], MD  Patient coming from: Home  Chief Complaint:  Chief Complaint  Patient presents with   Altered Mental Status   HPI: Nicole Cunningham is a 61 y.o. female with medical history significant of COPD, anemia requiring transfusions in Jan 2022 and Jan 2023.  This was recorded as iron def anemia by DC provider Jan 2023 and believed to be due to chronic GI blood loss, though Iron level elevated in Jan 2023.  She was supposed to follow up with GI for possible upper and lower endoscopies, doesn't appear that this has happened.   According to EMS report husband came home and found the patient confused and undressed in the home.  EMS also noted that her blood pressure was low on arrival.  Patient herself states that she came to the ED because she started feeling bad.  She states that started prior to the ambulance coming.  She denies any vomiting or diarrhea.  No dysuria.  She denies any headache.  She denies any recent falls.  Found to have UTI + fever on work up in ED today.  Also worrisome is that her most recent wt of 40.1kg is markedly down from 54kg in Jan 2022 (25% of body wt).  Denies flank pain, back pain, abd pain.  Insists the wt loss is from a "nervous break down".    Review of Systems: As mentioned in the history of present illness. All other systems reviewed and are negative. Past Medical History:  Diagnosis Date   Bronchitis    COPD (chronic obstructive pulmonary disease) (HCC)    Hypertension    History reviewed. No pertinent surgical history. Social History:  reports that she has never smoked. She has never used smokeless tobacco. She reports that she does not currently use alcohol. She reports that she does not use drugs.  No Known Allergies  Family History  Problem Relation Age of Onset   Diabetes  Mother    Diabetes Father     Prior to Admission medications   Medication Sig Start Date End Date Taking? Authorizing Provider  albuterol (VENTOLIN HFA) 108 (90 Base) MCG/ACT inhaler Inhale 1-2 puffs into the lungs every 6 (six) hours as needed for wheezing or shortness of breath. 10/12/20   Rema Fendt, NP  B Complex-C (B-COMPLEX WITH VITAMIN C) tablet Take 1 tablet by mouth daily. 04/10/21   Rolly Salter, MD  ferrous sulfate 325 (65 FE) MG tablet Take 1 tablet (325 mg total) by mouth 2 (two) times daily with a meal. 04/09/21   Rolly Salter, MD  FLUoxetine (PROZAC) 40 MG capsule Take 2 capsules (80 mg total) by mouth daily. 05/18/21   Nwoko, Tommas Olp, PA  fluticasone furoate-vilanterol (BREO ELLIPTA) 200-25 MCG/ACT AEPB inhale 1 puff into the lungs daily 10/12/20   Rema Fendt, NP  gabapentin (NEURONTIN) 400 MG capsule Take 1 capsule (400 mg total) by mouth at bedtime. 05/18/21   Nwoko, Tommas Olp, PA  losartan-hydrochlorothiazide (HYZAAR) 100-25 MG tablet Take 1 tablet by mouth daily. 11/07/21   [name], MD  mirtazapine (REMERON) 30 MG tablet Take 1 tablet (30 mg total) by mouth at bedtime. 05/18/21   Nwoko, Tommas Olp, PA  pantoprazole (PROTONIX) 40 MG tablet Take 1 tablet (40 mg total) by mouth daily. 04/09/21 07/08/21  Rolly Salter, MD  QUEtiapine (  SEROQUEL) 300 MG tablet Take 1 tablet (300 mg total) by mouth at bedtime. Take 2 tablets at bedtime 05/18/21   Nwoko, Isidoro Donning E, PA  Ipratropium-Albuterol (COMBIVENT RESPIMAT) 20-100 MCG/ACT AERS respimat Inhale 1 puff into the lungs every 6 (six) hours. Patient not taking: No sig reported 12/15/19 04/22/20  Elsie Stain, MD    Physical Exam: Vitals:   12/06/21 2130 12/06/21 2300  BP: 114/81 (!) 94/56  Pulse: (!) 109 (!) 102  Resp: 16 20  Temp: 97.8 F (36.6 C)   SpO2: 100% 100%   Constitutional: NAD, calm, comfortable Eyes: PERRL, lids and conjunctivae normal ENMT: Mucous membranes are moist. Posterior pharynx clear of  any exudate or lesions.Normal dentition.  Neck: normal, supple, no masses, no thyromegaly Respiratory: clear to auscultation bilaterally, no wheezing, no crackles. Normal respiratory effort. No accessory muscle use.  Cardiovascular: Regular rate and rhythm, no murmurs / rubs / gallops. No extremity edema. 2+ pedal pulses. No carotid bruits.  Abdomen: no tenderness, no masses palpated. No hepatosplenomegaly. Bowel sounds positive.  Musculoskeletal: no clubbing / cyanosis. No joint deformity upper and lower extremities. Good ROM, no contractures. Normal muscle tone.  Skin: no rashes, lesions, ulcers. No induration Neurologic: CN 2-12 grossly intact. Sensation intact, DTR normal. Strength 5/5 in all 4.  Psychiatric: Normal judgment and insight. Alert and oriented x 3. Normal mood.   Data Reviewed:    CBC    Component Value Date/Time   WBC 17.1 (H) 12/06/2021 2133   RBC 3.28 (L) 12/06/2021 2133   HGB 6.0 (LL) 12/06/2021 2133   HCT 19.2 (L) 12/06/2021 2133   PLT 132 (L) 12/06/2021 2133   MCV 58.5 (L) 12/06/2021 2133   MCH 18.3 (L) 12/06/2021 2133   MCHC 31.3 12/06/2021 2133   RDW 23.6 (H) 12/06/2021 2133   LYMPHSABS 0.6 (L) 12/06/2021 2133   MONOABS 0.6 12/06/2021 2133   EOSABS 0.0 12/06/2021 2133   BASOSABS 0.0 12/06/2021 2133   CMP     Component Value Date/Time   NA 136 12/06/2021 2133   NA 141 11/07/2021 0934   K 3.8 12/06/2021 2133   CL 106 12/06/2021 2133   CO2 19 (L) 12/06/2021 2133   GLUCOSE 146 (H) 12/06/2021 2133   BUN 34 (H) 12/06/2021 2133   BUN 12 11/07/2021 0934   CREATININE 2.04 (H) 12/06/2021 2133   CALCIUM 10.5 (H) 12/06/2021 2133   PROT 7.6 12/06/2021 2133   PROT 7.4 11/07/2021 0934   ALBUMIN 3.7 12/06/2021 2133   ALBUMIN 4.4 11/07/2021 0934   AST 36 12/06/2021 2133   ALT 15 12/06/2021 2133   ALKPHOS 58 12/06/2021 2133   BILITOT 0.7 12/06/2021 2133   BILITOT <0.2 11/07/2021 0934   GFRNONAA 27 (L) 12/06/2021 2133   GFRAA >60 11/29/2018 0931    Urinalysis    Component Value Date/Time   COLORURINE YELLOW 12/06/2021 2133   APPEARANCEUR HAZY (A) 12/06/2021 2133   LABSPEC 1.011 12/06/2021 2133   PHURINE 5.0 12/06/2021 2133   GLUCOSEU NEGATIVE 12/06/2021 2133   HGBUR SMALL (A) 12/06/2021 2133   BILIRUBINUR NEGATIVE 12/06/2021 2133   KETONESUR NEGATIVE 12/06/2021 2133   PROTEINUR 30 (A) 12/06/2021 2133   UROBILINOGEN 0.2 04/24/2007 1344   NITRITE NEGATIVE 12/06/2021 2133   LEUKOCYTESUR LARGE (A) 12/06/2021 2133    CT head = no acute findings.  CXR = no acute findings.  CT AP IMPRESSION: 1. Mild fat stranding surrounding the left kidney with air in the left renal collecting system. There is  also small amount of air in the bladder. Findings are concerning for infection. Early emphysematous pyelonephritis can not be excluded. 2. Tiny focus of air near the gallbladder fundus, indeterminate. Findings may relate present a small amount of free air possibly from the gastric fundus or small gastric diverticulum. 3. 16 mm left renal calculus.  No hydronephrosis. 4. Cholelithiasis.  Gallbladder sludge. 5. Enlarged fibroid uterus. 6. 4 mm right lower lobe nodule is stable from prior. No follow-up needed if patient is low-risk.This recommendation follows the consensus statement: Guidelines for Management of Incidental Pulmonary Nodules Detected on CT Images: From the Fleischner Society 2017; Radiology 2017; 284:228-243.  Assessment and Plan: * Sepsis secondary to UTI (HCC) Sepsis pathway ruleout obstruction with CT AP No obstruction, but ? Early emphysematous pyelonephritis and a 68mm non-obstructing stone No flank pain nor back pain though Likely warrants urology consult in AM. Empiric rocephin UCx pending BCx pending IVF Lactate 3.0  AKI (acute kidney injury) (HCC) Due to hypotension from sepsis. IVF Strict intake and output Repeat BMP in AM Rule out obstruction with CT (see wt loss below)  Acute  encephalopathy Delirium secondayr to #1 above most likely CT head neg  Unintentional weight loss Pt with anemia (? Iron def due to chronic blood loss?), and lost 25% of body wt since Jan 2022.  Also has mild hypercalcemia again today (as well as in [place]). Pt insists wt loss is secondary to "nervous break down". Im a bit concerned though as I'm not so sure a "nervous break down" would explain the hypercalcemia and anemia. Hasnt followed up with GI as recd. Overall picture is highly worrisome for an occult malignancy. Getting CT AP to look for occult GI malignancy at this stage. Also not a bad idea to r/o pyohydronephrosis anyhow, given sepsis + UTI + AKI Presumably still needs the recommended upper and lower endoscopies as in Jan 2023 admission. Continue psych meds when med-rec completed.  Anemia History of iron def anemia presumed to be due to chronic blood loss, h/o same with admits requiring transfusions in Jan 2022 and Jan 2023. 1) transfuse 2u PRBC 2) check anemia pnl 3) odd that iron level was nl in Jan 2022 and high in Jan 2023 4) CT for concern for occult malignancy (see wt loss discussion above)  Hypercalcemia Pt once again mildly hypercalcemic today as with Jan admit. IVF Check PTH Check PTH-RP Check Myeloma panel, SPEP and UPEP.  Essential hypertension Hypotensive today from sepsis with AKI. Hold Hyzaar      Advance Care Planning:   Code Status: Full Code  Consults: None  Family Communication: No family in room  Severity of Illness: The appropriate patient status for this patient is OBSERVATION. Observation status is judged to be reasonable and necessary in order to provide the required intensity of service to ensure the patient's safety. The patient's presenting symptoms, physical exam findings, and initial radiographic and laboratory data in the context of their medical condition is felt to place them at decreased risk for further clinical deterioration.  Furthermore, it is anticipated that the patient will be medically stable for discharge from the hospital within 2 midnights of admission.   Author: Hillary Bow., DO 12/06/2021 11:46 PM  For on call review [email or web address].

## 2021-12-06 NOTE — ED Provider Notes (Addendum)
[facility]-EMERGENCY DEPT Provider Note   CSN: [id number] Arrival date & time: 12/06/21  2106     History  CC: Confusion  Nicole Cunningham is a 61 y.o. female.  HPI   Patient presented to the ER for evaluation confusion.  Patient has history of depression anxiety COPD acute kidney injury, sepsis, syncope.  According to EMS report husband came home and found the patient confused and undressed in the home.  EMS also noted that her blood pressure was low on arrival.  Patient herself states that she came to the ED because she started feeling bad.  She states that started prior to the ambulance coming.  She denies any vomiting or diarrhea.  No dysuria.  She denies any headache.  She denies any recent falls.  Home Medications Prior to Admission medications   Medication Sig Start Date End Date Taking? Authorizing Provider  albuterol (VENTOLIN HFA) 108 (90 Base) MCG/ACT inhaler Inhale 1-2 puffs into the lungs every 6 (six) hours as needed for wheezing or shortness of breath. 10/12/20   Rema Fendt, NP  B Complex-C (B-COMPLEX WITH VITAMIN C) tablet Take 1 tablet by mouth daily. 04/10/21   Rolly Salter, MD  ferrous sulfate 325 (65 FE) MG tablet Take 1 tablet (325 mg total) by mouth 2 (two) times daily with a meal. 04/09/21   Rolly Salter, MD  FLUoxetine (PROZAC) 40 MG capsule Take 2 capsules (80 mg total) by mouth daily. 05/18/21   Nwoko, Tommas Olp, PA  fluticasone furoate-vilanterol (BREO ELLIPTA) 200-25 MCG/ACT AEPB inhale 1 puff into the lungs daily 10/12/20   Rema Fendt, NP  gabapentin (NEURONTIN) 400 MG capsule Take 1 capsule (400 mg total) by mouth at bedtime. 05/18/21   Nwoko, Tommas Olp, PA  losartan-hydrochlorothiazide (HYZAAR) 100-25 MG tablet Take 1 tablet by mouth daily. 11/07/21   [name], MD  mirtazapine (REMERON) 30 MG tablet Take 1 tablet (30 mg total) by mouth at bedtime. 05/18/21   Nwoko, Tommas Olp, PA  pantoprazole (PROTONIX) 40 MG tablet Take 1  tablet (40 mg total) by mouth daily. 04/09/21 07/08/21  Rolly Salter, MD  QUEtiapine (SEROQUEL) 300 MG tablet Take 1 tablet (300 mg total) by mouth at bedtime. Take 2 tablets at bedtime 05/18/21   Nwoko, Stephens Shire E, PA  Ipratropium-Albuterol (COMBIVENT RESPIMAT) 20-100 MCG/ACT AERS respimat Inhale 1 puff into the lungs every 6 (six) hours. Patient not taking: No sig reported 12/15/19 04/22/20  Storm Frisk, MD      Allergies    Patient has no known allergies.    Review of Systems   Review of Systems  Physical Exam Updated Vital Signs BP (!) 94/56   Pulse (!) 102   Temp 97.8 F (36.6 C)   Resp 20   SpO2 100%  Physical Exam Vitals and nursing note reviewed.  Constitutional:      General: She is not in acute distress.    Appearance: She is well-developed.  HENT:     Head: Normocephalic and atraumatic.     Right Ear: External ear normal.     Left Ear: External ear normal.  Eyes:     General: No scleral icterus.       Right eye: No discharge.        Left eye: No discharge.     Conjunctiva/sclera: Conjunctivae normal.  Neck:     Trachea: No tracheal deviation.  Cardiovascular:     Rate and Rhythm: Normal rate.  Pulmonary:     Effort: Pulmonary effort is normal. No respiratory distress.     Breath sounds: No stridor.  Abdominal:     General: There is no distension.  Musculoskeletal:        General: No swelling or deformity.     Cervical back: Neck supple.  Skin:    General: Skin is warm and dry.     Findings: No rash.  Neurological:     General: No focal deficit present.     Mental Status: She is alert.     Cranial Nerves: Cranial nerve deficit: no gross deficits.     Comments: Patient is answering questions and following commands appropriately, able to move all extremities, no facial droop, no slurred speech     ED Results / Procedures / Treatments   Labs (all labs ordered are listed, but only abnormal results are displayed) Labs Reviewed  COMPREHENSIVE  METABOLIC PANEL - Abnormal; Notable for the following components:      Result Value   CO2 19 (*)    Glucose, Bld 146 (*)    BUN 34 (*)    Creatinine, Ser 2.04 (*)    Calcium 10.5 (*)    GFR, Estimated 27 (*)    All other components within normal limits  CBC WITH DIFFERENTIAL/PLATELET - Abnormal; Notable for the following components:   WBC 17.1 (*)    RBC 3.28 (*)    Hemoglobin 6.0 (*)    HCT 19.2 (*)    MCV 58.5 (*)    MCH 18.3 (*)    RDW 23.6 (*)    Platelets 132 (*)    Neutro Abs 15.4 (*)    Lymphs Abs 0.6 (*)    Abs Immature Granulocytes 0.53 (*)    All other components within normal limits  URINALYSIS, ROUTINE W REFLEX MICROSCOPIC - Abnormal; Notable for the following components:   APPearance HAZY (*)    Hgb urine dipstick SMALL (*)    Protein, ur 30 (*)    Leukocytes,Ua LARGE (*)    WBC, UA >50 (*)    Bacteria, UA MANY (*)    All other components within normal limits  LACTIC ACID, PLASMA - Abnormal; Notable for the following components:   Lactic Acid, Venous 3.0 (*)    All other components within normal limits  URINE CULTURE  CULTURE, BLOOD (SINGLE)  RAPID URINE DRUG SCREEN, HOSP PERFORMED  AMMONIA  LACTIC ACID, PLASMA  ETHANOL  VITAMIN B12  FOLATE  IRON AND TIBC  FERRITIN  RETICULOCYTES  POC OCCULT BLOOD, ED  PREPARE RBC (CROSSMATCH)    EKG EKG Interpretation  Date/Time:  Wednesday December 06 2021 22:28:29 EDT Ventricular Rate:  105 PR Interval:  189 QRS Duration: 76 QT Interval:  300 QTC Calculation: 397 R Axis:   74 Text Interpretation: Sinus tachycardia No significant change since last tracing Confirmed by Linwood Dibbles [phone]) on 12/06/2021 10:47:04 PM  Radiology DG Chest 1 View  Result Date: 12/06/2021 CLINICAL DATA:  Confusion and weakness. EXAM: CHEST  1 VIEW COMPARISON:  Abdominal series 04/18/2020 FINDINGS: The heart size and mediastinal contours are within normal limits. Both lungs are clear. The visualized skeletal structures are  unremarkable. IMPRESSION: No active disease. Electronically Signed   By: Darliss Cheney M.D.   On: 12/06/2021 22:09    Procedures .Critical Care  Performed by: Linwood Dibbles, MD Authorized by: Linwood Dibbles, MD   Critical care provider statement:    Critical care time (minutes):  30   Critical  care was time spent personally by me on the following activities:  Development of treatment plan with patient or surrogate, discussions with consultants, evaluation of patient's response to treatment, examination of patient, ordering and review of laboratory studies, ordering and review of radiographic studies, ordering and performing treatments and interventions, pulse oximetry, re-evaluation of patient's condition and review of old charts     Medications Ordered in ED Medications  sodium chloride 0.9 % bolus 1,000 mL (1,000 mLs Intravenous New Bag/Given 12/06/21 2224)    Followed by  0.9 %  sodium chloride infusion (has no administration in time range)  lactated ringers infusion (has no administration in time range)  lactated ringers bolus 250 mL (has no administration in time range)  cefTRIAXone (ROCEPHIN) 2 g in sodium chloride 0.9 % 100 mL IVPB (has no administration in time range)  sodium chloride 0.9 % bolus 1,000 mL (has no administration in time range)  0.9 %  sodium chloride infusion (Manually program via Guardrails IV Fluids) (has no administration in time range)    ED Course/ Medical Decision Making/ A&P Clinical Course as of 12/06/21 2315  Wed Dec 06, 2021  2216 CBC WITH DIFFERENTIAL(!!) White blood cell count elevated, hemoglobin decreased at 6 [JK]  2254 Urinalysis, Routine w reflex microscopic Urine, Unspecified Source(!) Urinalysis is consistent with a urinary tract infection [JK]  2254 Comprehensive metabolic panel(!) Creatinine elevated compared to previous [JK]  2254 POC occult blood, ED Hemoccult negative [JK]  2306 Lactic acid level elevated [JK]    Clinical Course User  Index [JK] Linwood Dibbles, MD                           Medical Decision Making Problems Addressed: Acute cystitis without hematuria: acute illness or injury    Details: Urinalysis consistent with infection.  Treated with IV Rocephin Sepsis with acute renal failure without septic shock, due to unspecified organism, unspecified acute renal failure type [facility]): acute illness or injury that poses a threat to life or bodily functions    Details: Patient noted to have decreased blood pressures.  Elevated lactic acid level.  Symptoms concerning for evolving sepsis.  Patient started on antibiotics.  Fluid resuscitation initiated.  Amount and/or Complexity of Data Reviewed Labs: ordered. Decision-making details documented in ED Course. Radiology: ordered. Discussion of management or test interpretation with external provider(s): Dr Julian Reil evaluation pt for admission  Risk Prescription drug management. Decision regarding hospitalization.   Presented to the ED for evaluation of confusion.  Suspect symptoms related to sepsis.  Patient treated with IV antibiotics and fluids per sepsis protocol.  Etiology appears to be a urinary source.  Patient also noted to have anemia..  Fecal occult negative.  Unclear etiology.  Will send off anemia panel.  With her hemoglobin of 6 will order blood transfusion.  Plan on admission to hospital for further treatment        Final Clinical Impression(s) / ED Diagnoses Final diagnoses:  Sepsis with acute renal failure without septic shock, due to unspecified organism, unspecified acute renal failure type (HCC)  Acute cystitis without hematuria  Anemia, unspecified type    Rx / DC Orders ED Discharge Orders     None         Linwood Dibbles, MD 12/06/21 2315    Linwood Dibbles, MD 12/06/21 2352

## 2021-12-06 NOTE — ED Triage Notes (Signed)
Pt found by husband to be confused this evening. Frequent urination with discolored urine reported to EMS.

## 2021-12-06 NOTE — Assessment & Plan Note (Addendum)
Patient appears to have a history of chronic anemia. Patient was previously recommended to follow-up with GI. Patient without overt evidence of GI bleeding at this time and FOBT is negative. Hemoglobin of 6.0 > 5.3 on admission. Patient transfused 1 unit of PRBC (2nd unit not transfused). Hemoglobin of 7.2.  -CBC in AM -Continue iron supplementation

## 2021-12-06 NOTE — Assessment & Plan Note (Addendum)
Mildly elevated. Calcium of 10.5. PTH, PTH-RP and multiple myeloma panel ordered on admission. Hypercalcemia is now resolved.

## 2021-12-06 NOTE — Sepsis Progress Note (Signed)
Elink following Code Sepsis. 

## 2021-12-06 NOTE — Assessment & Plan Note (Addendum)
Patient is on losartan and hydrochlorothiazide as an outpatient, which are held secondary to AKI and hypotension on admission.

## 2021-12-06 NOTE — ED Notes (Signed)
Pt transported to CT again, will initiate second IV and bloodwork upon return.

## 2021-12-07 ENCOUNTER — Encounter (HOSPITAL_COMMUNITY): Payer: Self-pay | Admitting: Internal Medicine

## 2021-12-07 DIAGNOSIS — D696 Thrombocytopenia, unspecified: Secondary | ICD-10-CM

## 2021-12-07 DIAGNOSIS — R410 Disorientation, unspecified: Secondary | ICD-10-CM

## 2021-12-07 DIAGNOSIS — F32A Depression, unspecified: Secondary | ICD-10-CM

## 2021-12-07 LAB — BASIC METABOLIC PANEL
Anion gap: 7 (ref 5–15)
BUN: 29 mg/dL — ABNORMAL HIGH (ref 8–23)
CO2: 20 mmol/L — ABNORMAL LOW (ref 22–32)
Calcium: 9.2 mg/dL (ref 8.9–10.3)
Chloride: 107 mmol/L (ref 98–111)
Creatinine, Ser: 1.63 mg/dL — ABNORMAL HIGH (ref 0.44–1.00)
GFR, Estimated: 35 mL/min — ABNORMAL LOW (ref >60)
Glucose, Bld: 112 mg/dL — ABNORMAL HIGH (ref 70–99)
Potassium: 3.5 mmol/L (ref 3.5–5.1)
Sodium: 134 mmol/L — ABNORMAL LOW (ref 135–145)

## 2021-12-07 LAB — LACTIC ACID, PLASMA: Lactic Acid, Venous: 1.7 mmol/L (ref 0.5–1.9)

## 2021-12-07 LAB — CBC
HCT: 17.1 % — ABNORMAL LOW (ref 36.0–46.0)
Hemoglobin: 5.3 g/dL — CL (ref 12.0–15.0)
MCH: 18.7 pg — ABNORMAL LOW (ref 26.0–34.0)
MCHC: 31 g/dL (ref 30.0–36.0)
MCV: 60.4 fL — ABNORMAL LOW (ref 80.0–100.0)
Platelets: 110 10*3/uL — ABNORMAL LOW (ref 150–400)
RBC: 2.83 MIL/uL — ABNORMAL LOW (ref 3.87–5.11)
RDW: 25.3 % — ABNORMAL HIGH (ref 11.5–15.5)
WBC: 12.3 10*3/uL — ABNORMAL HIGH (ref 4.0–10.5)
nRBC: 0 % (ref 0.0–0.2)

## 2021-12-07 LAB — PREPARE RBC (CROSSMATCH)

## 2021-12-07 LAB — HEMOGLOBIN AND HEMATOCRIT, BLOOD
HCT: 21.5 % — ABNORMAL LOW (ref 36.0–46.0)
Hemoglobin: 7.2 g/dL — ABNORMAL LOW (ref 12.0–15.0)

## 2021-12-07 LAB — RETICULOCYTES
Immature Retic Fract: 17.6 % — ABNORMAL HIGH (ref 2.3–15.9)
RBC.: 2.98 MIL/uL — ABNORMAL LOW (ref 3.87–5.11)
Retic Count, Absolute: 25 10*3/uL (ref 19.0–186.0)
Retic Ct Pct: 0.8 % (ref 0.4–3.1)

## 2021-12-07 LAB — AMMONIA: Ammonia: <10 umol/L (ref 9–35)

## 2021-12-07 LAB — ETHANOL: Alcohol, Ethyl (B): <10 mg/dL (ref <10)

## 2021-12-07 MED ORDER — SODIUM CHLORIDE 0.9 % IV SOLN
1000.0000 mL | INTRAVENOUS | Status: DC
  Administered 2021-12-07 – 2021-12-09 (×4): 1000 mL via INTRAVENOUS

## 2021-12-07 MED ORDER — QUETIAPINE FUMARATE 100 MG PO TABS
600.0000 mg | ORAL_TABLET | Freq: Every day | ORAL | Status: DC
  Administered 2021-12-07 – 2021-12-08 (×2): 600 mg via ORAL
  Filled 2021-12-07 (×2): qty 6

## 2021-12-07 MED ORDER — MIRTAZAPINE 15 MG PO TABS
30.0000 mg | ORAL_TABLET | Freq: Every day | ORAL | Status: DC
  Administered 2021-12-07 – 2021-12-08 (×2): 30 mg via ORAL
  Filled 2021-12-07 (×2): qty 2

## 2021-12-07 MED ORDER — FLUOXETINE HCL 20 MG PO CAPS
80.0000 mg | ORAL_CAPSULE | Freq: Every day | ORAL | Status: DC
  Administered 2021-12-07 – 2021-12-09 (×3): 80 mg via ORAL
  Filled 2021-12-07 (×3): qty 4

## 2021-12-07 NOTE — ED Notes (Addendum)
Provided pt's husband an update on pt. Informed pt's husband that pt will be receiving blood transfusion, who provided verbal consent.

## 2021-12-07 NOTE — Hospital Course (Signed)
Nicole Cunningham is a 61 y.o. female with a history of COPD, anemia, depression, anxiety. Patient presented secondary to altered mental status and was found to have evidence of sepsis secondary to UTI. Empiric antibiotics initiated. Cultures obtained. CT abdomen/pelvis is consistent with likely left sided pyelonephritis.

## 2021-12-07 NOTE — ED Notes (Signed)
Morning labs most likely erroneous due to drawn during transfusion. Hospitalist aware, plan to recheck H&H following x2 blood transfusion completion.

## 2021-12-07 NOTE — Progress Notes (Addendum)
1st unit completely transfused, pt now has fever of 102. Getting tylenol, holding off on 2nd unit for the moment. When fever improves / resolves -> try transfusing 2nd unit. Note that pt was febrile in ED prior to transfusion as well (from pyelonephritis / sepsis).  Not at all certain that transfusion had anything to do with worsening fever.

## 2021-12-07 NOTE — ED Notes (Signed)
Husband/ son back to BS. Aware of bedbugs. Precautions in place. CN aware.

## 2021-12-07 NOTE — Progress Notes (Signed)
PROGRESS NOTE    Nicole Cunningham  ZOX:[id number] DOB: 1960/10/20 DOA: 12/06/2021 PCP: [name], MD   Brief Narrative: Nicole Cunningham is a 61 y.o. female with a history of COPD, anemia, depression, anxiety. Patient presented secondary to altered mental status and was found to have evidence of sepsis secondary to UTI. Empiric antibiotics initiated. Cultures obtained. CT abdomen/pelvis is consistent with likely left sided pyelonephritis.   Assessment and Plan: * Severe sepsis ([place]) Present on admission. Secondary to UTI. Blood and urine cultures obtained and are pending. Empiric Ceftriaxone initiated. CT abdomen/pelvis confirms likely pyelonephritis. -Continue Ceftriaxone IV -Follow-up culture data  AKI (acute kidney injury) ([place]) Baseline creatinine is about 1.0. Creatinine of 2.04 on admission. Secondary to sepsis and hypotension. IV fluids initiated. -Continue IV fluids -BMP in AM  Delirium CT head negative for acute process. Presumed delirium secondary to sepsis/infection. Appears to be resolved.  Unintentional weight loss Concern for possible malignancy etiology. This is a chronic issue and has been worsening for years. CT abdomen/pelvis and head without evidence of malignancy. Possibly related to underlying psychiatric illness. -Dietitian consult  Anemia Patient appears to have a history of chronic anemia. Patient was previously recommended to follow-up with GI. Patient without overt evidence of GI bleeding at this time and FOBT is negative. Hemoglobin of 6.0 > 5.3 on admission. Patient transfused 1 unit of PRBC (2nd unit not transfused). Hemoglobin of 7.2.  -CBC in AM -Continue iron supplementation  Essential hypertension Patient is on losartan and hydrochlorothiazide as an outpatient, which are held secondary to AKI and hypotension on admission.  Hypercalcemia-resolved as of 12/07/2021 Mildly elevated. Calcium of 10.5. PTH, PTH-RP and multiple myeloma panel  ordered on admission. Hypercalcemia is now resolved.  Thrombocytopenia ([place]) Downtrending in setting of acute infection. Will likely rebound with treatment of infection.  Anxiety and depression -Continue home Seroquel, Prozac and Remeron    DVT prophylaxis: SCDs Code Status:   Code Status: Full Code Family Communication: None at bedside. Husband via telephone. Disposition Plan: Discharge home likely in 2-3 days pending culture data and ability to transition to oral antibiotics.   Consultants:  None  Procedures:  None  Antimicrobials: Ceftriaxone    Subjective: Patient reports no concerns this morning.  Objective: BP 113/73   Pulse 80   Temp 98.5 F (36.9 C) (Oral)   Resp 19   SpO2 100%   Examination:  General exam: Appears calm and comfortable Respiratory system: Clear to auscultation. Respiratory effort normal. Cardiovascular system: S1 & S2 heard, RRR. No murmurs, rubs, gallops or clicks. Gastrointestinal system: Abdomen is nondistended, soft and nontender. No organomegaly or masses felt. Normal bowel sounds heard. Central nervous system: Alert and oriented to person and place. Musculoskeletal: No edema. No calf tenderness Skin: No cyanosis. No rashes   Data Reviewed: I have personally reviewed following labs and imaging studies  CBC Lab Results  Component Value Date   WBC 12.3 (H) 12/07/2021   RBC 2.83 (L) 12/07/2021   HGB 5.3 (LL) 12/07/2021   HCT 17.1 (L) 12/07/2021   MCV 60.4 (L) 12/07/2021   MCH 18.7 (L) 12/07/2021   PLT 110 (L) 12/07/2021   MCHC 31.0 12/07/2021   RDW 25.3 (H) 12/07/2021   LYMPHSABS 0.6 (L) 12/06/2021   MONOABS 0.6 12/06/2021   EOSABS 0.0 12/06/2021   BASOSABS 0.0 81/19/1478     Last metabolic panel Lab Results  Component Value Date   NA 134 (L) 12/07/2021   K 3.5 12/07/2021   CL  107 12/07/2021   CO2 20 (L) 12/07/2021   BUN 29 (H) 12/07/2021   CREATININE 1.63 (H) 12/07/2021   GLUCOSE 112 (H) 12/07/2021   GFRNONAA  35 (L) 12/07/2021   GFRAA >60 11/29/2018   CALCIUM 9.2 12/07/2021   PHOS 4.3 04/22/2020   PROT 7.6 12/06/2021   ALBUMIN 3.7 12/06/2021   LABGLOB 3.0 11/07/2021   AGRATIO 1.5 11/07/2021   BILITOT 0.7 12/06/2021   ALKPHOS 58 12/06/2021   AST 36 12/06/2021   ALT 15 12/06/2021   ANIONGAP 7 12/07/2021    GFR: CrCl cannot be calculated (Unknown ideal weight.).  Recent Results (from the past 240 hour(s))  Culture, blood (single)     Status: None (Preliminary result)   Collection Time: 12/07/21 12:07 AM   Specimen: BLOOD  Result Value Ref Range Status   Specimen Description   Final    BLOOD LEFT ANTECUBITAL Performed at [facility] [address], [place], [place] [address]    Special Requests   Final    BLOOD Blood Culture adequate volume Performed at [facility] [address], [place], [place] [address]    Culture   Final    NO GROWTH < 12 HOURS Performed at [facility] [address], [place], [place] [address]    Report Status PENDING  Incomplete      Radiology Studies: CT ABDOMEN PELVIS WO CONTRAST  Result Date: 12/06/2021 CLINICAL DATA:  Chronic gastrointestinal bleeding.  Weight loss. EXAM: CT ABDOMEN AND PELVIS WITHOUT CONTRAST TECHNIQUE: Multidetector CT imaging of the abdomen and pelvis was performed following the standard protocol without IV contrast. RADIATION DOSE REDUCTION: This exam was performed according to the departmental dose-optimization program which includes automated exposure control, adjustment of the mA and/or kV according to patient size and/or use of iterative reconstruction technique. COMPARISON:  CT chest abdomen and pelvis 03/14/2021 FINDINGS: Lower chest: There is a 4 mm nodule in the right lower lobe which is unchanged from the prior examination. Lung bases are otherwise clear. Hepatobiliary: The gallbladder is dense which may be related to gallbladder sludge. Gallstone is seen in the gallbladder  neck. There is no biliary ductal dilatation. The liver is within normal limits. Pancreas: Unremarkable. No pancreatic ductal dilatation or surrounding inflammatory changes. Spleen: Normal in size without focal abnormality. Adrenals/Urinary Tract: The right kidney, and adrenal glands appear within normal limits. Small amount of air seen in the bladder. There is mild left-sided perinephric fat stranding. There is no hydronephrosis. There is a small amount of air in the renal collecting system. There is a calculus in the inferior pole measuring 16 mm. Stomach/Bowel: There is no evidence for bowel obstruction. Small hiatal hernia is present. The stomach is decompressed. There is a tiny focus of air adjacent to the gallbladder fundus image 2/9 without associated fluid collection. The appendix is within normal limits. There is large stool burden. Vascular/Lymphatic: No significant vascular findings are present. No enlarged abdominal or pelvic lymph nodes. Reproductive: The uterus is enlarged and heterogeneous containing multiple fibroids. Largest fibroid measures 5.5 cm and is densely calcified. The ovaries are not well delineated on this study. Other: There is no ascites or focal abdominal wall hernia. Musculoskeletal: No acute or significant osseous findings. IMPRESSION: 1. Mild fat stranding surrounding the left kidney with air in the left renal collecting system. There is also small amount of air in the bladder. Findings are concerning for infection. Early emphysematous pyelonephritis can not be excluded. 2. Tiny focus  of air near the gallbladder fundus, indeterminate. Findings may relate present a small amount of free air possibly from the gastric fundus or small gastric diverticulum. 3. 16 mm left renal calculus.  No hydronephrosis. 4. Cholelithiasis.  Gallbladder sludge. 5. Enlarged fibroid uterus. 6. 4 mm right lower lobe nodule is stable from prior. No follow-up needed if patient is low-risk.This recommendation  follows the consensus statement: Guidelines for Management of Incidental Pulmonary Nodules Detected on CT Images: From the Fleischner Society 2017; Radiology 2017; 284:228-243. Electronically Signed   By: Ronney Asters M.D.   On: 12/06/2021 23:55   CT HEAD WO CONTRAST  Result Date: 12/06/2021 CLINICAL DATA:  Mental status change, unknown cause.  Confusion. EXAM: CT HEAD WITHOUT CONTRAST TECHNIQUE: Contiguous axial images were obtained from the base of the skull through the vertex without intravenous contrast. RADIATION DOSE REDUCTION: This exam was performed according to the departmental dose-optimization program which includes automated exposure control, adjustment of the mA and/or kV according to patient size and/or use of iterative reconstruction technique. COMPARISON:  04/08/2021. FINDINGS: Brain: No acute intracranial hemorrhage, midline shift or mass effect. No extra-axial fluid collection. Mild periventricular white matter hypodensities are noted bilaterally. No hydrocephalus. Vascular: No hyperdense vessel or unexpected calcification. Skull: Normal. Negative for fracture or focal lesion. Sinuses/Orbits: Mucosal thickening in the maxillary sinuses, ethmoid air cells, and sphenoid sinuses. No acute orbital abnormality. Other: None. IMPRESSION: No acute intracranial process. Electronically Signed   By: Brett Fairy M.D.   On: 12/06/2021 23:19   DG Chest 1 View  Result Date: 12/06/2021 CLINICAL DATA:  Confusion and weakness. EXAM: CHEST  1 VIEW COMPARISON:  Abdominal series 04/18/2020 FINDINGS: The heart size and mediastinal contours are within normal limits. Both lungs are clear. The visualized skeletal structures are unremarkable. IMPRESSION: No active disease. Electronically Signed   By: Ronney Asters M.D.   On: 12/06/2021 22:09      LOS: 0 days    Cordelia Poche, MD [facility] 12/07/2021, 1:16 PM   If 7PM-7AM, please contact night-coverage [email or web address]

## 2021-12-07 NOTE — Assessment & Plan Note (Signed)
Downtrending in setting of acute infection. Will likely rebound with treatment of infection.

## 2021-12-07 NOTE — ED Notes (Signed)
Per Dr. Caleb Popp, will hold 2nd unit PRBC given Hgb 7.2

## 2021-12-07 NOTE — ED Notes (Signed)
Rounded on pt, pt currently denies any pain globally, GI upset, any complaints.

## 2021-12-07 NOTE — ED Notes (Signed)
Discharge delay d/t other pt's critical need 

## 2021-12-07 NOTE — ED Notes (Signed)
Informed by Lyda Perone to provide Tylenol for fever and hold off on 2nd blood transfusion until fever improves.

## 2021-12-07 NOTE — Assessment & Plan Note (Signed)
-  Continue home Seroquel, Prozac and Remeron

## 2021-12-07 NOTE — ED Notes (Signed)
Meal given. Husband and son just left. MD into room, at [facility].

## 2021-12-07 NOTE — ED Notes (Signed)
Pt's oral temp now 102 after multiple attempts. Notified hospitalist and paused blood transfusion.

## 2021-12-08 LAB — URINE CULTURE: Culture: >=100000 — AB

## 2021-12-08 LAB — BASIC METABOLIC PANEL
Anion gap: 6 (ref 5–15)
BUN: 21 mg/dL (ref 8–23)
CO2: 21 mmol/L — ABNORMAL LOW (ref 22–32)
Calcium: 9.6 mg/dL (ref 8.9–10.3)
Chloride: 114 mmol/L — ABNORMAL HIGH (ref 98–111)
Creatinine, Ser: 1.15 mg/dL — ABNORMAL HIGH (ref 0.44–1.00)
GFR, Estimated: 54 mL/min — ABNORMAL LOW (ref >60)
Glucose, Bld: 90 mg/dL (ref 70–99)
Potassium: 3 mmol/L — ABNORMAL LOW (ref 3.5–5.1)
Sodium: 141 mmol/L (ref 135–145)

## 2021-12-08 LAB — VITAMIN B12: Vitamin B-12: 351 pg/mL (ref 180–914)

## 2021-12-08 LAB — CBC
HCT: 22.4 % — ABNORMAL LOW (ref 36.0–46.0)
Hemoglobin: 7.4 g/dL — ABNORMAL LOW (ref 12.0–15.0)
MCH: 21.3 pg — ABNORMAL LOW (ref 26.0–34.0)
MCHC: 33 g/dL (ref 30.0–36.0)
MCV: 64.6 fL — ABNORMAL LOW (ref 80.0–100.0)
Platelets: 108 10*3/uL — ABNORMAL LOW (ref 150–400)
RBC: 3.47 MIL/uL — ABNORMAL LOW (ref 3.87–5.11)
RDW: 28.7 % — ABNORMAL HIGH (ref 11.5–15.5)
WBC: 9.6 10*3/uL (ref 4.0–10.5)
nRBC: 0 % (ref 0.0–0.2)

## 2021-12-08 LAB — IRON AND TIBC
Iron: 17 ug/dL — ABNORMAL LOW (ref 28–170)
Saturation Ratios: 7 % — ABNORMAL LOW (ref 10.4–31.8)
TIBC: 255 ug/dL (ref 250–450)
UIBC: 238 ug/dL

## 2021-12-08 LAB — PTH, INTACT AND CALCIUM
Calcium, Total (PTH): 10.4 mg/dL — ABNORMAL HIGH (ref 8.7–10.3)
PTH: 51 pg/mL (ref 15–65)

## 2021-12-08 LAB — GLUCOSE, CAPILLARY: Glucose-Capillary: 87 mg/dL (ref 70–99)

## 2021-12-08 LAB — FERRITIN: Ferritin: 98 ng/mL (ref 11–307)

## 2021-12-08 LAB — FOLATE: Folate: 11.9 ng/mL (ref >5.9)

## 2021-12-08 MED ORDER — ENSURE ENLIVE PO LIQD
237.0000 mL | Freq: Three times a day (TID) | ORAL | Status: DC
  Administered 2021-12-08 – 2021-12-09 (×4): 237 mL via ORAL
  Filled 2021-12-08 (×2): qty 237

## 2021-12-08 MED ORDER — ADULT MULTIVITAMIN W/MINERALS CH
1.0000 | ORAL_TABLET | Freq: Every day | ORAL | Status: DC
  Administered 2021-12-08 – 2021-12-09 (×2): 1 via ORAL
  Filled 2021-12-08 (×2): qty 1

## 2021-12-08 MED ORDER — POTASSIUM CHLORIDE CRYS ER 20 MEQ PO TBCR
40.0000 meq | EXTENDED_RELEASE_TABLET | Freq: Two times a day (BID) | ORAL | Status: DC
  Administered 2021-12-08 – 2021-12-09 (×3): 40 meq via ORAL
  Filled 2021-12-08 (×3): qty 2

## 2021-12-08 NOTE — Progress Notes (Signed)
Initial Nutrition Assessment  DOCUMENTATION CODES:   Severe malnutrition in context of chronic illness  INTERVENTION:   -Needs weight for admission  -Ensure Plus High Protein po TID, each supplement provides 350 kcal and 20 grams of protein.   -Multivitamin with minerals daily  -Liberalized diet to regular given severe malnutrition and pt not appropriate for low fat diet.  -Add chopped meats to diet as well given pt reports difficulty chewing  NUTRITION DIAGNOSIS:   Severe Malnutrition related to chronic illness (COPD) as evidenced by severe fat depletion, severe muscle depletion.  GOAL:   Patient will meet greater than or equal to 90% of their needs  MONITOR:   PO intake, Supplement acceptance, Labs, Weight trends, I & O's  REASON FOR ASSESSMENT:   Consult Assessment of nutrition requirement/status  ASSESSMENT:   61 y.o. female with a history of COPD, anemia requiring frequent transfusion, depression, anxiety. Patient presented secondary to altered mental status and was found to have evidence of sepsis secondary to UTI.  Patient in room, no family at bedside. Alert/oriented x 2. Pt reporting she is eating well, ate chicken, peas and mac and cheese for lunch. When checking the menu ordering system this was the meal she had yesterday. Documented as consuming 100% of her breakfast this morning.  Pt reports she wants to gain weight. Her son said he would bring her some protein shakes to drink. RD to order for patient and pt agreeable. Will also liberalize her diet given she does not need to be on fat restriction.   Per weight records, pt needs weight for admission. Last recorded weight was 88 lbs on 8/15.  Medications: Ferrous sulfate, Remeron, KLOR-CON  Labs reviewed: CBGs: 87 Low K  Low iron  NUTRITION - FOCUSED PHYSICAL EXAM:    Diet Order:   Diet Order             Diet regular Room service appropriate? Yes with Assist; Fluid consistency: Thin  Diet  effective now                   EDUCATION NEEDS:   No education needs have been identified at this time  Skin:  Skin Assessment: Reviewed RN Assessment  Last BM:  PTA  Height:   Ht Readings from Last 1 Encounters:  11/07/21 _0  (1.626 m)    Weight:   Wt Readings from Last 1 Encounters:  11/07/21 40.1 kg   BMI:  There is no height or weight on file to calculate BMI.  Estimated Nutritional Needs:   Kcal:  1650-1850  Protein:  80-90g  Fluid:  1.8L/day  Clayton Bibles, MS, RD, LDN Inpatient Clinical Dietitian Contact information available via Amion

## 2021-12-08 NOTE — Progress Notes (Signed)
PROGRESS NOTE    Nicole Cunningham  YTK:[id number] DOB: [birth year] DOA: 12/06/2021 PCP: [name], MD   Brief Narrative: Nicole Cunningham is a 61 y.o. female with a history of COPD, anemia requiring frequent transfusion, depression, anxiety. Patient presented secondary to altered mental status and was found to have evidence of sepsis secondary to UTI. Empiric antibiotics initiated. Cultures obtained. CT abdomen/pelvis is consistent with likely left sided pyelonephritis. She had been remaining very lethargic and tired for at least 2 days.   Assessment and Plan: * Severe sepsis ([place]) Present on admission. Secondary to UTI.  Blood cultures were negative.  Urine cultures with more than 100,000 colonies of E. coli.   Continue Rocephin today.  CT abdomen/pelvis confirms likely pyelonephritis.  AKI (acute kidney injury) ([place]) Baseline creatinine is about 1.0. Creatinine of 2.04 on admission. Secondary to sepsis and hypotension. IV fluids initiated. -Continue IV fluids. is stabilizing.  Delirium CT head negative for acute process. Presumed delirium secondary to sepsis/infection.  Improving.  Unintentional weight loss CT scans negative for solid malignancies.  Patient will need EGD colonoscopy.   This may be related to her underlying psychiatric illness.   Consult nutrition.  Augment nutrition.    Anemia Patient appears to have a history of chronic anemia. Patient was previously recommended to follow-up with GI. Patient without overt evidence of GI bleeding at this time and FOBT is negative. Hemoglobin of 6.0 > 5.3 on admission. Patient transfused 1 unit of PRBC (2nd unit not transfused). Hemoglobin of 7.2-7.4. -Continue iron supplementation -Iron level is low, X51 folic acid is normal.  We will discharge on iron replacement. -Patient will benefit with colonoscopy, will send to referral for GI on discharge.  Essential hypertension Patient is on losartan and hydrochlorothiazide as an  outpatient, which are held secondary to AKI and hypotension on admission.  Hypercalcemia-resolved as of 12/07/2021 Mildly elevated. Calcium of 10.5. PTH, PTH-RP and multiple myeloma panel ordered on admission. Hypercalcemia is now resolved.  Thrombocytopenia ([place]) Downtrending in setting of acute infection.  Stable.  Anxiety and depression -Continue home Seroquel, Prozac and Remeron  Hypokalemia -Replace and monitor.  Start mobilizing.  Consult PT OT.    DVT prophylaxis: SCDs Code Status:   Code Status: Full Code Family Communication: Husband and son at the bedside. Disposition Plan: Discharge home likely in next 24 to 48 hours.   Consultants:  None  Procedures:  None  Antimicrobials: Ceftriaxone 9/13--   Subjective: Patient seen and examined.  Husband and son at the bedside.  Patient is poor historian.  She denies any complaints.  Denies any nausea vomiting or flank pain.  Objective: BP 112/80 (BP Location: Right Arm)   Pulse 75   Temp (!) 97.3 F (36.3 C) (Oral)   Resp 16   SpO2 100%   Examination:  General exam: Appears calm and comfortable Frail and debilitated.  She is cachectic. Respiratory system: Clear to auscultation. Respiratory effort normal. Cardiovascular system: S1 & S2 heard, RRR. No murmurs, rubs, gallops or clicks. Gastrointestinal system: Abdomen is nondistended, soft and nontender. No organomegaly or masses felt. Normal bowel sounds heard. Central nervous system: Alert and oriented to person and place.  Poor historian.  Flat affect. Musculoskeletal: No edema. No calf tenderness Skin: No cyanosis. No rashes   Data Reviewed: I have personally reviewed following labs and imaging studies  CBC Lab Results  Component Value Date   WBC 9.6 12/08/2021   RBC 3.47 (L) 12/08/2021   HGB 7.4 (L) 12/08/2021  HCT 22.4 (L) 12/08/2021   MCV 64.6 (L) 12/08/2021   MCH 21.3 (L) 12/08/2021   PLT 108 (L) 12/08/2021   MCHC 33.0 12/08/2021   RDW 28.7  (H) 12/08/2021   LYMPHSABS 0.6 (L) 12/06/2021   MONOABS 0.6 12/06/2021   EOSABS 0.0 12/06/2021   BASOSABS 0.0 17/61/6073     Last metabolic panel Lab Results  Component Value Date   NA 141 12/08/2021   K 3.0 (L) 12/08/2021   CL 114 (H) 12/08/2021   CO2 21 (L) 12/08/2021   BUN 21 12/08/2021   CREATININE 1.15 (H) 12/08/2021   GLUCOSE 90 12/08/2021   GFRNONAA 54 (L) 12/08/2021   GFRAA >60 11/29/2018   CALCIUM 9.6 12/08/2021   PHOS 4.3 04/22/2020   PROT 7.6 12/06/2021   ALBUMIN 3.7 12/06/2021   LABGLOB 3.0 11/07/2021   AGRATIO 1.5 11/07/2021   BILITOT 0.7 12/06/2021   ALKPHOS 58 12/06/2021   AST 36 12/06/2021   ALT 15 12/06/2021   ANIONGAP 6 12/08/2021    GFR: CrCl cannot be calculated (Unknown ideal weight.).  Recent Results (from the past 240 hour(s))  Urine Culture     Status: Abnormal   Collection Time: 12/06/21  9:34 PM   Specimen: In/Out Cath Urine  Result Value Ref Range Status   Specimen Description   Final    IN/OUT CATH URINE Performed at [facility] [address] [place], [place] [address]    Special Requests   Final    NONE Performed at [facility], [place] [address], [place], [place] [address]    Culture >=100,000 COLONIES/mL ESCHERICHIA COLI (A)  Final   Report Status 12/08/2021 FINAL  Final   Organism ID, Bacteria ESCHERICHIA COLI (A)  Final      Susceptibility   Escherichia coli - MIC*    AMPICILLIN >=32 RESISTANT Resistant     CEFAZOLIN <=4 SENSITIVE Sensitive     CEFEPIME <=0.12 SENSITIVE Sensitive     CEFTRIAXONE <=0.25 SENSITIVE Sensitive     CIPROFLOXACIN <=0.25 SENSITIVE Sensitive     GENTAMICIN <=1 SENSITIVE Sensitive     IMIPENEM <=0.25 SENSITIVE Sensitive     NITROFURANTOIN <=16 SENSITIVE Sensitive     TRIMETH/SULFA <=20 SENSITIVE Sensitive     AMPICILLIN/SULBACTAM 16 INTERMEDIATE Intermediate     PIP/TAZO <=4 SENSITIVE Sensitive     * >=100,000 COLONIES/mL ESCHERICHIA COLI  Culture, blood  (single)     Status: None (Preliminary result)   Collection Time: 12/07/21 12:07 AM   Specimen: BLOOD  Result Value Ref Range Status   Specimen Description   Final    BLOOD LEFT ANTECUBITAL Performed at [facility] [address], [place], [place] [address]    Special Requests   Final    BLOOD Blood Culture adequate volume Performed at [facility] [address] [place], [place] [address]    Culture   Final    NO GROWTH 1 DAY Performed at [facility], [place] [address], [place],  [address]    Report Status PENDING  Incomplete      Radiology Studies: CT ABDOMEN PELVIS WO CONTRAST  Result Date: 12/06/2021 CLINICAL DATA:  Chronic gastrointestinal bleeding.  Weight loss. EXAM: CT ABDOMEN AND PELVIS WITHOUT CONTRAST TECHNIQUE: Multidetector CT imaging of the abdomen and pelvis was performed following the standard protocol without IV contrast. RADIATION DOSE REDUCTION: This exam was performed according to the departmental dose-optimization program which includes automated exposure control, adjustment of the  mA and/or kV according to patient size and/or use of iterative reconstruction technique. COMPARISON:  CT chest abdomen and pelvis 03/14/2021 FINDINGS: Lower chest: There is a 4 mm nodule in the right lower lobe which is unchanged from the prior examination. Lung bases are otherwise clear. Hepatobiliary: The gallbladder is dense which may be related to gallbladder sludge. Gallstone is seen in the gallbladder neck. There is no biliary ductal dilatation. The liver is within normal limits. Pancreas: Unremarkable. No pancreatic ductal dilatation or surrounding inflammatory changes. Spleen: Normal in size without focal abnormality. Adrenals/Urinary Tract: The right kidney, and adrenal glands appear within normal limits. Small amount of air seen in the bladder. There is mild left-sided perinephric fat stranding. There is no hydronephrosis. There is a  small amount of air in the renal collecting system. There is a calculus in the inferior pole measuring 16 mm. Stomach/Bowel: There is no evidence for bowel obstruction. Small hiatal hernia is present. The stomach is decompressed. There is a tiny focus of air adjacent to the gallbladder fundus image 2/9 without associated fluid collection. The appendix is within normal limits. There is large stool burden. Vascular/Lymphatic: No significant vascular findings are present. No enlarged abdominal or pelvic lymph nodes. Reproductive: The uterus is enlarged and heterogeneous containing multiple fibroids. Largest fibroid measures 5.5 cm and is densely calcified. The ovaries are not well delineated on this study. Other: There is no ascites or focal abdominal wall hernia. Musculoskeletal: No acute or significant osseous findings. IMPRESSION: 1. Mild fat stranding surrounding the left kidney with air in the left renal collecting system. There is also small amount of air in the bladder. Findings are concerning for infection. Early emphysematous pyelonephritis can not be excluded. 2. Tiny focus of air near the gallbladder fundus, indeterminate. Findings may relate present a small amount of free air possibly from the gastric fundus or small gastric diverticulum. 3. 16 mm left renal calculus.  No hydronephrosis. 4. Cholelithiasis.  Gallbladder sludge. 5. Enlarged fibroid uterus. 6. 4 mm right lower lobe nodule is stable from prior. No follow-up needed if patient is low-risk.This recommendation follows the consensus statement: Guidelines for Management of Incidental Pulmonary Nodules Detected on CT Images: From the Fleischner Society 2017; Radiology 2017; 284:228-243. Electronically Signed   By: Ronney Asters M.D.   On: 12/06/2021 23:55   CT HEAD WO CONTRAST  Result Date: 12/06/2021 CLINICAL DATA:  Mental status change, unknown cause.  Confusion. EXAM: CT HEAD WITHOUT CONTRAST TECHNIQUE: Contiguous axial images were obtained  from the base of the skull through the vertex without intravenous contrast. RADIATION DOSE REDUCTION: This exam was performed according to the departmental dose-optimization program which includes automated exposure control, adjustment of the mA and/or kV according to patient size and/or use of iterative reconstruction technique. COMPARISON:  04/08/2021. FINDINGS: Brain: No acute intracranial hemorrhage, midline shift or mass effect. No extra-axial fluid collection. Mild periventricular white matter hypodensities are noted bilaterally. No hydrocephalus. Vascular: No hyperdense vessel or unexpected calcification. Skull: Normal. Negative for fracture or focal lesion. Sinuses/Orbits: Mucosal thickening in the maxillary sinuses, ethmoid air cells, and sphenoid sinuses. No acute orbital abnormality. Other: None. IMPRESSION: No acute intracranial process. Electronically Signed   By: Brett Fairy M.D.   On: 12/06/2021 23:19   DG Chest 1 View  Result Date: 12/06/2021 CLINICAL DATA:  Confusion and weakness. EXAM: CHEST  1 VIEW COMPARISON:  Abdominal series 04/18/2020 FINDINGS: The heart size and mediastinal contours are within normal limits. Both lungs are clear. The visualized skeletal  structures are unremarkable. IMPRESSION: No active disease. Electronically Signed   By: Ronney Asters M.D.   On: 12/06/2021 22:09      LOS: 1 day      If 7PM-7AM, please contact night-coverage [email or web address]

## 2021-12-09 ENCOUNTER — Other Ambulatory Visit (HOSPITAL_COMMUNITY): Payer: Self-pay

## 2021-12-09 DIAGNOSIS — E43 Unspecified severe protein-calorie malnutrition: Secondary | ICD-10-CM | POA: Insufficient documentation

## 2021-12-09 MED ORDER — SODIUM CHLORIDE 0.9 % IV SOLN
2.0000 g | Freq: Once | INTRAVENOUS | Status: AC
  Administered 2021-12-09: 2 g via INTRAVENOUS
  Filled 2021-12-09: qty 20

## 2021-12-09 MED ORDER — CEFADROXIL 500 MG PO CAPS
500.0000 mg | ORAL_CAPSULE | Freq: Two times a day (BID) | ORAL | 0 refills | Status: AC
  Filled 2021-12-09: qty 8, 4d supply, fill #0

## 2021-12-09 MED ORDER — FERROUS SULFATE 325 (65 FE) MG PO TABS
325.0000 mg | ORAL_TABLET | Freq: Two times a day (BID) | ORAL | 0 refills | Status: DC
  Filled 2021-12-09: qty 60, 30d supply, fill #0

## 2021-12-09 MED ORDER — POTASSIUM CHLORIDE CRYS ER 20 MEQ PO TBCR
40.0000 meq | EXTENDED_RELEASE_TABLET | Freq: Every day | ORAL | 0 refills | Status: DC
  Filled 2021-12-09: qty 6, 3d supply, fill #0

## 2021-12-09 MED ORDER — QUETIAPINE FUMARATE 300 MG PO TABS
300.0000 mg | ORAL_TABLET | Freq: Every day | ORAL | 2 refills | Status: DC
  Filled 2021-12-09: qty 60, 60d supply, fill #0

## 2021-12-09 NOTE — Evaluation (Signed)
Physical Therapy Evaluation Patient Details Name: DAGNY FIORENTINO MRN: [id number] DOB: Jan 03, 1961 Today's Date: 12/09/2021  History of Present Illness  Nicole Cunningham is a 61 y.o. female with a history of COPD, anemia requiring frequent transfusion, depression, anxiety. Patient presented [birth year]/07[birth year]51 secondary to altered mental status and was found to have evidence of sepsis secondary to UTI. Empiric antibiotics initiated. Cultures obtained. CT abdomen/pelvis is consistent with likely left sided pyelonephritis.  Clinical Impression   Patient admitted for above medical problems.  Patient slow to respond. Patient assisted to ambulate in room with Rw, required min assistance.  Patient reported dizziness, sat down after 20' then ambulated 20' again.  BP 123/83, 100% SPO2 on RA.  Patient resides with spouse, has a RW. Patient should progress to return home with support.  Pt admitted with above diagnosis.  Pt currently with functional limitations due to the deficits listed below (see PT Problem List). Pt will benefit from skilled PT to increase their independence and safety with mobility to allow discharge to the venue listed below.          Recommendations for follow up therapy are one component of a multi-disciplinary discharge planning process, led by the attending physician.  Recommendations may be updated based on patient status, additional functional criteria and insurance authorization.  Follow Up Recommendations Home health PT      Assistance Recommended at Discharge Intermittent Supervision/Assistance  Patient can return home with the following  A little help with walking and/or transfers;A little help with bathing/dressing/bathroom;Help with stairs or ramp for entrance;Assistance with cooking/housework;Assist for transportation    Equipment Recommendations None recommended by PT  Recommendations for Other Services       Functional Status Assessment Patient has had a recent decline in  their functional status and demonstrates the ability to make significant improvements in function in a reasonable and predictable amount of time.     Precautions / Restrictions Precautions Precautions: Fall Precaution Comments: anemia      Mobility  Bed Mobility Overal bed mobility: Needs Assistance Bed Mobility: Supine to Sit     Supine to sit: Min guard, HOB elevated          Transfers Overall transfer level: Needs assistance Equipment used: Rolling walker (2 wheels), 1 person hand held assist Transfers: Sit to/from Stand Sit to Stand: Min assist           General transfer comment: HHA to stand and step to scale, support on scale to step up and down,    Ambulation/Gait Ambulation/Gait assistance: Min assist Gait Distance (Feet): 20 Feet (x 2) Assistive device: Rolling walker (2 wheels) Gait Pattern/deviations: Step-through pattern, Step-to pattern, Drifts right/left Gait velocity: decreased Gait velocity interpretation: <1.8 ft/sec, indicate of risk for recurrent falls   General Gait Details: assist to maneuvre RW around room, reported feeling dizzy, sat down on window seat to rest.  Stairs            Wheelchair Mobility    Modified Rankin (Stroke Patients Only)       Balance Overall balance assessment: History of Falls, Needs assistance Sitting-balance support: Feet supported, No upper extremity supported Sitting balance-[name]: Good     Standing balance support: During functional activity, Bilateral upper extremity supported, Reliant on assistive device for balance Standing balance-[name]: Poor                               Pertinent Vitals/Pain Pain  Assessment Pain Assessment: Faces Faces Pain Scale: Hurts little more Pain Location: head ache Pain Descriptors / Indicators: Aching Pain Intervention(s): Monitored during session    Home Living Family/patient expects to be discharged to:: Private residence Living  Arrangements: Spouse/significant other Available Help at Discharge: Family;Available 24 hours/day Type of Home:  (motel) Home Access: Level entry       Home Layout: One level Home Equipment: Conservation officer, nature (2 wheels)      Prior Function Prior Level of Function : Independent/Modified Independent;History of Falls (last six months)             Mobility Comments: fell x 1 in BR       Hand Dominance   Dominant Hand: Right    Extremity/Trunk Assessment        Lower Extremity Assessment Lower Extremity Assessment: Generalized weakness    Cervical / Trunk Assessment Cervical / Trunk Assessment: Normal  Communication   Communication: No difficulties  Cognition Arousal/Alertness: Awake/alert Behavior During Therapy: WFL for tasks assessed/performed Overall Cognitive Status: No family/caregiver present to determine baseline cognitive functioning                                 General Comments: oriented to place, off by 1 day,slow to respond, follows directions with increased time        General Comments      Exercises     Assessment/Plan    PT Assessment Patient needs continued PT services  PT Problem List Decreased strength;Decreased mobility;Decreased activity tolerance;Decreased cognition;Decreased balance;Decreased knowledge of use of DME;Pain       PT Treatment Interventions DME instruction;Therapeutic activities;Gait training;Cognitive remediation;Therapeutic exercise;Patient/family education;Functional mobility training;Balance training    PT Goals (Current goals can be found in the Care Plan section)  Acute Rehab PT Goals Patient Stated Goal: agreed to ambulation PT Goal Formulation: With patient Time For Goal Achievement: 12/23/21 Potential to Achieve Goals: Good    Frequency Min 3X/week     Co-evaluation               AM-PAC PT "6 Clicks" Mobility  Outcome Measure Help needed turning from your back to your side while in  a flat bed without using bedrails?: A Little Help needed moving from lying on your back to sitting on the side of a flat bed without using bedrails?: A Little Help needed moving to and from a bed to a chair (including a wheelchair)?: A Little Help needed standing up from a chair using your arms (e.g., wheelchair or bedside chair)?: A Little Help needed to walk in hospital room?: A Lot Help needed climbing 3-5 steps with a railing? : Total 6 Click Score: 15    End of Session Equipment Utilized During Treatment: Gait belt Activity Tolerance: Patient limited by fatigue Patient left: in chair;with call bell/phone within reach;with chair alarm set Nurse Communication: Mobility status PT Visit Diagnosis: Unsteadiness on feet (R26.81);History of falling (Z91.81);Difficulty in walking, not elsewhere classified (R26.2)    Time: [id number] PT Time Calculation (min) (ACUTE ONLY): 32 min   Charges:   PT Evaluation $PT Eval Low Complexity: 1 Low PT Treatments $Gait Training: 8-22 mins        [place] Office [phone] Weekend [id number]   Claretha Cooper 12/09/2021, 10:00 AM

## 2021-12-09 NOTE — Evaluation (Signed)
Occupational Therapy Evaluation Patient Details Name: Nicole Cunningham MRN: [id number] DOB: November 02, 1960 Today's Date: 12/09/2021   History of Present Illness Nicole Cunningham is a 61 y.o. female with a history of COPD, anemia requiring frequent transfusion, depression, anxiety. Patient presented 12/07/51 secondary to altered mental status and was found to have evidence of sepsis secondary to UTI. Empiric antibiotics initiated. Cultures obtained. CT abdomen/pelvis is consistent with likely left sided pyelonephritis.   Clinical Impression   Patient is a 61 year old female who was admitted for above. Patient reported living at motel with husband and son. Patient was noted to be impulsive with movements with some confusion over current blood pressure readings v.s. previous one earlier that nursing got. See below for Bps during session. Patient was MI with bed mobility and min guard for standing balance at edge of bed with patient quick to return to bed with drink after following each cue. Patient's session was limited with patient declining to participate in ADL tasks and becoming frustrated with current blood pressure readings not being her blood pressure. Patient would continue to benefit from skilled OT services at this time while admitted and after d/c to address noted deficits in order to improve overall safety and independence in ADLs.   Blood pressure during session: 105/70 mmhg HR 98bpm and (80 MAP)  105/74 sitting  110/74 standing      Recommendations for follow up therapy are one component of a multi-disciplinary discharge planning process, led by the attending physician.  Recommendations may be updated based on patient status, additional functional criteria and insurance authorization.   Follow Up Recommendations  Home health OT    Assistance Recommended at Discharge Frequent or constant Supervision/Assistance  Patient can return home with the following A little help with walking and/or  transfers;A little help with bathing/dressing/bathroom;Assistance with cooking/housework;Direct supervision/assist for financial management;Assist for transportation;Help with stairs or ramp for entrance;Direct supervision/assist for medications management    Functional Status Assessment  Patient has had a recent decline in their functional status and demonstrates the ability to make significant improvements in function in a reasonable and predictable amount of time.  Equipment Recommendations  None recommended by OT    Recommendations for Other Services       Precautions / Restrictions Precautions Precautions: Fall Precaution Comments: anemia      Mobility Bed Mobility Overal bed mobility: Independent                  Transfers                          Balance Overall balance assessment: History of Falls, Needs assistance Sitting-balance support: Feet supported, No upper extremity supported Sitting balance-[name]: Good     Standing balance support: No upper extremity supported, During functional activity Standing balance-[name]: Poor Standing balance comment: patient was noted to have poor safety awareness with impulsive movements at times.                           ADL either performed or assessed with clinical judgement   ADL Overall ADL's : Needs assistance/impaired Eating/Feeding: Modified independent;Sitting Eating/Feeding Details (indicate cue type and reason): in bed. Grooming: Set up;Sitting   Upper Body Bathing: Set up;Bed level   Lower Body Bathing: Set up;Bed level Lower Body Bathing Details (indicate cue type and reason): patient was able to reach BLE at bed level with simulated tasks.  patient declined to wash up at this time. Upper Body Dressing : Set up;Bed level   Lower Body Dressing: Bed level;Set up Lower Body Dressing Details (indicate cue type and reason): patient was able to don/doff socks in bed. patient was  able to reach out of base of support in standing with no LOB simulated reaching above head and at sides. Toilet Transfer: Min guard;Ambulation Toilet Transfer Details (indicate cue type and reason): patient declined to get out of bed or use bathroom at this time. patient was able to take a small step up head of bed and returned to bed. patient declined to further participate at this time. patient was upset reporting that BP that was taken in standing 110/74 mmhg was not "my blood pressure". patient was not receptive to education on BP testing. Toileting- Water quality scientist and Hygiene: Min guard;Sit to/from stand               Vision Patient Visual Report: No change from baseline       Perception     Praxis      Pertinent Vitals/Pain Pain Assessment Pain Assessment: No/denies pain     Hand Dominance Right   Extremity/Trunk Assessment Upper Extremity Assessment Upper Extremity Assessment: Overall WFL for tasks assessed   Lower Extremity Assessment Lower Extremity Assessment: Defer to PT evaluation   Cervical / Trunk Assessment Cervical / Trunk Assessment: Normal   Communication Communication Communication: No difficulties   Cognition Arousal/Alertness: Awake/alert Behavior During Therapy: WFL for tasks assessed/performed Overall Cognitive Status: Difficult to assess                                 General Comments: Patient was noted to have confusion with cues with patient consistently returning to bed after completing each cue. patient reported that the blood pressure that was just taken is not her blood pressure.     General Comments       Exercises     Shoulder Instructions      Home Living Family/patient expects to be discharged to:: Private residence Living Arrangements: Spouse/significant other Available Help at Discharge: Family;Available 24 hours/day (husband works during the day) Type of Home:  (motel) Home Access: Level entry      [place]: One level     Bathroom Shower/Tub: Tub/shower unit         Home Equipment: Conservation officer, nature (2 wheels)          Prior Functioning/Environment Prior Level of Function : Independent/Modified Independent;History of Falls (last six months)                        OT Problem List: Decreased activity tolerance;Decreased safety awareness;Decreased knowledge of precautions;Impaired balance (sitting and/or standing)      OT Treatment/Interventions: Self-care/ADL training;Therapeutic exercise;Neuromuscular education;Energy conservation;DME and/or AE instruction;Therapeutic activities;Balance training;Patient/family education    OT Goals(Current goals can be found in the care plan section) Acute Rehab OT Goals Patient Stated Goal: to get back in bed OT Goal Formulation: Patient unable to participate in goal setting Time For Goal Achievement: 12/23/21 Potential to Achieve Goals: Fair  OT Frequency: Min 2X/week    Co-evaluation              AM-PAC OT "6 Clicks" Daily Activity     Outcome Measure Help from another person eating meals?: None Help from another person taking care of personal grooming?: A Little Help  from another person toileting, which includes using toliet, bedpan, or urinal?: A Little Help from another person bathing (including washing, rinsing, drying)?: A Little Help from another person to put on and taking off regular upper body clothing?: A Little Help from another person to put on and taking off regular lower body clothing?: A Little 6 Click Score: 19   End of Session Equipment Utilized During Treatment: Rolling walker (2 wheels) Nurse Communication: Other (comment) (ok to participate in session)  Activity Tolerance: Patient tolerated treatment well Patient left: in bed;with call bell/phone within reach;with bed alarm set  OT Visit Diagnosis: Unsteadiness on feet (R26.81);Muscle weakness (generalized) (M62.81);Other abnormalities of  gait and mobility (R26.89)                Time: 1445-1500 OT Time Calculation (min): 15 min Charges:  OT General Charges $OT Visit: 1 Visit OT Evaluation $OT Eval Low Complexity: 1 Low  Hasaan Radde OTR/L, MS Acute Rehabilitation Department Office# [phone]   Marcellina Millin 12/09/2021, 3:33 PM

## 2021-12-09 NOTE — Progress Notes (Addendum)
Physical Therapy Treatment Patient Details Name: Nicole Cunningham MRN: [id number] DOB: [birth year] Today's Date: 12/09/2021   History of Present Illness Nicole Cunningham is a 61 y.o. female with a history of COPD, anemia requiring frequent transfusion, depression, anxiety. Patient presented 12/07/51 secondary to altered mental status and was found to have evidence of sepsis secondary to UTI. Empiric antibiotics initiated. Cultures obtained. CT abdomen/pelvis is consistent with likely left sided pyelonephritis.    PT Comments    Assisted patient to bathroom to wash upo. Patient 's balance fair to perform pericare,. Continue PT, possible DC to home.     Recommendations for follow up therapy are one component of a multi-disciplinary discharge planning process, led by the attending physician.  Recommendations may be updated based on patient status, additional functional criteria and insurance authorization.  Follow Up Recommendations  HH PT follow up     Assistance Recommended at Discharge Intermittent Supervision/Assistance  Patient can return home with the following A little help with walking and/or transfers;A little help with bathing/dressing/bathroom;Help with stairs or ramp for entrance;Assistance with cooking/housework;Assist for transportation   Equipment Recommendations       Recommendations for Other Services       Precautions / Restrictions Precautions Precautions: Fall Precaution Comments: anemia     Mobility  Bed Mobility Overal bed mobility: Independent Bed Mobility: Sit to Supine     Supine to sit: Min guard, HOB elevated Sit to supine: Independent        Transfers Overall transfer level: Needs assistance Equipment used: 1 person hand held assist Transfers: Sit to/from Stand Sit to Stand: Min assist           General transfer comment: stands from toilet and be with supervision.    Ambulation/Gait Ambulation/Gait assistance: Min guard Gait Distance  (Feet): 20 Feet (x 2) Assistive device: 1 person hand held assist Gait Pattern/deviations: Step-through pattern Gait velocity: decreased Gait velocity interpretation: <1.8 ft/sec, indicate of risk for recurrent falls   General Gait Details: assisted to walk to BR W/ HHA   Stairs             Wheelchair Mobility    Modified Rankin (Stroke Patients Only)       Balance Overall balance assessment: History of Falls, Needs assistance Sitting-balance support: Feet supported, No upper extremity supported Sitting balance-[name]: Good     Standing balance support: No upper extremity supported, During functional activity Standing balance-[name]: Poor Standing balance comment: patient stands and balances without UE to washe self, pull up briefs and place  sani pad while standing.                            Cognition Arousal/Alertness: Awake/alert Behavior During Therapy: WFL for tasks assessed/performed Overall Cognitive Status: Difficult to assess                                 General Comments: pt found up in room, chair alarm beeping, patient had gotten out of recliner with leg rest up, somewhat tangled in IV and  purewick. Patient reports that she had a BM.        Exercises      General Comments        Pertinent Vitals/Pain Pain Assessment Pain Assessment: No/denies pain Faces Pain Scale: Hurts little more Pain Location: head ache Pain Descriptors / Indicators: Aching Pain Intervention(s): Monitored  during session    Home Living                          Prior Function            PT Goals (current goals can now be found in the care plan section) Progress towards PT goals: Progressing toward goals    Frequency    Min 3X/week      PT Plan Current plan remains appropriate    Co-evaluation              AM-PAC PT "6 Clicks" Mobility   Outcome Measure  Help needed turning from your back to your  side while in a flat bed without using bedrails?: None Help needed moving from lying on your back to sitting on the side of a flat bed without using bedrails?: None Help needed moving to and from a bed to a chair (including a wheelchair)?: A Little Help needed standing up from a chair using your arms (e.g., wheelchair or bedside chair)?: A Little Help needed to walk in hospital room?: A Little Help needed climbing 3-5 steps with a railing? : A Little 6 Click Score: 20    End of Session   Activity Tolerance: Patient tolerated treatment well Patient left: in bed;with call bell/phone within reach;with bed alarm set Nurse Communication: Mobility status PT Visit Diagnosis: Unsteadiness on feet (R26.81);History of falling (Z91.81);Difficulty in walking, not elsewhere classified (R26.2)     Time: [id number] PT Time Calculation (min) (ACUTE ONLY): 23 min  Charges:  $Gait Training: 8-22 mins $Self Care/Home Management: [place] Office [phone] Weekend pager-[phone]    Claretha Cooper 12/09/2021, 1:59 PM

## 2021-12-09 NOTE — Discharge Summary (Signed)
Physician Discharge Summary  Nicole Cunningham [id number] DOB: 1961-01-26 DOA: 12/06/2021  PCP: [name], MD  Admit date: 12/06/2021 Discharge date: 12/09/2021  Admitted From: Home Disposition: Home with home health  Recommendations for Outpatient Follow-up:  Follow up with PCP in 1-2 weeks Please obtain BMP/CBC in one week Follow-up with your behavioral health physician, may need to adjust medications further  Home Health: PT Equipment/Devices: None  Discharge Condition: Fair CODE STATUS: Full code Diet recommendation: Regular diet  Discharge summary: Nicole Cunningham is a 61 y.o. female with a history of COPD, anemia requiring frequent transfusion, depression, anxiety. Patient presented secondary to altered mental status and was found to have evidence of sepsis secondary to UTI. Empiric antibiotics initiated. Cultures obtained. CT abdomen/pelvis is consistent with likely left sided pyelonephritis. She had been remaining very lethargic and tired for at least 2 days.  Severe sepsis present on admission secondary to E. coli UTI.  Urine culture with E. coli.  Blood cultures negative.  CT scan abdomen pelvis with left pyelonephritis, no hydronephrosis or abscess. Treated with IV Rocephin, day 3 with good clinical response.  Afebrile.  WBC count normalized.  We will continue cefadroxil for another 4 days to continue 7 days of therapy.  Acute kidney injury: Due to above.  Treated with IV fluids and normalized.  Altered mental status: She has known underlying anxiety and depression, acute sepsis causing encephalopathy.  Improved and normalized.  Anxiety/depression: On multiple medications.  Causing excessive sedation.  Patient is on fluoxetine, Neurontin that she will continue.  Patient is on Seroquel 600 mg at night, making her too sleepy, will decrease dose to 300 mg daily.  Advised follow-up with psychiatry.  Anemia, acute on chronic: FOBT negative.  Patient with nutritional  anemia.  Never had endoscopy evaluation.  Received 1 unit of PRBC with appropriate response.  Iron levels low, [id number] and folic acid is normal.  Will discharge on oral iron therapy.  She will need to follow-up colonoscopy.  Send referral to GI clinic.  Essential hypertension: Blood pressures are stable.  Low normal and even orthostatic at times.  Patient has lost significant weight, blood pressure medications are not needed.  Discontinued.  Severe protein calorie malnutrition: Cachexia. Nutrition Status: Nutrition Problem: Severe Malnutrition Etiology: chronic illness (COPD) Signs/Symptoms: severe fat depletion, severe muscle depletion Interventions: Ensure Enlive (each supplement provides 350kcal and 20 grams of protein), MVI, Liberalize Diet Advised to improve diet, nutritional supplements at home.  Medically stable.  She can go home with family.     Discharge Diagnoses:  Principal Problem:   Severe sepsis ([place]) Active Problems:   AKI (acute kidney injury) ([place])   Delirium   Unintentional weight loss   Anemia   Essential hypertension   Anxiety and depression   Thrombocytopenia ([facility])   Protein-calorie malnutrition, severe    Discharge Instructions  Discharge Instructions     Ambulatory referral to Gastroenterology   Complete by: As directed    What is the reason for referral?: Colonoscopy   Diet general   Complete by: As directed    Increase activity slowly   Complete by: As directed       Allergies as of 12/09/2021   No Known Allergies      Medication List     STOP taking these medications    B-complex with vitamin C tablet   losartan-hydrochlorothiazide 100-25 MG tablet Commonly known as: HYZAAR   pantoprazole 40 MG tablet Commonly known as: PROTONIX  TAKE these medications    albuterol 108 (90 Base) MCG/ACT inhaler Commonly known as: VENTOLIN HFA Inhale 1-2 puffs into the lungs every 6 (six) hours as needed for wheezing or shortness of  breath.   Breo Ellipta 200-25 MCG/ACT Aepb Generic drug: fluticasone furoate-vilanterol inhale 1 puff into the lungs daily   cefadroxil 500 MG capsule Commonly known as: DURICEF Take 1 capsule (500 mg total) by mouth 2 (two) times daily for 4 days.   ferrous sulfate 325 (65 FE) MG tablet Take 1 tablet (325 mg total) by mouth 2 (two) times daily with a meal.   FLUoxetine 40 MG capsule Commonly known as: PROzac Take 2 capsules (80 mg total) by mouth daily.   gabapentin 400 MG capsule Commonly known as: NEURONTIN Take 1 capsule (400 mg total) by mouth at bedtime.   mirtazapine 30 MG tablet Commonly known as: REMERON Take 1 tablet (30 mg total) by mouth at bedtime.   potassium chloride SA 20 MEQ tablet Commonly known as: KLOR-CON M Take 2 tablets (40 mEq total) by mouth daily for 3 days.   QUEtiapine 300 MG tablet Commonly known as: SEROQUEL Take 1 tablet (300 mg total) by mouth at bedtime. What changed: additional instructions        No Known Allergies  Consultations: None   Procedures/Studies: CT ABDOMEN PELVIS WO CONTRAST  Result Date: 12/06/2021 CLINICAL DATA:  Chronic gastrointestinal bleeding.  Weight loss. EXAM: CT ABDOMEN AND PELVIS WITHOUT CONTRAST TECHNIQUE: Multidetector CT imaging of the abdomen and pelvis was performed following the standard protocol without IV contrast. RADIATION DOSE REDUCTION: This exam was performed according to the departmental dose-optimization program which includes automated exposure control, adjustment of the mA and/or kV according to patient size and/or use of iterative reconstruction technique. COMPARISON:  CT chest abdomen and pelvis 03/14/2021 FINDINGS: Lower chest: There is a 4 mm nodule in the right lower lobe which is unchanged from the prior examination. Lung bases are otherwise clear. Hepatobiliary: The gallbladder is dense which may be related to gallbladder sludge. Gallstone is seen in the gallbladder neck. There is no  biliary ductal dilatation. The liver is within normal limits. Pancreas: Unremarkable. No pancreatic ductal dilatation or surrounding inflammatory changes. Spleen: Normal in size without focal abnormality. Adrenals/Urinary Tract: The right kidney, and adrenal glands appear within normal limits. Small amount of air seen in the bladder. There is mild left-sided perinephric fat stranding. There is no hydronephrosis. There is a small amount of air in the renal collecting system. There is a calculus in the inferior pole measuring 16 mm. Stomach/Bowel: There is no evidence for bowel obstruction. Small hiatal hernia is present. The stomach is decompressed. There is a tiny focus of air adjacent to the gallbladder fundus image 2/9 without associated fluid collection. The appendix is within normal limits. There is large stool burden. Vascular/Lymphatic: No significant vascular findings are present. No enlarged abdominal or pelvic lymph nodes. Reproductive: The uterus is enlarged and heterogeneous containing multiple fibroids. Largest fibroid measures 5.5 cm and is densely calcified. The ovaries are not well delineated on this study. Other: There is no ascites or focal abdominal wall hernia. Musculoskeletal: No acute or significant osseous findings. IMPRESSION: 1. Mild fat stranding surrounding the left kidney with air in the left renal collecting system. There is also small amount of air in the bladder. Findings are concerning for infection. Early emphysematous pyelonephritis can not be excluded. 2. Tiny focus of air near the gallbladder fundus, indeterminate. Findings may relate present a  small amount of free air possibly from the gastric fundus or small gastric diverticulum. 3. 16 mm left renal calculus.  No hydronephrosis. 4. Cholelithiasis.  Gallbladder sludge. 5. Enlarged fibroid uterus. 6. 4 mm right lower lobe nodule is stable from prior. No follow-up needed if patient is low-risk.This recommendation follows the  consensus statement: Guidelines for Management of Incidental Pulmonary Nodules Detected on CT Images: From the Fleischner Society 2017; Radiology 2017; 284:228-243. Electronically Signed   By: Ronney Asters M.D.   On: 12/06/2021 23:55   CT HEAD WO CONTRAST  Result Date: 12/06/2021 CLINICAL DATA:  Mental status change, unknown cause.  Confusion. EXAM: CT HEAD WITHOUT CONTRAST TECHNIQUE: Contiguous axial images were obtained from the base of the skull through the vertex without intravenous contrast. RADIATION DOSE REDUCTION: This exam was performed according to the departmental dose-optimization program which includes automated exposure control, adjustment of the mA and/or kV according to patient size and/or use of iterative reconstruction technique. COMPARISON:  04/08/2021. FINDINGS: Brain: No acute intracranial hemorrhage, midline shift or mass effect. No extra-axial fluid collection. Mild periventricular white matter hypodensities are noted bilaterally. No hydrocephalus. Vascular: No hyperdense vessel or unexpected calcification. Skull: Normal. Negative for fracture or focal lesion. Sinuses/Orbits: Mucosal thickening in the maxillary sinuses, ethmoid air cells, and sphenoid sinuses. No acute orbital abnormality. Other: None. IMPRESSION: No acute intracranial process. Electronically Signed   By: Brett Fairy M.D.   On: 12/06/2021 23:19   DG Chest 1 View  Result Date: 12/06/2021 CLINICAL DATA:  Confusion and weakness. EXAM: CHEST  1 VIEW COMPARISON:  Abdominal series 04/18/2020 FINDINGS: The heart size and mediastinal contours are within normal limits. Both lungs are clear. The visualized skeletal structures are unremarkable. IMPRESSION: No active disease. Electronically Signed   By: Ronney Asters M.D.   On: 12/06/2021 22:09   (Echo, Carotid, EGD, Colonoscopy, ERCP)    Subjective: Patient was seen and examined.  No overnight events.  Sitting in the chair with the help of the nursing staff and eating  breakfast.  Alert awake but flat affect.  She looks more brighter.  She did complain of dizziness and had a 20 point drop in orthostatic blood pressures.  Denies any abdominal pain or nausea.  Eager to go home. Called and discussed with patient's husband on the phone, updated.   Discharge Exam: Vitals:   12/08/21 1946 12/09/21 0441  BP: 115/75 (!) 94/55  Pulse: (!) 103 81  Resp: 18 18  Temp: 98.8 F (37.1 C) 98.8 F (37.1 C)  SpO2: 100% 100%   Vitals:   12/08/21 1321 12/08/21 1946 12/09/21 0441 12/09/21 0930  BP: 99/66 115/75 (!) 94/55   Pulse: 81 (!) 103 81   Resp: 16 18 18    Temp: (!) 97.4 F (36.3 C) 98.8 F (37.1 C) 98.8 F (37.1 C)   TempSrc: Oral Oral Oral   SpO2: 100% 100% 100%   Weight:    41.1 kg    General: Pt is alert, awake, not in acute distress Cachectic.  Not in distress.  Frail and debilitated.  Chronically sick looking.  On room air. Cardiovascular: RRR, S1/S2 +, no rubs, no gallops Respiratory: CTA bilaterally, no wheezing, no rhonchi Abdominal: Soft, NT, ND, bowel sounds + Extremities: no edema, no cyanosis    The results of significant diagnostics from this hospitalization (including imaging, microbiology, ancillary and laboratory) are listed below for reference.     Microbiology: Recent Results (from the past 240 hour(s))  Urine Culture  Status: Abnormal   Collection Time: 12/06/21  9:34 PM   Specimen: In/Out Cath Urine  Result Value Ref Range Status   Specimen Description   Final    IN/OUT CATH URINE Performed at [facility], [place] [address] [place], [place] [address]    Special Requests   Final    NONE Performed at [facility], [place] [address] [place], [place] [address]    Culture >=100,000 COLONIES/mL ESCHERICHIA COLI (A)  Final   Report Status 12/08/2021 FINAL  Final   Organism ID, Bacteria ESCHERICHIA COLI (A)  Final      Susceptibility   Escherichia coli - MIC*    AMPICILLIN >=32  RESISTANT Resistant     CEFAZOLIN <=4 SENSITIVE Sensitive     CEFEPIME <=0.12 SENSITIVE Sensitive     CEFTRIAXONE <=0.25 SENSITIVE Sensitive     CIPROFLOXACIN <=0.25 SENSITIVE Sensitive     GENTAMICIN <=1 SENSITIVE Sensitive     IMIPENEM <=0.25 SENSITIVE Sensitive     NITROFURANTOIN <=16 SENSITIVE Sensitive     TRIMETH/SULFA <=20 SENSITIVE Sensitive     AMPICILLIN/SULBACTAM 16 INTERMEDIATE Intermediate     PIP/TAZO <=4 SENSITIVE Sensitive     * >=100,000 COLONIES/mL ESCHERICHIA COLI  Culture, blood (single)     Status: None (Preliminary result)   Collection Time: 12/07/21 12:07 AM   Specimen: BLOOD  Result Value Ref Range Status   Specimen Description   Final    BLOOD LEFT ANTECUBITAL Performed at [facility] [address], [place], [place] [address]    Special Requests   Final    BLOOD Blood Culture adequate volume Performed at [facility] [address], [place], [place] [address]    Culture   Final    NO GROWTH 2 DAYS Performed at [facility] [address] [place], [place] [address]    Report Status PENDING  Incomplete     Labs: BNP (last 3 results) No results for input(s): "BNP" in the last 8760 hours. Basic Metabolic Panel: Recent Labs  Lab 12/06/21 2133 12/07/21 0007 12/07/21 0358 12/08/21 0527  NA 136  --  134* 141  K 3.8  --  3.5 3.0*  CL 106  --  107 114*  CO2 19*  --  20* 21*  GLUCOSE 146*  --  112* 90  BUN 34*  --  29* 21  CREATININE 2.04*  --  1.63* 1.15*  CALCIUM 10.5* 10.4* 9.2 9.6   Liver Function Tests: Recent Labs  Lab 12/06/21 2133  AST 36  ALT 15  ALKPHOS 58  BILITOT 0.7  PROT 7.6  ALBUMIN 3.7   No results for input(s): "LIPASE", "AMYLASE" in the last 168 hours. Recent Labs  Lab 12/07/21 0007  AMMONIA <10   CBC: Recent Labs  Lab 12/06/21 2133 12/07/21 0358 12/07/21 1300 12/08/21 0527  WBC 17.1* 12.3*  --  9.6  NEUTROABS 15.4*  --   --   --   HGB 6.0* 5.3* 7.2* 7.4*  HCT  19.2* 17.1* 21.5* 22.4*  MCV 58.5* 60.4*  --  64.6*  PLT 132* 110*  --  108*   Cardiac Enzymes: No results for input(s): "CKTOTAL", "CKMB", "CKMBINDEX", "TROPONINI" in the last 168 hours. BNP: Invalid input(s): "POCBNP" CBG: Recent Labs  Lab 12/08/21 0232  GLUCAP 87   D-Dimer No results for input(s): "DDIMER" in the last 72 hours. Hgb A1c No results for input(s): "HGBA1C" in the last 72 hours. Lipid Profile No  results for input(s): "CHOL", "HDL", "LDLCALC", "TRIG", "CHOLHDL", "LDLDIRECT" in the last 72 hours. Thyroid function studies No results for input(s): "TSH", "T4TOTAL", "T3FREE", "THYROIDAB" in the last 72 hours.  Invalid input(s): "FREET3" Anemia work up Recent Labs    12/07/21 0007 12/08/21 0527  VITAMINB12  --  351  FOLATE  --  11.9  FERRITIN  --  98  TIBC  --  255  IRON  --  17*  RETICCTPCT 0.8  --    Urinalysis    Component Value Date/Time   COLORURINE YELLOW 12/06/2021 2133   APPEARANCEUR HAZY (A) 12/06/2021 2133   LABSPEC 1.011 12/06/2021 2133   PHURINE 5.0 12/06/2021 2133   GLUCOSEU NEGATIVE 12/06/2021 2133   HGBUR SMALL (A) 12/06/2021 2133   BILIRUBINUR NEGATIVE 12/06/2021 2133   KETONESUR NEGATIVE 12/06/2021 2133   PROTEINUR 30 (A) 12/06/2021 2133   UROBILINOGEN 0.2 04/24/2007 1344   NITRITE NEGATIVE 12/06/2021 2133   LEUKOCYTESUR LARGE (A) 12/06/2021 2133   Sepsis Labs Recent Labs  Lab 12/06/21 2133 12/07/21 0358 12/08/21 0527  WBC 17.1* 12.3* 9.6   Microbiology Recent Results (from the past 240 hour(s))  Urine Culture     Status: Abnormal   Collection Time: 12/06/21  9:34 PM   Specimen: In/Out Cath Urine  Result Value Ref Range Status   Specimen Description   Final    IN/OUT CATH URINE Performed at [facility], [place] [address] [place], [place] [address]    Special Requests   Final    NONE Performed at [facility], [place] [address] [place], [place] [address]    Culture >=100,000  COLONIES/mL ESCHERICHIA COLI (A)  Final   Report Status 12/08/2021 FINAL  Final   Organism ID, Bacteria ESCHERICHIA COLI (A)  Final      Susceptibility   Escherichia coli - MIC*    AMPICILLIN >=32 RESISTANT Resistant     CEFAZOLIN <=4 SENSITIVE Sensitive     CEFEPIME <=0.12 SENSITIVE Sensitive     CEFTRIAXONE <=0.25 SENSITIVE Sensitive     CIPROFLOXACIN <=0.25 SENSITIVE Sensitive     GENTAMICIN <=1 SENSITIVE Sensitive     IMIPENEM <=0.25 SENSITIVE Sensitive     NITROFURANTOIN <=16 SENSITIVE Sensitive     TRIMETH/SULFA <=20 SENSITIVE Sensitive     AMPICILLIN/SULBACTAM 16 INTERMEDIATE Intermediate     PIP/TAZO <=4 SENSITIVE Sensitive     * >=100,000 COLONIES/mL ESCHERICHIA COLI  Culture, blood (single)     Status: None (Preliminary result)   Collection Time: 12/07/21 12:07 AM   Specimen: BLOOD  Result Value Ref Range Status   Specimen Description   Final    BLOOD LEFT ANTECUBITAL Performed at [facility] [address], [place], [place] [address]    Special Requests   Final    BLOOD Blood Culture adequate volume Performed at [facility] [address] [place], [place] [address]    Culture   Final    NO GROWTH 2 DAYS Performed at [facility] [address], [place], [place] [address]    Report Status PENDING  Incomplete     Time coordinating discharge: 35 minutes  SIGNED:   Barb Merino, MD  [facility] 12/09/2021, 11:32 AM

## 2021-12-09 NOTE — TOC Initial Note (Signed)
Transition of Care [facility]) - Initial/Assessment Note    Patient Details  Name: Nicole Cunningham MRN: [id number] Date of Birth: 15-Apr-1960  Transition of Care [facility]) CM/SW Contact:    Verdell Carmine, RN Phone Number: 12/09/2021, 11:50 AM  Clinical Narrative:                  Nicole Cunningham presented to [facility] with sepsis and UTI , pyelonephritis. She currently has no insurance.  PT recommended Home Health. [facility] is charity this week,  They will likely accept patient with start of next week.  Information sent to [facility] for assistance with Medicaid eligibility. Patient has a walker at home.  Discussed with CSW   Barriers to Discharge: Financial Resources, Inadequate or no insurance   Patient Goals and CMS Choice        Expected Discharge Plan and Services        DC with chairty HH- [facility]   Expected Discharge Date: 12/09/21                                    Prior Living Arrangements/Services                       Activities of Daily Living Home Assistive Devices/Equipment: Gilford Rile (specify type), Eyeglasses (reading glasses) ADL Screening (condition at time of admission) Patient's cognitive ability adequate to safely complete daily activities?: No Is the patient deaf or have difficulty hearing?: No Does the patient have difficulty seeing, even when wearing glasses/contacts?: No Does the patient have difficulty concentrating, remembering, or making decisions?: Yes Patient able to express need for assistance with ADLs?: Yes Does the patient have difficulty dressing or bathing?: No Independently performs ADLs?: No Communication: Independent Dressing (OT): Independent Grooming: Independent Feeding: Independent Bathing: Independent Toileting: Needs assistance Is this a change from baseline?: Pre-admission baseline In/Out Bed: Needs assistance Is this a change from baseline?: Pre-admission baseline Walks in Home: Needs assistance Is this a change from  baseline?: Pre-admission baseline Does the patient have difficulty walking or climbing stairs?: Yes Weakness of Legs: Both Weakness of Arms/Hands: Both  Permission Sought/Granted                  Emotional Assessment              Admission diagnosis:  Acute cystitis without hematuria [N30.00] Sepsis secondary to UTI ([place]) [A41.9, N39.0] Anemia, unspecified type [D64.9] Sepsis with acute renal failure without septic shock, due to unspecified organism, unspecified acute renal failure type ([place]) [A41.9, R65.20, N17.9] Patient Active Problem List   Diagnosis Date Noted   Protein-calorie malnutrition, severe 12/09/2021   Anxiety and depression 12/07/2021   Thrombocytopenia ([place]) 12/07/2021   Unintentional weight loss 12/06/2021   Delirium 12/06/2021   Symptomatic anemia 04/08/2021   Syncope 04/08/2021   MDD (major depressive disorder), recurrent, in partial remission ([place]) 10/14/2020   Essential hypertension 04/25/2020   History of COVID-19 04/25/2020   History of sepsis 04/25/2020   Anemia 04/25/2020   Hypomagnesemia 04/25/2020   COVID-19 virus infection 04/20/2020   AKI (acute kidney injury) ([place]) 04/18/2020   Severe sepsis ([place]) 04/18/2020   Hypertensive urgency 12/15/2019   COPD with chronic bronchitis ([place]) 12/15/2019   MDD (major depressive disorder), recurrent, in full remission ([place]) 12/14/2019   Anxiety disorder 12/14/2019   PCP:  [name], MD Pharmacy:   [facility] ([place]),  [place] - [address] [place] ([place]) [place] [address] Phone: [phone] Fax: [phone]  [facility] - [place], [place] - [address] [place] [address] Phone: [phone] Fax: [phone]  [facility] at [facility] [address] [place] [address] Phone: [phone] Fax: [phone]  [facility] [address] [place] [address] Phone: [phone] Fax: [phone]  [facility] at [facility] [address] [place] [address] Phone: [phone] Fax: [phone]     Social Determinants of Health (SDOH) Interventions    Readmission Risk Interventions     No data to display

## 2021-12-11 ENCOUNTER — Telehealth: Payer: Self-pay

## 2021-12-11 ENCOUNTER — Other Ambulatory Visit (HOSPITAL_COMMUNITY): Payer: Self-pay

## 2021-12-11 LAB — PTH-RELATED PEPTIDE: PTH-related peptide: <2 pmol/L

## 2021-12-11 LAB — TYPE AND SCREEN
ABO/RH(D): AB POS
Antibody Screen: NEGATIVE
Unit division: 0
Unit division: 0

## 2021-12-11 LAB — BPAM RBC
Blood Product Expiration Date: 202310022359
Blood Product Expiration Date: 202310022359
ISSUE DATE / TIME: 202309140300
Unit Type and Rh: 6200
Unit Type and Rh: 6200

## 2021-12-11 NOTE — Telephone Encounter (Signed)
Transition Care Management Follow-up Telephone Call Date of discharge and from where: 12/09/2021, [facility] How have you been since you were released from the hospital? She said she is just feeling " a little bad." Her throat is still sore.  Any questions or concerns? No  Items Reviewed: Did the pt receive and understand the discharge instructions provided? Yes  Medications obtained and verified? Yes - she said she has all of her medications and did not have any questions about the med regime  Other? No  Any new allergies since your discharge? No  Dietary orders reviewed? Yes Do you have support at home? Yes   Home Care and Equipment/Supplies: Were home health services ordered? yes If so, what is the name of the agency? [facility]   Has the agency set up a time to come to the patient's home? no Were any new equipment or medical supplies ordered?  No What is the name of the medical supply agency? N/a Were you able to get the supplies/equipment? not applicable Do you have any questions related to the use of the equipment or supplies? No  Functional Questionnaire: (I = Independent and D = Dependent) ADLs: independent with dressing/ personal care.  Ambulates with RW.    Follow up appointments reviewed:  PCP Hospital f/u appt confirmed? Yes  Scheduled to see Dr Redmond Pulling - 01/02/2022  Specialist Hospital f/u appt confirmed?  None scheduled at this time    Are transportation arrangements needed?  She said she may need transportation arranged.  The bus stop is too far away If their condition worsens, is the pt aware to call PCP or go to the Emergency Dept.? Yes Was the patient provided with contact information for the PCP's office or ED? Yes Was to pt encouraged to call back with questions or concerns? Yes

## 2021-12-12 ENCOUNTER — Telehealth: Payer: Self-pay | Admitting: Family Medicine

## 2021-12-12 LAB — CULTURE, BLOOD (SINGLE)
Culture: NO GROWTH
Special Requests: ADEQUATE

## 2021-12-12 NOTE — Telephone Encounter (Signed)
FYI

## 2021-12-12 NOTE — Telephone Encounter (Signed)
Copied from [place] [id number]. Topic: General - Other >> Dec 11, 2021  3:35 PM Marcellus Scott wrote: Reason for CRM: Rosa from [facility] stated they received a referral for home health PT when they reached out to pt to schedule.  Pt asked to be seen later in the week as she wasn't feeling well and wanted to get situated.   Rosa stated they would schedule pt for 12/15/21.

## 2021-12-13 LAB — MULTIPLE MYELOMA PANEL, SERUM
Albumin SerPl Elph-Mcnc: 3.3 g/dL (ref 2.9–4.4)
Albumin/Glob SerPl: 1.1 (ref 0.7–1.7)
Alpha 1: 0.4 g/dL (ref 0.0–0.4)
Alpha2 Glob SerPl Elph-Mcnc: 0.6 g/dL (ref 0.4–1.0)
B-Globulin SerPl Elph-Mcnc: 0.9 g/dL (ref 0.7–1.3)
Gamma Glob SerPl Elph-Mcnc: 1.3 g/dL (ref 0.4–1.8)
Globulin, Total: 3.1 g/dL (ref 2.2–3.9)
IgA: 172 mg/dL (ref 87–352)
IgG (Immunoglobin G), Serum: 1221 mg/dL (ref 586–1602)
IgM (Immunoglobulin M), Srm: 121 mg/dL (ref 26–217)
Total Protein ELP: 6.4 g/dL (ref 6.0–8.5)

## 2022-01-02 ENCOUNTER — Inpatient Hospital Stay: Payer: Self-pay | Admitting: Family Medicine

## 2022-01-23 ENCOUNTER — Other Ambulatory Visit (HOSPITAL_COMMUNITY): Payer: Self-pay

## 2022-01-28 ENCOUNTER — Encounter (HOSPITAL_COMMUNITY): Payer: Self-pay

## 2022-01-28 ENCOUNTER — Other Ambulatory Visit: Payer: Self-pay

## 2022-01-28 ENCOUNTER — Other Ambulatory Visit (HOSPITAL_COMMUNITY): Payer: Self-pay

## 2022-01-28 ENCOUNTER — Inpatient Hospital Stay (HOSPITAL_COMMUNITY)
Admission: EM | Admit: 2022-01-28 | Discharge: 2022-02-23 | DRG: 870 | Disposition: A | Discharge status: Hospice | Payer: Self-pay | Attending: Internal Medicine | Admitting: Internal Medicine

## 2022-01-28 DIAGNOSIS — E8809 Other disorders of plasma-protein metabolism, not elsewhere classified: Secondary | ICD-10-CM | POA: Diagnosis present

## 2022-01-28 DIAGNOSIS — E872 Acidosis, unspecified: Secondary | ICD-10-CM | POA: Diagnosis present

## 2022-01-28 DIAGNOSIS — J1569 Pneumonia due to other gram-negative bacteria: Secondary | ICD-10-CM | POA: Diagnosis present

## 2022-01-28 DIAGNOSIS — J9621 Acute and chronic respiratory failure with hypoxia: Secondary | ICD-10-CM | POA: Diagnosis present

## 2022-01-28 DIAGNOSIS — Z5901 Sheltered homelessness: Secondary | ICD-10-CM

## 2022-01-28 DIAGNOSIS — Z79899 Other long term (current) drug therapy: Secondary | ICD-10-CM

## 2022-01-28 DIAGNOSIS — Z515 Encounter for palliative care: Secondary | ICD-10-CM

## 2022-01-28 DIAGNOSIS — F419 Anxiety disorder, unspecified: Secondary | ICD-10-CM | POA: Diagnosis present

## 2022-01-28 DIAGNOSIS — L8990 Pressure ulcer of unspecified site, unspecified stage: Secondary | ICD-10-CM | POA: Insufficient documentation

## 2022-01-28 DIAGNOSIS — Z87891 Personal history of nicotine dependence: Secondary | ICD-10-CM

## 2022-01-28 DIAGNOSIS — R739 Hyperglycemia, unspecified: Secondary | ICD-10-CM | POA: Diagnosis not present

## 2022-01-28 DIAGNOSIS — J918 Pleural effusion in other conditions classified elsewhere: Secondary | ICD-10-CM | POA: Diagnosis present

## 2022-01-28 DIAGNOSIS — E162 Hypoglycemia, unspecified: Secondary | ICD-10-CM | POA: Diagnosis not present

## 2022-01-28 DIAGNOSIS — Z1152 Encounter for screening for COVID-19: Secondary | ICD-10-CM

## 2022-01-28 DIAGNOSIS — I5181 Takotsubo syndrome: Secondary | ICD-10-CM | POA: Diagnosis not present

## 2022-01-28 DIAGNOSIS — I5023 Acute on chronic systolic (congestive) heart failure: Secondary | ICD-10-CM | POA: Diagnosis present

## 2022-01-28 DIAGNOSIS — E43 Unspecified severe protein-calorie malnutrition: Secondary | ICD-10-CM | POA: Diagnosis present

## 2022-01-28 DIAGNOSIS — Z7951 Long term (current) use of inhaled steroids: Secondary | ICD-10-CM

## 2022-01-28 DIAGNOSIS — E876 Hypokalemia: Secondary | ICD-10-CM | POA: Diagnosis present

## 2022-01-28 DIAGNOSIS — R6521 Severe sepsis with septic shock: Secondary | ICD-10-CM | POA: Diagnosis present

## 2022-01-28 DIAGNOSIS — E861 Hypovolemia: Secondary | ICD-10-CM | POA: Diagnosis present

## 2022-01-28 DIAGNOSIS — D62 Acute posthemorrhagic anemia: Secondary | ICD-10-CM | POA: Diagnosis not present

## 2022-01-28 DIAGNOSIS — J440 Chronic obstructive pulmonary disease with acute lower respiratory infection: Secondary | ICD-10-CM | POA: Diagnosis present

## 2022-01-28 DIAGNOSIS — D61818 Other pancytopenia: Secondary | ICD-10-CM | POA: Diagnosis present

## 2022-01-28 DIAGNOSIS — N12 Tubulo-interstitial nephritis, not specified as acute or chronic: Secondary | ICD-10-CM | POA: Diagnosis present

## 2022-01-28 DIAGNOSIS — G9341 Metabolic encephalopathy: Secondary | ICD-10-CM | POA: Diagnosis present

## 2022-01-28 DIAGNOSIS — Z66 Do not resuscitate: Secondary | ICD-10-CM | POA: Diagnosis present

## 2022-01-28 DIAGNOSIS — I471 Supraventricular tachycardia, unspecified: Secondary | ICD-10-CM | POA: Diagnosis not present

## 2022-01-28 DIAGNOSIS — Y95 Nosocomial condition: Secondary | ICD-10-CM | POA: Diagnosis present

## 2022-01-28 DIAGNOSIS — A419 Sepsis, unspecified organism: Principal | ICD-10-CM

## 2022-01-28 DIAGNOSIS — E870 Hyperosmolality and hypernatremia: Secondary | ICD-10-CM | POA: Diagnosis present

## 2022-01-28 DIAGNOSIS — A4151 Sepsis due to Escherichia coli [E. coli]: Principal | ICD-10-CM | POA: Diagnosis present

## 2022-01-28 DIAGNOSIS — Z91148 Patient's other noncompliance with medication regimen for other reason: Secondary | ICD-10-CM

## 2022-01-28 DIAGNOSIS — N170 Acute kidney failure with tubular necrosis: Secondary | ICD-10-CM | POA: Diagnosis present

## 2022-01-28 DIAGNOSIS — F32A Depression, unspecified: Secondary | ICD-10-CM | POA: Diagnosis present

## 2022-01-28 DIAGNOSIS — I70263 Atherosclerosis of native arteries of extremities with gangrene, bilateral legs: Secondary | ICD-10-CM | POA: Diagnosis not present

## 2022-01-28 DIAGNOSIS — Z681 Body mass index (BMI) 19 or less, adult: Secondary | ICD-10-CM

## 2022-01-28 DIAGNOSIS — L89312 Pressure ulcer of right buttock, stage 2: Secondary | ICD-10-CM | POA: Diagnosis not present

## 2022-01-28 DIAGNOSIS — I110 Hypertensive heart disease with heart failure: Secondary | ICD-10-CM | POA: Diagnosis present

## 2022-01-28 DIAGNOSIS — R570 Cardiogenic shock: Secondary | ICD-10-CM | POA: Diagnosis present

## 2022-01-28 DIAGNOSIS — E873 Alkalosis: Secondary | ICD-10-CM | POA: Diagnosis present

## 2022-01-28 DIAGNOSIS — K922 Gastrointestinal hemorrhage, unspecified: Secondary | ICD-10-CM | POA: Diagnosis not present

## 2022-01-28 DIAGNOSIS — G9340 Encephalopathy, unspecified: Secondary | ICD-10-CM

## 2022-01-28 DIAGNOSIS — N200 Calculus of kidney: Secondary | ICD-10-CM | POA: Diagnosis present

## 2022-01-28 LAB — CBC WITH DIFFERENTIAL/PLATELET
Abs Immature Granulocytes: 0.21 10*3/uL — ABNORMAL HIGH (ref 0.00–0.07)
Basophils Absolute: 0 10*3/uL (ref 0.0–0.1)
Basophils Relative: 0 %
Eosinophils Absolute: 0 10*3/uL (ref 0.0–0.5)
Eosinophils Relative: 1 %
HCT: 28.6 % — ABNORMAL LOW (ref 36.0–46.0)
Hemoglobin: 9.3 g/dL — ABNORMAL LOW (ref 12.0–15.0)
Lymphocytes Relative: 13 %
Lymphs Abs: 0.2 10*3/uL — ABNORMAL LOW (ref 0.7–4.0)
MCH: 20.7 pg — ABNORMAL LOW (ref 26.0–34.0)
MCHC: 32.5 g/dL (ref 30.0–36.0)
MCV: 63.7 fL — ABNORMAL LOW (ref 80.0–100.0)
Monocytes Absolute: 0 10*3/uL — ABNORMAL LOW (ref 0.1–1.0)
Monocytes Relative: 1 %
Neutro Abs: 0.8 10*3/uL — ABNORMAL LOW (ref 1.7–7.7)
Neutrophils Relative %: 85 %
Platelets: 70 10*3/uL — ABNORMAL LOW (ref 150–400)
RBC: 4.49 MIL/uL (ref 3.87–5.11)
RDW: 24.3 % — ABNORMAL HIGH (ref 11.5–15.5)
WBC: 1.3 10*3/uL — CL (ref 4.0–10.5)
nRBC: 0 % (ref 0.0–0.2)

## 2022-01-28 LAB — COMPREHENSIVE METABOLIC PANEL
ALT: 27 U/L (ref 0–44)
AST: 52 U/L — ABNORMAL HIGH (ref 15–41)
Albumin: 3.4 g/dL — ABNORMAL LOW (ref 3.5–5.0)
Alkaline Phosphatase: 159 U/L — ABNORMAL HIGH (ref 38–126)
Anion gap: 23 — ABNORMAL HIGH (ref 5–15)
BUN: 51 mg/dL — ABNORMAL HIGH (ref 8–23)
CO2: 14 mmol/L — ABNORMAL LOW (ref 22–32)
Calcium: 10.2 mg/dL (ref 8.9–10.3)
Chloride: 102 mmol/L (ref 98–111)
Creatinine, Ser: 4.13 mg/dL — ABNORMAL HIGH (ref 0.44–1.00)
GFR, Estimated: 12 mL/min — ABNORMAL LOW (ref >60)
Glucose, Bld: 90 mg/dL (ref 70–99)
Potassium: 3.6 mmol/L (ref 3.5–5.1)
Sodium: 139 mmol/L (ref 135–145)
Total Bilirubin: 1.5 mg/dL — ABNORMAL HIGH (ref 0.3–1.2)
Total Protein: 6.9 g/dL (ref 6.5–8.1)

## 2022-01-28 LAB — RESP PANEL BY RT-PCR (FLU A&B, COVID) ARPGX2
Influenza A by PCR: NEGATIVE
Influenza B by PCR: NEGATIVE
SARS Coronavirus 2 by RT PCR: NEGATIVE

## 2022-01-28 LAB — PROTIME-INR
INR: 1.8 — ABNORMAL HIGH (ref 0.8–1.2)
Prothrombin Time: 20.3 seconds — ABNORMAL HIGH (ref 11.4–15.2)

## 2022-01-28 LAB — TROPONIN I (HIGH SENSITIVITY): Troponin I (High Sensitivity): 64 ng/L — ABNORMAL HIGH (ref <18)

## 2022-01-28 LAB — LACTIC ACID, PLASMA: Lactic Acid, Venous: 8.5 mmol/L (ref 0.5–1.9)

## 2022-01-28 LAB — APTT: aPTT: 35 seconds (ref 24–36)

## 2022-01-28 MED ORDER — LACTATED RINGERS IV SOLN: INTRAVENOUS | Status: AC

## 2022-01-28 MED ORDER — LACTATED RINGERS IV BOLUS (SEPSIS)
250.0000 mL | Freq: Once | INTRAVENOUS | Status: AC
  Administered 2022-01-28: 250 mL via INTRAVENOUS

## 2022-01-28 MED ORDER — METRONIDAZOLE 500 MG/100ML IV SOLN
500.0000 mg | Freq: Once | INTRAVENOUS | Status: AC
  Administered 2022-01-28: 500 mg via INTRAVENOUS
  Filled 2022-01-28: qty 100

## 2022-01-28 MED ORDER — SODIUM CHLORIDE 0.9 % IV SOLN
2.0000 g | Freq: Once | INTRAVENOUS | Status: AC
  Administered 2022-01-28: 2 g via INTRAVENOUS
  Filled 2022-01-28: qty 12.5

## 2022-01-28 MED ORDER — LACTATED RINGERS IV BOLUS (SEPSIS)
1000.0000 mL | Freq: Once | INTRAVENOUS | Status: AC
  Administered 2022-01-28: 1000 mL via INTRAVENOUS

## 2022-01-28 MED ORDER — VANCOMYCIN HCL IN DEXTROSE 1-5 GM/200ML-% IV SOLN
1000.0000 mg | Freq: Once | INTRAVENOUS | Status: AC
  Administered 2022-01-29: 1000 mg via INTRAVENOUS
  Filled 2022-01-28: qty 200

## 2022-01-28 NOTE — ED Notes (Signed)
Bed bugs found on pt. Pt cleaned and new linens provided.

## 2022-01-28 NOTE — ED Provider Notes (Signed)
[facility] EMERGENC[facility] DEPARTMENT Provider Note   CSN: [id number] Arrival date & time: 01/28/22  2208     History Chief Complaint  Patient presents with   Code Sepsis   Patient with Bedbugs.  Nicole Cunningham is a 61 y.o. female who presents via EMS from a hotel where she is living with her family.  Per family she has been really confused today, usually alert and oriented x4 and able to perform daily acts of living.  Per family some shortness of breath last couple of days.  Patient ANO x0 at time of her arrival.  She is speaking but nonsensically. Level 5 caveat 2/2 to acuity of presentation on arrival.  I personally reviewed her medical history of MDD, hypertension, COPD, malnutrition, anemia requiring multiple transfusions, depression. Patient admitted in 11/2021 with sepsis secondary to E.coli UTI with pyelonephritis.  HPI     Home Medications Prior to Admission medications   Medication Sig Start Date End Date Taking? Authorizing Provider  albuterol (VENTOLIN HFA) 108 (90 Base) MCG/ACT inhaler Inhale 1-2 puffs into the lungs every 6 (six) hours as needed for wheezing or shortness of breath. 10/12/20   Camillia Herter, NP  ferrous sulfate 325 (65 FE) MG tablet Take 1 tablet (325 mg total) by mouth 2 (two) times daily with a meal. 12/09/21   Barb Merino, MD  FLUoxetine (PROZAC) 40 MG capsule Take 2 capsules (80 mg total) by mouth daily. 05/18/21   Nwoko, Terese Door, PA  fluticasone furoate-vilanterol (BREO ELLIPTA) 200-25 MCG/ACT AEPB inhale 1 puff into the lungs daily 10/12/20   Camillia Herter, NP  gabapentin (NEURONTIN) 400 MG capsule Take 1 capsule (400 mg total) by mouth at bedtime. 05/18/21   Nwoko, Terese Door, PA  mirtazapine (REMERON) 30 MG tablet Take 1 tablet (30 mg total) by mouth at bedtime. 05/18/21   Nwoko, Terese Door, PA  potassium chloride SA (KLOR-CON M) 20 MEQ tablet Take 2 tablets (40 mEq total) by mouth daily for 3 days. 12/09/21 12/14/21  Barb Merino, MD  QUEtiapine (SEROQUEL) 300 MG tablet Take 1 tablet (300 mg total) by mouth at bedtime. 12/09/21   Barb Merino, MD  Ipratropium-Albuterol (COMBIVENT RESPIMAT) 20-100 MCG/ACT AERS respimat Inhale 1 puff into the lungs every 6 (six) hours. Patient not taking: No sig reported 12/15/19 04/22/20  Elsie Stain, MD      Allergies    Patient has no known allergies.    Review of Systems   Review of Systems  Unable to perform ROS: Mental status change    Physical Exam Updated Vital Signs BP (!) 84/61   Pulse (!) 143   Temp (!) 102 F (38.9 C) (Oral)   Resp (!) 22   Ht 5\' 4"  (1.626 m)   Wt 40.8 kg   SpO2 100%   BMI 15.45 kg/m  Physical Exam Vitals and nursing note reviewed. Exam conducted with a chaperone present.  Constitutional:      Appearance: She is toxic-appearing.  HENT:     Head: Normocephalic and atraumatic.     Mouth/Throat:     Mouth: Mucous membranes are dry.     Pharynx: No oropharyngeal exudate or posterior oropharyngeal erythema.  Eyes:     General: Lids are normal. Vision grossly intact.        Right eye: No discharge.        Left eye: No discharge.     Extraocular Movements: Extraocular movements intact.     Conjunctiva/sclera:  Conjunctivae normal.     Pupils: Pupils are equal, round, and reactive to light.  Cardiovascular:     Rate and Rhythm: Regular rhythm. Tachycardia present.     Pulses: Normal pulses.     Heart sounds: Normal heart sounds. No murmur heard. Pulmonary:     Effort: Pulmonary effort is normal. No tachypnea, bradypnea, accessory muscle usage or respiratory distress.     Breath sounds: Normal breath sounds. No wheezing or rales.  Chest:     Chest wall: No mass, lacerations, deformity, swelling, tenderness, crepitus or edema.  Abdominal:     General: Bowel sounds are normal. There is no distension.     Palpations: Abdomen is soft.     Tenderness: There is no abdominal tenderness. There is no guarding or rebound.   Genitourinary:    Comments: Foul smelling, purulent drainage from the urethra  Musculoskeletal:        General: No deformity.     Cervical back: Normal range of motion and neck supple.     Right lower leg: No edema.     Left lower leg: No edema.  Skin:    General: Skin is warm and dry.     Capillary Refill: Capillary refill takes less than 2 seconds.  Neurological:     General: No focal deficit present.     Mental Status: She is alert and oriented to person, place, and time. Mental status is at baseline.  Psychiatric:        Mood and Affect: Mood normal.     ED Results / Procedures / Treatments   Labs (all labs ordered are listed, but only abnormal results are displayed) Labs Reviewed  LACTIC ACID, PLASMA - Abnormal; Notable for the following components:      Result Value   Lactic Acid, Venous 8.5 (*)    All other components within normal limits  LACTIC ACID, PLASMA - Abnormal; Notable for the following components:   Lactic Acid, Venous >9.0 (*)    All other components within normal limits  COMPREHENSIVE METABOLIC PANEL - Abnormal; Notable for the following components:   CO2 14 (*)    BUN 51 (*)    Creatinine, Ser 4.13 (*)    Albumin 3.4 (*)    AST 52 (*)    Alkaline Phosphatase 159 (*)    Total Bilirubin 1.5 (*)    GFR, Estimated 12 (*)    Anion gap 23 (*)    All other components within normal limits  CBC WITH DIFFERENTIAL/PLATELET - Abnormal; Notable for the following components:   WBC 1.3 (*)    Hemoglobin 9.3 (*)    HCT 28.6 (*)    MCV 63.7 (*)    MCH 20.7 (*)    RDW 24.3 (*)    Platelets 70 (*)    Neutro Abs 0.8 (*)    Lymphs Abs 0.2 (*)    Monocytes Absolute 0.0 (*)    Abs Immature Granulocytes 0.21 (*)    All other components within normal limits  PROTIME-INR - Abnormal; Notable for the following components:   Prothrombin Time 20.3 (*)    INR 1.8 (*)    All other components within normal limits  URINALYSIS, ROUTINE W REFLEX MICROSCOPIC - Abnormal;  Notable for the following components:   Hgb urine dipstick LARGE (*)    Ketones, ur 15 (*)    Protein, ur 100 (*)    Leukocytes,Ua MODERATE (*)    All other components within normal limits  URINALYSIS, MICROSCOPIC (  REFLEX) - Abnormal; Notable for the following components:   Bacteria, UA RARE (*)    All other components within normal limits  TROPONIN I (HIGH SENSITIVITY) - Abnormal; Notable for the following components:   Troponin I (High Sensitivity) 64 (*)    All other components within normal limits  TROPONIN I (HIGH SENSITIVITY) - Abnormal; Notable for the following components:   Troponin I (High Sensitivity) 80 (*)    All other components within normal limits  RESP PANEL BY RT-PCR (FLU A&B, COVID) ARPGX2  CULTURE, BLOOD (ROUTINE X 2)  URINE CULTURE  CULTURE, BLOOD (ROUTINE X 2) W REFLEX TO ID PANEL  MRSA NEXT GEN BY PCR, NASAL  APTT  CBC  CREATININE, SERUM  MAGNESIUM  COMPREHENSIVE METABOLIC PANEL  LACTIC ACID, PLASMA  LACTIC ACID, PLASMA  PHOSPHORUS  PROCALCITONIN  CORTISOL  CBC WITH DIFFERENTIAL/PLATELET  RAPID URINE DRUG SCREEN, HOSP PERFORMED  SALICYLATE LEVEL  ETHANOL  ACETAMINOPHEN LEVEL  TSH  AMMONIA  CK  BLOOD GAS, VENOUS    EKG EKG Interpretation  Date/Time:  Sunday January 28 2022 22:14:19 EST Ventricular Rate:  146 PR Interval:  129 QRS Duration: 80 QT Interval:  336 QTC Calculation: 524 R Axis:   80 Text Interpretation: Sinus tachycardia Anteroseptal infarct, old Prolonged QT interval Baseline wander in lead(s) III Confirmed by Kennis Carina [phone]) on 01/28/2022 11:06:14 PM  Radiology CT HEAD WO CONTRAST ( )  Result Date: 01/29/2022 CLINICAL DATA:  Confusion/delirium EXAM: CT HEAD WITHOUT CONTRAST TECHNIQUE: Contiguous axial images were obtained from the base of the skull through the vertex without intravenous contrast. RADIATION DOSE REDUCTION: This exam was performed according to the departmental dose-optimization program which includes  automated exposure control, adjustment of the mA and/or kV according to patient size and/or use of iterative reconstruction technique. COMPARISON:  12/06/2021 FINDINGS: Brain: No evidence of acute infarction, hemorrhage, hydrocephalus, extra-axial collection or mass lesion/mass effect. Mild subcortical white matter and periventricular small vessel ischemic changes. Vascular: No hyperdense vessel or unexpected calcification. Skull: Normal. Negative for fracture or focal lesion. Sinuses/Orbits: The visualized paranasal sinuses are essentially clear. The mastoid air cells are unopacified. Other: None. IMPRESSION: No evidence of acute intracranial abnormality. Mild small vessel ischemic changes. Electronically Signed   By: Charline Bills M.D.   On: 01/29/2022 01:08   CT ABDOMEN PELVIS WO CONTRAST  Result Date: 01/29/2022 CLINICAL DATA:  Sepsis EXAM: CT ABDOMEN AND PELVIS WITHOUT CONTRAST TECHNIQUE: Multidetector CT imaging of the abdomen and pelvis was performed following the standard protocol without IV contrast. RADIATION DOSE REDUCTION: This exam was performed according to the departmental dose-optimization program which includes automated exposure control, adjustment of the mA and/or kV according to patient size and/or use of iterative reconstruction technique. COMPARISON:  12/06/2021 FINDINGS: Motion degraded images. Lower chest: 5 mm right lower lobe pulmonary nodule (series 5/image 2), grossly unchanged when accounting for motion degradation. No follow-up is recommended. Hepatobiliary: Liver is notable for mild hepatic steatosis. Cholelithiasis.  No intrahepatic or extrahepatic duct dilatation. Pancreas: Grossly unremarkable. Spleen: Within normal limits. Adrenals/Urinary Tract: Adrenal glands are within normal limits. 17 mm nonobstructing left lower pole renal calculus. Kidneys are otherwise grossly unremarkable. No hydronephrosis. Bladder is underdistended but unremarkable. Stomach/Bowel: Stomach is  notable for a small hiatal hernia. No evidence of bowel obstruction. Appendix is grossly unremarkable (series 3/image 43). Moderate left colonic stool burden, suggesting constipation. Vascular/Lymphatic: No evidence of abdominal aortic aneurysm. No suspicious abdominopelvic lymphadenopathy. Reproductive: Calcified uterine fibroids. No adnexal masses. Other: No abdominopelvic ascites. Musculoskeletal:  Visualized osseous structures are within normal limits. IMPRESSION: Motion degraded images. 17 mm nonobstructing left lower pole renal calculus. No hydronephrosis. Additional stable ancillary findings as above. Electronically Signed   By: Charline Bills M.D.   On: 01/29/2022 01:07   DG Chest Port 1 View  Result Date: 01/29/2022 CLINICAL DATA:  Code sepsis. EXAM: PORTABLE CHEST 1 VIEW COMPARISON:  December 06, 2021 FINDINGS: The heart size and mediastinal contours are within normal limits. The lungs are hyperinflated with mild biapical atelectasis and pleural thickening. There is no evidence of an acute infiltrate, pleural effusion or pneumothorax. The visualized skeletal structures are unremarkable. IMPRESSION: COPD without acute or active cardiopulmonary disease. Electronically Signed   By: Aram Candela M.D.   On: 01/29/2022 00:46    Procedures .Critical Care  Performed by: Paris Lore, PA-C Authorized by: Paris Lore, PA-C   Critical care provider statement:    Critical care time (minutes):  45   Critical care was time spent personally by me on the following activities:  Development of treatment plan with patient or surrogate, discussions with consultants, evaluation of patient's response to treatment, examination of patient, obtaining history from patient or surrogate, ordering and performing treatments and interventions, ordering and review of laboratory studies, ordering and review of radiographic studies, pulse oximetry and re-evaluation of patient's condition      Medications Ordered in ED Medications  lactated ringers infusion ( Intravenous New Bag/Given 01/28/22 2315)  vancomycin variable dose per unstable renal function (pharmacist dosing) (has no administration in time range)  lactated ringers bolus 1,000 mL (has no administration in time range)  0.9 %  sodium chloride infusion (has no administration in time range)  norepinephrine (LEVOPHED) 4mg  in (0.016 mg/mL) premix infusion (has no administration in time range)  heparin injection 5,000 Units (has no administration in time range)  ipratropium-albuterol (DUONEB) 0.5-2.5 (3) MG/3ML nebulizer solution 3 mL (has no administration in time range)  budesonide (PULMICORT) nebulizer solution 0.25 mg (has no administration in time range)  arformoterol (BROVANA) nebulizer solution 15 mcg (has no administration in time range)  cefTRIAXone (ROCEPHIN) 2 g in sodium chloride 0.9 % 100 mL IVPB (has no administration in time range)  lactated ringers bolus 1,000 mL (0 mLs Intravenous Stopped 01/29/22 0021)    And  lactated ringers bolus 250 mL (0 mLs Intravenous Stopped 01/28/22 2324)  ceFEPIme (MAXIPIME) 2 g in sodium chloride 0.9 % 100 mL IVPB (0 g Intravenous Stopped 01/28/22 2350)  metroNIDAZOLE (FLAGYL) IVPB 500 mg (0 mg Intravenous Stopped 01/29/22 0021)  vancomycin (VANCOCIN) IVPB 1000 mg/200 mL premix (0 mg Intravenous Stopped 01/29/22 0150)  acetaminophen (TYLENOL) suppository 650 mg (650 mg Rectal Given 01/29/22 0149)  sodium chloride 0.9 % bolus 1,000 mL (1,000 mLs Intravenous New Bag/Given 01/29/22 0150)    ED Course/ Medical Decision Making/ A&P                           Medical Decision Making 61 year old female who presents with concern for code sepsis, AMS.   Tachycardic, tachypneic on intake. Cardiac exam as above, pulmonary exam unremarkable. Abdominal exam benign, no visible wounds on skin exam.   The differential diagnosis for AMS is extensive and includes, but is not limited to:   Drug overdose - opioids, alcohol, sedatives, antipsychotics, drug withdrawal, others Metabolic: hypoxia, hypoglycemia, hyperglycemia, hypercalcemia, hypernatremia, hyponatremia, uremia, hepatic encephalopathy, hypothyroidism, hyperthyroidism, vitamin B12 or thiamine deficiency, carbon monoxide poisoning, Wilson's disease,  Lactic acidosis, DKA/HHOS Infectious: meningitis, encephalitis, bacteremia/sepsis, urinary tract infection, pneumonia, neurosyphilis Structural: Space-occupying lesion, (brain tumor, subdural hematoma, hydrocephalus,) Vascular: stroke, subarachnoid hemorrhage, coronary ischemia, hypertensive encephalopathy, CNS vasculitis, thrombotic thrombocytopenic purpura, disseminated intravascular coagulation, hyperviscosity Psychiatric: Schizophrenia, depression; Other: Seizure, hypothermia, heat stroke, ICU psychosis, dementia -"sundowning."  bleeding and bleeding    Amount and/or Complexity of Data Reviewed Labs: ordered.    Details: CBC with leukopenia of 1.3, anemia with hemoglobin of 9.3.  CMP with AKI with creatinine of 4.1, elevated total bili 1.5.  Baseline creatinine near 1.  Troponin elevated to 64, lactic acid exquisitely elevated to 8.5.  INR elevated.  RVP negative.   Unsuccessful initial in and out catheterization attempt. Second lactate elevated to greater than 9.  Troponin climbing, 80.  Radiology: ordered.    Details:   Chest x-ray without acute cardiopulmonary disease, visualized by this provider. CT abdomen pelvis Negative for acute intra-abdominal abnormality. ECG/medicine tests: ordered.  Risk OTC drugs. Prescription drug management. Decision regarding hospitalization.    Patient responding poorly to IV fluids.    Patient started on vasopressors, admitted to ICU by EDP Dr. Pilar Plate for severe sepsis with septic shock unclear etiology. I appreciate his collaboration in the care of this patient.   This chart was dictated using voice recognition software, Dragon.  Despite the best efforts of this provider to proofread and correct errors, errors may still occur which can change documentation meaning.   Final Clinical Impression(s) / ED Diagnoses Final diagnoses:  Sepsis, due to unspecified organism, unspecified whether acute organ dysfunction present [facility])    Rx / DC Orders ED Discharge Orders     None         Paris Lore, PA-C 01/29/22 0302    Sabas Sous, MD 02/10/22 [phone]

## 2022-01-28 NOTE — ED Notes (Signed)
Moved from EMS stretched. Placed in new bed. Bed bugs present. New linens provided. Pt cleaned and washed as she had a bowel movement and urinated on self.

## 2022-01-28 NOTE — ED Provider Notes (Incomplete)
[facility] EMERGENCY DEPARTMENT Provider Note   CSN: [id number] Arrival date & time: 01/28/22  2208     History {Add pertinent medical, surgical, social history, OB history to HPI:1} No chief complaint on file.  Patient with Bedbugs.  Nicole Cunningham is a 61 y.o. female who presents via EMS from a hotel where she is living with her family.  Per family she has been really confused today, usually alert and oriented x4 and able to perform daily acts of living.  Per family some shortness of breath last couple of days.  Patient ANO x0 at time of her arrival.  She is speaking but nonsensically. Level 5 caveat 2/2 to acuity of presentation on arrival.  I personally reviewed her medical history of MDD, hypertension, COPD, malnutrition, anemia requiring multiple transfusions, depression. Patient admitted in 11/2021 with sepsis secondary to E.coli UTI with pyelonephritis.  HPI     Home Medications Prior to Admission medications   Medication Sig Start Date End Date Taking? Authorizing Provider  albuterol (VENTOLIN HFA) 108 (90 Base) MCG/ACT inhaler Inhale 1-2 puffs into the lungs every 6 (six) hours as needed for wheezing or shortness of breath. 10/12/20   Camillia Herter, NP  ferrous sulfate 325 (65 FE) MG tablet Take 1 tablet (325 mg total) by mouth 2 (two) times daily with a meal. 12/09/21   Barb Merino, MD  FLUoxetine (PROZAC) 40 MG capsule Take 2 capsules (80 mg total) by mouth daily. 05/18/21   Nwoko, Terese Door, PA  fluticasone furoate-vilanterol (BREO ELLIPTA) 200-25 MCG/ACT AEPB inhale 1 puff into the lungs daily 10/12/20   Camillia Herter, NP  gabapentin (NEURONTIN) 400 MG capsule Take 1 capsule (400 mg total) by mouth at bedtime. 05/18/21   Nwoko, Terese Door, PA  mirtazapine (REMERON) 30 MG tablet Take 1 tablet (30 mg total) by mouth at bedtime. 05/18/21   Nwoko, Terese Door, PA  potassium chloride SA (KLOR-CON M) 20 MEQ tablet Take 2 tablets (40 mEq total) by mouth  daily for 3 days. 12/09/21 12/14/21  Barb Merino, MD  QUEtiapine (SEROQUEL) 300 MG tablet Take 1 tablet (300 mg total) by mouth at bedtime. 12/09/21   Barb Merino, MD  Ipratropium-Albuterol (COMBIVENT RESPIMAT) 20-100 MCG/ACT AERS respimat Inhale 1 puff into the lungs every 6 (six) hours. Patient not taking: No sig reported 12/15/19 04/22/20  Elsie Stain, MD      Allergies    Patient has no known allergies.    Review of Systems   Review of Systems  Unable to perform ROS: Mental status change    Physical Exam Updated Vital Signs BP (!) 98/59   Pulse (!) 146   Temp 97.8 F (36.6 C) (Oral)   Resp (!) 25   Ht 5\' 4"  (1.626 m)   Wt 40.8 kg   SpO2 100%   BMI 15.45 kg/m  Physical Exam Vitals and nursing note reviewed. Exam conducted with a chaperone present.  Constitutional:      Appearance: She is toxic-appearing.  HENT:     Head: Normocephalic and atraumatic.     Mouth/Throat:     Mouth: Mucous membranes are dry.     Pharynx: No oropharyngeal exudate or posterior oropharyngeal erythema.  Eyes:     General: Lids are normal. Vision grossly intact.        Right eye: No discharge.        Left eye: No discharge.     Extraocular Movements: Extraocular movements intact.  Conjunctiva/sclera: Conjunctivae normal.     Pupils: Pupils are equal, round, and reactive to light.  Cardiovascular:     Rate and Rhythm: Regular rhythm. Tachycardia present.     Pulses: Normal pulses.     Heart sounds: Normal heart sounds. No murmur heard. Pulmonary:     Effort: Pulmonary effort is normal. No tachypnea, bradypnea, accessory muscle usage or respiratory distress.     Breath sounds: Normal breath sounds. No wheezing or rales.  Chest:     Chest wall: No mass, lacerations, deformity, swelling, tenderness, crepitus or edema.  Abdominal:     General: Bowel sounds are normal. There is no distension.     Palpations: Abdomen is soft.     Tenderness: There is no abdominal tenderness. There  is no guarding or rebound.  Genitourinary:    Comments: Foul smelling, purulent drainage from the urethra  Musculoskeletal:        General: No deformity.     Cervical back: Normal range of motion and neck supple.     Right lower leg: No edema.     Left lower leg: No edema.  Skin:    General: Skin is warm and dry.     Capillary Refill: Capillary refill takes less than 2 seconds.  Neurological:     General: No focal deficit present.     Mental Status: She is alert and oriented to person, place, and time. Mental status is at baseline.  Psychiatric:        Mood and Affect: Mood normal.     ED Results / Procedures / Treatments   Labs (all labs ordered are listed, but only abnormal results are displayed) Labs Reviewed  RESP PANEL BY RT-PCR (FLU A&B, COVID) ARPGX2  CULTURE, BLOOD (ROUTINE X 2)  CULTURE, BLOOD (ROUTINE X 2)  URINE CULTURE  LACTIC ACID, PLASMA  LACTIC ACID, PLASMA  COMPREHENSIVE METABOLIC PANEL  CBC WITH DIFFERENTIAL/PLATELET  PROTIME-INR  APTT  URINALYSIS, ROUTINE W REFLEX MICROSCOPIC  TROPONIN I (HIGH SENSITIVITY)    EKG None  Radiology No results found.  Procedures Procedures  {Document cardiac monitor, telemetry assessment procedure when appropriate:1}  Medications Ordered in ED Medications  lactated ringers infusion (has no administration in time range)  lactated ringers bolus 1,000 mL (has no administration in time range)    And  lactated ringers bolus 250 mL (has no administration in time range)  ceFEPIme (MAXIPIME) 2 g in sodium chloride 0.9 % 100 mL IVPB (has no administration in time range)  metroNIDAZOLE (FLAGYL) IVPB 500 mg (has no administration in time range)  vancomycin (VANCOCIN) IVPB 1000 mg/200 mL premix (has no administration in time range)    ED Course/ Medical Decision Making/ A&P                           Medical Decision Making -year-old female presents via EMS, code sepsis,.  61 year old female who presents with concern  for code sepsis, AMS.   Tachycardic, tachypneic on intake. Cardiac exam as above, pulmonary exam unremarkable. Abdominal exam benign, no visible wounds on skin exam.   The differential diagnosis for AMS is extensive and includes, but is not limited to:  Drug overdose - opioids, alcohol, sedatives, antipsychotics, drug withdrawal, others Metabolic: hypoxia, hypoglycemia, hyperglycemia, hypercalcemia, hypernatremia, hyponatremia, uremia, hepatic encephalopathy, hypothyroidism, hyperthyroidism, vitamin B12 or thiamine deficiency, carbon monoxide poisoning, Wilson's disease, Lactic acidosis, DKA/HHOS Infectious: meningitis, encephalitis, bacteremia/sepsis, urinary tract infection, pneumonia, neurosyphilis Structural: Space-occupying lesion, (  brain tumor, subdural hematoma, hydrocephalus,) Vascular: stroke, subarachnoid hemorrhage, coronary ischemia, hypertensive encephalopathy, CNS vasculitis, thrombotic thrombocytopenic purpura, disseminated intravascular coagulation, hyperviscosity Psychiatric: Schizophrenia, depression; Other: Seizure, hypothermia, heat stroke, ICU psychosis, dementia -"sundowning."  bleeding and bleeding    Amount and/or Complexity of Data Reviewed Labs: ordered. Radiology: ordered. ECG/medicine tests: ordered.  Risk Prescription drug management. Decision regarding hospitalization.   ***  {Document critical care time when appropriate:1} {Document review of labs and clinical decision tools ie heart score, Chads2Vasc2 etc:1}  {Document your independent review of radiology images, and any outside records:1} {Document your discussion with family members, caretakers, and with consultants:1} {Document social determinants of health affecting pt's care:1} {Document your decision making why or why not admission, treatments were needed:1} Final Clinical Impression(s) / ED Diagnoses Final diagnoses:  None    Rx / DC Orders ED Discharge Orders     None

## 2022-01-28 NOTE — ED Notes (Signed)
In and out catheter attempted with no output.   Mittens paced for confusion and pulling on wires, and iv tubing.

## 2022-01-28 NOTE — Progress Notes (Signed)
Pt being followed by [facility] for Sepsis protocol. 

## 2022-01-28 NOTE — ED Triage Notes (Signed)
Arrives EMS from hotel where she is staying with family.   They report pt has been confused today and not acting like normal. Usually a/ox4. Has had some shob for last couple days.

## 2022-01-29 ENCOUNTER — Inpatient Hospital Stay (HOSPITAL_COMMUNITY): Payer: Self-pay

## 2022-01-29 ENCOUNTER — Emergency Department (HOSPITAL_COMMUNITY): Payer: Self-pay

## 2022-01-29 ENCOUNTER — Inpatient Hospital Stay: Payer: Self-pay

## 2022-01-29 DIAGNOSIS — R6521 Severe sepsis with septic shock: Secondary | ICD-10-CM

## 2022-01-29 DIAGNOSIS — J9601 Acute respiratory failure with hypoxia: Secondary | ICD-10-CM

## 2022-01-29 DIAGNOSIS — E872 Acidosis, unspecified: Secondary | ICD-10-CM

## 2022-01-29 DIAGNOSIS — N179 Acute kidney failure, unspecified: Secondary | ICD-10-CM

## 2022-01-29 DIAGNOSIS — G9340 Encephalopathy, unspecified: Secondary | ICD-10-CM

## 2022-01-29 DIAGNOSIS — A419 Sepsis, unspecified organism: Secondary | ICD-10-CM | POA: Diagnosis present

## 2022-01-29 DIAGNOSIS — I428 Other cardiomyopathies: Secondary | ICD-10-CM

## 2022-01-29 LAB — PREPARE RBC (CROSSMATCH)

## 2022-01-29 LAB — URINALYSIS, ROUTINE W REFLEX MICROSCOPIC
Bilirubin Urine: NEGATIVE
Glucose, UA: NEGATIVE mg/dL
Ketones, ur: 15 mg/dL — AB
Nitrite: NEGATIVE
Protein, ur: 100 mg/dL — AB
Specific Gravity, Urine: 1.02 (ref 1.005–1.030)
pH: 5.5 (ref 5.0–8.0)

## 2022-01-29 LAB — POCT I-STAT 7, (LYTES, BLD GAS, ICA,H+H)
Acid-base deficit: 9 mmol/L — ABNORMAL HIGH (ref 0.0–2.0)
Acid-base deficit: 12 mmol/L — ABNORMAL HIGH (ref 0.0–2.0)
Acid-base deficit: 11 mmol/L — ABNORMAL HIGH (ref 0.0–2.0)
Bicarbonate: 15.9 mmol/L — ABNORMAL LOW (ref 20.0–28.0)
Bicarbonate: 12.2 mmol/L — ABNORMAL LOW (ref 20.0–28.0)
Bicarbonate: 13.3 mmol/L — ABNORMAL LOW (ref 20.0–28.0)
Calcium, Ion: 1.03 mmol/L — ABNORMAL LOW (ref 1.15–1.40)
Calcium, Ion: 1.1 mmol/L — ABNORMAL LOW (ref 1.15–1.40)
Calcium, Ion: 1.03 mmol/L — ABNORMAL LOW (ref 1.15–1.40)
HCT: 19 % — ABNORMAL LOW (ref 36.0–46.0)
HCT: 19 % — ABNORMAL LOW (ref 36.0–46.0)
HCT: 19 % — ABNORMAL LOW (ref 36.0–46.0)
Hemoglobin: 6.5 g/dL — CL (ref 12.0–15.0)
Hemoglobin: 6.5 g/dL — CL (ref 12.0–15.0)
Hemoglobin: 6.5 g/dL — CL (ref 12.0–15.0)
O2 Saturation: 100 %
O2 Saturation: 97 %
O2 Saturation: 99 %
Patient temperature: 97.7
Patient temperature: 98.2
Potassium: 3.2 mmol/L — ABNORMAL LOW (ref 3.5–5.1)
Potassium: 3.7 mmol/L (ref 3.5–5.1)
Potassium: 3.4 mmol/L — ABNORMAL LOW (ref 3.5–5.1)
Sodium: 137 mmol/L (ref 135–145)
Sodium: 135 mmol/L (ref 135–145)
Sodium: 137 mmol/L (ref 135–145)
TCO2: 17 mmol/L — ABNORMAL LOW (ref 22–32)
TCO2: 13 mmol/L — ABNORMAL LOW (ref 22–32)
TCO2: 14 mmol/L — ABNORMAL LOW (ref 22–32)
pCO2 arterial: 29.4 mmHg — ABNORMAL LOW (ref 32–48)
pCO2 arterial: 21.3 mmHg — ABNORMAL LOW (ref 32–48)
pCO2 arterial: 22.1 mmHg — ABNORMAL LOW (ref 32–48)
pH, Arterial: 7.338 — ABNORMAL LOW (ref 7.35–7.45)
pH, Arterial: 7.367 (ref 7.35–7.45)
pH, Arterial: 7.387 (ref 7.35–7.45)
pO2, Arterial: 213 mmHg — ABNORMAL HIGH (ref 83–108)
pO2, Arterial: 89 mmHg (ref 83–108)
pO2, Arterial: 128 mmHg — ABNORMAL HIGH (ref 83–108)

## 2022-01-29 LAB — BASIC METABOLIC PANEL
Anion gap: 27 — ABNORMAL HIGH (ref 5–15)
BUN: 52 mg/dL — ABNORMAL HIGH (ref 8–23)
CO2: 12 mmol/L — ABNORMAL LOW (ref 22–32)
Calcium: 8.5 mg/dL — ABNORMAL LOW (ref 8.9–10.3)
Chloride: 99 mmol/L (ref 98–111)
Creatinine, Ser: 3.53 mg/dL — ABNORMAL HIGH (ref 0.44–1.00)
GFR, Estimated: 14 mL/min — ABNORMAL LOW (ref >60)
Glucose, Bld: 153 mg/dL — ABNORMAL HIGH (ref 70–99)
Potassium: 4.1 mmol/L (ref 3.5–5.1)
Sodium: 138 mmol/L (ref 135–145)

## 2022-01-29 LAB — PHOSPHORUS: Phosphorus: 2.6 mg/dL (ref 2.5–4.6)

## 2022-01-29 LAB — ETHANOL: Alcohol, Ethyl (B): <10 mg/dL (ref <10)

## 2022-01-29 LAB — CBC WITH DIFFERENTIAL/PLATELET
Abs Immature Granulocytes: 0.37 10*3/uL — ABNORMAL HIGH (ref 0.00–0.07)
Abs Immature Granulocytes: 0.24 10*3/uL — ABNORMAL HIGH (ref 0.00–0.07)
Band Neutrophils: 0 %
Basophils Absolute: 0 10*3/uL (ref 0.0–0.1)
Basophils Absolute: 0 10*3/uL (ref 0.0–0.1)
Basophils Relative: 0 %
Basophils Relative: 0 %
Blasts: 0 %
Eosinophils Absolute: 0.5 10*3/uL (ref 0.0–0.5)
Eosinophils Absolute: 0 10*3/uL (ref 0.0–0.5)
Eosinophils Relative: 3 %
Eosinophils Relative: 0 %
HCT: 19 % — ABNORMAL LOW (ref 36.0–46.0)
HCT: 23 % — ABNORMAL LOW (ref 36.0–46.0)
Hemoglobin: 6.5 g/dL — CL (ref 12.0–15.0)
Hemoglobin: 8.4 g/dL — ABNORMAL LOW (ref 12.0–15.0)
Immature Granulocytes: 2 %
Lymphocytes Relative: 5 %
Lymphocytes Relative: 4 %
Lymphs Abs: 0.9 10*3/uL (ref 0.7–4.0)
Lymphs Abs: 0.4 10*3/uL — ABNORMAL LOW (ref 0.7–4.0)
MCH: 21 pg — ABNORMAL LOW (ref 26.0–34.0)
MCH: 22.8 pg — ABNORMAL LOW (ref 26.0–34.0)
MCHC: 34.2 g/dL (ref 30.0–36.0)
MCHC: 36.5 g/dL — ABNORMAL HIGH (ref 30.0–36.0)
MCV: 61.5 fL — ABNORMAL LOW (ref 80.0–100.0)
MCV: 62.5 fL — ABNORMAL LOW (ref 80.0–100.0)
Metamyelocytes Relative: 1 %
Monocytes Absolute: 1.3 10*3/uL — ABNORMAL HIGH (ref 0.1–1.0)
Monocytes Absolute: 0.1 10*3/uL (ref 0.1–1.0)
Monocytes Relative: 7 %
Monocytes Relative: 1 %
Myelocytes: 1 %
Neutro Abs: 15.2 10*3/uL — ABNORMAL HIGH (ref 1.7–7.7)
Neutro Abs: 11.2 10*3/uL — ABNORMAL HIGH (ref 1.7–7.7)
Neutrophils Relative %: 83 %
Neutrophils Relative %: 93 %
Other: 0 %
Platelets: 60 10*3/uL — ABNORMAL LOW (ref 150–400)
Platelets: 27 10*3/uL — CL (ref 150–400)
Promyelocytes Relative: 0 %
RBC: 3.09 MIL/uL — ABNORMAL LOW (ref 3.87–5.11)
RBC: 3.68 MIL/uL — ABNORMAL LOW (ref 3.87–5.11)
RDW: 24.2 % — ABNORMAL HIGH (ref 11.5–15.5)
RDW: 25.6 % — ABNORMAL HIGH (ref 11.5–15.5)
Smear Review: DECREASED
Smear Review: DECREASED
WBC: 18.3 10*3/uL — ABNORMAL HIGH (ref 4.0–10.5)
WBC: 12 10*3/uL — ABNORMAL HIGH (ref 4.0–10.5)
nRBC: 0 /100 WBC
nRBC: 0.1 % (ref 0.0–0.2)
nRBC: 0.5 % — ABNORMAL HIGH (ref 0.0–0.2)

## 2022-01-29 LAB — BLOOD CULTURE ID PANEL (REFLEXED) - BCID2

## 2022-01-29 LAB — ECHOCARDIOGRAM COMPLETE
Area-P 1/2: 6.32 cm2
Calc EF: 26.1 %
Height: 64 in
S' Lateral: 2.8 cm
Single Plane A2C EF: 24 %
Single Plane A4C EF: 27.2 %
Weight: 1488.55 oz

## 2022-01-29 LAB — COMPREHENSIVE METABOLIC PANEL
ALT: 23 U/L (ref 0–44)
AST: 58 U/L — ABNORMAL HIGH (ref 15–41)
Albumin: 2.2 g/dL — ABNORMAL LOW (ref 3.5–5.0)
Alkaline Phosphatase: 65 U/L (ref 38–126)
Anion gap: 18 — ABNORMAL HIGH (ref 5–15)
BUN: 49 mg/dL — ABNORMAL HIGH (ref 8–23)
CO2: 9 mmol/L — ABNORMAL LOW (ref 22–32)
Calcium: 8.5 mg/dL — ABNORMAL LOW (ref 8.9–10.3)
Chloride: 108 mmol/L (ref 98–111)
Creatinine, Ser: 3.7 mg/dL — ABNORMAL HIGH (ref 0.44–1.00)
GFR, Estimated: 13 mL/min — ABNORMAL LOW (ref >60)
Glucose, Bld: 102 mg/dL — ABNORMAL HIGH (ref 70–99)
Potassium: 3.1 mmol/L — ABNORMAL LOW (ref 3.5–5.1)
Sodium: 135 mmol/L (ref 135–145)
Total Bilirubin: 1.5 mg/dL — ABNORMAL HIGH (ref 0.3–1.2)
Total Protein: 4.7 g/dL — ABNORMAL LOW (ref 6.5–8.1)

## 2022-01-29 LAB — COOXEMETRY PANEL
Carboxyhemoglobin: 0.5 % (ref 0.5–1.5)
Carboxyhemoglobin: 0.9 % (ref 0.5–1.5)
Carboxyhemoglobin: 1 % (ref 0.5–1.5)
Methemoglobin: <0.7 % (ref 0.0–1.5)
Methemoglobin: <0.7 % (ref 0.0–1.5)
Methemoglobin: 1 % (ref 0.0–1.5)
O2 Saturation: 41 %
O2 Saturation: 35.2 %
O2 Saturation: 45.6 %
Total hemoglobin: <6.9 g/dL — CL (ref 12.0–16.0)
Total hemoglobin: 7 g/dL — ABNORMAL LOW (ref 12.0–16.0)
Total hemoglobin: 8.9 g/dL — ABNORMAL LOW (ref 12.0–16.0)

## 2022-01-29 LAB — PROCALCITONIN: Procalcitonin: >150 ng/mL

## 2022-01-29 LAB — AMMONIA: Ammonia: 36 umol/L — ABNORMAL HIGH (ref 9–35)

## 2022-01-29 LAB — URINALYSIS, MICROSCOPIC (REFLEX)

## 2022-01-29 LAB — RAPID URINE DRUG SCREEN, HOSP PERFORMED
Amphetamines: NOT DETECTED
Barbiturates: NOT DETECTED
Benzodiazepines: NOT DETECTED
Cocaine: NOT DETECTED
Opiates: NOT DETECTED
Tetrahydrocannabinol: NOT DETECTED

## 2022-01-29 LAB — MAGNESIUM: Magnesium: 1.3 mg/dL — ABNORMAL LOW (ref 1.7–2.4)

## 2022-01-29 LAB — TECHNOLOGIST SMEAR REVIEW: Plt Morphology: DECREASED

## 2022-01-29 LAB — GLUCOSE, CAPILLARY
Glucose-Capillary: 75 mg/dL (ref 70–99)
Glucose-Capillary: 110 mg/dL — ABNORMAL HIGH (ref 70–99)
Glucose-Capillary: 136 mg/dL — ABNORMAL HIGH (ref 70–99)
Glucose-Capillary: 176 mg/dL — ABNORMAL HIGH (ref 70–99)
Glucose-Capillary: 166 mg/dL — ABNORMAL HIGH (ref 70–99)

## 2022-01-29 LAB — ACETAMINOPHEN LEVEL: Acetaminophen (Tylenol), Serum: <10 ug/mL — ABNORMAL LOW (ref 10–30)

## 2022-01-29 LAB — OSMOLALITY: Osmolality: 305 mOsm/kg — ABNORMAL HIGH (ref 275–295)

## 2022-01-29 LAB — SALICYLATE LEVEL: Salicylate Lvl: <7 mg/dL — ABNORMAL LOW (ref 7.0–30.0)

## 2022-01-29 LAB — LACTIC ACID, PLASMA
Lactic Acid, Venous: >9 mmol/L (ref 0.5–1.9)
Lactic Acid, Venous: 8.4 mmol/L (ref 0.5–1.9)
Lactic Acid, Venous: >9 mmol/L (ref 0.5–1.9)

## 2022-01-29 LAB — TROPONIN I (HIGH SENSITIVITY): Troponin I (High Sensitivity): 80 ng/L — ABNORMAL HIGH (ref <18)

## 2022-01-29 LAB — CK: Total CK: 886 U/L — ABNORMAL HIGH (ref 38–234)

## 2022-01-29 LAB — CORTISOL: Cortisol, Plasma: 44.1 ug/dL

## 2022-01-29 LAB — TSH: TSH: 0.87 u[IU]/mL (ref 0.350–4.500)

## 2022-01-29 LAB — MRSA NEXT GEN BY PCR, NASAL: MRSA by PCR Next Gen: NOT DETECTED

## 2022-01-29 MED ORDER — POTASSIUM CHLORIDE 10 MEQ/50ML IV SOLN
10.0000 meq | INTRAVENOUS | Status: AC
  Administered 2022-01-29 (×3): 10 meq via INTRAVENOUS
  Filled 2022-01-29 (×3): qty 50

## 2022-01-29 MED ORDER — SODIUM BICARBONATE 8.4 % IV SOLN
100.0000 meq | Freq: Once | INTRAVENOUS | Status: AC
  Administered 2022-01-29: 100 meq via INTRAVENOUS
  Filled 2022-01-29: qty 50

## 2022-01-29 MED ORDER — HEPARIN SODIUM (PORCINE) 1000 UNIT/ML DIALYSIS
1000.0000 [IU] | INTRAMUSCULAR | Status: AC | PRN
  Administered 2022-01-29: 2400 [IU] via INTRAVENOUS_CENTRAL
  Filled 2022-01-29: qty 6
  Filled 2022-01-29: qty 4

## 2022-01-29 MED ORDER — SODIUM BICARBONATE 8.4 % IV SOLN: 100.0000 meq | Freq: Once | INTRAVENOUS | Status: AC

## 2022-01-29 MED ORDER — DEXMEDETOMIDINE HCL IN NACL 400 MCG/100ML IV SOLN
0.4000 ug/kg/h | INTRAVENOUS | Status: DC
  Administered 2022-01-29: 0.4 ug/kg/h via INTRAVENOUS
  Filled 2022-01-29: qty 100

## 2022-01-29 MED ORDER — SODIUM CHLORIDE 0.9 % IV SOLN: 1.0000 g | INTRAVENOUS | Status: DC

## 2022-01-29 MED ORDER — CALCIUM GLUCONATE-NACL 1-0.675 GM/50ML-% IV SOLN
1.0000 g | Freq: Once | INTRAVENOUS | Status: AC
  Administered 2022-01-29: 1000 mg via INTRAVENOUS
  Filled 2022-01-29 (×2): qty 50

## 2022-01-29 MED ORDER — ORAL CARE MOUTH RINSE: 15.0000 mL | OROMUCOSAL | Status: DC | PRN

## 2022-01-29 MED ORDER — SODIUM CHLORIDE 0.9% FLUSH
3.0000 mL | Freq: Two times a day (BID) | INTRAVENOUS | Status: DC
  Administered 2022-01-29 – 2022-02-07 (×16): 3 mL via INTRAVENOUS
  Administered 2022-02-07: 10 mL via INTRAVENOUS

## 2022-01-29 MED ORDER — SODIUM CHLORIDE 0.9 % IV SOLN
250.0000 mL | INTRAVENOUS | Status: DC
  Administered 2022-01-29 – 2022-02-01 (×3): 250 mL via INTRAVENOUS
  Administered 2022-02-08: 500 mL via INTRAVENOUS
  Administered 2022-02-11: 250 mL via INTRAVENOUS

## 2022-01-29 MED ORDER — SODIUM CHLORIDE 0.9 % IV SOLN
2.0000 g | INTRAVENOUS | Status: DC
  Administered 2022-01-29 – 2022-01-31 (×3): 2 g via INTRAVENOUS
  Filled 2022-01-29 (×3): qty 20

## 2022-01-29 MED ORDER — ETOMIDATE 2 MG/ML IV SOLN: 20.0000 mg | INTRAVENOUS | Status: AC

## 2022-01-29 MED ORDER — LACTATED RINGERS IV BOLUS
500.0000 mL | Freq: Once | INTRAVENOUS | Status: AC
  Administered 2022-01-29: 500 mL via INTRAVENOUS

## 2022-01-29 MED ORDER — ARFORMOTEROL TARTRATE 15 MCG/2ML IN NEBU
15.0000 ug | INHALATION_SOLUTION | Freq: Two times a day (BID) | RESPIRATORY_TRACT | Status: DC
  Administered 2022-01-29 – 2022-02-23 (×50): 15 ug via RESPIRATORY_TRACT
  Filled 2022-01-29 (×51): qty 2

## 2022-01-29 MED ORDER — NOREPINEPHRINE 4 MG/250ML-% IV SOLN
0.0000 ug/min | INTRAVENOUS | Status: DC
  Administered 2022-01-29: 2 ug/min via INTRAVENOUS
  Filled 2022-01-29: qty 250

## 2022-01-29 MED ORDER — NOREPINEPHRINE 4 MG/250ML-% IV SOLN
2.0000 ug/min | INTRAVENOUS | Status: DC
  Administered 2022-01-29: 26 ug/min via INTRAVENOUS
  Administered 2022-01-29: 3 ug/min via INTRAVENOUS
  Filled 2022-01-29: qty 250

## 2022-01-29 MED ORDER — HEPARIN SODIUM (PORCINE) 5000 UNIT/ML IJ SOLN: 5000.0000 [IU] | Freq: Three times a day (TID) | INTRAMUSCULAR | Status: DC

## 2022-01-29 MED ORDER — VASOPRESSIN 20 UNITS/100 ML INFUSION FOR SHOCK
0.0000 [IU]/min | INTRAVENOUS | Status: DC
  Administered 2022-01-29 (×2): 0.04 [IU]/min via INTRAVENOUS
  Administered 2022-01-29: 0.03 [IU]/min via INTRAVENOUS
  Administered 2022-01-30 – 2022-01-31 (×4): 0.04 [IU]/min via INTRAVENOUS
  Filled 2022-01-29 (×7): qty 100

## 2022-01-29 MED ORDER — ORAL CARE MOUTH RINSE: 15.0000 mL | OROMUCOSAL | Status: DC

## 2022-01-29 MED ORDER — MIDAZOLAM HCL 2 MG/2ML IJ SOLN: 1.0000 mg | INTRAMUSCULAR | Status: AC

## 2022-01-29 MED ORDER — SODIUM CHLORIDE 0.9% FLUSH
10.0000 mL | Freq: Two times a day (BID) | INTRAVENOUS | Status: DC
  Administered 2022-01-29 – 2022-01-30 (×3): 10 mL
  Administered 2022-01-31: 30 mL
  Administered 2022-01-31: 40 mL
  Administered 2022-02-01 – 2022-02-06 (×9): 10 mL
  Administered 2022-02-07: 30 mL
  Administered 2022-02-07: 10 mL
  Administered 2022-02-08: 30 mL
  Administered 2022-02-08 – 2022-02-09 (×2): 10 mL
  Administered 2022-02-09: 20 mL
  Administered 2022-02-10 – 2022-02-15 (×10): 10 mL
  Administered 2022-02-15: 30 mL
  Administered 2022-02-16 – 2022-02-22 (×14): 10 mL

## 2022-01-29 MED ORDER — LACTATED RINGERS IV BOLUS
1000.0000 mL | Freq: Once | INTRAVENOUS | Status: AC
  Administered 2022-01-29: 1000 mL via INTRAVENOUS

## 2022-01-29 MED ORDER — SODIUM CHLORIDE 0.9% IV SOLUTION: Freq: Once | INTRAVENOUS | Status: AC

## 2022-01-29 MED ORDER — FENTANYL BOLUS VIA INFUSION
50.0000 ug | INTRAVENOUS | Status: DC | PRN
  Administered 2022-01-30: 50 ug via INTRAVENOUS
  Administered 2022-01-31: 100 ug via INTRAVENOUS
  Administered 2022-01-31: 50 ug via INTRAVENOUS
  Administered 2022-02-01 – 2022-02-06 (×15): 100 ug via INTRAVENOUS
  Administered 2022-02-07 (×3): 50 ug via INTRAVENOUS

## 2022-01-29 MED ORDER — BUDESONIDE 0.25 MG/2ML IN SUSP
0.2500 mg | Freq: Two times a day (BID) | RESPIRATORY_TRACT | Status: DC
  Administered 2022-01-29 – 2022-02-23 (×50): 0.25 mg via RESPIRATORY_TRACT
  Filled 2022-01-29 (×51): qty 2

## 2022-01-29 MED ORDER — SODIUM CHLORIDE 0.9 % IV BOLUS
1000.0000 mL | Freq: Once | INTRAVENOUS | Status: AC
  Administered 2022-01-29: 1000 mL via INTRAVENOUS

## 2022-01-29 MED ORDER — MIDAZOLAM HCL 2 MG/2ML IJ SOLN
1.0000 mg | INTRAMUSCULAR | Status: DC | PRN
  Administered 2022-01-31 – 2022-02-02 (×2): 2 mg via INTRAVENOUS
  Filled 2022-01-29 (×3): qty 2

## 2022-01-29 MED ORDER — SODIUM CHLORIDE 0.9 % IV SOLN
250.0000 mL | INTRAVENOUS | Status: DC | PRN
  Administered 2022-02-07 – 2022-02-08 (×3): 250 mL via INTRAVENOUS

## 2022-01-29 MED ORDER — HYDROCORTISONE SOD SUC (PF) 100 MG IJ SOLR
100.0000 mg | Freq: Three times a day (TID) | INTRAMUSCULAR | Status: DC
  Administered 2022-01-29 – 2022-02-01 (×10): 100 mg via INTRAVENOUS
  Filled 2022-01-29 (×11): qty 2

## 2022-01-29 MED ORDER — MAGNESIUM SULFATE 2 GM/50ML IV SOLN
2.0000 g | Freq: Once | INTRAVENOUS | Status: AC
  Administered 2022-01-29: 2 g via INTRAVENOUS
  Filled 2022-01-29: qty 50

## 2022-01-29 MED ORDER — IPRATROPIUM-ALBUTEROL 0.5-2.5 (3) MG/3ML IN SOLN
3.0000 mL | Freq: Four times a day (QID) | RESPIRATORY_TRACT | Status: DC | PRN
  Administered 2022-01-29: 3 mL via RESPIRATORY_TRACT
  Filled 2022-01-29: qty 3

## 2022-01-29 MED ORDER — FENTANYL CITRATE PF 50 MCG/ML IJ SOSY: 100.0000 ug | PREFILLED_SYRINGE | INTRAMUSCULAR | Status: AC

## 2022-01-29 MED ORDER — PANTOPRAZOLE SODIUM 40 MG IV SOLR
40.0000 mg | Freq: Two times a day (BID) | INTRAVENOUS | Status: DC
  Administered 2022-01-29 – 2022-02-08 (×22): 40 mg via INTRAVENOUS
  Filled 2022-01-29 (×22): qty 10

## 2022-01-29 MED ORDER — SODIUM CHLORIDE 0.9 % IV SOLN: INTRAVENOUS | Status: DC | PRN

## 2022-01-29 MED ORDER — MILRINONE LACTATE IN DEXTROSE 20-5 MG/100ML-% IV SOLN
0.1250 ug/kg/min | INTRAVENOUS | Status: DC
  Administered 2022-01-29: 0.125 ug/kg/min via INTRAVENOUS
  Filled 2022-01-29 (×2): qty 100

## 2022-01-29 MED ORDER — POLYETHYLENE GLYCOL 3350 17 G PO PACK
17.0000 g | PACK | Freq: Every day | ORAL | Status: DC
  Filled 2022-01-29: qty 1

## 2022-01-29 MED ORDER — FENTANYL CITRATE PF 50 MCG/ML IJ SOSY: 50.0000 ug | PREFILLED_SYRINGE | Freq: Once | INTRAMUSCULAR | Status: DC

## 2022-01-29 MED ORDER — ETOMIDATE 2 MG/ML IV SOLN
INTRAVENOUS | Status: AC
  Administered 2022-01-29: 20 mg via INTRAVENOUS
  Filled 2022-01-29: qty 20

## 2022-01-29 MED ORDER — FAMOTIDINE 20 MG PO TABS
20.0000 mg | ORAL_TABLET | Freq: Every day | ORAL | Status: DC
  Filled 2022-01-29: qty 1

## 2022-01-29 MED ORDER — FENTANYL 2500MCG IN NS 250ML (10MCG/ML) PREMIX INFUSION
50.0000 ug/h | INTRAVENOUS | Status: DC
  Administered 2022-01-29: 75 ug/h via INTRAVENOUS
  Administered 2022-01-30: 50 ug/h via INTRAVENOUS
  Administered 2022-02-01: 100 ug/h via INTRAVENOUS
  Administered 2022-02-02: 120 ug/h via INTRAVENOUS
  Administered 2022-02-02 – 2022-02-03 (×2): 300 ug/h via INTRAVENOUS
  Administered 2022-02-03 – 2022-02-04 (×2): 400 ug/h via INTRAVENOUS
  Administered 2022-02-04: 350 ug/h via INTRAVENOUS
  Administered 2022-02-04 – 2022-02-05 (×2): 400 ug/h via INTRAVENOUS
  Administered 2022-02-06: 100 ug/h via INTRAVENOUS
  Administered 2022-02-07: 250 ug/h via INTRAVENOUS
  Administered 2022-02-08: 100 ug/h via INTRAVENOUS
  Filled 2022-01-29 (×14): qty 250

## 2022-01-29 MED ORDER — DOBUTAMINE IN D5W 4-5 MG/ML-% IV SOLN
7.5000 ug/kg/min | INTRAVENOUS | Status: DC
  Administered 2022-01-29: 5 ug/kg/min via INTRAVENOUS
  Filled 2022-01-29 (×2): qty 250

## 2022-01-29 MED ORDER — SODIUM BICARBONATE 8.4 % IV SOLN
INTRAVENOUS | Status: DC
  Filled 2022-01-29 (×3): qty 1000

## 2022-01-29 MED ORDER — ORAL CARE MOUTH RINSE
15.0000 mL | OROMUCOSAL | Status: DC
  Administered 2022-01-29 – 2022-02-08 (×117): 15 mL via OROMUCOSAL

## 2022-01-29 MED ORDER — ACETAMINOPHEN 650 MG RE SUPP
650.0000 mg | Freq: Once | RECTAL | Status: AC
  Administered 2022-01-29: 650 mg via RECTAL
  Filled 2022-01-29: qty 1

## 2022-01-29 MED ORDER — SODIUM CHLORIDE 0.9% FLUSH: 3.0000 mL | INTRAVENOUS | Status: DC | PRN

## 2022-01-29 MED ORDER — MIDAZOLAM HCL 2 MG/2ML IJ SOLN
INTRAMUSCULAR | Status: AC
  Administered 2022-01-29: 1 mg via INTRAVENOUS
  Filled 2022-01-29: qty 2

## 2022-01-29 MED ORDER — NOREPINEPHRINE 16 MG/250ML-% IV SOLN
2.0000 ug/min | INTRAVENOUS | Status: DC
  Administered 2022-01-29: 22 ug/min via INTRAVENOUS
  Administered 2022-01-29: 6 ug/min via INTRAVENOUS
  Administered 2022-01-30: 24 ug/min via INTRAVENOUS
  Administered 2022-01-30: 26 ug/min via INTRAVENOUS
  Administered 2022-01-31: 18 ug/min via INTRAVENOUS
  Administered 2022-01-31: 24 ug/min via INTRAVENOUS
  Administered 2022-02-01: 10 ug/min via INTRAVENOUS
  Administered 2022-02-02 – 2022-02-07 (×2): 2 ug/min via INTRAVENOUS
  Administered 2022-02-07: 4 ug/min via INTRAVENOUS
  Filled 2022-01-29 (×9): qty 250

## 2022-01-29 MED ORDER — SODIUM CHLORIDE 0.9% FLUSH: 10.0000 mL | INTRAVENOUS | Status: DC | PRN

## 2022-01-29 MED ORDER — VANCOMYCIN VARIABLE DOSE PER UNSTABLE RENAL FUNCTION (PHARMACIST DOSING): Status: DC

## 2022-01-29 MED ORDER — SODIUM BICARBONATE 8.4 % IV SOLN
INTRAVENOUS | Status: AC
  Administered 2022-01-29: 100 meq via INTRAVENOUS
  Filled 2022-01-29: qty 100

## 2022-01-29 MED ORDER — FENTANYL CITRATE PF 50 MCG/ML IJ SOSY
PREFILLED_SYRINGE | INTRAMUSCULAR | Status: AC
  Administered 2022-01-29: 100 ug via INTRAVENOUS
  Filled 2022-01-29: qty 2

## 2022-01-29 MED ORDER — CHLORHEXIDINE GLUCONATE CLOTH 2 % EX PADS
6.0000 | MEDICATED_PAD | Freq: Every day | CUTANEOUS | Status: DC
  Administered 2022-01-29 – 2022-02-23 (×27): 6 via TOPICAL

## 2022-01-29 NOTE — Progress Notes (Addendum)
Called to place central line.   Levophed being titrated down.   Patient awake and calm at the moment but disoriented and intermittently tries to climb out of bed, pull lines etc.    Lactate increasing for unclear causes.  No abdominal pain.  Nurse to place foley, though no evidence of obstruction on her CT abdomen.    Trial of precedex to aid in cooperation for central line placement.  Bicarb infusion.  Attempted to call family listed as contacts (son and spouse), no answer.

## 2022-01-29 NOTE — H&P (Addendum)
NAME:  Nicole Cunningham, MRN:  [id number], DOB:  10-12-1960, LOS: 0 ADMISSION DATE:  01/28/2022, CONSULTATION DATE:  11/6 REFERRING MD:  Dr. Sedonia Small, CHIEF COMPLAINT:  septic shock   History of Present Illness:  Patient is a 61 year old female with pertinent PMH of COPD, anemia, HTN presents to [facility] ED on 11/6 with AMS.  Patient recently admitted to [facility] with AMS on 11/2021.  Found to have UTI and pyelonephritis.  On 11/5, patient having increased confusion.  Having some SOB over the past couple of days.  Brought to [facility] ED for further eval.  Upon arrival to [facility] ED, patient confused but able to state name. Tachycardic.  Mucous membranes appear dry.  Tmax 102 F.  Initial BP 93/62.  Respirations in 20s to 30s with sats 100% on room air.  CBG 90.  Patient started on IV fluids and cultures obtained.  Started on broad-spectrum antibiotics.  Initial LA 8.5 then 9.  More IV fluids given.  Patient became more hypotensive despite IV fluids.  Starting on low-dose levo.  PCCM consulted.  Pertinent ED labs: CO2 14, BUN 51, creat 4.13, AST 52, alk phos 159, AG 23, WBC 1.3, Hgb 9.3, platelets 70, troponin 64 then 80.  CT head without acute abnormality.  CXR no significant findings.  CT abdomen pelvis 17 mm nonobstructive renal calculus without hydronephrosis.  Pertinent  Medical History   Past Medical History:  Diagnosis Date   Bronchitis    COPD (chronic obstructive pulmonary disease) ([place])    Hypertension      Significant Hospital Events: Including procedures, antibiotic start and stop dates in addition to other pertinent events   11/6 septic shock on levo  Interim History / Subjective:  See above  Objective   Blood pressure 98/66, pulse (!) 143, temperature (!) 102 F (38.9 C), temperature source Oral, resp. rate 20, height _0  (1.626 m), weight 40.8 kg, SpO2 100 %.        Intake/Output Summary (Last 24 hours) at 01/29/2022 0148 Last data filed at 01/28/2022 2356 Gross per 24 hour  Intake --   Output 0 ml  Net 0 ml   Filed Weights   01/28/22 2220  Weight: 40.8 kg    Examination: General: Thin cachectic ill appearing female HEENT: MM pink/dry Neuro: Able to state but confused on place; unable to answer other questions; follows some commands; PERRL CV: s1s2, tachycardia 130s, no m/r/g PULM:  dim clear BS bilaterally; RA GI: soft, bsx4 active  Extremities: warm/dry, thin cachectic Skin: no rashes or lesions appreciated    Resolved Hospital Problem list     Assessment & Plan:  Septic shock: Source unknown currently P: -Admit to ICU with continuous telemetry -Wean levo for MAP goal greater than 65 -IV fluids -given cefepime, vanc, flagyl; will continue rocephin and vanc -Check PCT -Follow cultures: BC x2, UA, UC -Check cortisol -Trend troponin and lactate  Acute encephalopathy: Likely sepsis related -CT head without acute abnormality P: -continue abx above; hold cefepime given encephalopathy -follow cultures and UA; check pct -limit sedating meds -Check UDS, ethanol, salicylate/acetaminophen level -Check ammonia and TSH -check vbg  COPD: On Breo Ellipta P: -Currently on room air -Pulmicort and Brovana -As needed DuoNeb for wheezing  AKI AGMA LA P: -IV fluids -Consider bicarb -Trend BMP / urinary output -Replace electrolytes as indicated -Avoid nephrotoxic agents, ensure adequate renal perfusion  Pancytopenia: 9/15 wbc 9.6 now 1.3 Hx of iron def anemia: Requiring frequent transfusions P: -Treat for sepsis as  above -trend cbc -Consider heme/onc consult  Mildly elevated LFTs -CT abdomen pelvis negative for acute findings P: -Trend CMP  QTc prolongation P: -Avoid QTc prolonging agents -Check mag -Telemetry monitoring  Renal calculus: 17 mm nonobstructing renal calculus without hydronephrosis P: -Will need urology consult in am  Hx of anxiety/depression P: -Hold home anxiety meds given QTc 520  Best Practice (right click and  "Reselect all SmartList Selections" daily)   Diet/type: NPO DVT prophylaxis: prophylactic heparin  GI prophylaxis: N/A Lines: N/A Foley:  N/A Code Status:  full code Last date of multidisciplinary goals of care discussion [11/6 attempted to reach son Fritz Pickerel and spouse Fritz Pickerel over phone but no answer over phone.]  Labs   CBC: Recent Labs  Lab 01/28/22 2234  WBC 1.3*  NEUTROABS 0.8*  HGB 9.3*  HCT 28.6*  MCV 63.7*  PLT 70*    Basic Metabolic Panel: Recent Labs  Lab 01/28/22 2234  NA 139  K 3.6  CL 102  CO2 14*  GLUCOSE 90  BUN 51*  CREATININE 4.13*  CALCIUM 10.2   GFR: Estimated Creatinine Clearance: 9 mL/min (A) (by C-G formula based on SCr of 4.13 mg/dL (H)). Recent Labs  Lab 01/28/22 2234 01/29/22 0028  WBC 1.3*  --   LATICACIDVEN 8.5* >9.0*    Liver Function Tests: Recent Labs  Lab 01/28/22 2234  AST 52*  ALT 27  ALKPHOS 159*  BILITOT 1.5*  PROT 6.9  ALBUMIN 3.4*   No results for input(s): "LIPASE", "AMYLASE" in the last 168 hours. No results for input(s): "AMMONIA" in the last 168 hours.  ABG No results found for: "PHART", "PCO2ART", "PO2ART", "HCO3", "TCO2", "ACIDBASEDEF", "O2SAT"   Coagulation Profile: Recent Labs  Lab 01/28/22 2234  INR 1.8*    Cardiac Enzymes: No results for input(s): "CKTOTAL", "CKMB", "CKMBINDEX", "TROPONINI" in the last 168 hours.  HbA1C: Hgb A1c MFr Bld  Date/Time Value Ref Range Status  04/18/2020 05:13 PM 5.3 4.8 - 5.6 % Final    Comment:    (NOTE) Pre diabetes:          5.7%-6.4%  Diabetes:              >6.4%  Glycemic control for   <7.0% adults with diabetes     CBG: No results for input(s): "GLUCAP" in the last 168 hours.  Review of Systems:   Patient is encephalopathic and/or intubated. Therefore history has been obtained from chart review.    Past Medical History:  She,  has a past medical history of Bronchitis, COPD (chronic obstructive pulmonary disease) ([place]), and Hypertension.    Surgical History:  History reviewed. No pertinent surgical history.   Social History:   reports that she has never smoked. She has never used smokeless tobacco. She reports that she does not currently use alcohol. She reports that she does not use drugs.   Family History:  Her family history includes Diabetes in her father and mother.   Allergies No Known Allergies   Home Medications  Prior to Admission medications   Medication Sig Start Date End Date Taking? Authorizing Provider  albuterol (VENTOLIN HFA) 108 (90 Base) MCG/ACT inhaler Inhale 1-2 puffs into the lungs every 6 (six) hours as needed for wheezing or shortness of breath. 10/12/20   Camillia Herter, NP  ferrous sulfate 325 (65 FE) MG tablet Take 1 tablet (325 mg total) by mouth 2 (two) times daily with a meal. 12/09/21   Barb Merino, MD  FLUoxetine (PROZAC) 40 MG  capsule Take 2 capsules (80 mg total) by mouth daily. 05/18/21   Nwoko, Terese Door, PA  fluticasone furoate-vilanterol (BREO ELLIPTA) 200-25 MCG/ACT AEPB inhale 1 puff into the lungs daily 10/12/20   Camillia Herter, NP  gabapentin (NEURONTIN) 400 MG capsule Take 1 capsule (400 mg total) by mouth at bedtime. 05/18/21   Nwoko, Terese Door, PA  mirtazapine (REMERON) 30 MG tablet Take 1 tablet (30 mg total) by mouth at bedtime. 05/18/21   Nwoko, Terese Door, PA  potassium chloride SA (KLOR-CON M) 20 MEQ tablet Take 2 tablets (40 mEq total) by mouth daily for 3 days. 12/09/21 12/14/21  Barb Merino, MD  QUEtiapine (SEROQUEL) 300 MG tablet Take 1 tablet (300 mg total) by mouth at bedtime. 12/09/21   Barb Merino, MD  Ipratropium-Albuterol (COMBIVENT RESPIMAT) 20-100 MCG/ACT AERS respimat Inhale 1 puff into the lungs every 6 (six) hours. Patient not taking: No sig reported 12/15/19 04/22/20  Elsie Stain, MD     Critical care time: 45 minutes    JD Rexene Agent [facility] 01/29/2022, 1:49 AM  Please see [email or web address] for pager details.  From 7A-7P if  no response, please call [phone]. After hours, please call ELink [phone].

## 2022-01-29 NOTE — Progress Notes (Addendum)
Attempted to call patient's spouse, Fritz Pickerel x 2 with the only number listed in the chart [phone] regarding emergent intubation and need for central line placement.  Message "The subscriber you have dialed is not in service" heard twice.  No other contact numbers listed in chart; additionally same number listed in note dated 02/2021.  Patient's husband reportedly called from another number [phone] and this was called x 3 with a message stating, "the person at this extension is not available".  Will proceed with intubation and line placement as patient is critically ill altered and unable to consent for herself and no family could be contacted.  Lestine Mount, PA-C [facility] 01/29/22 7:44 AM  Please see [email or web address] for pager details.  From 7A-7P if no response, please call [phone] After hours, please call ELink [phone]

## 2022-01-29 NOTE — ED Notes (Signed)
Mutiple new attempts for IV access attempted with no success.

## 2022-01-29 NOTE — ED Provider Notes (Signed)
  Provider Note MRN:  [id number]  Landa date & time: 01/29/22    ED Course and Medical Decision Making    Patient not responding very well to fluids, remains hypotensive, tachycardic in the 130s, febrile.  Concern for septic shock.  Lactate significantly elevated and rising on repeat.  Possibly under resuscitated, providing more fluids, providing low-dose norepinephrine, will admit to ICU.  Marland KitchenCritical Care  Performed by: Maudie Flakes, MD Authorized by: Maudie Flakes, MD   Critical care provider statement:    Critical care time (minutes):  45   Critical care was necessary to treat or prevent imminent or life-threatening deterioration of the following conditions:  Sepsis and shock   Critical care was time spent personally by me on the following activities:  Development of treatment plan with patient or surrogate, discussions with consultants, evaluation of patient's response to treatment, examination of patient, ordering and review of laboratory studies, ordering and review of radiographic studies, ordering and performing treatments and interventions, pulse oximetry, re-evaluation of patient's condition and review of old charts   Final Clinical Impressions(s) / ED Diagnoses     ICD-10-CM   1. Sepsis, due to unspecified organism, unspecified whether acute organ dysfunction present [facility])  A41.9       ED Discharge Orders     None       Discharge Instructions   None     Barth Kirks. Sedonia Small, [place] [email or web address]    Maudie Flakes, MD 01/29/22 [phone]

## 2022-01-29 NOTE — Progress Notes (Signed)
eLink Physician-Brief Progress Note Patient Name: CELITA ARON DOB: Oct 07, 1960 MRN: [id number]   Date of Service  01/29/2022  HPI/Events of Note  61/F with history of COPD, hypertension, who presents with AMS. On assessment, pt confused but able to state name. She was febrile, with borderline low BP.  Initial workup significant for lactic acid of 8.5. She was started on IVF and empiric antibiotics. During her stay in the ED, BP downtrended, and she was ultimately started on vasopressor support.    eICU Interventions  Septic shock - Pt with recent UTI, UA with moderate leukocytes. CT showed non obstructive renal calculus. No hydronephrosis.  - Started on empiric antibioics. Will follow cultures and deescalate as appropriate - Trend WBC, lactate, procalcitonin - On levophed. Titrate to maintain MAP >65 - IF with high levophed requirement, would start vasopressin, stress dose steroids.   2. Acute encephalopathy - likely due to above.  - Will continue to monitor mental status closely.   3. History of COPD  - Continue bronchodilator therapy - Presently maintaining adequate saturations on room air.   4. DVT prophylaxis - Learned heparin        Caitriona Sundquist M DELA CRUZ 01/29/2022, 4:43 AM

## 2022-01-29 NOTE — Progress Notes (Signed)
[facility] Progress Note Patient Name: Nicole Cunningham DOB: Oct 06, 1960 MRN: [id number]   Date of Service  01/29/2022  HPI/Events of Note  Platelet count down to 27 from 60 K, no overt bleeding. Patient is on Heparin, but is also septic.  eICU Interventions  HIT panel order set instituted,  DIC panel ordered.  Trend platelets. Monitor closely for bleeding.        [name] [name] 01/29/2022, 10:50 PM

## 2022-01-29 NOTE — Progress Notes (Signed)
[facility] Progress Note Patient Name: Nicole Cunningham DOB: 1960-04-15 MRN: [id number]   Date of Service  01/29/2022  HPI/Events of Note  Reviewed ABG, pt with combined metabolic acidosis and respiratory alkalosis.   eICU Interventions  Pt undergoing treatment for underlying septic shock.  Placed order to give 2amps of sodium bicarbonate at this time.  Will continue to monitor serial BMP, ABG.         [place] 01/29/2022, 6:33 AM

## 2022-01-29 NOTE — ED Notes (Signed)
Patient transported to CT 

## 2022-01-29 NOTE — Progress Notes (Signed)
PHARMACY - PHYSICIAN COMMUNICATION CRITICAL VALUE ALERT - BLOOD CULTURE IDENTIFICATION (BCID)  TURQUOISE ESCH is an 61 y.o. female who presented to [facility] on 01/28/2022 with a chief complaint of AMS.  Assessment:  61 yo female with e.coli bacteremia and is currently on ceftriaxone and vancomycin.   Name of physician (or Provider) Contacted: Johnnette Gourd, PharmD to notify critical care team  Current antibiotics: ceftriaxone and vancomycin  Changes to prescribed antibiotics recommended:   Continue ceftriaxone 2 grams daily. Consider stopping vancomycin.   Results for orders placed or performed during the hospital encounter of 01/28/22  Blood Culture ID Panel (Reflexed) (Collected: 01/28/2022 10:34 PM)  Result Value Ref Range   Enterococcus faecalis NOT DETECTED NOT DETECTED   Enterococcus Faecium NOT DETECTED NOT DETECTED   Listeria monocytogenes NOT DETECTED NOT DETECTED   Staphylococcus species NOT DETECTED NOT DETECTED   Staphylococcus aureus (BCID) NOT DETECTED NOT DETECTED   Staphylococcus epidermidis NOT DETECTED NOT DETECTED   Staphylococcus lugdunensis NOT DETECTED NOT DETECTED   Streptococcus species NOT DETECTED NOT DETECTED   Streptococcus agalactiae NOT DETECTED NOT DETECTED   Streptococcus pneumoniae NOT DETECTED NOT DETECTED   Streptococcus pyogenes NOT DETECTED NOT DETECTED   A.calcoaceticus-baumannii NOT DETECTED NOT DETECTED   Bacteroides fragilis NOT DETECTED NOT DETECTED   Enterobacterales DETECTED (A) NOT DETECTED   Enterobacter cloacae complex NOT DETECTED NOT DETECTED   Escherichia coli DETECTED (A) NOT DETECTED   Klebsiella aerogenes NOT DETECTED NOT DETECTED   Klebsiella oxytoca NOT DETECTED NOT DETECTED   Klebsiella pneumoniae NOT DETECTED NOT DETECTED   Proteus species NOT DETECTED NOT DETECTED   Salmonella species NOT DETECTED NOT DETECTED   Serratia marcescens NOT DETECTED NOT DETECTED   Haemophilus influenzae NOT DETECTED NOT DETECTED   Neisseria  meningitidis NOT DETECTED NOT DETECTED   Pseudomonas aeruginosa NOT DETECTED NOT DETECTED   Stenotrophomonas maltophilia NOT DETECTED NOT DETECTED   Candida albicans NOT DETECTED NOT DETECTED   Candida auris NOT DETECTED NOT DETECTED   Candida glabrata NOT DETECTED NOT DETECTED   Candida krusei NOT DETECTED NOT DETECTED   Candida parapsilosis NOT DETECTED NOT DETECTED   Candida tropicalis NOT DETECTED NOT DETECTED   Cryptococcus neoformans/gattii NOT DETECTED NOT DETECTED   CTX-M ESBL NOT DETECTED NOT DETECTED   Carbapenem resistance IMP NOT DETECTED NOT DETECTED   Carbapenem resistance KPC NOT DETECTED NOT DETECTED   Carbapenem resistance NDM NOT DETECTED NOT DETECTED   Carbapenem resist OXA 48 LIKE NOT DETECTED NOT DETECTED   Carbapenem resistance VIM NOT DETECTED NOT DETECTED    Jeneen Rinks 83/04/9189  6:60 PM

## 2022-01-29 NOTE — Progress Notes (Signed)
Peripherally Inserted Central Catheter Placement  The IV Nurse has discussed with the patient and/or persons authorized to consent for the patient, the purpose of this procedure and the potential benefits and risks involved with this procedure.  The benefits include less needle sticks, lab draws from the catheter, and the patient may be discharged home with the catheter. Risks include, but not limited to, infection, bleeding, blood clot (thrombus formation), and puncture of an artery; nerve damage and irregular heartbeat and possibility to perform a PICC exchange if needed/ordered by physician.  Alternatives to this procedure were also discussed.  Bard Power PICC patient education guide, fact sheet on infection prevention and patient information card has been provided to patient /or left at bedside.  Telephone consent obtained from husband.  PICC inserted by Aldona Lento, RN   PICC Placement Documentation  PICC Triple Lumen 28/41/32 Right Basilic 42 cm 2 cm (Active)  Indication for Insertion or Continuance of Line Poor Vasculature-patient has had multiple peripheral attempts or PIVs lasting less than 24 hours;Limited venous access - need for IV therapy >5 days (PICC only) 01/29/22 2015  Exposed Catheter (cm) 2 cm 01/29/22 2015  Site Assessment Clean, Dry, Intact 01/29/22 2015  Lumen #1 Status Flushed;Saline locked;Blood return noted 01/29/22 2015  Lumen #2 Status Flushed;Saline locked;No blood return 01/29/22 2015  Lumen #3 Status Flushed;Saline locked;Blood return noted 01/29/22 2015  Dressing Type Transparent;Securing device 01/29/22 2015  Dressing Status Antimicrobial disc in place;Clean, Dry, Intact 01/29/22 2015  Safety Lock Not Applicable 44/01/02 7253  Line Care Connections checked and tightened 01/29/22 2015  Line Adjustment (NICU/IV Team Only) No 01/29/22 2015  Dressing Intervention New dressing 01/29/22 2015  Dressing Change Due 02/05/22 01/29/22 2015       Elisia Stepp, Nicolette Bang 01/29/2022, 8:17 PM

## 2022-01-29 NOTE — Progress Notes (Addendum)
61 year old woman admitted with presumptive diagnosis of septic shock. On my arrival Levophed titrated to 20 mics peripherally. She was confused, more hypoxic on 15 L high flow nasal cannula breathing at 30 times a minute. She was oriented to name not to time, had mittens on and was pulling at her lines.  Central line could not be placed for this reason. Lactate ranging from 8-9 Benign abdomen, CT abdomen negative. No evidence of limb ischemia, no seizures since admit. Decision made to emergently intubate, bicarb 2 A given prior Chest x-ray postintubation shows bilateral upper lobe infiltrates ET tube at carina Vent and sedation orders given We will place central line/HD catheter Obtain serum osmolality investigate for toxic alcohols. More history obtained from husband and son, she is not compliant with medications including Seroquel.  CK slight high but no reason to suspect neuroleptic malignant syndrome We will continue broad-spectrum antibiotics, obtain respiratory culture and await blood cultures Follow serial lactate  My independent critical care time was 60 minutes  Tatia Petrucci V. Lahoma Constantin MD   Bedside echo shows depressed EF. Co. ox 41%. Obtain stat echo and start IV milrinone, we will involve cardiology Hemoglobin noted to be 6.9 on i-STAT repeat CBC sent, 1 unit PRBC transfusion, start IV Protonix and hold off on tube feeds  Additional critical care time was 15 minutes  Roselee Tayloe V. Elsworth Soho MD

## 2022-01-29 NOTE — TOC Progression Note (Signed)
Transition of Care [facility]) - Initial/Assessment Note    Patient Details  Name: Nicole Cunningham MRN: [id number] Date of Birth: June 18, 1960  [facility]) CM/SW Contact:    Milinda Antis, LCSWA Phone Number: 01/29/2022, 12:17 PM  Clinical Narrative:                 Transition of Care Department [facility]) has reviewed patient.  Patient admitted for septic shock and from a hotel with family.  Patient is currently intubated.  We will continue to monitor patient advancement through interdisciplinary progression rounds.   If new patient transition needs arise, please place a TOC consult.    Patient Goals and CMS Choice        Expected Discharge Plan and Services                                                Prior Living Arrangements/Services                       Activities of Daily Living      Permission Sought/Granted                  Emotional Assessment              Admission diagnosis:  Septic shock ([place]) [A41.9, R65.21] Sepsis, due to unspecified organism, unspecified whether acute organ dysfunction present [facility]) [A41.9] Patient Active Problem List   Diagnosis Date Noted   Septic shock ([place]) 01/29/2022   Acute encephalopathy 01/29/2022   Protein-calorie malnutrition, severe 12/09/2021   Anxiety and depression 12/07/2021   Thrombocytopenia ([place]) 12/07/2021   Unintentional weight loss 12/06/2021   Delirium 12/06/2021   Symptomatic anemia 04/08/2021   Syncope 04/08/2021   MDD (major depressive disorder), recurrent, in partial remission ([place]) 10/14/2020   Essential hypertension 04/25/2020   History of COVID-19 04/25/2020   History of sepsis 04/25/2020   Anemia 04/25/2020   Hypomagnesemia 04/25/2020   COVID-19 virus infection 04/20/2020   AKI (acute kidney injury) ([place]) 04/18/2020   Severe sepsis ([place]) 04/18/2020   Hypertensive urgency 12/15/2019   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   MDD (major depressive disorder),  recurrent, in full remission ([place]) 12/14/2019   Anxiety disorder 12/14/2019   PCP:  [name], MD Pharmacy:   [facility] ([facility]), [place] [address] [place] [address] Phone: [phone] Fax: [place] [place] [address] [place] [address] Phone: [phone] Fax: [place] [address], [place] [address] Phone: [phone] Fax: [place] [address] Phone: [phone] Fax: [phone]  [place] [address] Phone: [phone] Fax: [phone]     Social Determinants of Health (SDOH) Interventions    Readmission Risk Interventions     No data to display

## 2022-01-29 NOTE — Procedures (Signed)
Intubation Procedure Note  TAHRA HITZEMAN  [id number]  1960-09-26  Date:01/29/22  Time:8:10 AM   Provider Performing:Deairra Halleck V. Ralonda Tartt    Procedure: Intubation (41962)  Indication(s) Respiratory Failure  Consent Unable to obtain consent due to emergent nature of procedure.   Anesthesia Etomidate, Versed, and Fentanyl Bicarb x 2 amps On levo gtt   Time Out Verified patient identification, verified procedure, site/side was marked, verified correct patient position, special equipment/implants available, medications/allergies/relevant history reviewed, required imaging and test results available.   Sterile Technique Usual hand hygeine, masks, and gloves were used   Procedure Description Patient positioned in bed supine.  Sedation given as noted above.  Patient was intubated with endotracheal tube using Glidescope.  View was Grade 2 only posterior commissure .  Number of attempts was 1.  Colorimetric CO2 detector was consistent with tracheal placement.   Complications/Tolerance None; patient tolerated the procedure well. Chest X-ray is ordered to verify placement.   EBL Minimal   Specimen(s) None  Zaquan Duffner V. Elsworth Soho MD

## 2022-01-29 NOTE — ED Notes (Signed)
IV team present  

## 2022-01-29 NOTE — Progress Notes (Signed)
An USGPIV (ultrasound guided PIV) has been placed for short-term vasopressor infusion. A correctly placed ivWatch must be used when administering Vasopressors. Should this treatment be needed beyond 72 hours, central line access should be obtained.  It will be the responsibility of the bedside nurse to follow best practice to prevent extravasations.   ?

## 2022-01-29 NOTE — Progress Notes (Signed)
Pharmacy Antibiotic Note  Nicole Cunningham is a 61 y.o. female admitted on 01/28/2022 with sepsis.  Pharmacy has been consulted for Vancomycin/Cefepime dosing. Leukopenic. Acute renal failure.   Plan: Vancomycin 1000 mg IV x 1, further dosing per renal function trend Cefepime 1g IV q24h Trend WBC, temp, renal function  F/U infectious work-up Drug levels as indicated   Height: 5\' 4"  (162.6 cm) Weight: 40.8 kg (90 lb) IBW/kg (Calculated) : 54.7  Temp (24hrs), Avg:99.9 F (37.7 C), Min:97.8 F (36.6 C), Max:102 F (38.9 C)  Recent Labs  Lab 01/28/22 2234  WBC 1.3*  CREATININE 4.13*  LATICACIDVEN 8.5*    Estimated Creatinine Clearance: 9 mL/min (A) (by C-G formula based on SCr of 4.13 mg/dL (H)).    No Known Allergies  Narda Bonds, PharmD, BCPS Clinical Pharmacist Phone: [phone]

## 2022-01-29 NOTE — Progress Notes (Signed)
Initial Nutrition Assessment  DOCUMENTATION CODES:   Underweight, Severe malnutrition in context of chronic illness  INTERVENTION:   - Recommend adjustment of OG tube per most recent x-ray; RN aware  Recommend initiation of trickle tube feeds: - Peptamen 1.5 @ 20 ml/hr (480 ml/day)   Goal tube feeding regimen: - Peptamen 1.5 @ 50 ml/hr (1200 ml/day)  Recommended tube feeding regimen at goal rate would provide 1800 kcal, 82 grams of protein, and 922 ml of H2O.  NUTRITION DIAGNOSIS:   Severe Malnutrition related to chronic illness (COPD) as evidenced by severe muscle depletion, severe fat depletion.  GOAL:   Patient will meet greater than or equal to 90% of their needs  MONITOR:   Vent status, Labs, Weight trends, I & O's  REASON FOR ASSESSMENT:   Ventilator    ASSESSMENT:   61 year old female who presented to the ED on 11/05 with AMS. PMH of COPD, anemia, HTN. Pt admitted with septic shock, AKI.  11/06 - intubation  Discussed pt with RN. Discussed pt with CCM. Initially plan was to start trickle tube feeds but pt found to have mixed septic and cardiogenic shock and acute systolic heart failure so RD was asked to hold off on starting tube feeds at this time. Pt also with AKI and bacteremia. Will leave tube feeding recommendations.  Consult received for initiation of trickle tube feeds. Pt with OG tube entering the stomach and doubling back with the tip re-entering the distal esophagus per chest x-ray this morning. Spoke with RN via phone call to make aware that OG tube needs adjustment.  Pt meets criteria for severe malnutrition based on NFPE.  Admit weight: 40.8 kg Current weight: 42.2 kg  Patient is currently intubated on ventilator support MV: 12.8 L/min Temp (24hrs), Avg:99 F (37.2 C), Min:97.7 F (36.5 C), Max:102 F (38.9 C) BP (a-line 2): 131/82 MAP (a-line 2): 100  Drips: Fentanyl Milrinone Levophed Vasopressin LR: 150 ml/hr Sodium bicarb in  D5: 75 ml/hr  Medications reviewed and include: IV solu-cortef, IV protonix, IV abx, IV magnesium sulfate 2 grams once  Labs reviewed: BUN 52, creatinine 3.53, ionized calcium 1.10, magnesium 1.3, lactic acid >9.0, hemoglobin 6.5, WBC 18.3, platelets 60 CBG's: 75-136 x 12 hours  I/O's: +5.2 L since admit  NUTRITION - FOCUSED PHYSICAL EXAM:  Flowsheet Row Most Recent Value  Orbital Region Severe depletion  Upper Arm Region Severe depletion  Thoracic and Lumbar Region Severe depletion  Buccal Region Unable to assess  Temple Region Severe depletion  Clavicle Bone Region Severe depletion  Clavicle and Acromion Bone Region Severe depletion  Scapular Bone Region Severe depletion  Dorsal Hand Severe depletion  Patellar Region Severe depletion  Anterior Thigh Region Severe depletion  Posterior Calf Region Severe depletion  Edema (RD Assessment) None  Hair Reviewed  Eyes Reviewed  Mouth Reviewed  Skin Reviewed  Nails Reviewed       Diet Order:   Diet Order             Diet NPO time specified  Diet effective now                   EDUCATION NEEDS:   Not appropriate for education at this time  Skin:  Skin Assessment: Reviewed RN Assessment  Last BM:  01/29/22 small type 7  Height:   Ht Readings from Last 1 Encounters:  01/28/22 5\' 4"  (1.626 m)    Weight:   Wt Readings from Last 1 Encounters:  01/29/22 42.2  kg    Ideal Body Weight:  54.5 kg  BMI:  Body mass index is 15.97 kg/m.  Estimated Nutritional Needs:   Kcal:  1650-1850  Protein:  70-85 grams  Fluid:  1.6-1.8 L    Mertie Clause, MS, RD, LDN Inpatient Clinical Dietitian Please see [facility] for contact information.

## 2022-01-29 NOTE — Progress Notes (Signed)
Echocardiogram 2D Echocardiogram has been performed.  Oneal Deputy Leidy Massar RDCS 01/29/2022, 1:26 PM

## 2022-01-29 NOTE — Progress Notes (Signed)
Critical ABG (Hb 6.5) results given to MD.

## 2022-01-29 NOTE — Consult Note (Addendum)
Advanced Heart Failure Team Consult Note   Primary Physician: [name], MD PCP-Cardiologist:  None  Reason for Consultation: Cardiogenic Shock  HPI:    Nicole Cunningham is seen today for evaluation of cardiogenic shock at the request of Dr. Vassie Loll, PCCM.   61 y/o AAF w/ h/o HTN, COPD, anemia and prior admission to [facility] 9/23 for UTI and pyelonephritis.   Prior echo 1/23 showed normal LVEF, 55-60%, RV normal.   Presented to ED on 11/5 w/ AMS, fever temp 102, hypotension and lactic acidosis, LA > 9.0. Admitted for septic shock. Required intubation. Resuscitated w/ IVFs and placed on NE + VP and bicarb gtt. Pan cultures obtained. Started on broad spectrum abx w/ Vanc + cefepime.   BCx + for Enterobacterials and E. Coli. UA + mod leukocytes. Nitrate negative. UCx pending. PCT >150. CT abdomen negative. CXR bilateral upper lobe infiltrates. WBC 18K.   Now w/ component of cardiogenic shock. Co-ox obtain and markedly low, 35%. Bedside echo shows reduced LVEF. Compete echocardiogram ordered. CCM started milrinone.   Current drips: milrinone 0.125, VP 0.04, NE 22, LR 150 ml/hr, 0.9 NaCl 10 ml/hr + bicarb 75 ml/hr.   MAPs ~90 on A-line. SCr 4.13>>3.70  Intubated and sedated, FiO2 40%.   Echo pending   No family currently present at beside, but per HPI son reported h/o poor med compliance.    Review of Systems: Unable to obtain given patient state, currently intubated and sedated   General: Weight gain [ ] ; Weight loss [ ] ; Anorexia [ ] ; Fatigue [ ] ; Fever [ ] ; Chills [ ] ; Weakness [ ]   Cardiac: Chest pain/pressure [ ] ; Resting SOB [ ] ; Exertional SOB [ ] ; Orthopnea [ ] ; Pedal Edema [ ] ; Palpitations [ ] ; Syncope [ ] ; Presyncope [ ] ; Paroxysmal nocturnal dyspnea[ ]   Pulmonary: Cough [ ] ; Wheezing[ ] ; Hemoptysis[ ] ; Sputum [ ] ; Snoring [ ]   GI: Vomiting[ ] ; Dysphagia[ ] ; Melena[ ] ; Hematochezia [ ] ; Heartburn[ ] ; Abdominal pain [ ] ; Constipation [ ] ; Diarrhea [ ] ; BRBPR [ ]   GU:  Hematuria[ ] ; Dysuria [ ] ; Nocturia[ ]   Vascular: Pain in legs with walking [ ] ; Pain in feet with lying flat [ ] ; Non-healing sores [ ] ; Stroke [ ] ; TIA [ ] ; Slurred speech [ ] ;  Neuro: Headaches[ ] ; Vertigo[ ] ; Seizures[ ] ; Paresthesias[ ] ;Blurred vision [ ] ; Diplopia [ ] ; Vision changes [ ]   Ortho/Skin: Arthritis [ ] ; Joint pain [ ] ; Muscle pain [ ] ; Joint swelling [ ] ; Back Pain [ ] ; Rash [ ]   Psych: Depression[ ] ; Anxiety[ ]   Heme: Bleeding problems [ ] ; Clotting disorders [ ] ; Anemia [ ]   Endocrine: Diabetes [ ] ; Thyroid dysfunction[ ]   Home Medications Prior to Admission medications   Medication Sig Start Date End Date Taking? Authorizing Provider  albuterol (VENTOLIN HFA) 108 (90 Base) MCG/ACT inhaler Inhale 1-2 puffs into the lungs every 6 (six) hours as needed for wheezing or shortness of breath. 10/12/20   Rema Fendt, NP  ferrous sulfate 325 (65 FE) MG tablet Take 1 tablet (325 mg total) by mouth 2 (two) times daily with a meal. 12/09/21   Dorcas Carrow, MD  FLUoxetine (PROZAC) 40 MG capsule Take 2 capsules (80 mg total) by mouth daily. 05/18/21   Nwoko, Tommas Olp, PA  fluticasone furoate-vilanterol (BREO ELLIPTA) 200-25 MCG/ACT AEPB inhale 1 puff into the lungs daily 10/12/20   Rema Fendt, NP  gabapentin (NEURONTIN) 400 MG capsule Take 1 capsule (  400 mg total) by mouth at bedtime. 05/18/21   Nwoko, Tommas Olp, PA  losartan-hydrochlorothiazide (HYZAAR) 100-25 MG tablet Take 1 tablet by mouth daily.    [provider]  mirtazapine (REMERON) 30 MG tablet Take 1 tablet (30 mg total) by mouth at bedtime. 05/18/21   Nwoko, Tommas Olp, PA  potassium chloride SA (KLOR-CON M) 20 MEQ tablet Take 2 tablets (40 mEq total) by mouth daily for 3 days. 12/09/21 12/14/21  Dorcas Carrow, MD  QUEtiapine (SEROQUEL) 300 MG tablet Take 1 tablet (300 mg total) by mouth at bedtime. 12/09/21   Dorcas Carrow, MD  Ipratropium-Albuterol (COMBIVENT RESPIMAT) 20-100 MCG/ACT AERS respimat Inhale 1 puff  into the lungs every 6 (six) hours. Patient not taking: No sig reported 12/15/19 04/22/20  Storm Frisk, MD    Past Medical History: Past Medical History:  Diagnosis Date   Bronchitis    COPD (chronic obstructive pulmonary disease) (HCC)    Hypertension     Past Surgical History: History reviewed. No pertinent surgical history.  Family History: Family History  Problem Relation Age of Onset   Diabetes Mother    Diabetes Father     Social History: Social History   Socioeconomic History   Marital status: Married    Spouse name: Not on file   Number of children: Not on file   Years of education: Not on file   Highest education level: Not on file  Occupational History   Not on file  Tobacco Use   Smoking status: Never   Smokeless tobacco: Never  Vaping Use   Vaping Use: Never used  Substance and Sexual Activity   Alcohol use: Not Currently   Drug use: Never   Sexual activity: Not Currently  Other Topics Concern   Not on file  Social History Narrative   Not on file   Social Determinants of Health   Financial Resource Strain: Not on file  Food Insecurity: Not on file  Transportation Needs: Not on file  Physical Activity: Not on file  Stress: Not on file  Social Connections: Not on file    Allergies:  No Known Allergies  Objective:    Vital Signs:   Temp:  [97.7 F (36.5 C)-102 F (38.9 C)] 98.2 F (36.8 C) (11/06 1200) Pulse Rate:  [85-155] 113 (11/06 1215) Resp:  [18-40] 30 (11/06 1230) BP: (61-169)/(50-110) 169/110 (11/06 1145) SpO2:  [76 %-100 %] 92 % (11/06 1215) FiO2 (%):  [40 %-100 %] 40 % (11/06 1200) Weight:  [40.8 kg-42.2 kg] 42.2 kg (11/06 0436) Last BM Date : 01/29/22  Weight change: Filed Weights   01/28/22 2220 01/29/22 0436  Weight: 40.8 kg 42.2 kg    Intake/Output:   Intake/Output Summary (Last 24 hours) at 01/29/2022 1314 Last data filed at 01/29/2022 1200 Gross per 24 hour  Intake 5373.58 ml  Output 120 ml  Net 5253.58  ml      Physical Exam    General:  thin ill appearing, intubated and sedated HEENT: + ETT  Neck: supple. JVP elevated to jaw . Carotids 2+ bilat; no bruits. No lymphadenopathy or thyromegaly appreciated. Cor: PMI nondisplaced. Regular rate & rhythm. No rubs, gallops or murmurs. Lungs: intubated, decreased BS at the bases  Abdomen: soft, nontender, nondistended. No hepatosplenomegaly. No bruits or masses. Good bowel sounds. Extremities: no cyanosis, clubbing, rash, edema Neuro: intubated and sedated    Telemetry   NSR 90s  EKG    Admit EKG showed tachycardia 136 bpm, suspect sinus but  lots of artifact   Labs   Basic Metabolic Panel: Recent Labs  Lab 01/28/22 2234 01/29/22 0537 01/29/22 0921 01/29/22 1149  NA 139 135 137 135  K 3.6 3.1* 3.2* 3.7  CL 102 108  --   --   CO2 14* 9*  --   --   GLUCOSE 90 102*  --   --   BUN 51* 49*  --   --   CREATININE 4.13* 3.70*  --   --   CALCIUM 10.2 8.5*  --   --   MG  --  1.3*  --   --   PHOS  --  2.6  --   --     Liver Function Tests: Recent Labs  Lab 01/28/22 2234 01/29/22 0537  AST 52* 58*  ALT 27 23  ALKPHOS 159* 65  BILITOT 1.5* 1.5*  PROT 6.9 4.7*  ALBUMIN 3.4* 2.2*   No results for input(s): "LIPASE", "AMYLASE" in the last 168 hours. Recent Labs  Lab 01/29/22 0537  AMMONIA 36*    CBC: Recent Labs  Lab 01/28/22 2234 01/29/22 0841 01/29/22 0921 01/29/22 1149  WBC 1.3* 18.3*  --   --   NEUTROABS 0.8* 15.2*  --   --   HGB 9.3* 6.5* 6.5* 6.5*  HCT 28.6* 19.0* 19.0* 19.0*  MCV 63.7* 61.5*  --   --   PLT 70* 60*  --   --     Cardiac Enzymes: Recent Labs  Lab 01/29/22 0537  CKTOTAL 886*    BNP: BNP (last 3 results) No results for input(s): "BNP" in the last 8760 hours.  ProBNP (last 3 results) No results for input(s): "PROBNP" in the last 8760 hours.   CBG: Recent Labs  Lab 01/29/22 0419 01/29/22 0809 01/29/22 1230  GLUCAP 75 110* 136*    Coagulation Studies: Recent Labs     01/28/22 2234  LABPROT 20.3*  INR 1.8*     Imaging   DG CHEST PORT 1 VIEW  Result Date: 01/29/2022 CLINICAL DATA:  Orogastric placement EXAM: PORTABLE CHEST 1 VIEW COMPARISON:  Earlier same day FINDINGS: Endotracheal tube tip 1 cm above the carina. Orogastric tube enters the stomach and doubles back with the tip re-entering the distal esophagus. Right internal jugular central line tip in the proximal right atrium. Widespread pulmonary infiltrates/edema as seen previously. IMPRESSION: 1. Orogastric tube enters the stomach and doubles back with the tip re-entering the distal esophagus. 2. Endotracheal tube tip 1 cm above the carina. 3. New central line tip in the proximal right atrium. No pneumothorax. 4. Widespread pulmonary infiltrates/edema persist. Electronically Signed   By: Paulina Fusi M.D.   On: 01/29/2022 09:49   DG Chest Port 1 View  Result Date: 01/29/2022 CLINICAL DATA:  Shortness of breath EXAM: PORTABLE CHEST 1 VIEW COMPARISON:  Previous studies including the examination done earlier today FINDINGS: There is interval placement of endotracheal tube with its tip at the origin of right main bronchus. Cardiac size is within normal limits. There are new patchy infiltrates in both upper lung fields, more so on the right side. Linear densities are seen in medial left lower lung fields suggesting subsegmental atelectasis. There is no pleural effusion or pneumothorax. There is elevation of minor fissure on the right side suggesting decreased volume in right upper lobe. Increased markings in the apices may suggest scarring. IMPRESSION: Tip of endotracheal tube is noted at the origin of right main bronchus and should be pulled back 3 cm. This finding  was relayed to patient's provider by telephone call at 8:26 a.m. on 01/29/2022. There are new patchy infiltrates in both upper lung fields, more so on the right side suggesting atelectasis/pneumonia. Linear densities in medial left lower lung fields  suggest subsegmental atelectasis. Electronically Signed   By: Ernie Avena M.D.   On: 01/29/2022 08:28   CT HEAD WO CONTRAST ( )  Result Date: 01/29/2022 CLINICAL DATA:  Confusion/delirium EXAM: CT HEAD WITHOUT CONTRAST TECHNIQUE: Contiguous axial images were obtained from the base of the skull through the vertex without intravenous contrast. RADIATION DOSE REDUCTION: This exam was performed according to the departmental dose-optimization program which includes automated exposure control, adjustment of the mA and/or kV according to patient size and/or use of iterative reconstruction technique. COMPARISON:  12/06/2021 FINDINGS: Brain: No evidence of acute infarction, hemorrhage, hydrocephalus, extra-axial collection or mass lesion/mass effect. Mild subcortical white matter and periventricular small vessel ischemic changes. Vascular: No hyperdense vessel or unexpected calcification. Skull: Normal. Negative for fracture or focal lesion. Sinuses/Orbits: The visualized paranasal sinuses are essentially clear. The mastoid air cells are unopacified. Other: None. IMPRESSION: No evidence of acute intracranial abnormality. Mild small vessel ischemic changes. Electronically Signed   By: Charline Bills M.D.   On: 01/29/2022 01:08   CT ABDOMEN PELVIS WO CONTRAST  Result Date: 01/29/2022 CLINICAL DATA:  Sepsis EXAM: CT ABDOMEN AND PELVIS WITHOUT CONTRAST TECHNIQUE: Multidetector CT imaging of the abdomen and pelvis was performed following the standard protocol without IV contrast. RADIATION DOSE REDUCTION: This exam was performed according to the departmental dose-optimization program which includes automated exposure control, adjustment of the mA and/or kV according to patient size and/or use of iterative reconstruction technique. COMPARISON:  12/06/2021 FINDINGS: Motion degraded images. Lower chest: 5 mm right lower lobe pulmonary nodule (series 5/image 2), grossly unchanged when accounting for motion  degradation. No follow-up is recommended. Hepatobiliary: Liver is notable for mild hepatic steatosis. Cholelithiasis.  No intrahepatic or extrahepatic duct dilatation. Pancreas: Grossly unremarkable. Spleen: Within normal limits. Adrenals/Urinary Tract: Adrenal glands are within normal limits. 17 mm nonobstructing left lower pole renal calculus. Kidneys are otherwise grossly unremarkable. No hydronephrosis. Bladder is underdistended but unremarkable. Stomach/Bowel: Stomach is notable for a small hiatal hernia. No evidence of bowel obstruction. Appendix is grossly unremarkable (series 3/image 43). Moderate left colonic stool burden, suggesting constipation. Vascular/Lymphatic: No evidence of abdominal aortic aneurysm. No suspicious abdominopelvic lymphadenopathy. Reproductive: Calcified uterine fibroids. No adnexal masses. Other: No abdominopelvic ascites. Musculoskeletal: Visualized osseous structures are within normal limits. IMPRESSION: Motion degraded images. 17 mm nonobstructing left lower pole renal calculus. No hydronephrosis. Additional stable ancillary findings as above. Electronically Signed   By: Charline Bills M.D.   On: 01/29/2022 01:07   DG Chest Port 1 View  Result Date: 01/29/2022 CLINICAL DATA:  Code sepsis. EXAM: PORTABLE CHEST 1 VIEW COMPARISON:  December 06, 2021 FINDINGS: The heart size and mediastinal contours are within normal limits. The lungs are hyperinflated with mild biapical atelectasis and pleural thickening. There is no evidence of an acute infiltrate, pleural effusion or pneumothorax. The visualized skeletal structures are unremarkable. IMPRESSION: COPD without acute or active cardiopulmonary disease. Electronically Signed   By: Aram Candela M.D.   On: 01/29/2022 00:46     Medications:     Current Medications:  sodium chloride   Intravenous Once   arformoterol  15 mcg Nebulization BID   budesonide (PULMICORT) nebulizer solution  0.25 mg Nebulization BID    Chlorhexidine Gluconate Cloth  6 each Topical Q0600  fentaNYL (SUBLIMAZE) injection  50 mcg Intravenous Once   hydrocortisone sod succinate (SOLU-CORTEF) inj  100 mg Intravenous Q8H   mouth rinse  15 mL Mouth Rinse Q2H   pantoprazole (PROTONIX) IV  40 mg Intravenous Q12H   vancomycin variable dose per unstable renal function (pharmacist dosing)   Does not apply See admin instructions    Infusions:  sodium chloride 10 mL/hr at 01/29/22 1200   sodium chloride Stopped (01/29/22 0431)   cefTRIAXone (ROCEPHIN)  IV     fentaNYL infusion INTRAVENOUS 100 mcg/hr (01/29/22 1200)   lactated ringers 150 mL/hr at 01/29/22 1200   milrinone 0.125 mcg/kg/min (01/29/22 1200)   norepinephrine (LEVOPHED) Adult infusion 22 mcg/min (01/29/22 1200)   sodium bicarbonate 150 mEq in dextrose 5 % 1,150 mL infusion 75 mL/hr at 01/29/22 1200   vasopressin 0.04 Units/min (01/29/22 1200)      Patient Profile   61 y/o AAF w/ h/o HTN, COPD, anemia, h/o poor med compliance and prior admission to [facility] 9/23 for UTI and pyelonephritis, admitted w/ mixed septic and cardiogenic shock.   Assessment/Plan   1. Shock w/ Lactic Acidosis  - mixed septic + CGS.  LA > 9.0, Temp 102,  WBC 18K, PCT >150. Co-ox 35%  - BCx + for Enterobacterials and E. Coli. UCx pending. CXR w/ bilateral upper lobe infiltrates - continue Vanc + cefepime  - continue NE + VP to support MAP, aim to keep > 70 to help w/ renal perfusion  - continue milrinone 0.125. Will obtain repeat co-ox. If remains < 55%, will further titrate dose - follow lactate for clearance   2. Acute Systolic Heart Failure - Echo 1/23 LVEF normal  - bedside echo today w/ depressed EF, in CGS w/ Co-ox 35% - suspect likely stressed induced in setting of sepsis  - continue milrinone support and follow co-ox - set up CVP to help w/ fluid monitoring  - GDMT limited by hypotension and AKI  - once improved from infectious standpoint, will plan to get repeat echo to see if any  interval improvement in EF. If remains low, will need additional w/u including R/LHC and cMRI once renal function improves   3. AKI  -SCr 4.13 on admit>>3.70 on repeat  -suspect due to hypotension from shock -continue IVF resuscitation + pressor and inotropic support -keep MAP > 70  -follow SCr closely   4. Bacteremia - BCx + for Enterobacterials and E. Coli - will need TEE to r/o endocarditis  - continue IV abx  - consult ID   5. Anemia  - Hgb 6.5  - no gross bleeding  - transfuse 1 uRBC  - needs anemia w/u  - follow CBC  6. Acute on Chronic Thrombocytopenia - last b/l was low at 108K (9/23) - 70>>60K this admit - suspect due to critical illness  - follow closely   7. Acute Hypoxic Respiratory Failure - intubated, vent management per PCCM  - CXR w/ bilateral upper lobe infiltrates - continue abx   8. Hypokalemia/Hypomagnesemia  - K 3.2>>IV supp>>3.7 on repeat  - Mg 1.3 - gentle Mg supp w/ elevated SCr    Length of Stay: 0  Brittainy Simmons, PA-C  01/29/2022, 1:14 PM  Advanced Heart Failure Team Pager [phone] (M-F; 7a - 5p)  Please contact [facility] for night-coverage after hours (4p -7a ) and weekends on [email or web address]   Patient seen with PA, agree with the above note.   No significant prior cardiac history.  Patient was admitted yesterday  with fever/altered MS/hypotension.  Lactate >9, Tm 102.  Septic shock suspected.  PCT > 150, initial creatinine 4.13.  Blood cultures have grown E coli.  Of note, patient was admitted with UTI/pyelonephritis in 9/23.  CXR with bilateral infiltrates.  Vancomycin/cefepime started.  Patient was intubated.   Currently, patient is on NE 19, vasopressin 0.04, milrinone 0.125 with co-ox 35%.  She is in HCO3 gtt.  I reviewed her echocardiogram, EF 20-25% with preservation of apical function but severe hypokinesis of other walls, normal RV, dilated IVC.   General: Intubated/sedated.  Neck: JVP 14+, no thyromegaly or thyroid nodule.   Lungs: Decreased BS dependently.  CV: Nondisplaced PMI.  Heart regular S1/S2, no S3/S4, no murmur.  No peripheral edema.  No carotid bruit.  Normal pedal pulses.  Abdomen: Soft, no hepatosplenomegaly, no distention.  Skin: Intact without lesions or rashes.  Neurologic: Sedated on vent.  Extremities: No clubbing or cyanosis.  HEENT: Normal.    1. Shock: Suspect mixed septic shock and cardiogenic shock.  Lactate was > 9 with PCT > 150.  E coli bacteremia with echo showing EF 20-25%.   - Broad spectrum abx.  - Support MAP with pressors, currently on NE 19 and vasopressin 0.04.  - Would replace milrinone with dobutamine 5 mcg/kg/min given high pressor requirement.   - Continue HCO3 gtt per CCM.  2. Acute systolic CHF: Echo showed EF 20-25% with preservation of apical function but severe hypokinesis of other walls, normal RV, dilated IVC.  Unusual wall motion abnormality pattern, prior echo was normal.  ?Septic cardiomyopathy with reverse Takotsubo-type pattern.  HS-TnI was minimally elevated with no trend so doubt ACS.  - Support MAP with pressors as above.  - Stop milrinone, start dobutamine 5. Follow co-ox with change.  - IVC dilated, she is fluid replete.  Would not push fluid boluses.  Eventually will need diuresis.  - Would plan for LHC/RHC when stabilized and creatinine improved to rule out CAD.  3. AKI: In setting of septic shock.  Creatinine 4.13 => 3.5, some improvement with maintenance of MAP.   - Maintain MAP and CO.  - Follow BMET closely, has HD catheter but does not need at this time.  4. ID: E coli bacteremia.  PCT > 150, WBCs 18.  CXR with bilateral infiltrates.  Had recent admission with pyelonephritis.  - Vancomycin/cefepime.  5. Anemia: Hgb 6.5, not overtly bleeding. getting 1 unit.  6. Thrombocytopenia: plts 60K, suspect due to inflammation/sepsis.  7. Acute hypoxemic respiratory failure: PNA on CXR, also suspect pulmonary edema. Intubated.  - Per CCM.   Marca Ancona 01/29/2022 3:39 PM

## 2022-01-29 NOTE — Progress Notes (Signed)
Per CCM order, RT pulled back ETT 3cm. Placing ETT now 22 at lip.

## 2022-01-29 NOTE — Procedures (Signed)
Arterial Line Insertion Start/End11/08/2021 5:28 AM  Patient location: ICU. Preanesthetic checklist: patient identified, site marked, risks and benefits discussed, monitors and equipment checked and timeout performed Right, radial was placed Catheter size: 20 G Hand hygiene performed  and maximum sterile barriers used  Allen's test indicative of satisfactory collateral circulation Attempts: 1 Procedure performed without using ultrasound guided technique. Following insertion, dressing applied and Biopatch. Post procedure assessment: normal  Patient tolerated the procedure well with no immediate complications.  Alarms Checked

## 2022-01-29 NOTE — Progress Notes (Signed)
Critical Hb results were given to CCM and RN.

## 2022-01-29 NOTE — Progress Notes (Signed)
Pt c/o CP/SOB and became very agitated despite precedex gtt. 12L EKG done showing ST. While getting EKG, pt developed congested breathing and SpO2 dropped to 70s on RA. Pt placed on NRB with SpO2 increasing to 100%. [place] RN notified. Rounding MD notified by [facility] RN. Respiratory to bedside to assist. Breathing tx given and PCXR ordered. Colletta Maryland, PCCM PA and Dr. Elsworth Soho, PCCM MD to bedside. Decision made to intubate pt. Pt intubated at 0810 after being given 115mcg fent, 20mg  etomidate, and 1mg  versed. Report given to Nate, Therapist, sports.

## 2022-01-29 NOTE — Procedures (Signed)
Central Venous Catheter Insertion Procedure Note  JARIYAH HACKLEY  [id number]  01/14/1961  Date:01/29/22  Time:9:33 AM   Provider Performing:Freeda Spivey Jerilynn Mages Ayesha Rumpf   Procedure: Insertion of Non-tunneled Central Venous Catheter(36556)with US guidance (41962)    Indication(s): Medication administration, Difficult access, and Hemodialysis  Consent: Unable to obtain consent due to emergent nature of procedure.  Anesthesia: Topical only with 1% lidocaine   Timeout: Verified patient identification, verified procedure, site/side was marked, verified correct patient position, special equipment/implants available, medications/allergies/relevant history reviewed, required imaging and test results available.  Sterile Technique Maximal sterile technique including full sterile barrier drape, hand hygiene, sterile gown, sterile gloves, mask, hair covering, sterile ultrasound probe cover (if used).  Procedure Description: Area of catheter insertion was cleaned with chlorhexidine and draped in sterile fashion.   With real-time ultrasound guidance a HD catheter was placed into the right internal jugular vein.  Nonpulsatile blood flow and easy flushing noted in all ports.  The catheter was sutured in place and sterile dressing applied.    Complications/Tolerance: None; patient tolerated the procedure well. Chest X-ray is ordered to verify placement for internal jugular or subclavian cannulation.  Chest x-ray is not ordered for femoral cannulation.  EBL: Minimal  Specimen(s): None  Lestine Mount, [place] [facility] 01/29/22 9:35 AM  Please see [email or web address] for pager details.  From 7A-7P if no response, please call [phone] After hours, please call ELink [phone]

## 2022-01-30 ENCOUNTER — Inpatient Hospital Stay (HOSPITAL_COMMUNITY): Payer: Self-pay

## 2022-01-30 DIAGNOSIS — R570 Cardiogenic shock: Secondary | ICD-10-CM

## 2022-01-30 LAB — BASIC METABOLIC PANEL
Anion gap: 17 — ABNORMAL HIGH (ref 5–15)
Anion gap: 20 — ABNORMAL HIGH (ref 5–15)
Anion gap: 23 — ABNORMAL HIGH (ref 5–15)
BUN: 51 mg/dL — ABNORMAL HIGH (ref 8–23)
BUN: 52 mg/dL — ABNORMAL HIGH (ref 8–23)
BUN: 57 mg/dL — ABNORMAL HIGH (ref 8–23)
CO2: 18 mmol/L — ABNORMAL LOW (ref 22–32)
CO2: 22 mmol/L (ref 22–32)
CO2: 27 mmol/L (ref 22–32)
Calcium: 7.2 mg/dL — ABNORMAL LOW (ref 8.9–10.3)
Calcium: 7.4 mg/dL — ABNORMAL LOW (ref 8.9–10.3)
Calcium: 8 mg/dL — ABNORMAL LOW (ref 8.9–10.3)
Chloride: 91 mmol/L — ABNORMAL LOW (ref 98–111)
Chloride: 94 mmol/L — ABNORMAL LOW (ref 98–111)
Chloride: 98 mmol/L (ref 98–111)
Creatinine, Ser: 2.8 mg/dL — ABNORMAL HIGH (ref 0.44–1.00)
Creatinine, Ser: 2.92 mg/dL — ABNORMAL HIGH (ref 0.44–1.00)
Creatinine, Ser: 3.08 mg/dL — ABNORMAL HIGH (ref 0.44–1.00)
GFR, Estimated: 16 mL/min — ABNORMAL LOW (ref >60)
GFR, Estimated: 17 mL/min — ABNORMAL LOW (ref >60)
GFR, Estimated: 18 mL/min — ABNORMAL LOW (ref >60)
Glucose, Bld: 125 mg/dL — ABNORMAL HIGH (ref 70–99)
Glucose, Bld: 185 mg/dL — ABNORMAL HIGH (ref 70–99)
Glucose, Bld: 405 mg/dL — ABNORMAL HIGH (ref 70–99)
Potassium: 3.1 mmol/L — ABNORMAL LOW (ref 3.5–5.1)
Potassium: 3.4 mmol/L — ABNORMAL LOW (ref 3.5–5.1)
Potassium: 3.7 mmol/L (ref 3.5–5.1)
Sodium: 135 mmol/L (ref 135–145)
Sodium: 137 mmol/L (ref 135–145)
Sodium: 138 mmol/L (ref 135–145)

## 2022-01-30 LAB — BLOOD GAS, ARTERIAL
Acid-base deficit: 12.7 mmol/L — ABNORMAL HIGH (ref 0.0–2.0)
Bicarbonate: 8.9 mmol/L — ABNORMAL LOW (ref 20.0–28.0)
Drawn by: 164
O2 Saturation: 98.2 %
Patient temperature: 36.7
pCO2 arterial: <18 mmHg — CL (ref 32–48)
pH, Arterial: 7.41 (ref 7.35–7.45)
pO2, Arterial: 99 mmHg (ref 83–108)

## 2022-01-30 LAB — COOXEMETRY PANEL
Carboxyhemoglobin: 1.5 % (ref 0.5–1.5)
Methemoglobin: <0.7 % (ref 0.0–1.5)
O2 Saturation: 59.4 %
Total hemoglobin: 8 g/dL — ABNORMAL LOW (ref 12.0–16.0)

## 2022-01-30 LAB — POCT I-STAT 7, (LYTES, BLD GAS, ICA,H+H)
Acid-Base Excess: 4 mmol/L — ABNORMAL HIGH (ref 0.0–2.0)
Acid-Base Excess: 4 mmol/L — ABNORMAL HIGH (ref 0.0–2.0)
Bicarbonate: 24.6 mmol/L (ref 20.0–28.0)
Bicarbonate: 25.7 mmol/L (ref 20.0–28.0)
Calcium, Ion: 0.95 mmol/L — ABNORMAL LOW (ref 1.15–1.40)
Calcium, Ion: 0.97 mmol/L — ABNORMAL LOW (ref 1.15–1.40)
HCT: 23 % — ABNORMAL LOW (ref 36.0–46.0)
HCT: 23 % — ABNORMAL LOW (ref 36.0–46.0)
Hemoglobin: 7.8 g/dL — ABNORMAL LOW (ref 12.0–15.0)
Hemoglobin: 7.8 g/dL — ABNORMAL LOW (ref 12.0–15.0)
O2 Saturation: 100 %
O2 Saturation: 97 %
Patient temperature: 99.9
Potassium: 3.7 mmol/L (ref 3.5–5.1)
Potassium: 3.7 mmol/L (ref 3.5–5.1)
Sodium: 136 mmol/L (ref 135–145)
Sodium: 136 mmol/L (ref 135–145)
TCO2: 25 mmol/L (ref 22–32)
TCO2: 27 mmol/L (ref 22–32)
pCO2 arterial: 22 mmHg — ABNORMAL LOW (ref 32–48)
pCO2 arterial: 28.9 mmHg — ABNORMAL LOW (ref 32–48)
pH, Arterial: 7.656 (ref 7.35–7.45)
pH, Arterial: 7.559 — ABNORMAL HIGH (ref 7.35–7.45)
pO2, Arterial: 129 mmHg — ABNORMAL HIGH (ref 83–108)
pO2, Arterial: 83 mmHg (ref 83–108)

## 2022-01-30 LAB — DIC (DISSEMINATED INTRAVASCULAR COAGULATION)PANEL
D-Dimer, Quant: >20 ug/mL-FEU — ABNORMAL HIGH (ref 0.00–0.50)
Fibrinogen: 579 mg/dL — ABNORMAL HIGH (ref 210–475)
INR: 2.2 — ABNORMAL HIGH (ref 0.8–1.2)
Platelets: 24 10*3/uL — CL (ref 150–400)
Prothrombin Time: 23.8 seconds — ABNORMAL HIGH (ref 11.4–15.2)
Smear Review: NONE SEEN
aPTT: 37 seconds — ABNORMAL HIGH (ref 24–36)

## 2022-01-30 LAB — CBC WITH DIFFERENTIAL/PLATELET
Abs Immature Granulocytes: 0.08 10*3/uL — ABNORMAL HIGH (ref 0.00–0.07)
Basophils Absolute: 0.1 10*3/uL (ref 0.0–0.1)
Basophils Relative: 1 %
Eosinophils Absolute: 0 10*3/uL (ref 0.0–0.5)
Eosinophils Relative: 0 %
HCT: 23.3 % — ABNORMAL LOW (ref 36.0–46.0)
Hemoglobin: 8.2 g/dL — ABNORMAL LOW (ref 12.0–15.0)
Immature Granulocytes: 1 %
Lymphocytes Relative: 4 %
Lymphs Abs: 0.5 10*3/uL — ABNORMAL LOW (ref 0.7–4.0)
MCH: 22.1 pg — ABNORMAL LOW (ref 26.0–34.0)
MCHC: 35.2 g/dL (ref 30.0–36.0)
MCV: 62.8 fL — ABNORMAL LOW (ref 80.0–100.0)
Monocytes Absolute: 0.2 10*3/uL (ref 0.1–1.0)
Monocytes Relative: 1 %
Neutro Abs: 13.1 10*3/uL — ABNORMAL HIGH (ref 1.7–7.7)
Neutrophils Relative %: 93 %
Platelets: 23 10*3/uL — CL (ref 150–400)
RBC: 3.71 MIL/uL — ABNORMAL LOW (ref 3.87–5.11)
RDW: 25.2 % — ABNORMAL HIGH (ref 11.5–15.5)
WBC: 14 10*3/uL — ABNORMAL HIGH (ref 4.0–10.5)
nRBC: 0.6 % — ABNORMAL HIGH (ref 0.0–0.2)

## 2022-01-30 LAB — CBC
HCT: 20.6 % — ABNORMAL LOW (ref 36.0–46.0)
Hemoglobin: 7.2 g/dL — ABNORMAL LOW (ref 12.0–15.0)
MCH: 21.8 pg — ABNORMAL LOW (ref 26.0–34.0)
MCHC: 35 g/dL (ref 30.0–36.0)
MCV: 62.4 fL — ABNORMAL LOW (ref 80.0–100.0)
Platelets: 17 10*3/uL — CL (ref 150–400)
RBC: 3.3 MIL/uL — ABNORMAL LOW (ref 3.87–5.11)
RDW: 25 % — ABNORMAL HIGH (ref 11.5–15.5)
WBC: 13.7 10*3/uL — ABNORMAL HIGH (ref 4.0–10.5)
nRBC: 0.3 % — ABNORMAL HIGH (ref 0.0–0.2)

## 2022-01-30 LAB — GLUCOSE, CAPILLARY
Glucose-Capillary: 120 mg/dL — ABNORMAL HIGH (ref 70–99)
Glucose-Capillary: 16 mg/dL — CL (ref 70–99)
Glucose-Capillary: 160 mg/dL — ABNORMAL HIGH (ref 70–99)
Glucose-Capillary: 177 mg/dL — ABNORMAL HIGH (ref 70–99)
Glucose-Capillary: 178 mg/dL — ABNORMAL HIGH (ref 70–99)
Glucose-Capillary: 195 mg/dL — ABNORMAL HIGH (ref 70–99)
Glucose-Capillary: 214 mg/dL — ABNORMAL HIGH (ref 70–99)
Glucose-Capillary: 85 mg/dL (ref 70–99)
Glucose-Capillary: <10 mg/dL — CL (ref 70–99)

## 2022-01-30 LAB — PHOSPHORUS: Phosphorus: 2.6 mg/dL (ref 2.5–4.6)

## 2022-01-30 LAB — PREPARE RBC (CROSSMATCH)

## 2022-01-30 LAB — LACTIC ACID, PLASMA
Lactic Acid, Venous: 7.8 mmol/L (ref 0.5–1.9)
Lactic Acid, Venous: 8.1 mmol/L (ref 0.5–1.9)

## 2022-01-30 LAB — MAGNESIUM
Magnesium: 2 mg/dL (ref 1.7–2.4)
Magnesium: 2.2 mg/dL (ref 1.7–2.4)

## 2022-01-30 MED ORDER — VITAL HIGH PROTEIN PO LIQD: 1000.0000 mL | ORAL | Status: DC

## 2022-01-30 MED ORDER — POTASSIUM CHLORIDE 10 MEQ/50ML IV SOLN
10.0000 meq | INTRAVENOUS | Status: AC
  Administered 2022-01-30 (×3): 10 meq via INTRAVENOUS
  Filled 2022-01-30 (×3): qty 50

## 2022-01-30 MED ORDER — VITAL 1.5 CAL PO LIQD
1000.0000 mL | ORAL | Status: DC
  Administered 2022-01-31: 1000 mL
  Filled 2022-01-30: qty 1000

## 2022-01-30 MED ORDER — INSULIN ASPART 100 UNIT/ML IJ SOLN
1.0000 [IU] | INTRAMUSCULAR | Status: DC
  Administered 2022-01-30 (×2): 2 [IU] via SUBCUTANEOUS
  Administered 2022-01-30: 3 [IU] via SUBCUTANEOUS

## 2022-01-30 MED ORDER — INSULIN ASPART 100 UNIT/ML IJ SOLN
0.0000 [IU] | INTRAMUSCULAR | Status: DC
  Administered 2022-01-30 – 2022-01-31 (×2): 3 [IU] via SUBCUTANEOUS

## 2022-01-30 MED ORDER — PEPTAMEN 1.5 CAL PO LIQD
1000.0000 mL | ORAL | Status: DC
  Filled 2022-01-30: qty 1000

## 2022-01-30 MED ORDER — SODIUM CHLORIDE 0.9% IV SOLUTION: Freq: Once | INTRAVENOUS | Status: AC

## 2022-01-30 MED ORDER — ANTICOAGULANT SODIUM CITRATE 4% (200MG/5ML) IV SOLN
5.0000 mL | Freq: Once | Status: AC
  Administered 2022-01-30: 5 mL
  Filled 2022-01-30: qty 5

## 2022-01-30 NOTE — Progress Notes (Signed)
Nutrition Follow-up  DOCUMENTATION CODES:   Underweight, Severe malnutrition in context of chronic illness  INTERVENTION:   Once OG tube repositioned/replaced and gastric placement confirmed via x-ray, initiate trickle tube feeds: - Peptamen 1.5 @ 20 ml/hr (480 ml/day)   Goal tube feeding regimen: - Peptamen 1.5 @ 50 ml/hr (1200 ml/day)   Recommended tube feeding regimen at goal rate would provide 1800 kcal, 82 grams of protein, and 922 ml of H2O.  NUTRITION DIAGNOSIS:   Severe Malnutrition related to chronic illness (COPD) as evidenced by severe muscle depletion, severe fat depletion.  Ongoing, being addressed via initiation of trickle TF  GOAL:   Patient will meet greater than or equal to 90% of their needs  Unmet at this time, being addressed via initiation of trickle TF  MONITOR:   Vent status, Labs, Weight trends, TF tolerance, I & O's  REASON FOR ASSESSMENT:   Consult Enteral/tube feeding initiation and management (trickle tube feeds)  ASSESSMENT:   61 year old female who presented to the ED on 11/05 with AMS. PMH of COPD, anemia, HTN. Pt admitted with septic shock, AKI.  11/06 - intubation  Discussed pt with RN and during ICU rounds. Pt with E. coli bacteremia, mixed cardiogenic and septic shock. UOP has improved over the last 12 hours per CCM. OG tube remains coiled with tip in esophagus per most recent abdominal x-ray. RN reports that tube has been adjusted several times without success. Plan is to pull OG tube and replace with Cortrak.  Admit weight: 40.8 kg Current weight: 46 kg  Patient remains intubated on ventilator support MV: 12.8 L/min Temp (24hrs), Avg:97.5 F (36.4 C), Min:96.6 F (35.9 C), Max:98.2 F (36.8 C)  Drips: Fentanyl Dobutamine: 7.5 mcg/kg/min Levophed: 24 mcg/min Vasopressin: 0.04 units/min Sodium bicarb: 75 ml/hr  Medications reviewed and include: IV solu-cortef, novolog 1-3 units q 4 hours, IV protonix, IV abx, IV KCl 10  mEq x 3  Labs reviewed: BUN 51, creatinine 2.92, ionized calcium 0.95, lactic acid 8.1, WBC 13.7, hemoglobin 7.8, platelets 17 CBG's: <10-195 x 24 hours  UOP: 830 ml x 24 hours I/O's: +7.6 L since admit  Diet Order:   Diet Order             Diet NPO time specified  Diet effective now                   EDUCATION NEEDS:   Not appropriate for education at this time  Skin:  Skin Assessment: Reviewed RN Assessment  Last BM:  01/29/22 small type 7  Height:   Ht Readings from Last 1 Encounters:  01/28/22 5\' 4"  (1.626 m)    Weight:   Wt Readings from Last 1 Encounters:  01/30/22 46 kg    Ideal Body Weight:  54.5 kg  BMI:  Body mass index is 17.41 kg/m.  Estimated Nutritional Needs:   Kcal:  1650-1850  Protein:  70-85 grams  Fluid:  1.6-1.8 L    Gustavus Bryant, MS, RD, LDN Inpatient Clinical Dietitian Please see [facility] for contact information.

## 2022-01-30 NOTE — Progress Notes (Signed)
RT placed pt in CPAP/PS 10/5. Pt did not tolerate well due to low RR & Ve. RT placed pt back in full ventilatory support in previous settings.

## 2022-01-30 NOTE — Progress Notes (Addendum)
Advanced Heart Failure Rounding Note  PCP-Cardiologist: None   Subjective:   11/6 milrinone stopped. Switched to dobutamine.   Intubated/sedated  DBA 7.5 mcg + vaso 0.04, and Norepi 22 mcg.   CO-OX 59%  Lactic acid >9 >8.1   Objective:   Weight Range: 46 kg Body mass index is 17.41 kg/m.   Vital Signs:   Temp:  [96.6 F (35.9 C)-98.2 F (36.8 C)] 97.5 F (36.4 C) (11/07 0352) Pulse Rate:  [85-135] 122 (11/07 0747) Resp:  [0-30] 30 (11/07 0747) BP: (87-169)/(42-114) 119/96 (11/07 0600) SpO2:  [62 %-100 %] 100 % (11/07 0747) FiO2 (%):  [40 %] 40 % (11/07 0747) Weight:  [46 kg] 46 kg (11/07 0500) Last BM Date : 01/29/22  Weight change: Filed Weights   01/28/22 2220 01/29/22 0436 01/30/22 0500  Weight: 40.8 kg 42.2 kg 46 kg    Intake/Output:   Intake/Output Summary (Last 24 hours) at 01/30/2022 0825 Last data filed at 01/30/2022 0700 Gross per 24 hour  Intake 4629.69 ml  Output 830 ml  Net 3799.69 ml      Physical Exam    General:  Intubated/sedated  HEENT: ETT Neck: Supple. JVP flat . Carotids 2+ bilat; no bruits. No lymphadenopathy or thyromegaly appreciated. Cor: PMI nondisplaced. Regular rate & rhythm. No rubs, gallops or murmurs. Lungs: Clear Abdomen: Soft, nontender, nondistended. No hepatosplenomegaly. No bruits or masses. Good bowel sounds. Extremities: No cyanosis, clubbing, rash, edema. RUE PICC Neuro: Sedated/Intubated.   Telemetry  ST 110-130s   EKG    N/A  Labs    CBC Recent Labs    01/29/22 2142 01/29/22 2330 01/30/22 0703  WBC 12.0* 14.0* 13.7*  NEUTROABS 11.2* 13.1*  --   HGB 8.4* 8.2* 7.2*  HCT 23.0* 23.3* 20.6*  MCV 62.5* 62.8* 62.4*  PLT 27* 23*  24* 17*   Basic Metabolic Panel Recent Labs    01/29/22 0537 01/29/22 0921 01/29/22 2330 01/30/22 0703  NA 135   < > 135 138  K 3.1*   < > 3.1* 3.4*  CL 108   < > 94* 91*  CO2 9*   < > 18* 27  GLUCOSE 102*   < > 185* 405*  BUN 49*   < > 52* 51*  CREATININE  3.70*   < > 3.08* 2.92*  CALCIUM 8.5*   < > 8.0* 7.2*  MG 1.3*  --  2.2 2.0  PHOS 2.6  --  2.6  --    < > = values in this interval not displayed.   Liver Function Tests Recent Labs    01/28/22 2234 01/29/22 0537  AST 52* 58*  ALT 27 23  ALKPHOS 159* 65  BILITOT 1.5* 1.5*  PROT 6.9 4.7*  ALBUMIN 3.4* 2.2*   No results for input(s): "LIPASE", "AMYLASE" in the last 72 hours. Cardiac Enzymes Recent Labs    01/29/22 0537  CKTOTAL 886*    BNP: BNP (last 3 results) No results for input(s): "BNP" in the last 8760 hours.  ProBNP (last 3 results) No results for input(s): "PROBNP" in the last 8760 hours.   D-Dimer Recent Labs    01/29/22 2330  DDIMER >20.00*   Hemoglobin A1C No results for input(s): "HGBA1C" in the last 72 hours. Fasting Lipid Panel No results for input(s): "CHOL", "HDL", "LDLCALC", "TRIG", "CHOLHDL", "LDLDIRECT" in the last 72 hours. Thyroid Function Tests Recent Labs    01/29/22 0537  TSH 0.870    Other results:   Imaging  [place] EKG SITE RITE  Result Date: 01/29/2022 If Site Rite image not attached, placement could not be confirmed due to current cardiac rhythm.  ECHOCARDIOGRAM COMPLETE  Result Date: 01/29/2022    ECHOCARDIOGRAM REPORT   Patient Name:   DEBERAH ADOLF Date of Exam: 01/29/2022 Medical Rec #:  [id number]        Height:       64.0 in Accession #:    [id number]       Weight:       93.0 lb Date of Birth:  03/23/1961        BSA:          1.413 m Patient Age:    51 years         BP:           124/114 mmHg Patient Gender: F                HR:           100 bpm. Exam Location:  Inpatient Procedure: 2D Echo, Color Doppler and Cardiac Doppler Indications:    I42.9 Cardiomyopathy (unspecified)  History:        Patient has prior history of Echocardiogram examinations, most                 recent 04/06/2021. COPD; Risk Factors:Hypertension.  Sonographer:    Raquel Sarna Senior RDCS Referring Phys: Rigoberto Noel  Sonographer Comments: Scanned supine on  artificial respirator. IMPRESSIONS  1. Left ventricular ejection fraction, by estimation, is 20 to 25%. The left ventricle has severely decreased function. The left ventricle demonstrates regional wall motion abnormalities in unsusual pattern with severe diffuse hypokinesis but relative preservation of the LV apical function (?reverse Takotsubo). Left ventricular diastolic parameters are consistent with Grade II diastolic dysfunction (pseudonormalization).  2. Right ventricular systolic function is normal. The right ventricular size is normal. There is mildly elevated pulmonary artery systolic pressure. The estimated right ventricular systolic pressure is 61.9 mmHg.  3. Left atrial size was mildly dilated.  4. The mitral valve is normal in structure. Mild mitral valve regurgitation. No evidence of mitral stenosis.  5. The aortic valve is tricuspid. Aortic valve regurgitation is not visualized. No aortic stenosis is present.  6. The inferior vena cava is dilated in size with <50% respiratory variability, suggesting right atrial pressure of 15 mmHg.  7. A small pericardial effusion is present. There is no evidence of cardiac tamponade. FINDINGS  Left Ventricle: Left ventricular ejection fraction, by estimation, is 20 to 25%. The left ventricle has severely decreased function. The left ventricle demonstrates regional wall motion abnormalities. The left ventricular internal cavity size was normal  in size. There is no left ventricular hypertrophy. Left ventricular diastolic parameters are consistent with Grade II diastolic dysfunction (pseudonormalization). Right Ventricle: The right ventricular size is normal. No increase in right ventricular wall thickness. Right ventricular systolic function is normal. There is mildly elevated pulmonary artery systolic pressure. The tricuspid regurgitant velocity is 2.67  m/s, and with an assumed right atrial pressure of 15 mmHg, the estimated right ventricular systolic pressure is  50.9 mmHg. Left Atrium: Left atrial size was mildly dilated. Right Atrium: Right atrial size was normal in size. Pericardium: A small pericardial effusion is present. There is no evidence of cardiac tamponade. Mitral Valve: The mitral valve is normal in structure. Mild mitral valve regurgitation. No evidence of mitral valve stenosis. Tricuspid Valve: The tricuspid valve is normal in structure. Tricuspid valve regurgitation is mild.  Aortic Valve: The aortic valve is tricuspid. Aortic valve regurgitation is not visualized. No aortic stenosis is present. Pulmonic Valve: The pulmonic valve was normal in structure. Pulmonic valve regurgitation is trivial. Aorta: The aortic root is normal in size and structure. Venous: The inferior vena cava is dilated in size with less than 50% respiratory variability, suggesting right atrial pressure of 15 mmHg. IAS/Shunts: No atrial level shunt detected by color flow Doppler.  LEFT VENTRICLE PLAX 2D LVIDd:         3.20 cm     Diastology LVIDs:         2.80 cm     LV e' medial:    7.72 cm/s LV PW:         1.10 cm     LV E/e' medial:  9.8 LV IVS:        0.80 cm     LV e' lateral:   8.16 cm/s LVOT diam:     1.80 cm     LV E/e' lateral: 9.3 LV SV:         26 LV SV Index:   18 LVOT Area:     2.54 cm  LV Volumes (MOD) LV vol d, MOD A2C: 75.1 ml LV vol d, MOD A4C: 65.7 ml LV vol s, MOD A2C: 57.1 ml LV vol s, MOD A4C: 47.8 ml LV SV MOD A2C:     18.0 ml LV SV MOD A4C:     65.7 ml LV SV MOD BP:      19.0 ml RIGHT VENTRICLE RV S prime:     8.81 cm/s TAPSE (M-mode): 1.2 cm LEFT ATRIUM             Index        RIGHT ATRIUM          Index LA diam:        3.70 cm 2.62 cm/m   RA Area:     6.82 cm LA Vol (A2C):   53.8 ml 38.07 ml/m  RA Volume:   10.90 ml 7.71 ml/m LA Vol (A4C):   43.8 ml 30.99 ml/m LA Biplane Vol: 50.1 ml 35.45 ml/m  AORTIC VALVE LVOT Vmax:   71.80 cm/s LVOT Vmean:  48.300 cm/s LVOT VTI:    0.101 m  AORTA Ao Root diam: 2.80 cm Ao Asc diam:  2.90 cm MITRAL VALVE                TRICUSPID VALVE MV Area (PHT): 6.32 cm    TR Peak grad:   28.5 mmHg MV Decel Time: 120 msec    TR Vmax:        267.00 cm/s MV E velocity: 75.50 cm/s MV A velocity: 50.10 cm/s  SHUNTS MV E/A ratio:  1.51        Systemic VTI:  0.10 m                            Systemic Diam: 1.80 cm Briseida Gittings McleanMD Electronically signed by Franki Monte Signature Date/Time: 01/29/2022/3:09:26 PM    Final    DG CHEST PORT 1 VIEW  Result Date: 01/29/2022 CLINICAL DATA:  Orogastric placement EXAM: PORTABLE CHEST 1 VIEW COMPARISON:  Earlier same day FINDINGS: Endotracheal tube tip 1 cm above the carina. Orogastric tube enters the stomach and doubles back with the tip re-entering the distal esophagus. Right internal jugular central line tip in the proximal right atrium. Widespread pulmonary infiltrates/edema as seen  previously. IMPRESSION: 1. Orogastric tube enters the stomach and doubles back with the tip re-entering the distal esophagus. 2. Endotracheal tube tip 1 cm above the carina. 3. New central line tip in the proximal right atrium. No pneumothorax. 4. Widespread pulmonary infiltrates/edema persist. Electronically Signed   By: Nelson Chimes M.D.   On: 01/29/2022 09:49     Medications:     Scheduled Medications:  arformoterol  15 mcg Nebulization BID   budesonide (PULMICORT) nebulizer solution  0.25 mg Nebulization BID   Chlorhexidine Gluconate Cloth  6 each Topical Q0600   fentaNYL (SUBLIMAZE) injection  50 mcg Intravenous Once   hydrocortisone sod succinate (SOLU-CORTEF) inj  100 mg Intravenous Q8H   insulin aspart  1-3 Units Subcutaneous Q4H   mouth rinse  15 mL Mouth Rinse Q2H   pantoprazole (PROTONIX) IV  40 mg Intravenous Q12H   sodium chloride flush  10-40 mL Intracatheter Q12H   sodium chloride flush  3 mL Intravenous Q12H    Infusions:  sodium chloride Stopped (01/29/22 1539)   sodium chloride Stopped (01/29/22 0431)   sodium chloride     cefTRIAXone (ROCEPHIN)  IV Stopped (01/29/22 2237)    DOBUTamine 7.5 mcg/kg/min (01/30/22 0700)   fentaNYL infusion INTRAVENOUS 100 mcg/hr (01/30/22 0700)   norepinephrine (LEVOPHED) Adult infusion 24 mcg/min (01/30/22 0805)   sodium bicarbonate 150 mEq in dextrose 5 % 1,150 mL infusion 75 mL/hr at 01/30/22 0700   vasopressin 0.04 Units/min (01/30/22 0700)    PRN Medications: Place/Maintain arterial line **AND** sodium chloride, sodium chloride, fentaNYL, ipratropium-albuterol, midazolam, mouth rinse, sodium chloride flush, sodium chloride flush    Patient Profile  61 y/o AAF w/ h/o HTN, COPD, anemia, h/o poor med compliance and prior admission to [facility] 9/23 for UTI and pyelonephritis, admitted w/ mixed septic and cardiogenic shock.    Assessment/Plan  1. Shock: Suspect mixed septic shock and cardiogenic shock.  Lactate was > 9 with PCT > 150.  E coli bacteremia with echo showing EF 20-25%.   - Broad spectrum abx.  - Support MAP with pressors, currently on NE 22+ vasopressin 0.04+ DBA 7.5 mcg.   -  Continue HCO3 gtt per CCM.  2. Acute systolic CHF: Echo showed EF 20-25% with preservation of apical function but severe hypokinesis of other walls, normal RV, dilated IVC. Unusual wall motion abnormality pattern, prior echo was normal.  ?Septic cardiomyopathy with reverse Takotsubo-type pattern.  HS-TnI was minimally elevated with no trend so doubt ACS.  - Support MAP with pressors as above. As above on vaso + norepi+ dobutamine.  -  IVC dilated, she is fluid replete.  Would not push fluid boluses.  Eventually will need diuresis.  - Would plan for LHC/RHC when stabilized and creatinine improved to rule out CAD.  3. AKI: In setting of septic shock.  Creatinine 4.13 => 3.5>2.9  some improvement with maintenance of MAP.   - Maintain MAP and CO.  - Follow BMET closely, has HD catheter but does not need at this time.  4. ID: E coli bacteremia.  PCT > 150, WBCs 13.7 CXR with bilateral infiltrates.  Had recent admission with pyelonephritis.  - On  ceftriaxone.  5. Anemia: Hgb 7.2 , not overtly bleeding. getting 1 unit.  6. Thrombocytopenia: plts 17 K, suspect due to inflammation/sepsis.  7. Acute hypoxemic respiratory failure: PNA on CXR, also suspect pulmonary edema. Intubated.  - Per CCM.    Length of Stay: 1  Amy Clegg, NP  01/30/2022, 8:25 AM  Advanced  Heart Failure Team Pager [phone] (M-F; 7a - 5p)  Please contact [facility] for night-coverage after hours (5p -7a ) and weekends on [email or web address]  Patient seen with NP, agree with the above note.   Co-ox up to 59% but lactate still 8.1 (slow fall).  Creatinine lower 3.5 => 2.92.  CVP 8-9 on my read.  She remains on dobutamine 7.5, NE 24, vasopressin 0.04.    E coli bacteremia on ceftriaxone.   Plts down 23 => 17K, to get 1 unit platelets.  Hgb 7.2.   General: Intubated/sedated Neck: JVP 8 cm, no thyromegaly or thyroid nodule.  Lungs: Clear to auscultation bilaterally with normal respiratory effort. CV: Nondisplaced PMI.  Heart tachy, regular S1/S2, no S3/S4, no murmur.  No peripheral edema.  Abdomen: Soft, no hepatosplenomegaly, no distention.  Skin: Intact without lesions or rashes.  Neurologic: Sedated Extremities: No clubbing or cyanosis.  HEENT: Normal.   Mixed septic/cardiogenic shock.  Suspect primary problem is severe sepsis from E coli bacteremia.  Cardiomyopathy may be stress cardiomyopathy due to severe medical illness, doubt ACS (HS-TnI mildly elevated with no trend).  Co-ox better at 59% though lactate slow to clear. Creatinine is now trending down, 2.92 today.  - Continue dobutamine 7.5 - Wean pressors as able.  - CVP 8-9, would not diurese yet with septic shock.  - Should eventually have cath if she recovers.   On ceftriaxone for E coli bacteremia.   Worsening anemia and thrombocytopenia, suspect this is due to sepsis/inflammation.  She is getting 1 unit plts and 1 unit PRBCs.   Discussed with Dr. Elsworth Soho.   CRITICAL CARE Performed by: Loralie Champagne  Total critical care time: 35 minutes  Critical care time was exclusive of separately billable procedures and treating other patients.  Critical care was necessary to treat or prevent imminent or life-threatening deterioration.  Critical care was time spent personally by me on the following activities: development of treatment plan with patient and/or surrogate as well as nursing, discussions with consultants, evaluation of patient's response to treatment, examination of patient, obtaining history from patient or surrogate, ordering and performing treatments and interventions, ordering and review of laboratory studies, ordering and review of radiographic studies, pulse oximetry and re-evaluation of patient's condition.

## 2022-01-30 NOTE — Progress Notes (Signed)
RT gave critical ABG results to CCM. CCM gave verbal order to decrease pt's RR to 20. RT changed order to match settings.

## 2022-01-30 NOTE — Progress Notes (Signed)
NAME:  Nicole Cunningham, MRN:  [id number], DOB:  04-03-1960, LOS: 1 ADMISSION DATE:  01/28/2022, CONSULTATION DATE:  11/6 REFERRING MD:  Dr. Sedonia Small, CHIEF COMPLAINT:  septic shock   History of Present Illness:  Patient is a 61 year old female with pertinent PMH of COPD, anemia, HTN presents to [facility] ED on 11/6 with AMS.  Patient recently admitted to [facility] with AMS on 11/2021.  Found to have UTI and pyelonephritis.  On 11/5, patient having increased confusion.  Having some SOB over the past couple of days.  Brought to [facility] ED for further eval.  Upon arrival to [facility] ED, patient confused but able to state name.  Admitted with shock, AKI, lactic acidosis of 9.0  Pertinent ED labs: CO2 14, BUN 51, creat 4.13, AST 52, alk phos 159, AG 23, WBC 1.3, Hgb 9.3, platelets 70, troponin 64 then 80.  CT head without acute abnormality.  CXR no significant findings.  CT abdomen pelvis 17 mm nonobstructive renal calculus without hydronephrosis.  Found to have E. coli bacteremia and EF 20 to 25% on echo, mixed cardiogenic and septic shock  Pertinent  Medical History   Past Medical History:  Diagnosis Date   Bronchitis    COPD (chronic obstructive pulmonary disease) ([place])    Hypertension      Significant Hospital Events: Including procedures, antibiotic start and stop dates in addition to other pertinent events   11/6 septic shock on levo , ETT >> 11/6 Echo EF 20 to 25% global hypokinesis, apex intact 11/6 RIJ HD cath >>  Interim History / Subjective:   Critically ill, intubated. Remains on high-dose Levophed and vasopressin Urine output improved 500 cc over the last 12 hours Dobutamine increased to 7.5 mics Sedated on low-dose fentanyl  Objective   Blood pressure (!) 119/96, pulse (!) 122, temperature (!) 97.5 F (36.4 C), temperature source Axillary, resp. rate (!) 30, height _0  (1.626 m), weight 46 kg, SpO2 100 %. CVP:  [10 mmHg-13 mmHg] 10 mmHg  Vent Mode: PRVC FiO2 (%):  [40 %] 40 % Set  Rate:  [30 bmp] 30 bmp Vt Set:  [430 mL] 430 mL PEEP:  [5 cmH20] 5 cmH20 Plateau Pressure:  [18 cmH20-22 cmH20] 20 cmH20   Intake/Output Summary (Last 24 hours) at 01/30/2022 0846 Last data filed at 01/30/2022 0700 Gross per 24 hour  Intake 4629.69 ml  Output 830 ml  Net 3799.69 ml    Filed Weights   01/28/22 2220 01/29/22 0436 01/30/22 0500  Weight: 40.8 kg 42.2 kg 46 kg    Examination: General: Thin cachectic ill appearing female, no distress HEENT: MM pink/dry Neuro: RASS is -2 on fentanyl drip, CV: s1s2, tachycardia , no m/r/g PULM: Scattered crackles on right, no rhonchi, no accessory muscle use GI: soft, bsx4 active  Extremities: warm/dry, thin cachectic Skin: no rashes or lesions appreciated  Chest x-ray independently reviewed shows bilateral upper lobe infiltrates, NG tube coiling backup, ET tube in position.  Labs show stable BUN slight drop in creatinine, mild hypokalemia, mild leukocytosis, severe thrombocytopenia  Resolved Hospital Problem list     Assessment & Plan:  Mixed cardiogenic and septic shock: E. coli likely urinary source Persistent lactic acidosis Cardiomyopathy?  Of sepsis P: -Admit to ICU with continuous telemetry -Continue ceftriaxone, no resistance on BC ID -Trend  lactate -Titrate dobutamine per Co. ox, currently 59% 5 mics  Acute encephalopathy: Likely sepsis related -CT head without acute abnormality -UDS, ethanol, salicylate/acetaminophen negative P: -Fentanyl drip with goal RASS  0 to -1  Acute respiratory failure with hypoxia COPD: On Breo Ellipta Multifocal pneumonia, new bilateral upper lobe infiltrates?  Fluid versus aspiration P: --Pulmicort and Brovana -As needed DuoNeb for wheezing -Hold off spontaneous breathing trials until pressor requirements decrease  AKI , improving AGMA-LA P: -dc  bicarb -Trend BMP / urinary output -Replace electrolytes as indicated -Avoid nephrotoxic agents, ensure adequate renal  perfusion  Pancytopenia: 9/15 wbc 9.6 now 1.3 Severe thrombocytopenia. Hx of iron def anemia: Requiring frequent transfusions P: -Goal hemoglobin 7 and above, goal platelets 20 K and above with bleeding -Transfuse 1 unit platelets -trend cbc -Consider heme/onc consult  Acute blood loss anemia/upper GI bleed Mildly elevated LFTs -CT abdomen pelvis negative for acute findings P: -PPI twice daily  QTc prolongation P: -Avoid QTc prolonging agents -Check mag -Telemetry monitoring  Renal calculus: 17 mm nonobstructing renal calculus without hydronephrosis P: -Will need urology consult at some point  Hx of anxiety/depression P: -Hold home anxiety meds given QTc 520  Best Practice (right click and "Reselect all SmartList Selections" daily)   Diet/type: NPO start tube feeds after adjusting OG tube which was calling back on itself DVT prophylaxis: prophylactic heparin  GI prophylaxis: N/A Lines: N/A Foley:  N/A Code Status:  full code Last date of multidisciplinary goals of care discussion [11/6 updated  son Fritz Pickerel and spouse Fritz Pickerel over phone ]  Labs   CBC: Recent Labs  Lab 01/28/22 2234 01/29/22 0841 01/29/22 0921 01/29/22 1149 01/29/22 1513 01/29/22 2142 01/29/22 2330 01/30/22 0703  WBC 1.3* 18.3*  --   --   --  12.0* 14.0* 13.7*  NEUTROABS 0.8* 15.2*  --   --   --  11.2* 13.1*  --   HGB 9.3* 6.5*   < > 6.5* 6.5* 8.4* 8.2* 7.2*  HCT 28.6* 19.0*   < > 19.0* 19.0* 23.0* 23.3* 20.6*  MCV 63.7* 61.5*  --   --   --  62.5* 62.8* 62.4*  PLT 70* 60*  --   --   --  27* 23*  24* 17*   < > = values in this interval not displayed.     Basic Metabolic Panel: Recent Labs  Lab 01/28/22 2234 01/29/22 0537 01/29/22 0921 01/29/22 1149 01/29/22 1238 01/29/22 1513 01/29/22 2330 01/30/22 0703  NA 139 135   < > 135 138 137 135 138  K 3.6 3.1*   < > 3.7 4.1 3.4* 3.1* 3.4*  CL 102 108  --   --  99  --  94* 91*  CO2 14* 9*  --   --  12*  --  18* 27  GLUCOSE 90 102*  --    --  153*  --  185* 405*  BUN 51* 49*  --   --  52*  --  52* 51*  CREATININE 4.13* 3.70*  --   --  3.53*  --  3.08* 2.92*  CALCIUM 10.2 8.5*  --   --  8.5*  --  8.0* 7.2*  MG  --  1.3*  --   --   --   --  2.2 2.0  PHOS  --  2.6  --   --   --   --  2.6  --    < > = values in this interval not displayed.    GFR: Estimated Creatinine Clearance: 14.3 mL/min (A) (by C-G formula based on SCr of 2.92 mg/dL (H)). Recent Labs  Lab 01/29/22 0537 01/29/22 0841 01/29/22 1226 01/29/22  2142 01/29/22 2322 01/29/22 2330 01/30/22 0703 01/30/22 0704  PROCALCITON >150.00  --   --   --   --   --   --   --   WBC  --  18.3*  --  12.0*  --  14.0* 13.7*  --   LATICACIDVEN 8.4*  --  >9.0*  --  7.8*  --   --  8.1*     Liver Function Tests: Recent Labs  Lab 01/28/22 2234 01/29/22 0537  AST 52* 58*  ALT 27 23  ALKPHOS 159* 65  BILITOT 1.5* 1.5*  PROT 6.9 4.7*  ALBUMIN 3.4* 2.2*    No results for input(s): "LIPASE", "AMYLASE" in the last 168 hours. Recent Labs  Lab 01/29/22 0537  AMMONIA 36*    ABG    Component Value Date/Time   PHART 7.387 01/29/2022 1513   PCO2ART 22.1 (L) 01/29/2022 1513   PO2ART 128 (H) 01/29/2022 1513   HCO3 13.3 (L) 01/29/2022 1513   TCO2 14 (L) 01/29/2022 1513   ACIDBASEDEF 11.0 (H) 01/29/2022 1513   O2SAT 59.4 01/30/2022 0511     Coagulation Profile: Recent Labs  Lab 01/28/22 2234 01/29/22 2330  INR 1.8* 2.2*     Cardiac Enzymes: Recent Labs  Lab 01/29/22 0537  CKTOTAL 886*    HbA1C: Hgb A1c MFr Bld  Date/Time Value Ref Range Status  04/18/2020 05:13 PM 5.3 4.8 - 5.6 % Final    Comment:    (NOTE) Pre diabetes:          5.7%-6.4%  Diabetes:              >6.4%  Glycemic control for   <7.0% adults with diabetes     CBG: Recent Labs  Lab 01/29/22 1940 01/30/22 0131 01/30/22 0352 01/30/22 0747 01/30/22 0756  GLUCAP 166* 178* 195* <10* 177*    Critical care time: 45 minutes      Kara Mead MD. FCCP. [facility] Pager : [phone]  If no response to pager , please call [phone] until 7 pm After 7:00 pm call Elink  [phone]   ' 01/30/2022, 8:46 AM

## 2022-01-30 NOTE — Progress Notes (Signed)
[facility] Progress Note Patient Name: Nicole Cunningham DOB: November 26, 1960 MRN: [id number]   Date of Service  01/30/2022  HPI/Events of Note  K+ 3.1, Lactic acid slightly improved at 7.8, echocardiogram consistent with  strong cardiogenic component to the shock  (along with likely sepsis), urine output is approximately 82 lm / hour over the course of this shift (which is decent), platelets now 23 K but no schistocytes on peripheral smear, and no indication of active bleeding. GFR 16.  eICU Interventions  KCL 10 meq iv Q 1 hour x 3, defer platelet transfusion for now, continue to trend lactic acid since it is dropping, addition of low dose epinephrine or Dobutamine gtt may be a consideration if lactic acid plateaus or starts rising.        Adellyn Capek U Zamari Bonsall 01/30/2022, 1:51 AM

## 2022-01-30 NOTE — Progress Notes (Signed)
[facility] Progress Note Patient Name: Nicole Cunningham DOB: 1961/03/11 MRN: [id number]   Date of Service  01/30/2022  HPI/Events of Note  Blood sugar 178 - 195 mg / dl.  eICU Interventions  CBG Q 4 hours with SSI coverage ordered.        [name] Franz Svec 01/30/2022, 4:28 AM

## 2022-01-31 ENCOUNTER — Inpatient Hospital Stay (HOSPITAL_COMMUNITY): Payer: Self-pay

## 2022-01-31 LAB — TYPE AND SCREEN
ABO/RH(D): AB POS
Antibody Screen: NEGATIVE
Unit division: 0
Unit division: 0

## 2022-01-31 LAB — CULTURE, BLOOD (ROUTINE X 2)
Special Requests: ADEQUATE
Special Requests: ADEQUATE

## 2022-01-31 LAB — CBC WITH DIFFERENTIAL/PLATELET
Abs Granulocyte: 15 10*3/uL — ABNORMAL HIGH (ref 1.5–6.5)
Abs Granulocyte: 15.9 10*3/uL — ABNORMAL HIGH (ref 1.5–6.5)
Abs Immature Granulocytes: 3.09 10*3/uL — ABNORMAL HIGH (ref 0.00–0.07)
Abs Immature Granulocytes: 3.69 10*3/uL — ABNORMAL HIGH (ref 0.00–0.07)
Basophils Absolute: 0.2 10*3/uL — ABNORMAL HIGH (ref 0.0–0.1)
Basophils Absolute: 0.1 10*3/uL (ref 0.0–0.1)
Basophils Relative: 1 %
Basophils Relative: 1 %
Eosinophils Absolute: 0 10*3/uL (ref 0.0–0.5)
Eosinophils Absolute: 0 10*3/uL (ref 0.0–0.5)
Eosinophils Relative: 0 %
Eosinophils Relative: 0 %
HCT: 26.9 % — ABNORMAL LOW (ref 36.0–46.0)
HCT: 28.2 % — ABNORMAL LOW (ref 36.0–46.0)
Hemoglobin: 9.8 g/dL — ABNORMAL LOW (ref 12.0–15.0)
Hemoglobin: 10.1 g/dL — ABNORMAL LOW (ref 12.0–15.0)
Immature Granulocytes: 0 %
Immature Granulocytes: 0 %
Lymphocytes Relative: 4 %
Lymphocytes Relative: 5 %
Lymphs Abs: 0.8 10*3/uL (ref 0.7–4.0)
Lymphs Abs: 1 10*3/uL (ref 0.7–4.0)
MCH: 24.1 pg — ABNORMAL LOW (ref 26.0–34.0)
MCH: 23.7 pg — ABNORMAL LOW (ref 26.0–34.0)
MCHC: 36.4 g/dL — ABNORMAL HIGH (ref 30.0–36.0)
MCHC: 35.8 g/dL (ref 30.0–36.0)
MCV: 66.1 fL — ABNORMAL LOW (ref 80.0–100.0)
MCV: 66.2 fL — ABNORMAL LOW (ref 80.0–100.0)
Monocytes Absolute: 0.4 10*3/uL (ref 0.1–1.0)
Monocytes Absolute: 0.7 10*3/uL (ref 0.1–1.0)
Monocytes Relative: 2 %
Monocytes Relative: 3 %
Neutro Abs: 15 10*3/uL — ABNORMAL HIGH (ref 1.7–7.7)
Neutro Abs: 15.9 10*3/uL — ABNORMAL HIGH (ref 1.7–7.7)
Neutrophils Relative %: 93 %
Neutrophils Relative %: 91 %
Platelets: 40 10*3/uL — ABNORMAL LOW (ref 150–400)
Platelets: 22 10*3/uL — CL (ref 150–400)
RBC: 4.07 MIL/uL (ref 3.87–5.11)
RBC: 4.26 MIL/uL (ref 3.87–5.11)
RDW: 26.2 % — ABNORMAL HIGH (ref 11.5–15.5)
RDW: 25.9 % — ABNORMAL HIGH (ref 11.5–15.5)
WBC: 19.5 10*3/uL — ABNORMAL HIGH (ref 4.0–10.5)
WBC: 21.4 10*3/uL — ABNORMAL HIGH (ref 4.0–10.5)
nRBC: 0.3 % — ABNORMAL HIGH (ref 0.0–0.2)
nRBC: 0.2 % (ref 0.0–0.2)

## 2022-01-31 LAB — BPAM RBC
Blood Product Expiration Date: 202311082359
Blood Product Expiration Date: 202311212359
ISSUE DATE / TIME: 202311061526
ISSUE DATE / TIME: 202311071125
Unit Type and Rh: 1700
Unit Type and Rh: 8400

## 2022-01-31 LAB — BASIC METABOLIC PANEL
Anion gap: 20 — ABNORMAL HIGH (ref 5–15)
BUN: 60 mg/dL — ABNORMAL HIGH (ref 8–23)
CO2: 21 mmol/L — ABNORMAL LOW (ref 22–32)
Calcium: 7.3 mg/dL — ABNORMAL LOW (ref 8.9–10.3)
Chloride: 99 mmol/L (ref 98–111)
Creatinine, Ser: 2.68 mg/dL — ABNORMAL HIGH (ref 0.44–1.00)
GFR, Estimated: 19 mL/min — ABNORMAL LOW (ref >60)
Glucose, Bld: 120 mg/dL — ABNORMAL HIGH (ref 70–99)
Potassium: 3.8 mmol/L (ref 3.5–5.1)
Sodium: 140 mmol/L (ref 135–145)

## 2022-01-31 LAB — URINE CULTURE: Culture: 60000 — AB

## 2022-01-31 LAB — POCT I-STAT 7, (LYTES, BLD GAS, ICA,H+H)
Acid-base deficit: 4 mmol/L — ABNORMAL HIGH (ref 0.0–2.0)
Bicarbonate: 19.3 mmol/L — ABNORMAL LOW (ref 20.0–28.0)
Calcium, Ion: 1.11 mmol/L — ABNORMAL LOW (ref 1.15–1.40)
HCT: 29 % — ABNORMAL LOW (ref 36.0–46.0)
Hemoglobin: 9.9 g/dL — ABNORMAL LOW (ref 12.0–15.0)
O2 Saturation: 88 %
Patient temperature: 98.4
Potassium: 4 mmol/L (ref 3.5–5.1)
Sodium: 138 mmol/L (ref 135–145)
TCO2: 20 mmol/L — ABNORMAL LOW (ref 22–32)
pCO2 arterial: 28.2 mmHg — ABNORMAL LOW (ref 32–48)
pH, Arterial: 7.444 (ref 7.35–7.45)
pO2, Arterial: 52 mmHg — ABNORMAL LOW (ref 83–108)

## 2022-01-31 LAB — COOXEMETRY PANEL
Carboxyhemoglobin: 0.6 % (ref 0.5–1.5)
Carboxyhemoglobin: 0.4 % — ABNORMAL LOW (ref 0.5–1.5)
Carboxyhemoglobin: 1.8 % — ABNORMAL HIGH (ref 0.5–1.5)
Methemoglobin: <0.7 % (ref 0.0–1.5)
Methemoglobin: 0.7 % (ref 0.0–1.5)
Methemoglobin: <0.7 % (ref 0.0–1.5)
O2 Saturation: 29.1 %
O2 Saturation: 35.9 %
O2 Saturation: 62.5 %
Total hemoglobin: 10.3 g/dL — ABNORMAL LOW (ref 12.0–16.0)
Total hemoglobin: 10.6 g/dL — ABNORMAL LOW (ref 12.0–16.0)
Total hemoglobin: 10.5 g/dL — ABNORMAL LOW (ref 12.0–16.0)

## 2022-01-31 LAB — LACTIC ACID, PLASMA: Lactic Acid, Venous: 7.4 mmol/L (ref 0.5–1.9)

## 2022-01-31 LAB — GLUCOSE, CAPILLARY
Glucose-Capillary: 114 mg/dL — ABNORMAL HIGH (ref 70–99)
Glucose-Capillary: 116 mg/dL — ABNORMAL HIGH (ref 70–99)
Glucose-Capillary: 170 mg/dL — ABNORMAL HIGH (ref 70–99)
Glucose-Capillary: 174 mg/dL — ABNORMAL HIGH (ref 70–99)
Glucose-Capillary: 101 mg/dL — ABNORMAL HIGH (ref 70–99)
Glucose-Capillary: <10 mg/dL — CL (ref 70–99)
Glucose-Capillary: 233 mg/dL — ABNORMAL HIGH (ref 70–99)

## 2022-01-31 LAB — PHOSPHORUS: Phosphorus: 3.8 mg/dL (ref 2.5–4.6)

## 2022-01-31 LAB — PREPARE PLATELET PHERESIS: Unit division: 0

## 2022-01-31 LAB — BPAM PLATELET PHERESIS
Blood Product Expiration Date: 202311072359
ISSUE DATE / TIME: 202311071122
Unit Type and Rh: 6200

## 2022-01-31 LAB — HEPARIN INDUCED PLATELET AB (HIT ANTIBODY): Heparin Induced Plt Ab: 0.066 OD (ref 0.000–0.400)

## 2022-01-31 LAB — MAGNESIUM: Magnesium: 2.3 mg/dL (ref 1.7–2.4)

## 2022-01-31 MED ORDER — VITAL 1.5 CAL PO LIQD
1000.0000 mL | ORAL | Status: DC
  Administered 2022-02-01: 1000 mL
  Filled 2022-01-31: qty 1000

## 2022-01-31 MED ORDER — INSULIN ASPART 100 UNIT/ML IJ SOLN
0.0000 [IU] | INTRAMUSCULAR | Status: DC
  Administered 2022-01-31: 2 [IU] via SUBCUTANEOUS
  Administered 2022-02-01: 3 [IU] via SUBCUTANEOUS

## 2022-01-31 MED ORDER — DOBUTAMINE IN D5W 4-5 MG/ML-% IV SOLN
1.0000 ug/kg/min | INTRAVENOUS | Status: DC
  Administered 2022-01-31: 10 ug/kg/min via INTRAVENOUS
  Administered 2022-02-02: 5 ug/kg/min via INTRAVENOUS
  Administered 2022-02-07: 2 ug/kg/min via INTRAVENOUS
  Filled 2022-01-31 (×4): qty 250

## 2022-01-31 MED ORDER — SODIUM ZIRCONIUM CYCLOSILICATE 5 G PO PACK
5.0000 g | PACK | Freq: Once | ORAL | Status: DC
  Filled 2022-01-31: qty 1

## 2022-01-31 MED ORDER — CALCIUM GLUCONATE-NACL 2-0.675 GM/100ML-% IV SOLN
2.0000 g | Freq: Once | INTRAVENOUS | Status: AC
  Administered 2022-01-31: 2000 mg via INTRAVENOUS
  Filled 2022-01-31: qty 100

## 2022-01-31 NOTE — Procedures (Signed)
Cortrak  Person Inserting Tube:  Nicole Cunningham, RD Tube Type:  Cortrak - 43 inches Tube Size:  10 Tube Location:  Left nare Secured by: Bridle Technique Used to Measure Tube Placement:  Marking at nare/corner of mouth Cortrak Secured At:  68 cm   Cortrak Tube Team Note:  Consult received to place a Cortrak feeding tube.   X-ray is required, abdominal x-ray has been ordered by the Cortrak team. Please confirm tube placement before using the Cortrak tube.   If the tube becomes dislodged please keep the tube and contact the Cortrak team at [email or web address] for replacement.  If after hours and replacement cannot be delayed, place a NG tube and confirm placement with an abdominal x-ray.    Nicole Cunningham RD, LDN Clinical Dietitian See AMiON for contact information.    

## 2022-01-31 NOTE — Progress Notes (Signed)
NAME:  Nicole Cunningham, MRN:  [id number], DOB:  [birth year], LOS: 2 ADMISSION DATE:  01/28/2022, CONSULTATION DATE:  11/6 REFERRING MD:  Dr. Sedonia Small, CHIEF COMPLAINT:  septic shock   History of Present Illness:  Patient is a 61 year old female with pertinent PMH of COPD, anemia, HTN presents to [facility] ED on 11/6 with AMS.  Patient recently admitted to [facility] with AMS on 11/2021.  Found to have UTI and pyelonephritis.  On 11/5, patient having increased confusion.  Having some SOB over the past couple of days.  Brought to [facility] ED for further eval.  Upon arrival to [facility] ED, patient confused but able to state name.  Admitted with shock, AKI, lactic acidosis of 9.0  Pertinent ED labs: CO2 14, BUN 51, creat 4.13, AST 52, alk phos 159, AG 23, WBC 1.3, Hgb 9.3, platelets 70, troponin 64 then 80.  CT head without acute abnormality.  CXR no significant findings.  CT abdomen pelvis 17 mm nonobstructive renal calculus without hydronephrosis.  Found to have E. coli bacteremia and EF 20 to 25% on echo, mixed cardiogenic and septic shock  Pertinent  Medical History   Past Medical History:  Diagnosis Date   Bronchitis    COPD (chronic obstructive pulmonary disease) ([place])    Hypertension      Significant Hospital Events: Including procedures, antibiotic start and stop dates in addition to other pertinent events   11/6 septic shock on levo , ETT >> 11/6 Echo EF 20 to 25% global hypokinesis, apex intact 11/6 RIJ HD cath >> dobutamine at 5 mics 11/7>>Dobutamine increased to 7.5 mics , 1 unit PRBC and 1 unit platelet transfusion  Interim History / Subjective:   Critically ill, intubated. Remains on 25 mics of Levophed and 7.5 dobutamine Urine output low Afebrile  Objective   Blood pressure (!) 112/92, pulse (!) 118, temperature 98.4 F (36.9 C), temperature source Oral, resp. rate 18, height _0  (1.626 m), weight 47.6 kg, SpO2 91 %. CVP:  [8 mmHg-13 mmHg] 11 mmHg  Vent Mode: PRVC FiO2 (%):  [40 %] 40  % Set Rate:  [20 bmp] 20 bmp Vt Set:  [430 mL] 430 mL PEEP:  [5 cmH20] 5 cmH20 Plateau Pressure:  [18 cmH20-20 cmH20] 20 cmH20   Intake/Output Summary (Last 24 hours) at 01/31/2022 0919 Last data filed at 01/31/2022 5053 Gross per 24 hour  Intake 2172.04 ml  Output 380 ml  Net 1792.04 ml    Filed Weights   01/29/22 0436 01/30/22 0500 01/31/22 0125  Weight: 42.2 kg 46 kg 47.6 kg    Examination: General: Thin cachectic ill appearing female, no distress HEENT: MM pink/dry Neuro: RASS is -2 on fentanyl drip, CV: s1s2, tachycardia , no m/r/g PULM: Scattered crackles on right, no rhonchi, no accessory muscle use GI: soft, bsx4 active  Extremities: warm/dry, thin cachectic Skin: no rashes or lesions appreciated  Chest x-ray independently reviewed shows bilateral upper lobe infiltrates, NG tube coiling backup, ET tube in position. X-ray abdomen shows repositioned OG tube    Labs show stable BUN slight drop in creatinine,  leukocytosis, severe thrombocytopenia Co. ox low at 29% Schistocytes and target cells noted but not on pathologist smear review  Resolved Hospital Problem list     Assessment & Plan:  Mixed cardiogenic and septic shock: E. coli likely urinary source Persistent lactic acidosis Cardiomyopathy?  Of sepsis, reverse Takotsubo pattern P: -Admit to ICU with continuous telemetry -Continue ceftriaxone, no resistance on BC ID -Trend  lactate -Titrate  dobutamine per Co. ox, acute drop to 29%, increase dobutamine to 10 mics -Will eventually need LHC/RHC  Acute encephalopathy: Likely sepsis related -CT head without acute abnormality -UDS, ethanol, salicylate/acetaminophen negative P: -Fentanyl drip with goal RASS 0 to -1, can transition to intermittent  Acute respiratory failure with hypoxia COPD: On Breo Ellipta Multifocal pneumonia, new bilateral upper lobe infiltrates?  Fluid versus aspiration P: --Pulmicort and Brovana -As needed DuoNeb for  wheezing -Hold off spontaneous breathing trials until pressor requirements decrease  AKI , improving AGMA-LA P:  -Trend BMP / urinary output -Replace electrolytes as indicated -Avoid nephrotoxic agents, ensure adequate renal perfusion  Pancytopenia: nadir 1.3, likely related to sepsis Severe thrombocytopenia, transfuse 1 unit 11/7 Hx of iron def anemia: Requiring frequent transfusions No schistocytes on pathologist review P: -Goal hemoglobin 8 and above, goal platelets 10 K and above unless bleeding -Transfuse 1 unit platelets -trend cbc -Consider heme/onc consult  Acute blood loss anemia/upper GI bleed Mildly elevated LFTs -CT abdomen pelvis negative for acute findings P: -PPI twice daily  QTc prolongation P: -Avoid QTc prolonging agents -Check mag -Telemetry monitoring  Renal calculus: 17 mm nonobstructing renal calculus without hydronephrosis P: -Will need urology consult at some point  Hx of anxiety/depression P: -Hold home anxiety meds given QTc 520  Best Practice (right click and "Reselect all SmartList Selections" daily)   Diet/type: tubefeeds and NPO  DVT prophylaxis: SCD GI prophylaxis: PPI Lines: Central line, Arterial Line, and yes and it is still needed Foley:  Yes, and it is still needed Code Status:  full code Last date of multidisciplinary goals of care discussion [11/6 updated  son Fritz Pickerel and spouse Fritz Pickerel over phone ]  Labs   CBC: Recent Labs  Lab 01/29/22 0841 01/29/22 0921 01/29/22 2142 01/29/22 2330 01/30/22 0703 01/30/22 0936 01/30/22 1217 01/30/22 1900 01/31/22 0515  WBC 18.3*  --  12.0* 14.0* 13.7*  --   --  19.5* 21.4*  NEUTROABS 15.2*  --  11.2* 13.1*  --   --   --  15.0* 15.9*  HGB 6.5*   < > 8.4* 8.2* 7.2* 7.8* 7.8* 9.8* 10.1*  HCT 19.0*   < > 23.0* 23.3* 20.6* 23.0* 23.0* 26.9* 28.2*  MCV 61.5*  --  62.5* 62.8* 62.4*  --   --  66.1* 66.2*  PLT 60*  --  27* 23*  24* 17*  --   --  40* 22*   < > = values in this interval  not displayed.     Basic Metabolic Panel: Recent Labs  Lab 01/29/22 0537 01/29/22 0921 01/29/22 1238 01/29/22 1513 01/29/22 2330 01/30/22 0703 01/30/22 0936 01/30/22 1217 01/30/22 1900 01/31/22 0515  NA 135   < > 138   < > 135 138 136 136 137 140  K 3.1*   < > 4.1   < > 3.1* 3.4* 3.7 3.7 3.7 3.8  CL 108  --  99  --  94* 91*  --   --  98 99  CO2 9*  --  12*  --  18* 27  --   --  22 21*  GLUCOSE 102*  --  153*  --  185* 405*  --   --  125* 120*  BUN 49*  --  52*  --  52* 51*  --   --  57* 60*  CREATININE 3.70*  --  3.53*  --  3.08* 2.92*  --   --  2.80* 2.68*  CALCIUM 8.5*  --  8.5*  --  8.0* 7.2*  --   --  7.4* 7.3*  MG 1.3*  --   --   --  2.2 2.0  --   --   --  2.3  PHOS 2.6  --   --   --  2.6  --   --   --   --  3.8   < > = values in this interval not displayed.    GFR: Estimated Creatinine Clearance: 16.1 mL/min (A) (by C-G formula based on SCr of 2.68 mg/dL (H)). Recent Labs  Lab 01/29/22 0537 01/29/22 0841 01/29/22 1226 01/29/22 2142 01/29/22 2322 01/29/22 2330 01/30/22 0703 01/30/22 0704 01/30/22 1900 01/31/22 0515  PROCALCITON >150.00  --   --   --   --   --   --   --   --   --   WBC  --    < >  --    < >  --  14.0* 13.7*  --  19.5* 21.4*  LATICACIDVEN 8.4*  --  >9.0*  --  7.8*  --   --  8.1*  --   --    < > = values in this interval not displayed.     Liver Function Tests: Recent Labs  Lab 01/28/22 2234 01/29/22 0537  AST 52* 58*  ALT 27 23  ALKPHOS 159* 65  BILITOT 1.5* 1.5*  PROT 6.9 4.7*  ALBUMIN 3.4* 2.2*    No results for input(s): "LIPASE", "AMYLASE" in the last 168 hours. Recent Labs  Lab 01/29/22 0537  AMMONIA 36*     ABG    Component Value Date/Time   PHART 7.559 (H) 01/30/2022 1217   PCO2ART 28.9 (L) 01/30/2022 1217   PO2ART 83 01/30/2022 1217   HCO3 25.7 01/30/2022 1217   TCO2 27 01/30/2022 1217   ACIDBASEDEF 11.0 (H) 01/29/2022 1513   O2SAT 29.1 01/31/2022 0510     Coagulation Profile: Recent Labs  Lab  01/28/22 2234 01/29/22 2330  INR 1.8* 2.2*     Cardiac Enzymes: Recent Labs  Lab 01/29/22 0537  CKTOTAL 886*     HbA1C: Hgb A1c MFr Bld  Date/Time Value Ref Range Status  04/18/2020 05:13 PM 5.3 4.8 - 5.6 % Final    Comment:    (NOTE) Pre diabetes:          5.7%-6.4%  Diabetes:              >6.4%  Glycemic control for   <7.0% adults with diabetes     CBG: Recent Labs  Lab 01/30/22 1941 01/30/22 1947 01/30/22 2346 01/31/22 0344 01/31/22 0753  GLUCAP 16* 120* 85 114* 116*     Critical care time: 40 minutes      Kara Mead MD. FCCP. [facility] Pager : [phone]  If no response to pager , please call [phone] until 7 pm After 7:00 pm call Elink  [phone]   ' 01/31/2022, 9:19 AM

## 2022-01-31 NOTE — Progress Notes (Signed)
Arterial line re-dressed. Site clean, dry and intact. Line able to draw back and flush without complication.

## 2022-01-31 NOTE — Progress Notes (Signed)
Patient's family brought her medications from home. Was not able to send medications home with family. Sent patient's home medications down to pharmacy.

## 2022-01-31 NOTE — Progress Notes (Addendum)
Inpatient Diabetes Program Recommendations  AACE/ADA: New Consensus Statement on Inpatient Glycemic Control (2015)  Target Ranges:  Prepandial:   less than 140 mg/dL      Peak postprandial:   less than 180 mg/dL (1-2 hours)      Critically ill patients:  140 - 180 mg/dL   Lab Results  Component Value Date   GLUCAP 116 (H) 01/31/2022   HGBA1C 5.3 04/18/2020    Review of Glycemic Control  Latest Reference Range & Units 01/30/22 19:41 01/30/22 19:47 01/30/22 23:46 01/31/22 03:44 01/31/22 07:53  Glucose-Capillary 70 - 99 mg/dL 16 (LL) 438 (H) 85 887 (H) 116 (H)   Current orders for Inpatient glycemic control:  Novolog moderate q 4 hours  Inpatient Diabetes Program Recommendations:    Consider reducing Novolog correction to "very Sensitive" 0-6 units q 4 hours.    Thanks,  Beryl Meager, RN, BC-ADM Inpatient Diabetes Coordinator Pager [phone]  (8a-5p)

## 2022-01-31 NOTE — Progress Notes (Signed)
Brief Nutrition Note  Discussed pt with PCCM. Pt tolerating trickle tube feeds via OG tube without issue. OG tube was repositioned yesterday with abdominal x-ray showing OG tube in the stomach body region. Pt with active Cortrak order in place.  Per PCCM, okay to advance tube feeding regimen to goal. Vital 1.5 tube feeding formula has been substituted for Peptamen 1.5 due to product availability.  RD to adjust tube feeding orders: - Increase Vital 1.5 to 30 ml/hr and continue to advance rate by 10 ml q 6 hours to goal rate of 50 ml/hr (1200 ml/day)  Tube feeding regimen at goal rate provides 1800 kcal, 81 grams of protein, and 917 ml of H2O.    Mertie Clause, MS, RD, LDN Inpatient Clinical Dietitian Please see AMiON for contact information.

## 2022-01-31 NOTE — Progress Notes (Signed)
eLink Physician-Brief Progress Note Patient Name: Nicole Cunningham DOB: 25-Apr-1960 MRN: [id number]   Date of Service  01/31/2022  HPI/Events of Note  K+ 5.5, patient is due for dialysis later today.  eICU Interventions  Lokelma 5 gm po x 1 ordered.        Thomasene Lot Avin Gibbons 01/31/2022, 6:34 AM

## 2022-01-31 NOTE — Progress Notes (Signed)
Patient's husband at bedside, patient's belongings that were at bedside sent home with patient's husband.      RN did not retrieve patient medications from pharmacy so patient medications still in pharmacy.

## 2022-01-31 NOTE — Progress Notes (Signed)
Patient ID: Nicole Cunningham, female   DOB: 15-Jun-1960, 61 y.o.   MRN: [id number]     Advanced Heart Failure Rounding Note  PCP-Cardiologist: None   Subjective:    11/6 milrinone stopped. Switched to dobutamine.   Intubated/sedated  DBA 7.5 mcg + vaso 0.04, and Norepi 20 mcg. Co-ox 29% this morning, ?accuracy (59% yesterday pm). CVP 11, creatinine down to 2.68.    Objective:   Weight Range: 47.6 kg Body mass index is 18.01 kg/m.   Vital Signs:   Temp:  [97.7 F (36.5 C)-99.9 F (37.7 C)] 98.4 F (36.9 C) (11/08 0830) Pulse Rate:  [112-129] 118 (11/08 0835) Resp:  [12-24] 18 (11/08 0835) BP: (49-136)/(30-100) 112/92 (11/08 0835) SpO2:  [66 %-100 %] 91 % (11/08 0835) FiO2 (%):  [40 %] 40 % (11/08 0805) Weight:  [47.6 kg] 47.6 kg (11/08 0125) Last BM Date : 01/30/22  Weight change: Filed Weights   01/29/22 0436 01/30/22 0500 01/31/22 0125  Weight: 42.2 kg 46 kg 47.6 kg    Intake/Output:   Intake/Output Summary (Last 24 hours) at 01/31/2022 0902 Last data filed at 01/31/2022 1761 Gross per 24 hour  Intake 2172.04 ml  Output 380 ml  Net 1792.04 ml      Physical Exam    General: intubated/sedated.  Neck: No JVD, no thyromegaly or thyroid nodule.  Lungs: Decreased at bases.  CV: Nondisplaced PMI.  Heart regular S1/S2, no S3/S4, no murmur.  No peripheral edema.   Abdomen: Soft, no hepatosplenomegaly, no distention.  Skin: Intact without lesions or rashes.  Neurologic: Sedated.  Extremities: No clubbing or cyanosis.  HEENT: Normal.    Telemetry   Sinus tachy 110s (personally reviewed)  EKG    N/A  Labs    CBC Recent Labs    01/30/22 1900 01/31/22 0515  WBC 19.5* 21.4*  NEUTROABS 15.0* 15.9*  HGB 9.8* 10.1*  HCT 26.9* 28.2*  MCV 66.1* 66.2*  PLT 40* 22*   Basic Metabolic Panel Recent Labs    60/73/71 2330 01/30/22 0703 01/30/22 0936 01/30/22 1900 01/31/22 0515  NA 135 138   < > 137 140  K 3.1* 3.4*   < > 3.7 3.8  CL 94* 91*  --  98 99   CO2 18* 27  --  22 21*  GLUCOSE 185* 405*  --  125* 120*  BUN 52* 51*  --  57* 60*  CREATININE 3.08* 2.92*  --  2.80* 2.68*  CALCIUM 8.0* 7.2*  --  7.4* 7.3*  MG 2.2 2.0  --   --  2.3  PHOS 2.6  --   --   --  3.8   < > = values in this interval not displayed.   Liver Function Tests Recent Labs    01/28/22 2234 01/29/22 0537  AST 52* 58*  ALT 27 23  ALKPHOS 159* 65  BILITOT 1.5* 1.5*  PROT 6.9 4.7*  ALBUMIN 3.4* 2.2*   No results for input(s): "LIPASE", "AMYLASE" in the last 72 hours. Cardiac Enzymes Recent Labs    01/29/22 0537  CKTOTAL 886*    BNP: BNP (last 3 results) No results for input(s): "BNP" in the last 8760 hours.  ProBNP (last 3 results) No results for input(s): "PROBNP" in the last 8760 hours.   D-Dimer Recent Labs    01/29/22 2330  DDIMER >20.00*   Hemoglobin A1C No results for input(s): "HGBA1C" in the last 72 hours. Fasting Lipid Panel No results for input(s): "CHOL", "HDL", "LDLCALC", "TRIG", "  CHOLHDL", "LDLDIRECT" in the last 72 hours. Thyroid Function Tests Recent Labs    01/29/22 0537  TSH 0.870    Other results:   Imaging    DG Abd Portable 1V  Result Date: 01/30/2022 CLINICAL DATA:  Encounter for orogastric tube placement. EXAM: PORTABLE ABDOMEN - 1 VIEW COMPARISON:  01/30/2022 at 0852 hours FINDINGS: Orogastric tube extends into the left upper abdomen. The tip is in the gastric body region. Again noted is bowel gas and stool in the lower abdomen and pelvis. Densities at the left lung base could represent consolidation or atelectasis. Evidence for 2 central venous catheter tips in the region of the right atrium. Again noted is a left renal calculus. IMPRESSION: 1. Orogastric tube is in the stomach body region. 2. Concern for densities at the left lung base. Electronically Signed   By: Markus Daft M.D.   On: 01/30/2022 17:01     Medications:     Scheduled Medications:  sodium chloride   Intravenous Once   arformoterol  15 mcg  Nebulization BID   budesonide (PULMICORT) nebulizer solution  0.25 mg Nebulization BID   Chlorhexidine Gluconate Cloth  6 each Topical Q0600   fentaNYL (SUBLIMAZE) injection  50 mcg Intravenous Once   hydrocortisone sod succinate (SOLU-CORTEF) inj  100 mg Intravenous Q8H   insulin aspart  0-15 Units Subcutaneous Q4H   mouth rinse  15 mL Mouth Rinse Q2H   pantoprazole (PROTONIX) IV  40 mg Intravenous Q12H   sodium chloride flush  10-40 mL Intracatheter Q12H   sodium chloride flush  3 mL Intravenous Q12H    Infusions:  sodium chloride Stopped (01/29/22 1539)   sodium chloride Stopped (01/29/22 0431)   sodium chloride     calcium gluconate 2,000 mg (01/31/22 0841)   cefTRIAXone (ROCEPHIN)  IV Stopped (01/30/22 2241)   DOBUTamine     feeding supplement (VITAL 1.5 CAL) 20 mL/hr at 01/31/22 0500   fentaNYL infusion INTRAVENOUS 25 mcg/hr (01/31/22 0500)   norepinephrine (LEVOPHED) Adult infusion 24 mcg/min (01/31/22 0509)   vasopressin 0.04 Units/min (01/31/22 0633)    PRN Medications: Place/Maintain arterial line **AND** sodium chloride, sodium chloride, fentaNYL, ipratropium-albuterol, midazolam, mouth rinse, sodium chloride flush, sodium chloride flush    Patient Profile  61 y/o AAF w/ h/o HTN, COPD, anemia, h/o poor med compliance and prior admission to [facility] 9/23 for UTI and pyelonephritis, admitted w/ mixed septic and cardiogenic shock.    Assessment/Plan   1. Shock: Suspect mixed septic shock and cardiogenic shock.  Lactate was > 9 with PCT > 150.  E coli bacteremia with echo showing EF 20-25%.   - Broad spectrum abx.  - Support MAP with pressors, currently on NE 20 + vasopressin 0.04+ DBA 7.5 mcg. MAP stable, would wean down on vasopressin first.   - Continue HCO3 gtt per CCM.  2. Acute systolic CHF: Echo showed EF 20-25% with preservation of apical function but severe hypokinesis of other walls, normal RV, dilated IVC. Unusual wall motion abnormality pattern, prior echo was  normal.  ?Septic cardiomyopathy with reverse Takotsubo-type pattern.  HS-TnI was minimally elevated with no trend so doubt ACS. Co-ox 29% this morning, hope this is not accurate with creatinine trending down and co-ox 59% yesterday pm. CVP 11 on my read this morning, weight up.  - Resend co-ox stat.  - Can increase dobutamine to 10.  - MAP stable, she is on NE and vasopressin, would titrate down vasopressin first.  - Discussed with Dr. Elsworth Soho, will hold  off on diuresis today but weight rising and will need to start fluid removal soon.   - Would plan for LHC/RHC when stabilized and creatinine improved to rule out CAD.  3. AKI: In setting of septic shock.  Creatinine 4.13 => 3.5 => 2.9 => 2.68.  Gradual improvement.   - Follow BMET closely, has HD catheter but does not need at this time.  4. ID: E coli bacteremia.  PCT > 150 initially. WBCs 21.9 today.  CXR with bilateral infiltrates.  Had recent admission with pyelonephritis.  - On ceftriaxone.  5. Anemia: Hgb 10.1, not overtly bleeding. Got 1 unit PRBCs yesterday.  6. Thrombocytopenia: plts 17 => 22, suspect due to inflammation/sepsis.  7. Acute hypoxemic respiratory failure: PNA on CXR, also suspect pulmonary edema. Intubated.  - Per CCM.   CRITICAL CARE Performed by: Loralie Champagne  Total critical care time: 35 minutes  Critical care time was exclusive of separately billable procedures and treating other patients.  Critical care was necessary to treat or prevent imminent or life-threatening deterioration.  Critical care was time spent personally by me on the following activities: development of treatment plan with patient and/or surrogate as well as nursing, discussions with consultants, evaluation of patient's response to treatment, examination of patient, obtaining history from patient or surrogate, ordering and performing treatments and interventions, ordering and review of laboratory studies, ordering and review of radiographic studies,  pulse oximetry and re-evaluation of patient's condition.   Length of Stay: 2  Loralie Champagne, MD  01/31/2022, 9:02 AM  Advanced Heart Failure Team Pager [phone] (M-F; 7a - 5p)  Please contact [facility] for night-coverage after hours (5p -7a ) and weekends on [email or web address]

## 2022-02-01 ENCOUNTER — Inpatient Hospital Stay (HOSPITAL_COMMUNITY): Payer: Self-pay

## 2022-02-01 LAB — COOXEMETRY PANEL
Carboxyhemoglobin: 1.9 % — ABNORMAL HIGH (ref 0.5–1.5)
Carboxyhemoglobin: 1.7 % — ABNORMAL HIGH (ref 0.5–1.5)
Methemoglobin: 2 % — ABNORMAL HIGH (ref 0.0–1.5)
Methemoglobin: <0.7 % (ref 0.0–1.5)
O2 Saturation: 76.9 %
O2 Saturation: 83.3 %
Total hemoglobin: 10.7 g/dL — ABNORMAL LOW (ref 12.0–16.0)
Total hemoglobin: 9.8 g/dL — ABNORMAL LOW (ref 12.0–16.0)

## 2022-02-01 LAB — CBC WITH DIFFERENTIAL/PLATELET
Abs Immature Granulocytes: 2.42 10*3/uL — ABNORMAL HIGH (ref 0.00–0.07)
Basophils Absolute: 0.1 10*3/uL (ref 0.0–0.1)
Basophils Relative: 0 %
Eosinophils Absolute: 0 10*3/uL (ref 0.0–0.5)
Eosinophils Relative: 0 %
HCT: 28.6 % — ABNORMAL LOW (ref 36.0–46.0)
Hemoglobin: 10.1 g/dL — ABNORMAL LOW (ref 12.0–15.0)
Immature Granulocytes: 14 %
Lymphocytes Relative: 5 %
Lymphs Abs: 0.8 10*3/uL (ref 0.7–4.0)
MCH: 23.4 pg — ABNORMAL LOW (ref 26.0–34.0)
MCHC: 35.3 g/dL (ref 30.0–36.0)
MCV: 66.2 fL — ABNORMAL LOW (ref 80.0–100.0)
Monocytes Absolute: 1 10*3/uL (ref 0.1–1.0)
Monocytes Relative: 5 %
Neutro Abs: 13.6 10*3/uL — ABNORMAL HIGH (ref 1.7–7.7)
Neutrophils Relative %: 76 %
Platelets: 11 10*3/uL — CL (ref 150–400)
RBC: 4.32 MIL/uL (ref 3.87–5.11)
RDW: 26.8 % — ABNORMAL HIGH (ref 11.5–15.5)
WBC: 17.9 10*3/uL — ABNORMAL HIGH (ref 4.0–10.5)
nRBC: 0.2 % (ref 0.0–0.2)

## 2022-02-01 LAB — MAGNESIUM: Magnesium: 2.3 mg/dL (ref 1.7–2.4)

## 2022-02-01 LAB — LACTIC ACID, PLASMA: Lactic Acid, Venous: 1.9 mmol/L (ref 0.5–1.9)

## 2022-02-01 LAB — GLUCOSE, CAPILLARY
Glucose-Capillary: 128 mg/dL — ABNORMAL HIGH (ref 70–99)
Glucose-Capillary: 192 mg/dL — ABNORMAL HIGH (ref 70–99)
Glucose-Capillary: 192 mg/dL — ABNORMAL HIGH (ref 70–99)
Glucose-Capillary: 222 mg/dL — ABNORMAL HIGH (ref 70–99)
Glucose-Capillary: 91 mg/dL (ref 70–99)
Glucose-Capillary: 93 mg/dL (ref 70–99)
Glucose-Capillary: <10 mg/dL — CL (ref 70–99)

## 2022-02-01 LAB — BASIC METABOLIC PANEL
Anion gap: 12 (ref 5–15)
BUN: 71 mg/dL — ABNORMAL HIGH (ref 8–23)
CO2: 26 mmol/L (ref 22–32)
Calcium: 8 mg/dL — ABNORMAL LOW (ref 8.9–10.3)
Chloride: 105 mmol/L (ref 98–111)
Creatinine, Ser: 2.58 mg/dL — ABNORMAL HIGH (ref 0.44–1.00)
GFR, Estimated: 20 mL/min — ABNORMAL LOW (ref >60)
Glucose, Bld: 173 mg/dL — ABNORMAL HIGH (ref 70–99)
Potassium: 3.1 mmol/L — ABNORMAL LOW (ref 3.5–5.1)
Sodium: 143 mmol/L (ref 135–145)

## 2022-02-01 LAB — PATHOLOGIST SMEAR REVIEW

## 2022-02-01 LAB — PHOSPHORUS: Phosphorus: 2.3 mg/dL — ABNORMAL LOW (ref 2.5–4.6)

## 2022-02-01 MED ORDER — POTASSIUM CHLORIDE 20 MEQ PO PACK
20.0000 meq | PACK | Freq: Once | ORAL | Status: AC
  Administered 2022-02-01: 20 meq
  Filled 2022-02-01: qty 1

## 2022-02-01 MED ORDER — HYDROCORTISONE SOD SUC (PF) 100 MG IJ SOLR
100.0000 mg | Freq: Two times a day (BID) | INTRAMUSCULAR | Status: DC
  Administered 2022-02-01 – 2022-02-03 (×4): 100 mg via INTRAVENOUS
  Filled 2022-02-01 (×6): qty 2

## 2022-02-01 MED ORDER — INSULIN ASPART 100 UNIT/ML IJ SOLN
0.0000 [IU] | Freq: Three times a day (TID) | INTRAMUSCULAR | Status: DC
  Administered 2022-02-01: 3 [IU] via SUBCUTANEOUS
  Administered 2022-02-01: 1 [IU] via SUBCUTANEOUS

## 2022-02-01 MED ORDER — HYDROCORTISONE SOD SUC (PF) 100 MG IJ SOLR
50.0000 mg | Freq: Three times a day (TID) | INTRAMUSCULAR | Status: DC
  Filled 2022-02-01: qty 1

## 2022-02-01 MED ORDER — CEFAZOLIN SODIUM-DEXTROSE 2-4 GM/100ML-% IV SOLN
2.0000 g | Freq: Two times a day (BID) | INTRAVENOUS | Status: DC
  Administered 2022-02-01 – 2022-02-04 (×6): 2 g via INTRAVENOUS
  Filled 2022-02-01 (×8): qty 100

## 2022-02-01 MED ORDER — SODIUM PHOSPHATES 45 MMOLE/15ML IV SOLN
15.0000 mmol | Freq: Once | INTRAVENOUS | Status: AC
  Administered 2022-02-01: 15 mmol via INTRAVENOUS
  Filled 2022-02-01: qty 5

## 2022-02-01 MED ORDER — ONDANSETRON HCL 4 MG/2ML IJ SOLN
4.0000 mg | Freq: Once | INTRAMUSCULAR | Status: AC
  Administered 2022-02-01: 4 mg via INTRAVENOUS
  Filled 2022-02-01: qty 2

## 2022-02-01 MED ORDER — SODIUM CHLORIDE 0.9% IV SOLUTION: Freq: Once | INTRAVENOUS | Status: AC

## 2022-02-01 MED ORDER — PEPTAMEN 1.5 CAL PO LIQD
1000.0000 mL | ORAL | Status: DC
  Administered 2022-02-01: 1000 mL
  Filled 2022-02-01: qty 1000

## 2022-02-01 NOTE — Plan of Care (Signed)
  Problem: Education: Goal: Knowledge of General Education information will improve Description: Including pain rating scale, medication(s)/side effects and non-pharmacologic comfort measures Outcome: Progressing   Problem: Clinical Measurements: Goal: Diagnostic test results will improve Outcome: Progressing Goal: Respiratory complications will improve Outcome: Progressing Goal: Cardiovascular complication will be avoided Outcome: Progressing   Problem: Safety: Goal: Ability to remain free from injury will improve Outcome: Progressing   

## 2022-02-01 NOTE — Progress Notes (Addendum)
eLink Physician-Brief Progress Note Patient Name: Nicole Cunningham DOB: [birth year] MRN: [id number]   Date of Service  02/01/2022  HPI/Events of Note  Nursing reports that the patient has vomited several times today and tube feeds were noted in ETT. Already has CXR ordered for 12 AM. QTc interval = 0.454 seconds.   eICU Interventions  Plan: Await CXR from 12 AM. Continue to hold Tube feeds until patient is reassessed in the AM. Zofran 4 mg IV now. Gastric tube to LIS.     Intervention Category Major Interventions: Other:  Lenell Antu 02/01/2022, 10:58 PM

## 2022-02-01 NOTE — Inpatient Diabetes Management (Signed)
Inpatient Diabetes Program Recommendations  AACE/ADA: New Consensus Statement on Inpatient Glycemic Control (2015)  Target Ranges:  Prepandial:   less than 140 mg/dL      Peak postprandial:   less than 180 mg/dL (1-2 hours)      Critically ill patients:  140 - 180 mg/dL   Lab Results  Component Value Date   GLUCAP 222 (H) 02/01/2022   HGBA1C 5.3 04/18/2020    Review of Glycemic Control  Latest Reference Range & Units 01/31/22 16:07 01/31/22 20:03 01/31/22 20:08 01/31/22 23:29 02/01/22 04:17 02/01/22 07:58 02/01/22 11:42  Glucose-Capillary 70 - 99 mg/dL 867 (H) <67 (LL) 209 (H) 233 (H) 91 <10 (LL) 192 (H) 222 (H)  (LL): Data is critically low  Current orders for Inpatient glycemic control:  Novolog 0-9 units TID  Inpatient Diabetes Program Recommendations:    Noted questionable hypoglycemia this AM and subsequent discontinuation of insulin. Since TID added. Since patient NPO, consider switching correction to Novolog 0-6 units Q4H.  Secure chat sent to Md.  Thanks, Lujean Rave, MSN, RNC-OB Diabetes Coordinator [phone] (8a-5p)

## 2022-02-01 NOTE — Progress Notes (Signed)
Patient ID: Nicole Cunningham, female   DOB: 1960-08-28, 61 y.o.   MRN: [id number]     Advanced Heart Failure Rounding Note  PCP-Cardiologist: None   Subjective:    11/6 milrinone stopped. Switched to dobutamine.   Intubated/sedated  DBA 10 mcg + NE 15 mcg, off vasopressin. She is on hydrocortisone stress dose.  SBP now consistently in 130s. Co-ox 77% this morning, CVP 8 on my read.  Lactate 1.9. Creatinine mildly lower 2.68 => 2.58.   Still with low plts, 11K this morning.    Tm 100.6, remains on cefazolin for E coli bacteremia.    Objective:   Weight Range: 47.8 kg Body mass index is 18.09 kg/m.   Vital Signs:   Temp:  [98 F (36.7 C)-100.6 F (38.1 C)] 98.9 F (37.2 C) (11/09 0800) Pulse Rate:  [116-139] 119 (11/09 0600) Resp:  [15-33] 20 (11/09 0600) BP: (88-133)/(59-110) 126/92 (11/09 0600) SpO2:  [86 %-100 %] 100 % (11/09 0600) FiO2 (%):  [50 %-60 %] 50 % (11/09 0305) Weight:  [47.8 kg] 47.8 kg (11/09 0458) Last BM Date : 01/31/22  Weight change: Filed Weights   01/30/22 0500 01/31/22 0125 02/01/22 0458  Weight: 46 kg 47.6 kg 47.8 kg    Intake/Output:   Intake/Output Summary (Last 24 hours) at 02/01/2022 0832 Last data filed at 02/01/2022 0658 Gross per 24 hour  Intake 1835.08 ml  Output 545 ml  Net 1290.08 ml      Physical Exam    General: Intubated.  Neck: No JVD, no thyromegaly or thyroid nodule.  Lungs: Clear to auscultation bilaterally with normal respiratory effort. CV: Nondisplaced PMI.  Heart tachy, regular S1/S2, no S3/S4, no murmur.  No peripheral edema.   Abdomen: Soft, nontender, no hepatosplenomegaly, no distention.  Skin: Intact without lesions or rashes.  Neurologic: Opening eyes this morning.  Extremities: Cool extremities.   HEENT: Normal.   Telemetry   Sinus tachy 110s-120s (personally reviewed)  EKG    N/A  Labs    CBC Recent Labs    01/31/22 0515 01/31/22 0956 02/01/22 0528  WBC 21.4*  --  17.9*  NEUTROABS 15.9*   --  13.6*  HGB 10.1* 9.9* 10.1*  HCT 28.2* 29.0* 28.6*  MCV 66.2*  --  66.2*  PLT 22*  --  11*   Basic Metabolic Panel Recent Labs    46/27/03 0515 01/31/22 0956 02/01/22 0528  NA 140 138 143  K 3.8 4.0 3.1*  CL 99  --  105  CO2 21*  --  26  GLUCOSE 120*  --  173*  BUN 60*  --  71*  CREATININE 2.68*  --  2.58*  CALCIUM 7.3*  --  8.0*  MG 2.3  --  2.3  PHOS 3.8  --  2.3*   Liver Function Tests No results for input(s): "AST", "ALT", "ALKPHOS", "BILITOT", "PROT", "ALBUMIN" in the last 72 hours.  No results for input(s): "LIPASE", "AMYLASE" in the last 72 hours. Cardiac Enzymes No results for input(s): "CKTOTAL", "CKMB", "CKMBINDEX", "TROPONINI" in the last 72 hours.   BNP: BNP (last 3 results) No results for input(s): "BNP" in the last 8760 hours.  ProBNP (last 3 results) No results for input(s): "PROBNP" in the last 8760 hours.   D-Dimer Recent Labs    01/29/22 2330  DDIMER >20.00*   Hemoglobin A1C No results for input(s): "HGBA1C" in the last 72 hours. Fasting Lipid Panel No results for input(s): "CHOL", "HDL", "LDLCALC", "TRIG", "CHOLHDL", "LDLDIRECT" in the  last 72 hours. Thyroid Function Tests No results for input(s): "TSH", "T4TOTAL", "T3FREE", "THYROIDAB" in the last 72 hours.  Invalid input(s): "FREET3"   Other results:   Imaging    DG Chest Port 1 View  Result Date: 02/01/2022 CLINICAL DATA:  Acute respiratory failure (HCC) EXAM: PORTABLE CHEST 1 VIEW COMPARISON:  Chest x-ray January 31, 2022. FINDINGS: Similar diffuse interstitial opacities with mildly increased bibasilar opacities, probably combination of layering bilateral pleural effusions overlying atelectasis and/or consolidation. No visible pneumothorax. Similar cardiomediastinal silhouette. Endotracheal tube tip at the level of clavicular heads. Right IJ central venous catheter tip near the superior cavoatrial junction. Enteric tube courses below the diaphragm and outside the field of  view. Polyarticular degenerative change. IMPRESSION: Similar diffuse interstitial opacities. Mildly increased bibasilar opacities, probably a combination of layering bilateral pleural effusions overlying atelectasis and/or consolidation. Electronically Signed   By: Feliberto Harts M.D.   On: 02/01/2022 08:15   DG Abd Portable 1V  Result Date: 01/31/2022 CLINICAL DATA:  Feeding tube placement EXAM: PORTABLE ABDOMEN - 1 VIEW COMPARISON:  01/30/2022 FINDINGS: There is interval exchange of enteric tubes with feeding tube in the distal antrum of stomach in the current study. There is 1.4 cm calcific density overlying the left kidney. Linear densities are seen in the lower lung fields suggesting subsegmental atelectasis. Small left pleural effusion is seen. IMPRESSION: Tip of feeding tube is seen in the distal antrum of the stomach. Left renal calculus. Electronically Signed   By: Ernie Avena M.D.   On: 01/31/2022 15:22   DG Chest Port 1 View  Result Date: 01/31/2022 CLINICAL DATA:  Acute respiratory failure. EXAM: PORTABLE CHEST 1 VIEW COMPARISON:  01/30/2022 FINDINGS: The endotracheal tube, NG tube, left PICC line and right IJ central venous catheters are stable. The cardiac silhouette, mediastinal and hilar contours are within normal limits. Persistent diffuse interstitial and airspace process in the lungs. No pneumothorax or pleural effusion. IMPRESSION: 1. Stable support apparatus. 2. Persistent diffuse interstitial and airspace process. Electronically Signed   By: Rudie Meyer M.D.   On: 01/31/2022 13:04     Medications:     Scheduled Medications:  sodium chloride   Intravenous Once   sodium chloride   Intravenous Once   arformoterol  15 mcg Nebulization BID   budesonide (PULMICORT) nebulizer solution  0.25 mg Nebulization BID   Chlorhexidine Gluconate Cloth  6 each Topical Q0600   fentaNYL (SUBLIMAZE) injection  50 mcg Intravenous Once   hydrocortisone sod succinate (SOLU-CORTEF) inj   100 mg Intravenous Q12H   mouth rinse  15 mL Mouth Rinse Q2H   pantoprazole (PROTONIX) IV  40 mg Intravenous Q12H   sodium chloride flush  10-40 mL Intracatheter Q12H   sodium chloride flush  3 mL Intravenous Q12H    Infusions:  sodium chloride 250 mL (02/01/22 0041)   sodium chloride Stopped (01/29/22 0431)   sodium chloride      ceFAZolin (ANCEF) IV     DOBUTamine 7.5 mcg/kg/min (02/01/22 0814)   feeding supplement (VITAL 1.5 CAL) 50 mL/hr at 02/01/22 0600   fentaNYL infusion INTRAVENOUS 25 mcg/hr (02/01/22 0600)   norepinephrine (LEVOPHED) Adult infusion 10 mcg/min (02/01/22 0812)   sodium phosphate 15 mmol in dextrose 5 % 250 mL infusion     vasopressin Stopped (01/31/22 1316)    PRN Medications: Place/Maintain arterial line **AND** sodium chloride, sodium chloride, fentaNYL, ipratropium-albuterol, midazolam, mouth rinse, sodium chloride flush, sodium chloride flush    Patient Profile  61 y/o AAF w/  h/o HTN, COPD, anemia, h/o poor med compliance and prior admission to [facility] 9/23 for UTI and pyelonephritis, admitted w/ mixed septic and cardiogenic shock.    Assessment/Plan   1. Shock: Suspect mixed septic shock and cardiogenic shock.  Lactate was > 9 with PCT > 150.  E coli bacteremia with echo showing EF 20-25%.  Lactate now down to 1.9. BP improved, SBP 130s today.  - Cefazolin for E coli bacteremia.   - Stress dose hydrocortisone.  - Wean down NE today.  2. Acute systolic CHF: Echo showed EF 20-25% with preservation of apical function but severe hypokinesis of other walls, normal RV, dilated IVC. Unusual wall motion abnormality pattern, prior echo was normal.  ?Septic cardiomyopathy with reverse Takotsubo-type pattern.  HS-TnI was minimally elevated with no trend so doubt ACS. Co-ox 77% this morning.  Lactate 1.9 (normal) with creatinine slightly lower at 2.58.  CVP 8.  - Decrease dobutamine to 7.5.  - Wean down NE.  - Discussed with Dr. Elsworth Soho, will hold off on diuresis  today as we decrease pressor dose.  CVP only 8.    - Would plan for LHC/RHC when stabilized and creatinine improved to rule out CAD.  3. AKI: In setting of septic shock.  Creatinine 4.13 => 3.5 => 2.9 => 2.68 => 2.58.  Gradual improvement.   - Follow BMET closely.  4. ID: E coli bacteremia.  PCT > 150 initially. WBCs 21.9 => 17.9 today.  CXR with bilateral infiltrates.  Had recent admission with pyelonephritis. Tm 100.6.  - On cefazolin.  5. Anemia: Hgb 10.1, not overtly bleeding.  6. Thrombocytopenia: plts still low at 11K, suspect due to inflammation/sepsis.  - 1 unit Plts today.  7. Acute hypoxemic respiratory failure: PNA on CXR, also suspect pulmonary edema. Intubated.  - Per CCM.   CRITICAL CARE Performed by: Loralie Champagne  Total critical care time: 35 minutes  Critical care time was exclusive of separately billable procedures and treating other patients.  Critical care was necessary to treat or prevent imminent or life-threatening deterioration.  Critical care was time spent personally by me on the following activities: development of treatment plan with patient and/or surrogate as well as nursing, discussions with consultants, evaluation of patient's response to treatment, examination of patient, obtaining history from patient or surrogate, ordering and performing treatments and interventions, ordering and review of laboratory studies, ordering and review of radiographic studies, pulse oximetry and re-evaluation of patient's condition.   Length of Stay: 3  Loralie Champagne, MD  02/01/2022, 8:32 AM  Advanced Heart Failure Team Pager [phone] (M-F; 7a - 5p)  Please contact [facility] for night-coverage after hours (5p -7a ) and weekends on [email or web address]

## 2022-02-01 NOTE — Progress Notes (Signed)
NAME:  Nicole Cunningham, MRN:  [id number], DOB:  1960-07-07, LOS: 3 ADMISSION DATE:  01/28/2022, CONSULTATION DATE:  11/6 REFERRING MD:  Dr. Sedonia Small, CHIEF COMPLAINT:  septic shock   History of Present Illness:  Patient is a 61 year old female with pertinent PMH of COPD, anemia, HTN presents to [facility] ED on 11/6 with AMS.  Patient recently admitted to [facility] with AMS on 11/2021.  Found to have UTI and pyelonephritis.  On 11/5, patient having increased confusion.  Having some SOB over the past couple of days.  Brought to [facility] ED for further eval.  Upon arrival to [facility] ED, patient confused but able to state name.  Admitted with shock, AKI, lactic acidosis of 9.0  Pertinent ED labs: CO2 14, BUN 51, creat 4.13, AST 52, alk phos 159, AG 23, WBC 1.3, Hgb 9.3, platelets 70, troponin 64 then 80.  CT head without acute abnormality.  CXR no significant findings.  CT abdomen pelvis 17 mm nonobstructive renal calculus without hydronephrosis.  Found to have E. coli bacteremia and EF 20 to 25% on echo, mixed cardiogenic and septic shock  Pertinent  Medical History   Past Medical History:  Diagnosis Date   Bronchitis    COPD (chronic obstructive pulmonary disease) ([place])    Hypertension      Significant Hospital Events: Including procedures, antibiotic start and stop dates in addition to other pertinent events   11/6 septic shock on levo , ETT >> 11/6 Echo EF 20 to 25% global hypokinesis, apex intact 11/6 RIJ HD cath >> dobutamine at 5 mics 11/7>>Dobutamine increased to 7.5 mics , 1 unit PRBC and 1 unit platelet transfusion 11/8 dobutamine increased to 10 mics  Interim History / Subjective:   Critically ill, intubated. On decreased dose of Levophed 12 mics, tachycardic 130s Urine output remains poor Low-grade febrile  Objective   Blood pressure 124/87, pulse (!) 116, temperature 98.9 F (37.2 C), temperature source Oral, resp. rate 20, height _0  (1.626 m), weight 47.8 kg, SpO2 100 %. CVP:  [5  mmHg-13 mmHg] 5 mmHg  Vent Mode: PRVC FiO2 (%):  [50 %-60 %] 50 % Set Rate:  [20 bmp] 20 bmp Vt Set:  [430 mL] 430 mL PEEP:  [8 cmH20] 8 cmH20 Plateau Pressure:  [18 [id number] cmH20] 18 cmH20   Intake/Output Summary (Last 24 hours) at 02/01/2022 1002 Last data filed at 02/01/2022 0900 Gross per 24 hour  Intake 1885.97 ml  Output 645 ml  Net 1240.97 ml    Filed Weights   01/30/22 0500 01/31/22 0125 02/01/22 0458  Weight: 46 kg 47.6 kg 47.8 kg    Examination: General: Thin cachectic ill appearing female, no distress HEENT: MM pink/dry Neuro: RASS S1 on low-dose fentanyl drip, follows one-step commands CV: s1s2, tachycardia , no m/r/g PULM: Clear breath sounds bilateral no rhonchi, no accessory muscle use GI: soft, bsx4 active  Extremities: Cool extremities Skin: Cyanotic fingers  Chest x-ray independently reviewed shows stable bilateral upper lobe infiltrates, increased bibasilar infiltrates versus effusion X-ray abdomen shows repositioned OG tube    Labs show high BUN stable creatinine,  leukocytosis, severe thrombocytopenia Co. ox improved to 77% on 10 mics dobutamine Schistocytes and target cells noted but not on pathologist smear review Lactate decreased to 1.9  Resolved Hospital Problem list     Assessment & Plan:  Mixed cardiogenic and septic shock: E. coli likely urinary source 11/9 Improvement in lactic acidosis and Co. 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, reverse Takotsubo pattern  P: -Simplify antibiotics from ceftriaxone to Ancef -DeCrease dobutamine to 7.5 mics and repeat Co-ox, lactate clearance is at bedside -Will eventually need LHC/RHC -Concern for peripheral limb ischemia, decrease Levophed to minimal doses require to maintain SBP 90  Acute encephalopathy: Likely sepsis related -CT head without acute abnormality -UDS, ethanol, salicylate/acetaminophen negative P: -Fentanyl drip with goal RASS 0 to -1, can transition to intermittent  Acute  respiratory failure with hypoxia COPD: On Breo Ellipta Multifocal pneumonia, new bilateral upper lobe infiltrates?  Fluid versus aspiration P: --Pulmicort and Brovana -As needed DuoNeb for wheezing -Start spontaneous breathing trials   AKI , improving AGMA-LA P: -Trend BMP / urinary output, will need diuretics once off Levophed -Replace electrolytes as indicated -Avoid nephrotoxic agents, ensure adequate renal perfusion  Pancytopenia: nadir 1.3, likely related to sepsis Severe thrombocytopenia, s/p 1 unit 11/7 & 11/8 Hx of iron def anemia: Requiring frequent transfusions No schistocytes on pathologist review P: -Goal hemoglobin 8 and above, goal platelets 10 K and above unless bleeding -Transfuse 1 unit platelets -trend cbc -Consider heme/onc consult  Acute blood loss anemia/upper GI bleed s/p 1 U prbc on 11/7 Mildly elevated LFTs -CT abdomen pelvis negative for acute findings P: -PPI twice daily  QTc prolongation P: -Avoid QTc prolonging agents -Check mag -Telemetry monitoring  Renal calculus: 17 mm nonobstructing renal calculus without hydronephrosis P: -Will need urology consult at some point  Hx of anxiety/depression P: -Hold home anxiety meds given QTc 520  Best Practice (right click and "Reselect all SmartList Selections" daily)   Diet/type: tubefeeds and NPO  DVT prophylaxis: SCD GI prophylaxis: PPI Lines: Central line and yes and it is still needed Foley:  Yes, and it is still needed Code Status:  full code Last date of multidisciplinary goals of care discussion [11/8 updated  spouse Fritz Pickerel ]  Labs   CBC: Recent Labs  Lab 01/29/22 2142 01/29/22 2330 01/30/22 0703 01/30/22 0936 01/30/22 1217 01/30/22 1900 01/31/22 0515 01/31/22 0956 02/01/22 0528  WBC 12.0* 14.0* 13.7*  --   --  19.5* 21.4*  --  17.9*  NEUTROABS 11.2* 13.1*  --   --   --  15.0* 15.9*  --  13.6*  HGB 8.4* 8.2* 7.2*   < > 7.8* 9.8* 10.1* 9.9* 10.1*  HCT 23.0* 23.3* 20.6*   <  > 23.0* 26.9* 28.2* 29.0* 28.6*  MCV 62.5* 62.8* 62.4*  --   --  66.1* 66.2*  --  66.2*  PLT 27* 23*  24* 17*  --   --  40* 22*  --  11*   < > = values in this interval not displayed.     Basic Metabolic Panel: Recent Labs  Lab 01/29/22 0537 01/29/22 0921 01/29/22 2330 01/30/22 0703 01/30/22 0936 01/30/22 1217 01/30/22 1900 01/31/22 0515 01/31/22 0956 02/01/22 0528  NA 135   < > 135 138   < > 136 137 140 138 143  K 3.1*   < > 3.1* 3.4*   < > 3.7 3.7 3.8 4.0 3.1*  CL 108   < > 94* 91*  --   --  98 99  --  105  CO2 9*   < > 18* 27  --   --  22 21*  --  26  GLUCOSE 102*   < > 185* 405*  --   --  125* 120*  --  173*  BUN 49*   < > 52* 51*  --   --  57* 60*  --  71*  CREATININE 3.70*   < > 3.08* 2.92*  --   --  2.80* 2.68*  --  2.58*  CALCIUM 8.5*   < > 8.0* 7.2*  --   --  7.4* 7.3*  --  8.0*  MG 1.3*  --  2.2 2.0  --   --   --  2.3  --  2.3  PHOS 2.6  --  2.6  --   --   --   --  3.8  --  2.3*   < > = values in this interval not displayed.    GFR: Estimated Creatinine Clearance: 16.8 mL/min (A) (by C-G formula based on SCr of 2.58 mg/dL (H)). Recent Labs  Lab 01/29/22 0537 01/29/22 0841 01/29/22 2322 01/29/22 2330 01/30/22 0703 01/30/22 0704 01/30/22 1900 01/31/22 0515 01/31/22 0858 02/01/22 0528  PROCALCITON >150.00  --   --   --   --   --   --   --   --   --   WBC  --    < >  --    < > 13.7*  --  19.5* 21.4*  --  17.9*  LATICACIDVEN 8.4*   < > 7.8*  --   --  8.1*  --   --  7.4* 1.9   < > = values in this interval not displayed.     Liver Function Tests: Recent Labs  Lab 01/28/22 2234 01/29/22 0537  AST 52* 58*  ALT 27 23  ALKPHOS 159* 65  BILITOT 1.5* 1.5*  PROT 6.9 4.7*  ALBUMIN 3.4* 2.2*    No results for input(s): "LIPASE", "AMYLASE" in the last 168 hours. Recent Labs  Lab 01/29/22 0537  AMMONIA 36*     ABG    Component Value Date/Time   PHART 7.444 01/31/2022 0956   PCO2ART 28.2 (L) 01/31/2022 0956   PO2ART 52 (L) 01/31/2022 0956    HCO3 19.3 (L) 01/31/2022 0956   TCO2 20 (L) 01/31/2022 0956   ACIDBASEDEF 4.0 (H) 01/31/2022 0956   O2SAT 76.9 02/01/2022 0533     Coagulation Profile: Recent Labs  Lab 01/28/22 2234 01/29/22 2330  INR 1.8* 2.2*     Cardiac Enzymes: Recent Labs  Lab 01/29/22 0537  CKTOTAL 886*     HbA1C: Hgb A1c MFr Bld  Date/Time Value Ref Range Status  04/18/2020 05:13 PM 5.3 4.8 - 5.6 % Final    Comment:    (NOTE) Pre diabetes:          5.7%-6.4%  Diabetes:              >6.4%  Glycemic control for   <7.0% adults with diabetes     CBG: Recent Labs  Lab 01/31/22 2003 01/31/22 2008 01/31/22 2329 02/01/22 0417 02/01/22 0758  GLUCAP <10* 101* 233* 91  <10* 192*     Critical care time: 38 minutes      Kara Mead MD. FCCP. [facility] Pager : [phone]  If no response to pager , please call [phone] until 7 pm After 7:00 pm call Elink  [phone]   ' 02/01/2022, 10:02 AM

## 2022-02-01 NOTE — Progress Notes (Signed)
eLink Physician-Brief Progress Note Patient Name: Nicole Cunningham DOB: Jul 31, 1960 MRN: [id number]   Date of Service  02/01/2022  HPI/Events of Note  Hypophosphatemia - PO4--- = 2.3. Hypokalemia - K+ = 3.1 and Creatinine = 2.58.  eICU Interventions  Will replace K+ and PO4---.     Intervention Category Major Interventions: Electrolyte abnormality - evaluation and management  Maninder Deboer Eugene 02/01/2022, 7:02 AM

## 2022-02-02 ENCOUNTER — Inpatient Hospital Stay (HOSPITAL_COMMUNITY): Payer: Self-pay

## 2022-02-02 LAB — LACTATE DEHYDROGENASE, PLEURAL OR PERITONEAL FLUID: LD, Fluid: 174 U/L — ABNORMAL HIGH (ref 3–23)

## 2022-02-02 LAB — GLUCOSE, CAPILLARY
Glucose-Capillary: 172 mg/dL — ABNORMAL HIGH (ref 70–99)
Glucose-Capillary: 177 mg/dL — ABNORMAL HIGH (ref 70–99)
Glucose-Capillary: 171 mg/dL — ABNORMAL HIGH (ref 70–99)
Glucose-Capillary: 197 mg/dL — ABNORMAL HIGH (ref 70–99)
Glucose-Capillary: 204 mg/dL — ABNORMAL HIGH (ref 70–99)
Glucose-Capillary: 293 mg/dL — ABNORMAL HIGH (ref 70–99)

## 2022-02-02 LAB — HEMOGLOBIN A1C
Hgb A1c MFr Bld: 5.4 % (ref 4.8–5.6)
Mean Plasma Glucose: 108.28 mg/dL

## 2022-02-02 LAB — BASIC METABOLIC PANEL
Anion gap: 13 (ref 5–15)
Anion gap: 11 (ref 5–15)
BUN: 56 mg/dL — ABNORMAL HIGH (ref 8–23)
BUN: 53 mg/dL — ABNORMAL HIGH (ref 8–23)
CO2: 26 mmol/L (ref 22–32)
CO2: 25 mmol/L (ref 22–32)
Calcium: 7.3 mg/dL — ABNORMAL LOW (ref 8.9–10.3)
Calcium: 6.8 mg/dL — ABNORMAL LOW (ref 8.9–10.3)
Chloride: 107 mmol/L (ref 98–111)
Chloride: 110 mmol/L (ref 98–111)
Creatinine, Ser: 2.08 mg/dL — ABNORMAL HIGH (ref 0.44–1.00)
Creatinine, Ser: 1.97 mg/dL — ABNORMAL HIGH (ref 0.44–1.00)
GFR, Estimated: 26 mL/min — ABNORMAL LOW (ref >60)
GFR, Estimated: 28 mL/min — ABNORMAL LOW (ref >60)
Glucose, Bld: 189 mg/dL — ABNORMAL HIGH (ref 70–99)
Glucose, Bld: 200 mg/dL — ABNORMAL HIGH (ref 70–99)
Potassium: 2.8 mmol/L — ABNORMAL LOW (ref 3.5–5.1)
Potassium: 4.3 mmol/L (ref 3.5–5.1)
Sodium: 146 mmol/L — ABNORMAL HIGH (ref 135–145)
Sodium: 146 mmol/L — ABNORMAL HIGH (ref 135–145)

## 2022-02-02 LAB — BODY FLUID CELL COUNT WITH DIFFERENTIAL
Eos, Fluid: 0 %
Lymphs, Fluid: 11 %
Monocyte-Macrophage-Serous Fluid: 16 % — ABNORMAL LOW (ref 50–90)
Neutrophil Count, Fluid: 73 % — ABNORMAL HIGH (ref 0–25)
Total Nucleated Cell Count, Fluid: 401 cu mm (ref 0–1000)

## 2022-02-02 LAB — CBC WITH DIFFERENTIAL/PLATELET
Abs Immature Granulocytes: 0.08 10*3/uL — ABNORMAL HIGH (ref 0.00–0.07)
Basophils Absolute: 0.1 10*3/uL (ref 0.0–0.1)
Basophils Relative: 1 %
Eosinophils Absolute: 0 10*3/uL (ref 0.0–0.5)
Eosinophils Relative: 0 %
HCT: 28.1 % — ABNORMAL LOW (ref 36.0–46.0)
Hemoglobin: 9.6 g/dL — ABNORMAL LOW (ref 12.0–15.0)
Immature Granulocytes: 1 %
Lymphocytes Relative: 6 %
Lymphs Abs: 0.6 10*3/uL — ABNORMAL LOW (ref 0.7–4.0)
MCH: 23.3 pg — ABNORMAL LOW (ref 26.0–34.0)
MCHC: 34.2 g/dL (ref 30.0–36.0)
MCV: 68.2 fL — ABNORMAL LOW (ref 80.0–100.0)
Monocytes Absolute: 0.6 10*3/uL (ref 0.1–1.0)
Monocytes Relative: 6 %
Neutro Abs: 8.9 10*3/uL — ABNORMAL HIGH (ref 1.7–7.7)
Neutrophils Relative %: 86 %
Platelets: 19 10*3/uL — CL (ref 150–400)
RBC: 4.12 MIL/uL (ref 3.87–5.11)
RDW: 27.8 % — ABNORMAL HIGH (ref 11.5–15.5)
WBC: 10.3 10*3/uL (ref 4.0–10.5)
nRBC: 0.7 % — ABNORMAL HIGH (ref 0.0–0.2)

## 2022-02-02 LAB — BPAM PLATELET PHERESIS
Blood Product Expiration Date: 202311092359
ISSUE DATE / TIME: 202311090948
Unit Type and Rh: 6200

## 2022-02-02 LAB — PREPARE PLATELET PHERESIS: Unit division: 0

## 2022-02-02 LAB — PHOSPHORUS: Phosphorus: 3.3 mg/dL (ref 2.5–4.6)

## 2022-02-02 LAB — GLUCOSE, PLEURAL OR PERITONEAL FLUID: Glucose, Fluid: 176 mg/dL

## 2022-02-02 LAB — GRAM STAIN

## 2022-02-02 LAB — PROTEIN, PLEURAL OR PERITONEAL FLUID: Total protein, fluid: <3 g/dL

## 2022-02-02 LAB — COOXEMETRY PANEL
Carboxyhemoglobin: 1.5 % (ref 0.5–1.5)
Carboxyhemoglobin: 1.7 % — ABNORMAL HIGH (ref 0.5–1.5)
Methemoglobin: 1 % (ref 0.0–1.5)
Methemoglobin: <0.7 % (ref 0.0–1.5)
O2 Saturation: 68.2 %
O2 Saturation: 74 %
Total hemoglobin: 10.2 g/dL — ABNORMAL LOW (ref 12.0–16.0)
Total hemoglobin: 8.8 g/dL — ABNORMAL LOW (ref 12.0–16.0)

## 2022-02-02 LAB — MAGNESIUM: Magnesium: 1.8 mg/dL (ref 1.7–2.4)

## 2022-02-02 MED ORDER — FUROSEMIDE 10 MG/ML IJ SOLN
40.0000 mg | Freq: Once | INTRAMUSCULAR | Status: AC
  Administered 2022-02-02: 40 mg via INTRAVENOUS
  Filled 2022-02-02: qty 4

## 2022-02-02 MED ORDER — ETOMIDATE 2 MG/ML IV SOLN
20.0000 mg | Freq: Once | INTRAVENOUS | Status: AC
  Administered 2022-02-02: 20 mg via INTRAVENOUS

## 2022-02-02 MED ORDER — PEPTAMEN 1.5 CAL PO LIQD
1000.0000 mL | ORAL | Status: DC
  Administered 2022-02-03 – 2022-02-08 (×4): 1000 mL
  Filled 2022-02-02 (×9): qty 1000

## 2022-02-02 MED ORDER — FUROSEMIDE 10 MG/ML IJ SOLN: 40.0000 mg | Freq: Once | INTRAMUSCULAR | Status: DC

## 2022-02-02 MED ORDER — SODIUM CHLORIDE 0.9 % IV SOLN
20.0000 ug | Freq: Once | INTRAVENOUS | Status: AC
  Administered 2022-02-02: 20 ug via INTRAVENOUS
  Filled 2022-02-02: qty 5

## 2022-02-02 MED ORDER — INSULIN ASPART 100 UNIT/ML IJ SOLN
0.0000 [IU] | INTRAMUSCULAR | Status: DC
  Administered 2022-02-02: 1 [IU] via SUBCUTANEOUS
  Administered 2022-02-02: 2 [IU] via SUBCUTANEOUS
  Administered 2022-02-02 (×2): 1 [IU] via SUBCUTANEOUS
  Administered 2022-02-03: 3 [IU] via SUBCUTANEOUS

## 2022-02-02 MED ORDER — METOCLOPRAMIDE HCL 5 MG/ML IJ SOLN
5.0000 mg | Freq: Three times a day (TID) | INTRAMUSCULAR | Status: DC
  Administered 2022-02-02 – 2022-02-04 (×7): 5 mg via INTRAVENOUS
  Filled 2022-02-02 (×7): qty 2

## 2022-02-02 MED ORDER — DOCUSATE SODIUM 50 MG/5ML PO LIQD
100.0000 mg | Freq: Two times a day (BID) | ORAL | Status: DC
  Administered 2022-02-02 – 2022-02-18 (×19): 100 mg
  Filled 2022-02-02 (×23): qty 10

## 2022-02-02 MED ORDER — POLYETHYLENE GLYCOL 3350 17 G PO PACK
17.0000 g | PACK | Freq: Every day | ORAL | Status: DC
  Administered 2022-02-02 – 2022-02-18 (×11): 17 g
  Filled 2022-02-02 (×12): qty 1

## 2022-02-02 MED ORDER — MAGNESIUM SULFATE 2 GM/50ML IV SOLN
2.0000 g | Freq: Once | INTRAVENOUS | Status: AC
  Administered 2022-02-02: 2 g via INTRAVENOUS
  Filled 2022-02-02: qty 50

## 2022-02-02 MED ORDER — DEXMEDETOMIDINE HCL IN NACL 400 MCG/100ML IV SOLN
0.4000 ug/kg/h | INTRAVENOUS | Status: DC
  Administered 2022-02-02: 0.4 ug/kg/h via INTRAVENOUS
  Administered 2022-02-02: 0.9 ug/kg/h via INTRAVENOUS
  Administered 2022-02-03 (×2): 1 ug/kg/h via INTRAVENOUS
  Administered 2022-02-04 (×2): 0.9 ug/kg/h via INTRAVENOUS
  Administered 2022-02-05 – 2022-02-06 (×4): 1 ug/kg/h via INTRAVENOUS
  Administered 2022-02-06: 0.6 ug/kg/h via INTRAVENOUS
  Administered 2022-02-07: 0.8 ug/kg/h via INTRAVENOUS
  Administered 2022-02-07: 0.6 ug/kg/h via INTRAVENOUS
  Administered 2022-02-07: 1 ug/kg/h via INTRAVENOUS
  Filled 2022-02-02 (×8): qty 100
  Filled 2022-02-02: qty 200
  Filled 2022-02-02 (×4): qty 100

## 2022-02-02 MED ORDER — POTASSIUM CHLORIDE 10 MEQ/50ML IV SOLN
10.0000 meq | INTRAVENOUS | Status: AC
  Administered 2022-02-02 (×4): 10 meq via INTRAVENOUS
  Filled 2022-02-02 (×4): qty 50

## 2022-02-02 MED ORDER — SODIUM CHLORIDE 0.9% FLUSH
10.0000 mL | Freq: Three times a day (TID) | INTRAVENOUS | Status: DC
  Administered 2022-02-02 – 2022-02-08 (×18): 10 mL via INTRAPLEURAL

## 2022-02-02 MED ORDER — POTASSIUM CHLORIDE 20 MEQ PO PACK
20.0000 meq | PACK | Freq: Once | ORAL | Status: AC
  Administered 2022-02-02: 20 meq
  Filled 2022-02-02: qty 1

## 2022-02-02 NOTE — Inpatient Diabetes Management (Signed)
Inpatient Diabetes Program Recommendations  AACE/ADA: New Consensus Statement on Inpatient Glycemic Control (2015)  Target Ranges:  Prepandial:   less than 140 mg/dL      Peak postprandial:   less than 180 mg/dL (1-2 hours)      Critically ill patients:  140 - 180 mg/dL    Latest Reference Range & Units 01/31/22 23:29 02/01/22 04:17 02/01/22 07:58 02/01/22 11:42 02/01/22 16:39 02/01/22 19:50  Glucose-Capillary 70 - 99 mg/dL 263 (H)  3 units Novolog  91 <10 (LL) 192 (H) 222 (H)  3 units Novolog  128 (H)  1 unit Novolog  93  (LL): Data is critically low (H): Data is abnormally high  Latest Reference Range & Units 02/01/22 23:33 02/02/22 03:24  Glucose-Capillary 70 - 99 mg/dL 785 (H) 885 (H)  (H): Data is abnormally high    Current Orders: Novolog Sensitive Correction Scale/ SSI (0-9 units) TID AC    MD- Note Tube Feeds are on Hold until seen this AM.  Getting Solucortef 100 mg BID.    Currently on Vent.  Please consider changing the Novolog Correction Scale (SSI) to the Very Sensitive scale (0-6 units) Q4 hours     --Will follow patient during hospitalization--  Ambrose Finland RN, MSN, CDCES Diabetes Coordinator Inpatient Glycemic Control Team Team Pager: [phone] (8a-5p)

## 2022-02-02 NOTE — Procedures (Signed)
Insertion of Chest Tube Procedure Note  Nicole Cunningham  [id number]  03/10/1961  Date:02/02/22  Time:1:20 PM    Provider Performing: Lorin Glass   Procedure: Pleural Catheter Insertion w/ Imaging Guidance ([id number])  Indication(s) Effusion  Consent Risks of the procedure as well as the alternatives and risks of each were explained to the patient and/or caregiver.  Consent for the procedure was obtained and is signed in the bedside chart  Anesthesia Topical only with 1% lidocaine    Time Out Verified patient identification, verified procedure, site/side was marked, verified correct patient position, special equipment/implants available, medications/allergies/relevant history reviewed, required imaging and test results available.   Sterile Technique Maximal sterile technique including full sterile barrier drape, hand hygiene, sterile gown, sterile gloves, mask, hair covering, sterile ultrasound probe cover (if used).   Procedure Description Ultrasound used to identify appropriate pleural anatomy for placement and overlying skin marked. Area of placement cleaned and draped in sterile fashion.  A 14 French pigtail pleural catheter was placed into the left pleural space using Seldinger technique. Appropriate return of fluid was obtained.  The tube was connected to atrium and placed on -20 cm H2O wall suction.   Complications/Tolerance None; patient tolerated the procedure well. Chest X-ray is ordered to verify placement.   EBL Minimal  Specimen(s) fluid

## 2022-02-02 NOTE — Progress Notes (Signed)
eLink Physician-Brief Progress Note Patient Name: Nicole Cunningham DOB: 1960/07/30 MRN: [id number]   Date of Service  02/02/2022  HPI/Events of Note  Review of CXR and Abdominal film reveal: 1. Stable interstitial and airspace opacities in the lungs bilaterally, possible edema or pneumonitis. 2. Small left pleural effusion. 3. OGT terminates in satisfactory position in the stomach. Patient currently on Cefzolin for E. Coli bacteremia.   eICU Interventions  Plan: OGT to LIS.     Intervention Category Major Interventions: Other:  Parv Manthey Dennard Nip 02/02/2022, 5:15 AM

## 2022-02-02 NOTE — Progress Notes (Signed)
Nutrition Follow-up  DOCUMENTATION CODES:   Underweight, Severe malnutrition in context of chronic illness  INTERVENTION:   Resume tube feeds via post-pyloric Cortrak: - Start Peptamen 1.5 @ 20 ml/hr and advance by 10 ml q 6 hours to goal rate of 50 ml/hr (1200 ml/day)  Tube feeding regimen at goal rate provides 1800 kcal, 82 grams of protein, and 922 ml of H2O.  NUTRITION DIAGNOSIS:   Severe Malnutrition related to chronic illness (COPD) as evidenced by severe muscle depletion, severe fat depletion.  Ongoing, being addressed via resuming TF  GOAL:   Patient will meet greater than or equal to 90% of their needs  Met via TF at goal rate  MONITOR:   Vent status, Labs, Weight trends, TF tolerance, I & O's  REASON FOR ASSESSMENT:   Consult Enteral/tube feeding initiation and management (trickle tube feeds)  ASSESSMENT:   61 year old female who presented to the ED on 11/05 with AMS. PMH of COPD, anemia, HTN. Pt admitted with septic shock, AKI.  11/06 - intubation 11/07 - trickle TF started 11/08 - TF advanced to goal, Cortrak placed (tip gastric) 11/09 - TF held due to vomiting, OG tube placed to LIWS 11/10 - Cortrak advanced post-pyloric (tip "probably transpyloric in the proximal jejunum")  Discussed pt with RN and during ICU rounds. Pt off levophed and vasopressin, remains on dobutamine. Cortrak advanced post-pyloric today. PCCM MD okay with using Cortrak to resume tube feeds at trickle rate and advance to goal. OG tube in proximal stomach to LIWS. IV reglan has been added.  Admit weight: 40.8 kg Current weight: 47.6 kg  Patient remains intubated on ventilator support MV: 10.1 L/min Temp (24hrs), Avg:98.9 F (37.2 C), Min:98.5 F (36.9 C), Max:99.4 F (37.4 C)  Drips: Precedex Fentanyl Dobutamine  Medications reviewed and include: colace, IV solu-cortef, SSI q 4 hours, IV reglan 5 mg q 8 hours, IV protonix, miralax, klor-con 20 mEq once, IV abx, IV magnesium  sulfate 2 gram once, IV KCl 10 mEq x 4 runs  Labs reviewed: sodium 146, potassium 2.8, BUN 56, creatinine 2.08, hemoglobin 9.6, platelets 19 CBG's: 93-222 x 24 hours  UOP: 1080 ml x 24 hours I/O's: +11.2 L since admit  Diet Order:   Diet Order             Diet NPO time specified  Diet effective now                   EDUCATION NEEDS:   Not appropriate for education at this time  Skin:  Skin Assessment: Reviewed RN Assessment (skin tear to perineum)  Last BM:  01/31/22 small type 6  Height:   Ht Readings from Last 1 Encounters:  01/28/22 _0  (1.626 m)    Weight:   Wt Readings from Last 1 Encounters:  02/02/22 47.6 kg    Ideal Body Weight:  54.5 kg  BMI:  Body mass index is 18.01 kg/m.  Estimated Nutritional Needs:   Kcal:  1650-1850  Protein:  70-85 grams  Fluid:  1.6-1.8 L    Gustavus Bryant, MS, RD, LDN Inpatient Clinical Dietitian Please see AMiON for contact information.

## 2022-02-02 NOTE — Progress Notes (Signed)
Pharmacy Electrolyte Replacement  Recent Labs:  Recent Labs    02/02/22 0313  K 2.8*  MG 1.8  PHOS 3.3  CREATININE 2.08*    Low Values Present: K = 2.8  Plan:   KCL 10 meq/50 ml x 4   Jeanella Cara, PharmD, [facility] Clinical Pharmacist Please see AMION for all Pharmacists' Contact Phone Numbers 02/02/2022, 7:27 AM

## 2022-02-02 NOTE — Procedures (Signed)
Cortrak  Tube Type:  Cortrak - 43 inches Tube Location:  Left nare Initial Placement:  Postpyloric Secured by: Bridle Technique Used to Measure Tube Placement:  Marking at nare/corner of mouth Cortrak Secured At:  94 cm   Cortrak Tube Team Note:  Consult received to place a Cortrak feeding tube.   X-ray is required, abdominal x-ray has been ordered by the Cortrak team. Please confirm tube placement before using the Cortrak tube.   If the tube becomes dislodged please keep the tube and contact the Cortrak team at [email or web address] for replacement.  If after hours and replacement cannot be delayed, place a NG tube and confirm placement with an abdominal x-ray.    Betsey Holiday MS, RD, LDN Please refer to [facility] for RD and/or RD on-call/weekend/after hours pager

## 2022-02-02 NOTE — Progress Notes (Signed)
eLink Physician-Brief Progress Note Patient Name: EDIN SKARDA DOB: October 07, 1960 MRN: [id number]   Date of Service  02/02/2022  HPI/Events of Note  Patient vomited again.   eICU Interventions  Plan: OGT to LIS, Portable abdominal film post OGT placement.      Intervention Category Major Interventions: Other:  Yomar Mejorado Dennard Nip 02/02/2022, 2:05 AM

## 2022-02-02 NOTE — Progress Notes (Signed)
NAME:  Nicole Cunningham, MRN:  [id number], DOB:  November 22, 1960, LOS: 4 ADMISSION DATE:  01/28/2022, CONSULTATION DATE:  11/6 REFERRING MD:  Dr. Sedonia Small, CHIEF COMPLAINT:  septic shock   History of Present Illness:  Patient is a 61 year old female with pertinent PMH of COPD, anemia, HTN presents to [facility] ED on 11/6 with AMS.  Patient recently admitted to [facility] with AMS on 11/2021.  Found to have UTI and pyelonephritis.  On 11/5, patient having increased confusion.  Having some SOB over the past couple of days.  Brought to [facility] ED for further eval.  Upon arrival to [facility] ED, patient confused but able to state name.  Admitted with shock, AKI, lactic acidosis of 9.0  Pertinent ED labs: CO2 14, BUN 51, creat 4.13, AST 52, alk phos 159, AG 23, WBC 1.3, Hgb 9.3, platelets 70, troponin 64 then 80.  CT head without acute abnormality.  CXR no significant findings.  CT abdomen pelvis 17 mm nonobstructive renal calculus without hydronephrosis.  Found to have E. coli bacteremia and EF 20 to 25% on echo, mixed cardiogenic and septic shock  Pertinent  Medical History   Past Medical History:  Diagnosis Date   Bronchitis    COPD (chronic obstructive pulmonary disease) ([place])    Hypertension      Significant Hospital Events: Including procedures, antibiotic start and stop dates in addition to other pertinent events   11/6 septic shock on levo , ETT >> 11/6 Echo EF 20 to 25% global hypokinesis, apex intact 11/6 RIJ HD cath >> dobutamine at 5 mics 11/7>>Dobutamine increased to 7.5 mics , 1 unit PRBC and 1 unit platelet transfusion 11/8 dobutamine increased to 10 mics  Interim History / Subjective:  No events. Levo off Vaso off Dobut 7.5 Tachy Not following commands consistently Vomit x 2 overnight  Objective   Blood pressure 104/76, pulse (!) 114, temperature 99.4 F (37.4 C), temperature source Oral, resp. rate 20, height _0  (1.626 m), weight 47.6 kg, SpO2 96 %. CVP:  [2 mmHg-12 mmHg] 3 mmHg  Vent  Mode: PRVC FiO2 (%):  [50 %] 50 % Set Rate:  [20 bmp] 20 bmp Vt Set:  [430 mL] 430 mL PEEP:  [5 cmH20] 5 cmH20 Plateau Pressure:  [15 cmH20-17 cmH20] 15 cmH20   Intake/Output Summary (Last 24 hours) at 02/02/2022 0740 Last data filed at 02/02/2022 0600 Gross per 24 hour  Intake 1221.09 ml  Output 1080 ml  Net 141.09 ml    Filed Weights   01/31/22 0125 02/01/22 0458 02/02/22 0500  Weight: 47.6 kg 47.8 kg 47.6 kg    Examination: Thin chronically ill woman in NAD Stable ischemic changes L fingers Ext lukewarm Copious thick secretions Abd soft hypoactive BS Moves ext to command but requires frequent redirection Very weak Some bloody output from NGT  Weight up 7 kg for admit Coox 68% K low, Cr better Mag repleted Plts better with transfusion WBC better CXR mild edema, L effusion  Resolved Hospital Problem list     Assessment & Plan:  Mixed cardiogenic and septic shock: suspicion for sepsis cardiomyopathy.  E coli UTI.  Planned for [facility] once kidneys settle out.  CVP remains low. E coli bacteremia- did not grow in urine, ?GI source AKI- septic ATN +/- cardiorenal Pancytopenia- sepsis related improving Septic encephalopathy- improving with treatment and sedation wean COPD: On Breo Ellipta Multifocal pna +/- edema L parapneumonic effusion N/V- making stool +staph hominis in urine- given improvement without coverage, will treat this  as contaminant Nonobstructing ureteral stone- doubt contributing to current clinical picture  - Continue ancef, probably will do longer course - Replete K - Recheck Qtc, if okay will do reglan and restart trickle feeds - Await CHF eval of CVP, if up can do some diuretics otherwise may just leave alone - Pigtail to L chest, send usual studies - Send tracheal aspirate - Continue vent support, precedex in preference to fent gtt - OP urology referral - Secretion burden precludes extubation at present - Trend CBC, hold on further  transfusion today  Best Practice (right click and "Reselect all SmartList Selections" daily)   Diet/type: tubefeeds and NPO  DVT prophylaxis: SCD GI prophylaxis: PPI Lines: Central line and yes and it is still needed Foley:  Yes, and it is still needed Code Status:  full code Last date of multidisciplinary goals of care discussion [11/8 updated  spouse Fritz Pickerel ] 37 min cc time independent of procedures  Erskine Emery MD PCCM

## 2022-02-02 NOTE — Progress Notes (Signed)
Patient ID: Nicole Cunningham, female   DOB: 1960/04/10, 61 y.o.   MRN: [id number]     Advanced Heart Failure Rounding Note  PCP-Cardiologist: None   Subjective:    11/6 milrinone stopped. Switched to dobutamine.   Intubated/sedated  She is now on dobutamine 7.5 and off NE and vasopressin. She is on hydrocortisone stress dose.  Stable BP. Co-ox 68% this morning, CVP 10-11 on my read.  Creatinine lower 2.68 => 2.58 => 2.08.   Still with low plts but higher at 19K this morning.   Afebrile, remains on cefazolin for E coli bacteremia.    Objective:   Weight Range: 47.6 kg Body mass index is 18.01 kg/m.   Vital Signs:   Temp:  [98.5 F (36.9 C)-99.4 F (37.4 C)] 98.7 F (37.1 C) (11/10 0800) Pulse Rate:  [104-140] 114 (11/10 0700) Resp:  [16-28] 20 (11/10 0700) BP: (97-143)/(68-99) 104/76 (11/10 0700) SpO2:  [89 %-100 %] 96 % (11/10 0700) FiO2 (%):  [50 %] 50 % (11/10 0400) Weight:  [47.6 kg] 47.6 kg (11/10 0500) Last BM Date :  (01/29/2022)  Weight change: Filed Weights   01/31/22 0125 02/01/22 0458 02/02/22 0500  Weight: 47.6 kg 47.8 kg 47.6 kg    Intake/Output:   Intake/Output Summary (Last 24 hours) at 02/02/2022 0919 Last data filed at 02/02/2022 0839 Gross per 24 hour  Intake 981.61 ml  Output 1100 ml  Net -118.39 ml      Physical Exam    General: vent Neck: JVP about 10, no thyromegaly or thyroid nodule.  Lungs: Decreased BS on left CV: Nondisplaced PMI.  Heart regular S1/S2, no S3/S4, no murmur.  No peripheral edema.   Abdomen: Soft, nontender, no hepatosplenomegaly, no distention.  Skin: Intact without lesions or rashes.  Neurologic: Sedated on vent. Extremities: No clubbing or cyanosis.  HEENT: Normal.    Telemetry   Sinus tachy around 110 (personally reviewed)  EKG    N/A  Labs    CBC Recent Labs    02/01/22 0528 02/02/22 0313  WBC 17.9* 10.3  NEUTROABS 13.6* 8.9*  HGB 10.1* 9.6*  HCT 28.6* 28.1*  MCV 66.2* 68.2*  PLT 11* 19*    Basic Metabolic Panel Recent Labs    58/52/77 0528 02/02/22 0313  NA 143 146*  K 3.1* 2.8*  CL 105 107  CO2 26 26  GLUCOSE 173* 189*  BUN 71* 56*  CREATININE 2.58* 2.08*  CALCIUM 8.0* 7.3*  MG 2.3 1.8  PHOS 2.3* 3.3   Liver Function Tests No results for input(s): "AST", "ALT", "ALKPHOS", "BILITOT", "PROT", "ALBUMIN" in the last 72 hours.  No results for input(s): "LIPASE", "AMYLASE" in the last 72 hours. Cardiac Enzymes No results for input(s): "CKTOTAL", "CKMB", "CKMBINDEX", "TROPONINI" in the last 72 hours.   BNP: BNP (last 3 results) No results for input(s): "BNP" in the last 8760 hours.  ProBNP (last 3 results) No results for input(s): "PROBNP" in the last 8760 hours.   D-Dimer No results for input(s): "DDIMER" in the last 72 hours.  Hemoglobin A1C Recent Labs    02/02/22 0313  HGBA1C 5.4   Fasting Lipid Panel No results for input(s): "CHOL", "HDL", "LDLCALC", "TRIG", "CHOLHDL", "LDLDIRECT" in the last 72 hours. Thyroid Function Tests No results for input(s): "TSH", "T4TOTAL", "T3FREE", "THYROIDAB" in the last 72 hours.  Invalid input(s): "FREET3"   Other results:   Imaging    DG Chest Port 1 View  Result Date: 02/02/2022 CLINICAL DATA:  Acute respiratory failure  and OG tube placement. EXAM: PORTABLE ABDOMEN - 1 VIEW; PORTABLE CHEST - 1 VIEW COMPARISON:  02/01/2022. FINDINGS: The cardiac silhouette and mediastinal contour is stable. There is atherosclerotic calcification of the aorta. And pulmonary vasculature is distended. Interstitial and airspace opacities are noted in the lungs bilaterally, not significantly changed from the prior exam. There is a small left pleural effusion. No pneumothorax. An enteric tube terminates in the stomach. A stable right internal jugular central venous catheter is noted. An endotracheal tube terminates 1.2 cm above the carina. IMPRESSION: 1. Stable interstitial and airspace opacities in the lungs bilaterally, possible  edema or pneumonitis. 2. Small left pleural effusion. 3. Medical devices as described above. Electronically Signed   By: Thornell Sartorius M.D.   On: 02/02/2022 04:53   DG Abd Portable 1V  Result Date: 02/02/2022 CLINICAL DATA:  Acute respiratory failure and OG tube placement. EXAM: PORTABLE ABDOMEN - 1 VIEW; PORTABLE CHEST - 1 VIEW COMPARISON:  02/01/2022. FINDINGS: The cardiac silhouette and mediastinal contour is stable. There is atherosclerotic calcification of the aorta. And pulmonary vasculature is distended. Interstitial and airspace opacities are noted in the lungs bilaterally, not significantly changed from the prior exam. There is a small left pleural effusion. No pneumothorax. An enteric tube terminates in the stomach. A stable right internal jugular central venous catheter is noted. An endotracheal tube terminates 1.2 cm above the carina. IMPRESSION: 1. Stable interstitial and airspace opacities in the lungs bilaterally, possible edema or pneumonitis. 2. Small left pleural effusion. 3. Medical devices as described above. Electronically Signed   By: Thornell Sartorius M.D.   On: 02/02/2022 04:53     Medications:     Scheduled Medications:  arformoterol  15 mcg Nebulization BID   budesonide (PULMICORT) nebulizer solution  0.25 mg Nebulization BID   Chlorhexidine Gluconate Cloth  6 each Topical Q0600   docusate  100 mg Per Tube BID   fentaNYL (SUBLIMAZE) injection  50 mcg Intravenous Once   furosemide  40 mg Intravenous Once   hydrocortisone sod succinate (SOLU-CORTEF) inj  100 mg Intravenous Q12H   insulin aspart  0-6 Units Subcutaneous Q4H   metoCLOPramide (REGLAN) injection  5 mg Intravenous Q8H   mouth rinse  15 mL Mouth Rinse Q2H   pantoprazole (PROTONIX) IV  40 mg Intravenous Q12H   polyethylene glycol  17 g Per Tube Daily   potassium chloride  20 mEq Per Tube Once   sodium chloride flush  10-40 mL Intracatheter Q12H   sodium chloride flush  3 mL Intravenous Q12H    Infusions:   sodium chloride 250 mL (02/01/22 0041)   sodium chloride Stopped (01/29/22 0431)   sodium chloride      ceFAZolin (ANCEF) IV Stopped (02/01/22 2203)   desmopressin (DDAVP) 20 mcg in sodium chloride 0.9 % 50 mL IVPB     dexmedetomidine (PRECEDEX) IV infusion 0.4 mcg/kg/hr (02/02/22 0829)   DOBUTamine 5 mcg/kg/min (02/02/22 0800)   feeding supplement (PEPTAMEN 1.5 CAL)     fentaNYL infusion INTRAVENOUS 130 mcg/hr (02/02/22 0400)   magnesium sulfate bolus IVPB 2 g (02/02/22 0839)   norepinephrine (LEVOPHED) Adult infusion Stopped (02/01/22 1927)   potassium chloride 10 mEq (02/02/22 0833)   vasopressin Stopped (01/31/22 1316)    PRN Medications: Place/Maintain arterial line **AND** sodium chloride, sodium chloride, fentaNYL, ipratropium-albuterol, midazolam, mouth rinse, sodium chloride flush, sodium chloride flush    Patient Profile  61 y/o AAF w/ h/o HTN, COPD, anemia, h/o poor med compliance and prior admission to  [facility] 9/23 for UTI and pyelonephritis, admitted w/ mixed septic and cardiogenic shock.    Assessment/Plan   1. Shock: Suspect mixed septic shock and cardiogenic shock.  Lactate was > 9 with PCT > 150.  E coli bacteremia with echo showing EF 20-25%.  Overall improving and off pressors.  - Cefazolin for E coli bacteremia.   - Stress dose hydrocortisone.  2. Acute systolic CHF: Echo showed EF 20-25% with preservation of apical function but severe hypokinesis of other walls, normal RV, dilated IVC. Unusual wall motion abnormality pattern, prior echo was normal.  ?Septic cardiomyopathy with reverse Takotsubo-type pattern.  HS-TnI was minimally elevated with no trend so doubt ACS. Co-ox 68% this morning.  Creatinine trending down to 2.08.  CVP 10-11. On dobutamine 7.5 and off pressors.  - Decrease dobutamine to 5 and would leave at that dose for the time being.  - Lasix 40 mg IV x 1 and follow response.  Replacing K.     - Would plan for LHC/RHC when stabilized and creatinine/plts  improved to rule out CAD.  3. AKI: In setting of septic shock.  Creatinine 4.13 => 3.5 => 2.9 => 2.68 => 2.58 => 2.08.  Gradual improvement.   - Follow BMET closely.  4. ID: E coli bacteremia.  PCT > 150 initially. WBCs 21.9 => 17.9 => 10.3 today.  CXR with bilateral infiltrates.  Had recent admission with pyelonephritis. Now afebrile.  - On cefazolin.  5. Anemia: Hgb 9.6, not overtly bleeding.  6. Thrombocytopenia: plts still low at 11K => 19K, suspect due to inflammation/sepsis.  - Transfuse plts < 10K 7. Acute hypoxemic respiratory failure: PNA on CXR, also suspect pulmonary edema. Intubated.  Has left pleural effusion.  - For thoracentesis today.    CRITICAL CARE Performed by: Marca Ancona  Total critical care time: 35 minutes  Critical care time was exclusive of separately billable procedures and treating other patients.  Critical care was necessary to treat or prevent imminent or life-threatening deterioration.  Critical care was time spent personally by me on the following activities: development of treatment plan with patient and/or surrogate as well as nursing, discussions with consultants, evaluation of patient's response to treatment, examination of patient, obtaining history from patient or surrogate, ordering and performing treatments and interventions, ordering and review of laboratory studies, ordering and review of radiographic studies, pulse oximetry and re-evaluation of patient's condition.   Length of Stay: 4  Marca Ancona, MD  02/02/2022, 9:19 AM  Advanced Heart Failure Team Pager [phone] (M-F; 7a - 5p)  Please contact [facility] for night-coverage after hours (5p -7a ) and weekends on [email or web address]

## 2022-02-02 NOTE — Plan of Care (Signed)
  Problem: Education: Goal: Knowledge of General Education information will improve Description: Including pain rating scale, medication(s)/side effects and non-pharmacologic comfort measures Outcome: Progressing   Problem: Clinical Measurements: Goal: Diagnostic test results will improve Outcome: Progressing   Problem: Nutrition: Goal: Adequate nutrition will be maintained Outcome: Progressing   Problem: Elimination: Goal: Will not experience complications related to bowel motility Outcome: Progressing Goal: Will not experience complications related to urinary retention Outcome: Progressing   Problem: Pain Managment: Goal: General experience of comfort will improve Outcome: Progressing   Problem: Safety: Goal: Ability to remain free from injury will improve Outcome: Progressing

## 2022-02-03 ENCOUNTER — Inpatient Hospital Stay (HOSPITAL_COMMUNITY): Payer: Self-pay

## 2022-02-03 LAB — COOXEMETRY PANEL
Carboxyhemoglobin: 2.1 % — ABNORMAL HIGH (ref 0.5–1.5)
Methemoglobin: <0.7 % (ref 0.0–1.5)
O2 Saturation: 83.1 %
Total hemoglobin: 8.7 g/dL — ABNORMAL LOW (ref 12.0–16.0)

## 2022-02-03 LAB — CBC
HCT: 24.7 % — ABNORMAL LOW (ref 36.0–46.0)
Hemoglobin: 8.5 g/dL — ABNORMAL LOW (ref 12.0–15.0)
MCH: 23.9 pg — ABNORMAL LOW (ref 26.0–34.0)
MCHC: 34.4 g/dL (ref 30.0–36.0)
MCV: 69.4 fL — ABNORMAL LOW (ref 80.0–100.0)
Platelets: 24 10*3/uL — CL (ref 150–400)
RBC: 3.56 MIL/uL — ABNORMAL LOW (ref 3.87–5.11)
RDW: 29 % — ABNORMAL HIGH (ref 11.5–15.5)
WBC: 14.9 10*3/uL — ABNORMAL HIGH (ref 4.0–10.5)
nRBC: 0.5 % — ABNORMAL HIGH (ref 0.0–0.2)

## 2022-02-03 LAB — GLUCOSE, CAPILLARY
Glucose-Capillary: 258 mg/dL — ABNORMAL HIGH (ref 70–99)
Glucose-Capillary: 157 mg/dL — ABNORMAL HIGH (ref 70–99)
Glucose-Capillary: 163 mg/dL — ABNORMAL HIGH (ref 70–99)
Glucose-Capillary: 197 mg/dL — ABNORMAL HIGH (ref 70–99)
Glucose-Capillary: 202 mg/dL — ABNORMAL HIGH (ref 70–99)
Glucose-Capillary: 198 mg/dL — ABNORMAL HIGH (ref 70–99)
Glucose-Capillary: 178 mg/dL — ABNORMAL HIGH (ref 70–99)

## 2022-02-03 LAB — BASIC METABOLIC PANEL
Anion gap: 8 (ref 5–15)
BUN: 54 mg/dL — ABNORMAL HIGH (ref 8–23)
CO2: 26 mmol/L (ref 22–32)
Calcium: 7 mg/dL — ABNORMAL LOW (ref 8.9–10.3)
Chloride: 112 mmol/L — ABNORMAL HIGH (ref 98–111)
Creatinine, Ser: 2.28 mg/dL — ABNORMAL HIGH (ref 0.44–1.00)
GFR, Estimated: 24 mL/min — ABNORMAL LOW (ref >60)
Glucose, Bld: 290 mg/dL — ABNORMAL HIGH (ref 70–99)
Potassium: 3.3 mmol/L — ABNORMAL LOW (ref 3.5–5.1)
Sodium: 146 mmol/L — ABNORMAL HIGH (ref 135–145)

## 2022-02-03 MED ORDER — INSULIN ASPART 100 UNIT/ML IJ SOLN
10.0000 [IU] | Freq: Once | INTRAMUSCULAR | Status: AC
  Administered 2022-02-03: 10 [IU] via SUBCUTANEOUS

## 2022-02-03 MED ORDER — POTASSIUM CHLORIDE 20 MEQ PO PACK
20.0000 meq | PACK | Freq: Once | ORAL | Status: AC
  Administered 2022-02-03: 20 meq
  Filled 2022-02-03: qty 1

## 2022-02-03 MED ORDER — INSULIN ASPART 100 UNIT/ML IJ SOLN
2.0000 [IU] | INTRAMUSCULAR | Status: DC
  Administered 2022-02-03 – 2022-02-04 (×8): 4 [IU] via SUBCUTANEOUS
  Administered 2022-02-04: 6 [IU] via SUBCUTANEOUS
  Administered 2022-02-04: 4 [IU] via SUBCUTANEOUS
  Administered 2022-02-04: 6 [IU] via SUBCUTANEOUS
  Administered 2022-02-04: 4 [IU] via SUBCUTANEOUS
  Administered 2022-02-05: 2 [IU] via SUBCUTANEOUS
  Administered 2022-02-05: 4 [IU] via SUBCUTANEOUS

## 2022-02-03 NOTE — Care Management (Signed)
Patient tolerated weaning trial today from 1215 until 1540. Placed back on full vent support as charted. FiO2 increased from 40% to 50% due to desaturation.

## 2022-02-03 NOTE — Progress Notes (Signed)
eLink Physician-Brief Progress Note Patient Name: Nicole Cunningham DOB: 05/04/1960 MRN: [id number]   Date of Service  02/03/2022  HPI/Events of Note  Blood sugars > 200 mg / dl on sensitive SSI scale, patient is receiving steroids.  eICU Interventions  SSI scale upgraded to standard scale for better glycemic control while on steroids.        [name] 02/03/2022, 12:35 AM

## 2022-02-03 NOTE — Progress Notes (Signed)
eLink Physician-Brief Progress Note Patient Name: CANDACE BEGUE DOB: 23-Feb-1961 MRN: [id number]   Date of Service  02/03/2022  HPI/Events of Note  K+ 3.3, Cr 2.28  eICU Interventions  KCL 20 meq via enteral tube x 1 ordered.        Thomasene Lot Keyondre Hepburn 02/03/2022, 4:01 AM

## 2022-02-03 NOTE — Progress Notes (Signed)
Patient ID: Nicole Cunningham, female   DOB: July 06, 1960, 61 y.o.   MRN: [id number]     Advanced Heart Failure Rounding Note  PCP-Cardiologist: None   Subjective:    - Remains intubated this AM - 2.1L output over the past 24h.  - Mixed venous 83, likely elevated due to a component of sepsis   Objective:   Weight Range: 46.7 kg Body mass index is 17.67 kg/m.   Vital Signs:   Temp:  [97.7 F (36.5 C)-99.4 F (37.4 C)] 99.4 F (37.4 C) (11/11 1138) Pulse Rate:  [68-135] 91 (11/11 1215) Resp:  [17-36] 20 (11/11 1215) BP: (87-173)/(44-108) 103/60 (11/11 1215) SpO2:  [79 %-100 %] 92 % (11/11 1215) FiO2 (%):  [40 %-50 %] 40 % (11/11 1215) Weight:  [46.7 kg] 46.7 kg (11/11 0431) Last BM Date : 02/02/22  Weight change: Filed Weights   02/01/22 0458 02/02/22 0500 02/03/22 0431  Weight: 47.8 kg 47.6 kg 46.7 kg    Intake/Output:   Intake/Output Summary (Last 24 hours) at 02/03/2022 1447 Last data filed at 02/03/2022 1426 Gross per 24 hour  Intake 1753.13 ml  Output 2065 ml  Net -311.87 ml       Physical Exam    General: vent Neck: JVP8-10 w/ V waves Lungs: Decreased BS on left CV: Nondisplaced PMI.  Heart regular S1/S2, no S3/S4, no murmur.  No peripheral edema.   Abdomen: Soft, nontender, no hepatosplenomegaly, no distention.  Skin: Intact without lesions or rashes.  Neurologic: Sedated on vent. Extremities: No clubbing or cyanosis.  HEENT: Normal.    Telemetry   Sinus tachy around 110 (personally reviewed)  EKG    N/A  Labs    CBC Recent Labs    02/01/22 0528 02/02/22 0313 02/03/22 0129  WBC 17.9* 10.3 14.9*  NEUTROABS 13.6* 8.9*  --   HGB 10.1* 9.6* 8.5*  HCT 28.6* 28.1* 24.7*  MCV 66.2* 68.2* 69.4*  PLT 11* 19* 24*    Basic Metabolic Panel Recent Labs    02/01/22 0528 02/02/22 0313 02/02/22 1349 02/03/22 0129  NA 143 146* 146* 146*  K 3.1* 2.8* 4.3 3.3*  CL 105 107 110 112*  CO2 26 26 25 26   GLUCOSE 173* 189* 200* 290*  BUN 71*  56* 53* 54*  CREATININE 2.58* 2.08* 1.97* 2.28*  CALCIUM 8.0* 7.3* 6.8* 7.0*  MG 2.3 1.8  --   --   PHOS 2.3* 3.3  --   --     Hemoglobin A1C Recent Labs    02/02/22 0313  HGBA1C 5.4    Imaging    DG Chest Port 1 View  Result Date: 02/03/2022 CLINICAL DATA:  Chest tube EXAM: PORTABLE CHEST 1 VIEW COMPARISON:  02/02/2022 FINDINGS: Endotracheal tube, feeding tube, NG tube and left chest tube remain in place, unchanged. Right dialysis catheter is also in stable position. No pneumothorax. Heart is normal size. Bilateral interstitial prominence again noted, right greater than left. Small bilateral effusions suspected. IMPRESSION: Stable support devices.  No pneumothorax. Stable interstitial prominence, right greater than left with small effusions. Electronically Signed   By: Rolm Baptise M.D.   On: 02/03/2022 08:10     Medications:     Scheduled Medications:  arformoterol  15 mcg Nebulization BID   budesonide (PULMICORT) nebulizer solution  0.25 mg Nebulization BID   Chlorhexidine Gluconate Cloth  6 each Topical Q0600   docusate  100 mg Per Tube BID   fentaNYL (SUBLIMAZE) injection  50 mcg Intravenous Once  insulin aspart  2-6 Units Subcutaneous Q4H   metoCLOPramide (REGLAN) injection  5 mg Intravenous Q8H   mouth rinse  15 mL Mouth Rinse Q2H   pantoprazole (PROTONIX) IV  40 mg Intravenous Q12H   polyethylene glycol  17 g Per Tube Daily   sodium chloride flush  10 mL Intrapleural Q8H   sodium chloride flush  10-40 mL Intracatheter Q12H   sodium chloride flush  3 mL Intravenous Q12H    Infusions:  sodium chloride 250 mL (02/01/22 0041)   sodium chloride Stopped (01/29/22 0431)   sodium chloride      ceFAZolin (ANCEF) IV Stopped (02/03/22 0949)   dexmedetomidine (PRECEDEX) IV infusion 0.1 mcg/kg/hr (02/03/22 1000)   DOBUTamine 5 mcg/kg/min (02/03/22 1000)   feeding supplement (PEPTAMEN 1.5 CAL) 35 mL/hr at 02/03/22 1000   fentaNYL infusion INTRAVENOUS 300 mcg/hr (02/03/22  1424)   norepinephrine (LEVOPHED) Adult infusion 2 mcg/min (02/03/22 1000)    PRN Medications: Place/Maintain arterial line **AND** sodium chloride, sodium chloride, fentaNYL, ipratropium-albuterol, mouth rinse, sodium chloride flush, sodium chloride flush    Patient Profile  61 y/o AAF w/ h/o HTN, COPD, anemia, h/o poor med compliance and prior admission to [facility] 9/23 for UTI and pyelonephritis, admitted w/ mixed septic and cardiogenic shock.    Assessment/Plan   1. Shock: Suspect mixed septic shock and cardiogenic shock.  Lactate was > 9 with PCT > 150.  E coli bacteremia with echo showing EF 20-25%.  On dobutamine 8mcg/kg/min today, weaning levophed off.  - Cefazolin for E coli bacteremia.   - Stress dose hydrocortisone.  2. Acute systolic CHF: Echo showed EF 20-25% with preservation of apical function but severe hypokinesis of other walls, normal RV, dilated IVC. Unusual wall motion abnormality pattern, prior echo was normal.  ?Septic cardiomyopathy with reverse Takotsubo-type pattern.  HS-TnI was minimally elevated with no trend so doubt ACS. Co-ox 68% this morning.   - Continue dobutamine at 65mcg/kg/min, mixed venous of 83 this AM. Will start weaning off tomorrow if renal function improves. - Hold further diuresis; no peripheral edema on exam, JVP not significantly elevated. Rise in sCr after diuresis yesterday. - Would plan for LHC/RHC when stabilized and creatinine/plts improved to rule out CAD.  3. AKI: In setting of septic shock.  Rising sCr today, hold diuretics. 4. ID: E coli bacteremia.  PCT > 150 initially. WBCs 21.9 => 17.9 => 10.3, now 14 today.   - On cefazolin; may need to escalate if wbc continues to worsen.  5. Anemia: stable 6. Thrombocytopenia: 24k today. Possibly secondary to septic and cardiogenic shock. Will continue to monitor. - Transfuse plts < 10K 7. Acute hypoxemic respiratory failure: PNA on CXR, also suspect pulmonary edema. Intubated.  Has left pleural  effusion.    CRITICAL CARE Performed by: Dorthula Nettles  Total critical care time: 33 minutes  Critical care time was exclusive of separately billable procedures and treating other patients.  Critical care was necessary to treat or prevent imminent or life-threatening deterioration.  Critical care was time spent personally by me on the following activities: development of treatment plan with patient and/or surrogate as well as nursing, discussions with consultants, evaluation of patient's response to treatment, examination of patient, obtaining history from patient or surrogate, ordering and performing treatments and interventions, ordering and review of laboratory studies, ordering and review of radiographic studies, pulse oximetry and re-evaluation of patient's condition.   Length of Stay: 5  Nicole Hettinger, DO  02/03/2022, 2:47 PM  Advanced Heart Failure  Team Pager [phone] (M-F; 7a - 5p)  Please contact [facility] for night-coverage after hours (5p -7a ) and weekends on [email or web address]

## 2022-02-03 NOTE — Progress Notes (Signed)
 NAME:  Nicole Cunningham, MRN:  [id number], DOB:  06/13/1960, LOS: 5 ADMISSION DATE:  01/28/2022, CONSULTATION DATE:  11/6 REFERRING MD:  Dr. Bero, CHIEF COMPLAINT:  septic shock   History of Present Illness:  Patient is a 61-year-old female with pertinent PMH of COPD, anemia, HTN presents to [facility] ED on 11/6 with AMS.  Patient recently admitted to [facility] with AMS on 11/2021.  Found to have UTI and pyelonephritis.  On 11/5, patient having increased confusion.  Having some SOB over the past couple of days.  Brought to [facility] ED for further eval.  Upon arrival to [facility] ED, patient confused but able to state name.  Admitted with shock, AKI, lactic acidosis of 9.0  Pertinent ED labs: CO2 14, BUN 51, creat 4.13, AST 52, alk phos 159, AG 23, WBC 1.3, Hgb 9.3, platelets 70, troponin 64 then 80.  CT head without acute abnormality.  CXR no significant findings.  CT abdomen pelvis 17 mm nonobstructive renal calculus without hydronephrosis.  Found to have E. coli bacteremia and EF 20 to 25% on echo, mixed cardiogenic and septic shock  Pertinent  Medical History   Past Medical History:  Diagnosis Date   Bronchitis    COPD (chronic obstructive pulmonary disease) (HCC)    Hypertension      Significant Hospital Events: Including procedures, antibiotic start and stop dates in addition to other pertinent events   11/6 septic shock on levo , ETT >> 11/6 Echo EF 20 to 25% global hypokinesis, apex intact 11/6 RIJ HD cath >> dobutamine at 5 mics 11/7>>Dobutamine increased to 7.5 mics , 1 unit PRBC and 1 unit platelet transfusion 11/8 dobutamine increased to 10 mics 11/10 dobut wean, diuretic, pigtail  Interim History / Subjective:  Low dose levophed Had some diuresis  Objective   Blood pressure 127/78, pulse (!) 125, temperature 97.8 F (36.6 C), temperature source Axillary, resp. rate 17, height 5' 4" (1.626 m), weight 46.7 kg, SpO2 99 %. CVP:  [1 mmHg-10 mmHg] 9 mmHg  Vent Mode: PRVC FiO2 (%):  [40 %]  40 % Set Rate:  [20 bmp] 20 bmp Vt Set:  [430 mL] 430 mL PEEP:  [5 cmH20] 5 cmH20 Plateau Pressure:  [17 cmH20-18 cmH20] 18 cmH20   Intake/Output Summary (Last 24 hours) at 02/03/2022 0829 Last data filed at 02/03/2022 0600 Gross per 24 hour  Intake 1415.39 ml  Output 2165 ml  Net -749.61 ml    Filed Weights   02/01/22 0458 02/02/22 0500 02/03/22 0431  Weight: 47.8 kg 47.6 kg 46.7 kg    Examination: Thin chronically ill woman in NAD Stable ischemic changes L fingers Ext warm Secretions slightly better today Abd soft hypoactive BS More sedated this am Very weak L pigtail straw fluid, no air leak  Coox 83% on dobut 5 Cr up a bit K repleted Plts a bit better WBC up slightly H/H down a point  Resolved Hospital Problem list     Assessment & Plan:  Mixed cardiogenic and septic shock: suspicion for sepsis cardiomyopathy.  E coli UTI.  Improving SvO2 and pressor needs. E coli bacteremia- did not grow in urine, ?GI source AKI- septic ATN +/- cardiorenal; slightly worse after diuretic trial; she does not look that volume overloaded from edema standpoint but CXR looks wet Pancytopenia- sepsis related improving Septic encephalopathy- improving with treatment and sedation wean COPD: On Breo Ellipta Multifocal pna +/- edema L parapneumonic effusion- pigtail in place N/V- making stool +staph hominis in urine- given   improvement without coverage, will treat this as contaminant Nonobstructing ureteral stone- doubt contributing to current clinical picture  - Continue ancef, duration TBD - Replete K - Continue reglan, advance TF to goal - Inotropes, diuresis, ?need for L/R HC per CHF team - Continue pigtail for now - f/u  tracheal aspirate - Continue vent support, precedex in preference to fent gtt - OP urology referral - Lung protective tidal volumes limiting driving pressures to < 15cm H2O as able - Sedation titrated to vent compliance and patient comfort using PAD  orderset - VAP prevention bundle - Daily SAT/SBT when meets institutional criteria:m secretion burden and weakness precludes extubation at present - Trend CBC  Best Practice (right click and "Reselect all SmartList Selections" daily)   Diet/type: tubefeeds and NPO  DVT prophylaxis: SCD, can consider ppx once plts > 40k GI prophylaxis: PPI Lines: Central line and yes and it is still needed Foley:  Yes, and it is still needed Code Status:  full code Last date of multidisciplinary goals of care discussion [11/10 updated spouse Larry]  32 min cc time independent of procedures  Erskine Emery MD PCCM

## 2022-02-03 NOTE — Progress Notes (Signed)
eLink Physician-Brief Progress Note Patient Name: Nicole Cunningham DOB: January 27, 1961 MRN: [id number]   Date of Service  02/03/2022  HPI/Events of Note  Blood sugar 258 mg / dl.  eICU Interventions  Novalog 10 units sq x 1 ordered.        Thomasene Lot [name] 02/03/2022, 1:59 AM

## 2022-02-04 ENCOUNTER — Inpatient Hospital Stay (HOSPITAL_COMMUNITY): Payer: Self-pay

## 2022-02-04 DIAGNOSIS — I998 Other disorder of circulatory system: Secondary | ICD-10-CM

## 2022-02-04 DIAGNOSIS — A419 Sepsis, unspecified organism: Secondary | ICD-10-CM

## 2022-02-04 DIAGNOSIS — R208 Other disturbances of skin sensation: Secondary | ICD-10-CM

## 2022-02-04 DIAGNOSIS — R6521 Severe sepsis with septic shock: Secondary | ICD-10-CM

## 2022-02-04 LAB — GLUCOSE, CAPILLARY
Glucose-Capillary: 217 mg/dL — ABNORMAL HIGH (ref 70–99)
Glucose-Capillary: 162 mg/dL — ABNORMAL HIGH (ref 70–99)
Glucose-Capillary: 188 mg/dL — ABNORMAL HIGH (ref 70–99)
Glucose-Capillary: 163 mg/dL — ABNORMAL HIGH (ref 70–99)
Glucose-Capillary: 191 mg/dL — ABNORMAL HIGH (ref 70–99)
Glucose-Capillary: 203 mg/dL — ABNORMAL HIGH (ref 70–99)

## 2022-02-04 LAB — COOXEMETRY PANEL
Carboxyhemoglobin: 2.9 % — ABNORMAL HIGH (ref 0.5–1.5)
Carboxyhemoglobin: 1.7 % — ABNORMAL HIGH (ref 0.5–1.5)
Methemoglobin: <0.7 % (ref 0.0–1.5)
Methemoglobin: 0.9 % (ref 0.0–1.5)
O2 Saturation: 98.7 %
O2 Saturation: 77.9 %
Total hemoglobin: 7.4 g/dL — ABNORMAL LOW (ref 12.0–16.0)
Total hemoglobin: 8.7 g/dL — ABNORMAL LOW (ref 12.0–16.0)

## 2022-02-04 LAB — CBC
HCT: 22.4 % — ABNORMAL LOW (ref 36.0–46.0)
Hemoglobin: 7.5 g/dL — ABNORMAL LOW (ref 12.0–15.0)
MCH: 23.7 pg — ABNORMAL LOW (ref 26.0–34.0)
MCHC: 33.5 g/dL (ref 30.0–36.0)
MCV: 70.9 fL — ABNORMAL LOW (ref 80.0–100.0)
Platelets: 28 10*3/uL — CL (ref 150–400)
RBC: 3.16 MIL/uL — ABNORMAL LOW (ref 3.87–5.11)
RDW: 29.6 % — ABNORMAL HIGH (ref 11.5–15.5)
WBC: 12.5 10*3/uL — ABNORMAL HIGH (ref 4.0–10.5)
nRBC: 0.3 % — ABNORMAL HIGH (ref 0.0–0.2)

## 2022-02-04 LAB — BASIC METABOLIC PANEL
Anion gap: 8 (ref 5–15)
BUN: 51 mg/dL — ABNORMAL HIGH (ref 8–23)
CO2: 24 mmol/L (ref 22–32)
Calcium: 7.3 mg/dL — ABNORMAL LOW (ref 8.9–10.3)
Chloride: 116 mmol/L — ABNORMAL HIGH (ref 98–111)
Creatinine, Ser: 2.02 mg/dL — ABNORMAL HIGH (ref 0.44–1.00)
GFR, Estimated: 27 mL/min — ABNORMAL LOW (ref >60)
Glucose, Bld: 226 mg/dL — ABNORMAL HIGH (ref 70–99)
Potassium: 3.9 mmol/L (ref 3.5–5.1)
Sodium: 148 mmol/L — ABNORMAL HIGH (ref 135–145)

## 2022-02-04 LAB — TRIGLYCERIDES, BODY FLUIDS: Triglycerides, Fluid: 16 mg/dL

## 2022-02-04 LAB — IRON AND TIBC
Iron: 10 ug/dL — ABNORMAL LOW (ref 28–170)
Saturation Ratios: 6 % — ABNORMAL LOW (ref 10.4–31.8)
TIBC: 162 ug/dL — ABNORMAL LOW (ref 250–450)
UIBC: 152 ug/dL

## 2022-02-04 LAB — FERRITIN: Ferritin: 158 ng/mL (ref 11–307)

## 2022-02-04 MED ORDER — POTASSIUM CHLORIDE 10 MEQ/50ML IV SOLN: 10.0000 meq | INTRAVENOUS | Status: DC

## 2022-02-04 MED ORDER — ACETAMINOPHEN 325 MG PO TABS
650.0000 mg | ORAL_TABLET | Freq: Four times a day (QID) | ORAL | Status: DC | PRN
  Administered 2022-02-05 – 2022-02-11 (×6): 650 mg
  Filled 2022-02-04 (×7): qty 2

## 2022-02-04 MED ORDER — SODIUM CHLORIDE 0.9 % IV SOLN
1.0000 g | Freq: Two times a day (BID) | INTRAVENOUS | Status: AC
  Administered 2022-02-04 – 2022-02-13 (×20): 1 g via INTRAVENOUS
  Filled 2022-02-04 (×20): qty 20

## 2022-02-04 MED ORDER — POTASSIUM CHLORIDE 20 MEQ PO PACK
20.0000 meq | PACK | Freq: Once | ORAL | Status: AC
  Administered 2022-02-04: 20 meq
  Filled 2022-02-04: qty 1

## 2022-02-04 NOTE — Progress Notes (Signed)
Patient ID: Nicole Cunningham, female   DOB: 14-Sep-1960, 61 y.o.   MRN: [id number]     Advanced Heart Failure Rounding Note  PCP-Cardiologist: None   Subjective:    Remains intubated. On DBA 5. NE now off  Co-ox 99% (inaccurate).  Off diuretics. CVP 8-9. No BMET this am   Awake but not following commands. MAPs labile. Tachy.    Objective:   Weight Range: 48.2 kg Body mass index is 18.24 kg/m.   Vital Signs:   Temp:  [98.5 F (36.9 C)-101.9 F (38.8 C)] 101.6 F (38.7 C) (11/12 0700) Pulse Rate:  [80-146] 116 (11/12 0800) Resp:  [13-34] 20 (11/12 0800) BP: (89-186)/(47-108) 142/75 (11/12 0800) SpO2:  [76 %-100 %] 96 % (11/12 0800) FiO2 (%):  [40 %-60 %] 60 % (11/12 0800) Weight:  [48.2 kg] 48.2 kg (11/12 0448) Last BM Date : 02/02/22  Weight change: Filed Weights   02/02/22 0500 02/03/22 0431 02/04/22 0448  Weight: 47.6 kg 46.7 kg 48.2 kg    Intake/Output:   Intake/Output Summary (Last 24 hours) at 02/04/2022 1012 Last data filed at 02/04/2022 0817 Gross per 24 hour  Intake 2339.33 ml  Output 1215 ml  Net 1124.33 ml       Physical Exam    General: Intubated. Awake. Not following commands. Ill-appearing HEENT: normal + ETT + cor-trak Neck: RIJ cath supple. JVP 8-9. Carotids 2+ bilat;  Cor: PMI nondisplaced. Regular tachy  No rubs, gallops or murmurs. Lungs: clear Abdomen: soft, nontender, nondistended. No hepatosplenomegaly. No bruits or masses. Good bowel sounds. Extremities: no cyanosis, clubbing, rash, edema RUE cool ischemic. + mittens Neuro: intubated awake. Not following commands   Telemetry   Sinus tach 120-140 Personally reviewed  Labs    CBC Recent Labs    02/02/22 0313 02/03/22 0129 02/04/22 0209  WBC 10.3 14.9* 12.5*  NEUTROABS 8.9*  --   --   HGB 9.6* 8.5* 7.5*  HCT 28.1* 24.7* 22.4*  MCV 68.2* 69.4* 70.9*  PLT 19* 24* 28*    Basic Metabolic Panel Recent Labs    32/95/18 0313 02/02/22 1349 02/03/22 0129  NA 146* 146*  146*  K 2.8* 4.3 3.3*  CL 107 110 112*  CO2 26 25 26   GLUCOSE 189* 200* 290*  BUN 56* 53* 54*  CREATININE 2.08* 1.97* 2.28*  CALCIUM 7.3* 6.8* 7.0*  MG 1.8  --   --   PHOS 3.3  --   --     Hemoglobin A1C Recent Labs    02/02/22 0313  HGBA1C 5.4    Imaging    No results found.   Medications:     Scheduled Medications:  arformoterol  15 mcg Nebulization BID   budesonide (PULMICORT) nebulizer solution  0.25 mg Nebulization BID   Chlorhexidine Gluconate Cloth  6 each Topical Q0600   docusate  100 mg Per Tube BID   fentaNYL (SUBLIMAZE) injection  50 mcg Intravenous Once   insulin aspart  2-6 Units Subcutaneous Q4H   metoCLOPramide (REGLAN) injection  5 mg Intravenous Q8H   mouth rinse  15 mL Mouth Rinse Q2H   pantoprazole (PROTONIX) IV  40 mg Intravenous Q12H   polyethylene glycol  17 g Per Tube Daily   sodium chloride flush  10 mL Intrapleural Q8H   sodium chloride flush  10-40 mL Intracatheter Q12H   sodium chloride flush  3 mL Intravenous Q12H    Infusions:  sodium chloride 250 mL (02/01/22 0041)   sodium chloride Stopped (02/03/22  2325)   sodium chloride      ceFAZolin (ANCEF) IV 2 g (02/04/22 0951)   dexmedetomidine (PRECEDEX) IV infusion 0.8 mcg/kg/hr (02/04/22 0800)   DOBUTamine 5 mcg/kg/min (02/04/22 0800)   feeding supplement (PEPTAMEN 1.5 CAL) 50 mL/hr at 02/04/22 0800   fentaNYL infusion INTRAVENOUS 350 mcg/hr (02/04/22 0800)   norepinephrine (LEVOPHED) Adult infusion Stopped (02/03/22 1037)    PRN Medications: Place/Maintain arterial line **AND** sodium chloride, sodium chloride, fentaNYL, ipratropium-albuterol, mouth rinse, sodium chloride flush, sodium chloride flush    Patient Profile  61 y/o AAF w/ h/o HTN, COPD, anemia, h/o poor med compliance and prior admission to [facility] 9/23 for UTI and pyelonephritis, admitted w/ mixed septic and cardiogenic shock.    Assessment/Plan   1. Shock: Suspect mixed septic shock and cardiogenic shock.  Lactate  was > 9 with PCT > 150.  E coli bacteremia with echo showing EF 20-25%.  Remains on dobutamine 93mcg/kg/min. Off NE. Co-ox inaccurrate (99%) - Cefazolin for E coli bacteremia.   - Stress dose hydrocortisone.  2. Acute systolic CHF -> cardiogenic shock: Echo showed EF 20-25% with preservation of apical function but severe hypokinesis of other walls, normal RV, dilated IVC. Unusual wall motion abnormality pattern, prior echo was normal.  ?Septic cardiomyopathy with reverse Takotsubo-type pattern.  HS-TnI was minimally elevated with no trend so doubt ACS. - - Co-ox 99% this morning.  (Not accurate) Will repeat.  - Remains on DBA 5 If co-ox remains high and MAPs marginal may benefit from switching to NE - Volume status ok. Holding diuretics for now  - Would plan for LHC/RHC if/when stabilized and creatinine/plts improved to rule out CAD.  3. AKI: In setting of septic shock.  Rising sCr yesterday. Repeat today 4. ID: E coli bacteremia.  PCT > 150 initially. WBCs 21.9 => 17.9 => 10.3, now 12.5 today.   - On cefazolin; managed by CCM 5. Anemia: stable 6. Thrombocytopenia: 28k today. Possibly secondary to septic and cardiogenic shock. Will continue to monitor. - Transfuse plts < 10K 7. Acute hypoxemic respiratory failure: PNA on CXR, also suspect pulmonary edema. Intubated.   8. Ischemic RUE - at high risk for infection/auto-amputation  She remains critically ill with MSOF. Agree with plan for Palliative Care involvement.   CRITICAL CARE Performed by: Glori Bickers  Total critical care time: 45 minutes  Critical care time was exclusive of separately billable procedures and treating other patients.  Critical care was necessary to treat or prevent imminent or life-threatening deterioration.  Critical care was time spent personally by me (independent of midlevel providers or residents) on the following activities: development of treatment plan with patient and/or surrogate as well as nursing,  discussions with consultants, evaluation of patient's response to treatment, examination of patient, obtaining history from patient or surrogate, ordering and performing treatments and interventions, ordering and review of laboratory studies, ordering and review of radiographic studies, pulse oximetry and re-evaluation of patient's condition.   Length of Stay: [place], MD  02/04/2022, 10:12 AM  Advanced Heart Failure Team Pager [phone] (M-F; 7a - 5p)  Please contact [facility] for night-coverage after hours (5p -7a ) and weekends on [email or web address]

## 2022-02-04 NOTE — Progress Notes (Signed)
VASCULAR LAB    Bilateral upper extremity arterial duplex has been performed.  See CV proc for preliminary results.   Aylissa Heinemann, RVT 02/04/2022, 11:10 AM

## 2022-02-04 NOTE — Consult Note (Signed)
Hospital Consult    Reason for Consult:  Ischemic bilateral hands Referring Physician: Critical care MRN #:  [id number]  History of Present Illness: This is a 61 y.o. female admitted to the ICU with septic and cardiogenic shock that vascular surgery has been consulted for ischemic changes to bilateral hands.  The hand ischemia was first noticed about 2 to 3 days ago.  She was recently admitted with UTI in September.  Then she was readmitted on November 5 and ultimately found to have E. coli bacteremia with new cardiogenic shock.  She is intubated in the ICU.  Currently on dobutamine at 5.  Has been on multiple pressors in the last several days including high-dose Levophed.  Nurses been able to get brachial Doppler signals but nothing at the wrist or hand for the last several days.  Past Medical History:  Diagnosis Date   Bronchitis    COPD (chronic obstructive pulmonary disease) (HCC)    Hypertension     History reviewed. No pertinent surgical history.  No Active Allergies  Prior to Admission medications   Medication Sig Start Date End Date Taking? Authorizing Provider  albuterol (VENTOLIN HFA) 108 (90 Base) MCG/ACT inhaler Inhale 1-2 puffs into the lungs every 6 (six) hours as needed for wheezing or shortness of breath. 10/12/20  Yes Zonia Kief, Amy J, NP  ferrous sulfate 325 (65 FE) MG tablet Take 1 tablet (325 mg total) by mouth 2 (two) times daily with a meal. Patient taking differently: Take 325 mg by mouth daily with breakfast. 12/09/21  Yes Dorcas Carrow, MD  FLUoxetine (PROZAC) 40 MG capsule Take 2 capsules (80 mg total) by mouth daily. 05/18/21  Yes Nwoko, Uchenna E, PA  fluticasone furoate-vilanterol (BREO ELLIPTA) 200-25 MCG/ACT AEPB inhale 1 puff into the lungs daily 10/12/20  Yes Zonia Kief, Amy J, NP  gabapentin (NEURONTIN) 400 MG capsule Take 1 capsule (400 mg total) by mouth at bedtime. Patient taking differently: Take 400 mg by mouth at bedtime as needed (pain). 05/18/21  Yes  Nwoko, Tommas Olp, PA  losartan-hydrochlorothiazide (HYZAAR) 100-25 MG tablet Take 1 tablet by mouth daily.   Yes [provider]  QUEtiapine (SEROQUEL) 300 MG tablet Take 1 tablet (300 mg total) by mouth at bedtime. 12/09/21  Yes Dorcas Carrow, MD  mirtazapine (REMERON) 30 MG tablet Take 1 tablet (30 mg total) by mouth at bedtime. Patient not taking: Reported on 01/30/2022 05/18/21   Karel Jarvis E, PA  potassium chloride SA (KLOR-CON M) 20 MEQ tablet Take 2 tablets (40 mEq total) by mouth daily for 3 days. 12/09/21 12/14/21  Dorcas Carrow, MD  Ipratropium-Albuterol (COMBIVENT RESPIMAT) 20-100 MCG/ACT AERS respimat Inhale 1 puff into the lungs every 6 (six) hours. Patient not taking: No sig reported 12/15/19 04/22/20  Storm Frisk, MD    Social History   Socioeconomic History   Marital status: Married    Spouse name: Not on file   Number of children: Not on file   Years of education: Not on file   Highest education level: Not on file  Occupational History   Not on file  Tobacco Use   Smoking status: Never   Smokeless tobacco: Never  Vaping Use   Vaping Use: Never used  Substance and Sexual Activity   Alcohol use: Not Currently   Drug use: Never   Sexual activity: Not Currently  Other Topics Concern   Not on file  Social History Narrative   Not on file   Social Determinants of Health  Financial Resource Strain: Not on file  Food Insecurity: Not on file  Transportation Needs: Not on file  Physical Activity: Not on file  Stress: Not on file  Social Connections: Not on file  Intimate Partner Violence: Not on file     Family History  Problem Relation Age of Onset   Diabetes Mother    Diabetes Father     ROS: [x]  Positive   [ ]  Negative   [ ]  All sytems reviewed and are negative  Cardiovascular: []  chest pain/pressure []  palpitations []  SOB lying flat []  DOE []  pain in legs while walking []  pain in legs at rest []  pain in legs at night []   non-healing ulcers []  hx of DVT []  swelling in legs  Pulmonary: []  productive cough []  asthma/wheezing []  home O2  Neurologic: []  weakness in []  arms []  legs []  numbness in []  arms []  legs []  hx of CVA []  mini stroke [] difficulty speaking or slurred speech []  temporary loss of vision in one eye []  dizziness  Hematologic: []  hx of cancer []  bleeding problems []  problems with blood clotting easily  Endocrine:   []  diabetes []  thyroid disease  GI []  vomiting blood []  blood in stool  GU: []  CKD/renal failure []  HD--[]  M/W/F or []  T/T/S []  burning with urination []  blood in urine  Psychiatric: []  anxiety []  depression  Musculoskeletal: []  arthritis []  joint pain  Integumentary: []  rashes []  ulcers  Constitutional: []  fever []  chills   Physical Examination  Vitals:   02/04/22 1000 02/04/22 1100  BP: (!) 162/74 (!) 100/45  Pulse: (!) 138 95  Resp: (!) 31 (!) 27  Temp:    SpO2: 99% 96%   Body mass index is 18.24 kg/m.  General: Intubated critically ill HENT: ET tube Cardiac: Sinus tachycardia Abdomen:  soft, NT/ND Vascular Exam/Pulses: Brisk triphasic Doppler signals in bilateral brachial arteries with no Doppler signals at the wrist Ischemic changes to bilateral hands including all digits as pictured below Musculoskeletal: Appears thin and frail  Neurologic: GCS 6T, opening eyes       CBC    Component Value Date/Time   WBC 12.5 (H) 02/04/2022 0209   RBC 3.16 (L) 02/04/2022 0209   HGB 7.5 (L) 02/04/2022 0209   HCT 22.4 (L) 02/04/2022 0209   PLT 28 (LL) 02/04/2022 0209   MCV 70.9 (L) 02/04/2022 0209   MCH 23.7 (L) 02/04/2022 0209   MCHC 33.5 02/04/2022 0209   RDW 29.6 (H) 02/04/2022 0209   LYMPHSABS 0.6 (L) 02/02/2022 0313   MONOABS 0.6 02/02/2022 0313   EOSABS 0.0 02/02/2022 0313   BASOSABS 0.1 02/02/2022 0313    BMET    Component Value Date/Time   NA 148 (H) 02/04/2022 1133   NA 141 11/07/2021 0934   K 3.9 02/04/2022 1133    CL 116 (H) 02/04/2022 1133   CO2 24 02/04/2022 1133   GLUCOSE 226 (H) 02/04/2022 1133   BUN 51 (H) 02/04/2022 1133   BUN 12 11/07/2021 0934   CREATININE 2.02 (H) 02/04/2022 1133   CALCIUM 7.3 (L) 02/04/2022 1133   CALCIUM 10.4 (H) 12/07/2021 0007   GFRNONAA 27 (L) 02/04/2022 1133   GFRAA >60 11/29/2018 0931    COAGS: Lab Results  Component Value Date   INR 2.2 (H) 01/29/2022   INR 1.8 (H) 01/28/2022   INR 1.1 04/18/2020     Non-Invasive Vascular Imaging:    Bilateral upper extremity arterial duplex reviewed that shows triphasic flow down to the distal  brachial arteries bilaterally.  No evidence of acute thrombus.  No flow was visualized at the wrist or hand in either upper extremity.   ASSESSMENT/PLAN: This is a 61 y.o. female admitted to the ICU with septic and cardiogenic shock after admission for E. coli bacteremia that vascular surgery has been consulted for bilateral upper extremity hand ischemia.  On exam I cannot get any Doppler signals at the wrist.  Arterial duplex also shows no flow at the wrist but she does have triphasic flow down to the distal brachial artery in both upper extremities and no obvious thrombus was visualized.  I suspect this is pressor induced changes from her critical illness.  I discussed with critical care the option of starting her on a heparin drip and warming her upper extremities.  Unfortunately she is not a candidate for heparin due to her profound thrombocytopenia with a platelet count of 28 and dropping hemoglobin.  Vascular surgery will follow but I think she is exceedingly high risk for finger and/or limb loss.  I agree with critical care's plan for palliative care discussion given multisystem organ failure.  Cephus Shelling, MD [facility] Office: [phone]  Cephus Shelling

## 2022-02-04 NOTE — Progress Notes (Addendum)
NAME:  Nicole Cunningham, MRN:  [id number], DOB:  [birth year], LOS: 6 ADMISSION DATE:  01/28/2022, CONSULTATION DATE:  11/6 REFERRING MD:  Dr. Sedonia Small, CHIEF COMPLAINT:  septic shock   History of Present Illness:  Patient is a 61 year old female with pertinent PMH of COPD, anemia, HTN presents to [facility] ED on 11/6 with AMS.  Patient recently admitted to [facility] with AMS on 11/2021.  Found to have UTI and pyelonephritis.  On 11/5, patient having increased confusion.  Having some SOB over the past couple of days.  Brought to [facility] ED for further eval.  Upon arrival to [facility] ED, patient confused but able to state name.  Admitted with shock, AKI, lactic acidosis of 9.0  Pertinent ED labs: CO2 14, BUN 51, creat 4.13, AST 52, alk phos 159, AG 23, WBC 1.3, Hgb 9.3, platelets 70, troponin 64 then 80.  CT head without acute abnormality.  CXR no significant findings.  CT abdomen pelvis 17 mm nonobstructive renal calculus without hydronephrosis.  Found to have E. coli bacteremia and EF 20 to 25% on echo, mixed cardiogenic and septic shock  Pertinent  Medical History   Past Medical History:  Diagnosis Date   Bronchitis    COPD (chronic obstructive pulmonary disease) ([place])    Hypertension      Significant Hospital Events: Including procedures, antibiotic start and stop dates in addition to other pertinent events   11/6 septic shock on levo , ETT >> 11/6 Echo EF 20 to 25% global hypokinesis, apex intact 11/6 RIJ HD cath >> dobutamine at 5 mics 11/7>>Dobutamine increased to 7.5 mics , 1 unit PRBC and 1 unit platelet transfusion 11/8 dobutamine increased to 10 mics 11/10 dobut wean, diuretic, pigtail  Interim History / Subjective:  No events. Made some urine.  Objective   Blood pressure (!) 100/45, pulse 95, temperature (!) 101.6 F (38.7 C), temperature source Oral, resp. rate (!) 27, height _0  (1.626 m), weight 48.2 kg, SpO2 96 %. CVP:  [0 mmHg-8 mmHg] 8 mmHg  Vent Mode: PSV;CPAP FiO2 (%):  [40 %-60  %] 50 % Set Rate:  [20 bmp] 20 bmp Vt Set:  [430 mL] 430 mL PEEP:  [5 cmH20] 5 cmH20 Pressure Support:  [10 cmH20] 10 cmH20 Plateau Pressure:  [17 cmH20-18 cmH20] 18 cmH20   Intake/Output Summary (Last 24 hours) at 02/04/2022 1205 Last data filed at 02/04/2022 1145 Gross per 24 hour  Intake 2594.4 ml  Output 1455 ml  Net 1139.4 ml    Filed Weights   02/02/22 0500 02/03/22 0431 02/04/22 0448  Weight: 47.6 kg 46.7 kg 48.2 kg    Examination: Thin chronically ill woman in NAD R lower arm more demarcated appearing today, no palpable or dopplerable radial/ulnar pulses Lower ext warm Ongoing high secretion burden Abd soft hypoactive BS Very weak L pigtail straw fluid, no air leak  Coox 78% on dobut 5 BMP pending  CBC stable thrombocytopenia  Resolved Hospital Problem list     Assessment & Plan:  Mixed cardiogenic and septic shock: suspicion for sepsis cardiomyopathy.  E coli UTI.  Improving SvO2 and pressor needs. E coli bacteremia- did not grow in urine, ?GI source AKI- septic ATN +/- cardiorenal; slightly worse after diuretic trial; she does not look that volume overloaded from edema standpoint but CXR looks wet Pancytopenia- sepsis related, H/h drifting down Septic encephalopathy- improving with treatment and sedation wean COPD: On Breo Ellipta Multifocal pna +/- edema- enterobacter L parapneumonic effusion- pigtail in place N/V- making  stool, s/p 2 days of reglan +staph hominis in urine- given improvement without coverage, will treat this as contaminant Nonobstructing ureteral stone- doubt contributing to current clinical picture Ischemic upper ext- POA, worse on R, ongoing thrombocytopenia and worsening anemia precludes most interventions, unfortunately may autoamputate  - Broaden abx to meropenem  - Continue OF to suction, postpyloric TF to goal - Inotropes, diuresis, ?need for L/R HC per CHF team - Continue pigtail for now - f/u  tracheal aspirate - Continue  vent support, precedex in preference to fent gtt - OP urology referral - Lung protective tidal volumes limiting driving pressures to < 15cm H2O as able - Sedation titrated to vent compliance and patient comfort using PAD orderset - VAP prevention bundle - Daily SAT/SBT when meets institutional criteria: secretion burden and weakness precludes extubation at present - Trend CBC, continue PPI BID  With her worsening ext ischemia, baseline debility and advanced COPD combined with HCAP/poor cough/ongoing heavy secretion burden, I think she may be nearing end of life or nearing the point where she would not enjoy what little quality she has left.    Will ask PMT to broach this with family and start to think about what destination would be acceptable to [facility] as she is high risk for trach/PEG/LTACH with missing limbs if she survives this hospitalization.  Best Practice (right click and "Reselect all SmartList Selections" daily)   Diet/type: tubefeeds and NPO  DVT prophylaxis: SCD, can consider ppx once plts > 40k GI prophylaxis: PPI Lines: Central line and yes and it is still needed Foley:  Yes, and it is still needed Code Status:  full code Last date of multidisciplinary goals of care discussion [11/10 updated spouse Larry]  34 min cc time independent of procedures  Erskine Emery MD PCCM

## 2022-02-04 NOTE — Progress Notes (Signed)
Pharmacy Antibiotic Note  Nicole Cunningham is a 61 y.o. female admitted on 01/28/2022 with pneumonia.  Pharmacy has been consulted for Meropenem dosing.  CrCl - 22 ml/min  Plan: Meropenem 1 gm IV q12hr Monitor renal function and clinical status  Height: 5\' 4"  (162.6 cm) Weight: 48.2 kg (106 lb 4.2 oz) IBW/kg (Calculated) : 54.7  Temp (24hrs), Avg:100.1 F (37.8 C), Min:98.5 F (36.9 C), Max:101.9 F (38.8 C)  Recent Labs  Lab 01/29/22 1226 01/29/22 1238 01/29/22 2322 01/29/22 2330 01/30/22 0704 01/30/22 1900 01/31/22 0515 01/31/22 0858 02/01/22 0528 02/02/22 0313 02/02/22 1349 02/03/22 0129 02/04/22 0209 02/04/22 1133  WBC  --    < >  --    < >  --    < > 21.4*  --  17.9* 10.3  --  14.9* 12.5*  --   CREATININE  --    < >  --    < >  --    < > 2.68*  --  2.58* 2.08* 1.97* 2.28*  --  2.02*  LATICACIDVEN >9.0*  --  7.8*  --  8.1*  --   --  7.4* 1.9  --   --   --   --   --    < > = values in this interval not displayed.    Estimated Creatinine Clearance: 21.7 mL/min (A) (by C-G formula based on SCr of 2.02 mg/dL (H)).    No Active Allergies  Antimicrobials this admission: CTX 11/6 >> 11/9 Ancef 11/9 >> 11/12 Merrem 11/12 >>  Thank you for allowing pharmacy to be a part of this patient's care.  13/12, PharmD, [facility] Clinical Pharmacist Please see AMION for all Pharmacists' Contact Phone Numbers 02/04/2022, 1:01 PM

## 2022-02-04 NOTE — Progress Notes (Signed)
Pateint received on PRVC settings, as charted. Placed on PSV weaning trial at 0830, 10/5. Tolerated weaning until approx 1345 at which time patient was desaturating and tachycardic. Placed back on full ventilator support.

## 2022-02-04 NOTE — Progress Notes (Signed)
Fingers have been noted to be ischemic since 2-3 days  RN reports no radial pulses, has brachial & post tibial Off pressors, on lower doses of dobutamine No new intervention at this time Rounding team to follow daily Heiley Shaikh V. Vassie Loll MD

## 2022-02-05 DIAGNOSIS — G9341 Metabolic encephalopathy: Secondary | ICD-10-CM

## 2022-02-05 DIAGNOSIS — Z515 Encounter for palliative care: Secondary | ICD-10-CM

## 2022-02-05 DIAGNOSIS — Z7189 Other specified counseling: Secondary | ICD-10-CM

## 2022-02-05 DIAGNOSIS — D61818 Other pancytopenia: Secondary | ICD-10-CM

## 2022-02-05 DIAGNOSIS — J189 Pneumonia, unspecified organism: Secondary | ICD-10-CM

## 2022-02-05 LAB — GLUCOSE, CAPILLARY
Glucose-Capillary: 144 mg/dL — ABNORMAL HIGH (ref 70–99)
Glucose-Capillary: 161 mg/dL — ABNORMAL HIGH (ref 70–99)
Glucose-Capillary: 35 mg/dL — CL (ref 70–99)
Glucose-Capillary: 36 mg/dL — CL (ref 70–99)
Glucose-Capillary: 89 mg/dL (ref 70–99)
Glucose-Capillary: 77 mg/dL (ref 70–99)
Glucose-Capillary: 193 mg/dL — ABNORMAL HIGH (ref 70–99)
Glucose-Capillary: 175 mg/dL — ABNORMAL HIGH (ref 70–99)

## 2022-02-05 LAB — HEMOGLOBIN AND HEMATOCRIT, BLOOD
HCT: 25.4 % — ABNORMAL LOW (ref 36.0–46.0)
Hemoglobin: 8.2 g/dL — ABNORMAL LOW (ref 12.0–15.0)

## 2022-02-05 LAB — CBC
HCT: 21.7 % — ABNORMAL LOW (ref 36.0–46.0)
Hemoglobin: 7.2 g/dL — ABNORMAL LOW (ref 12.0–15.0)
MCH: 23.6 pg — ABNORMAL LOW (ref 26.0–34.0)
MCHC: 33.2 g/dL (ref 30.0–36.0)
MCV: 71.1 fL — ABNORMAL LOW (ref 80.0–100.0)
Platelets: 50 10*3/uL — ABNORMAL LOW (ref 150–400)
RBC: 3.05 MIL/uL — ABNORMAL LOW (ref 3.87–5.11)
RDW: 30 % — ABNORMAL HIGH (ref 11.5–15.5)
WBC: 22.4 10*3/uL — ABNORMAL HIGH (ref 4.0–10.5)
nRBC: 0.2 % (ref 0.0–0.2)

## 2022-02-05 LAB — CULTURE, RESPIRATORY W GRAM STAIN

## 2022-02-05 LAB — BASIC METABOLIC PANEL
Anion gap: 8 (ref 5–15)
BUN: 41 mg/dL — ABNORMAL HIGH (ref 8–23)
CO2: 23 mmol/L (ref 22–32)
Calcium: 7.1 mg/dL — ABNORMAL LOW (ref 8.9–10.3)
Chloride: 116 mmol/L — ABNORMAL HIGH (ref 98–111)
Creatinine, Ser: 1.9 mg/dL — ABNORMAL HIGH (ref 0.44–1.00)
GFR, Estimated: 29 mL/min — ABNORMAL LOW (ref >60)
Glucose, Bld: 222 mg/dL — ABNORMAL HIGH (ref 70–99)
Potassium: 3.5 mmol/L (ref 3.5–5.1)
Sodium: 147 mmol/L — ABNORMAL HIGH (ref 135–145)

## 2022-02-05 LAB — PREPARE RBC (CROSSMATCH)

## 2022-02-05 LAB — COOXEMETRY PANEL
Carboxyhemoglobin: 2.1 % — ABNORMAL HIGH (ref 0.5–1.5)
Methemoglobin: <0.7 % (ref 0.0–1.5)
O2 Saturation: 81.5 %
Total hemoglobin: 7 g/dL — ABNORMAL LOW (ref 12.0–16.0)

## 2022-02-05 MED ORDER — INSULIN ASPART 100 UNIT/ML IJ SOLN
0.0000 [IU] | INTRAMUSCULAR | Status: DC
  Administered 2022-02-05 – 2022-02-09 (×10): 1 [IU] via SUBCUTANEOUS

## 2022-02-05 MED ORDER — POTASSIUM CHLORIDE 10 MEQ/50ML IV SOLN
INTRAVENOUS | Status: AC
  Filled 2022-02-05: qty 50

## 2022-02-05 MED ORDER — SODIUM CHLORIDE 0.9% IV SOLUTION: Freq: Once | INTRAVENOUS | Status: AC

## 2022-02-05 NOTE — Progress Notes (Signed)
NAME:  Nicole Cunningham, MRN:  [id number], DOB:  1960/12/08, LOS: 7 ADMISSION DATE:  01/28/2022, CONSULTATION DATE:  11/6 REFERRING MD:  Dr. Sedonia Small, CHIEF COMPLAINT:  septic shock   History of Present Illness:  Patient is a 61 year old female with pertinent PMH of COPD, anemia, HTN presents to [facility] ED on 11/6 with AMS.  Patient recently admitted to [facility] with AMS on 11/2021.  Found to have UTI and pyelonephritis.  On 11/5, patient having increased confusion.  Having some SOB over the past couple of days.  Brought to [facility] ED for further eval.  Upon arrival to [facility] ED, patient confused but able to state name.  Admitted with shock, AKI, lactic acidosis of 9.0  Pertinent ED labs: CO2 14, BUN 51, creat 4.13, AST 52, alk phos 159, AG 23, WBC 1.3, Hgb 9.3, platelets 70, troponin 64 then 80.  CT head without acute abnormality.  CXR no significant findings.  CT abdomen pelvis 17 mm nonobstructive renal calculus without hydronephrosis.  Found to have E. coli bacteremia and EF 20 to 25% on echo, mixed cardiogenic and septic shock  Pertinent  Medical History   Past Medical History:  Diagnosis Date   Bronchitis    COPD (chronic obstructive pulmonary disease) ([place])    Hypertension      Significant Hospital Events: Including procedures, antibiotic start and stop dates in addition to other pertinent events   11/6 septic shock on levo , ETT >> 11/6 Echo EF 20 to 25% global hypokinesis, apex intact 11/6 RIJ HD cath >> dobutamine at 5 mics 11/7>>Dobutamine increased to 7.5 mics , 1 unit PRBC and 1 unit platelet transfusion 11/8 dobutamine increased to 10 mics 11/10 dobut wean, diuretic, pigtail  11/13 restarted levophed overnight , Short run of SVT, weaned Dobutamine to 3 , repleted K, 1 Unit PRBC per cards ( HGB 7.2)  Interim History / Subjective:  + 15 L  1350 cc UO last 24 hours HGB 7.2>> blood ordered per cards CVP 4 Short Run of rapid SVT overnight , HR labile from 90-130's Levo at 2  mcg/min Precedex at 1 mcg/kg.hr Dobutamine at 3 mcg/kg/hr Fentanyl 400 mch/hr   Objective   Blood pressure 134/68, pulse (!) 143, temperature (!) 102.9 F (39.4 C), temperature source Oral, resp. rate (!) 29, height _0  (1.626 m), weight 48.1 kg, SpO2 100 %. CVP:  [0 mmHg-9 mmHg] 0 mmHg  Vent Mode: CPAP;PSV FiO2 (%):  [50 %] 50 % Set Rate:  [15 bmp-20 bmp] 15 bmp Vt Set:  [430 mL] 430 mL PEEP:  [5 cmH20] 5 cmH20 Pressure Support:  [10 cmH20] 10 cmH20 Plateau Pressure:  [18 cmH20] 18 cmH20   Intake/Output Summary (Last 24 hours) at 02/05/2022 0857 Last data filed at 02/05/2022 0800 Gross per 24 hour  Intake 3836.2 ml  Output 1570 ml  Net 2266.2 ml   Filed Weights   02/03/22 0431 02/04/22 0448 02/05/22 0401  Weight: 46.7 kg 48.2 kg 48.1 kg    Examination: General: Thin chronically ill woman in NAD HEENT: No JVD, LAD, MM pink and dry Neuro: opens eyes to touch and sound, no tracking of focusing, does not follow commands  Extremities:R lower arm + demarcated  no palpable or dopplerable radial/ulnar pulses Lower ext warm, with redness and serous blisters bilaterally to dorsal planes of feet, muscle wasting Lungs:Bilateral chest excursion with copious thick tan to blood tinged secretions, rhonchi, diminished per bases , on CPAP 10/5, L pigtail straw fluid, no air leak,  draining about 50 cc serous fluid daily Abd: soft hypoactive BS, NT, ND,TF at goal, Body mass index is 18.2 kg/m.  Physically deconditioned, weak   Coox 81% on dobut 5, dropping Dobutamine to 3 K 3.5 BUN 41, Creatinine 1.90 HGB 7.2 Glucose 222 WBC 21.7 from 22.4 Platelets 50,000 from 28,000 Fever of 102>> Blood Cultures ordered 11/13      Resolved Hospital Problem list     Assessment & Plan:  Mixed cardiogenic and septic shock: suspicion for sepsis cardiomyopathy.  E coli UTI.  Improving SvO2 and pressor needs. E coli bacteremia- did not grow in urine, ?GI source AKI- septic ATN +/- cardiorenal;  slightly worse after diuretic trial; she does not look that volume overloaded from edema standpoint but CXR looks wet Pancytopenia- sepsis related, H/h drifting down Septic encephalopathy- improving with treatment and sedation wean COPD: On Breo Ellipta Multifocal pna +/- edema- enterobacter in sputum, Cx still shows pending L parapneumonic effusion- pigtail in place N/V- making stool, s/p 2 days of reglan +staph hominis in urine- given improvement without coverage, will treat this as contaminant Nonobstructing ureteral stone- doubt contributing to current clinical picture Ischemic upper ext- POA, worse on R, ongoing thrombocytopenia and worsening anemia precludes most interventions, unfortunately may autoamputate Progressive anemia>> blackish stools, HGB drop,  on Protonix  - Broaden abx to meropenem , consider adding MRSA coverage with fever and leukocytosis - Continue OF to suction, postpyloric TF to goal - Inotropes,vasopressor , need for L/R HC per CHF team - Continue pigtail for now, as still draining about 50 cc per day - tracheal aspirate per lab will be finalized after 11/13 read - Continue vent support, precedex in preference to fent gtt, COAP/PS trials as tolerated - OP urology referral - Lung protective tidal volumes limiting driving pressures to < 15cm H2O as able - Sedation titrated to vent compliance and patient comfort using PAD orderset - VAP prevention bundle - Daily SAT/SBT when meets institutional criteria: secretion burden and weakness precludes extubation at present - Trend CBC, continue PPI BID, follow cultures ( Blood Cx 11/13)   With her worsening ext ischemia, baseline debility and advanced COPD combined with HCAP/poor cough/ongoing heavy secretion burden, I think she may be nearing end of life or nearing the point where she would not enjoy what little quality she has left.    Will ask PMT to broach this with family and start to think about what destination would  be acceptable to [facility] as she is high risk for trach/PEG/LTACH with missing limbs if she survives this hospitalization. Palliative care consult placed 11/13   Best Practice (right click and "Reselect all SmartList Selections" daily)   Diet/type: tubefeeds and NPO  DVT prophylaxis: SCD, can consider ppx once plts > 40k GI prophylaxis: PPI Lines: Central line and yes and it is still needed Foley:  Yes, and it is still needed Code Status:  full code Last date of multidisciplinary goals of care discussion [11/10 updated spouse Larry]  35 min cc time independent of procedures  Magdalen Spatz, MSN, AGACNP-BC [place] for personal pager PCCM on call pager [phone]  02/05/2022 8:57 AM

## 2022-02-05 NOTE — Consult Note (Addendum)
Palliative Care Consult Note                                  Date: 02/05/2022   Patient Name: Nicole Cunningham  DOB: Jul 04, 1960  MRN: [id number]  Age / Sex: 61 y.o., female  PCP: [name], MD Referring Physician: Spero Geralds, MD  Reason for Consultation: Establishing goals of care  HPI/Patient Profile: 61 y.o. female  with past medical history of COPD, anemia, HTN presents to [facility] ED on 11/6 with AMS. The patient recently admitted to [facility] with AMS on 11/2021.  Found to have UTI and pyelonephritis.  On 11/5, patient having increased confusion.  Having some SOB over the past couple of days.  Brought to [facility] ED for further eval. Upon arrival to [facility] ED, patient confused but able to state name.  Admitted with shock, AKI, lactic acidosis of 9.0  She was found to have E. coli bacteremia and EF 20 to 25% on echo, mixed cardiogenic and septic shock.   PMT was consulted for [place] conversations.  11/6 septic shock on levo , ETT 11/6 Echo EF 20 to 25% global hypokinesis, apex intact 11/6 RIJ HD cath >> dobutamine at 5 mics 11/7>>Dobutamine increased to 7.5 mics , 1 unit PRBC and 1 unit platelet transfusion 11/8 dobutamine increased to 10 mics 11/10 dobut wean, diuretic, pigtail  11/13 restarted levophed overnight , Short run of SVT, weaned Dobutamine to 3 , repleted K, 1 Unit PRBC per cards ( HGB 7.2)  Past Medical History:  Diagnosis Date   Bronchitis    COPD (chronic obstructive pulmonary disease) ([place])    Hypertension     Subjective:   This NP Walden Field reviewed medical records, received report from team, assessed the patient and then meet at the patient's bedside to discuss diagnosis, prognosis, GOC, EOL wishes disposition and options.  I met with the patient at the bedside, no family was present.  The patient's husband called later in the afternoon and we scheduled a time for family meeting tomorrow at 9:00 AM.  I spent extensive time  doing chart review and speaking with the care staff.   Concept of Palliative Care was introduced as specialized medical care for people and their families living with serious illness.  If focuses on providing relief from the symptoms and stress of a serious illness.  The goal is to improve quality of life for both the patient and the family. Values and goals of care important to patient and family were attempted to be elicited.  Created space and opportunity for patient  and family to explore thoughts and feelings regarding current medical situation   Natural trajectory and current clinical status were discussed. Questions and concerns addressed. Patient  encouraged to call with questions or concerns.    Patient/Family Understanding of Illness: Deferred  Life Review: Deferred  Patient Values: Deferred  Goals: Deferred  Today's Discussion: I spoke with the patient's husband this afternoon.  He understands that she is critically ill and in the hospital.  We discussed the need to need to talk about potential decisions in the near future.  He expressed some fear about what this really means and stated "are they basically wanting to shut everything off".  I assured him this is not the case.  However, there may be procedures such as trach, PEG that need to be considered in the short-term if she would want to  continue full and aggressive care.  I described my desire to bring these conversations up now so that when decisions are needed to be made is not a sudden surprise.  He seemed to understand and felt a little comforted by this.  He states that he works Monday through Thursday.  However, given that she is day 10 of intubation on Friday I recommended that we meet sooner to his day off to have discussions.  He stated he would speak with his work and tell them that the hospital needs to meet with him tomorrow morning so he will be here at 9:00 in the morning for family meeting.  I provided emotional  and general support through therapeutic listening, empathy, sharing of stories, and other techniques. I answered all questions and addressed all concerns to the best of my ability.  Review of Systems  Unable to perform ROS: Intubated    Objective:   Primary Diagnoses: Present on Admission:  Septic shock [facility])   Physical Exam Vitals and nursing note reviewed.  Constitutional:      Appearance: She is ill-appearing. She is not toxic-appearing.  HENT:     Head: Normocephalic and atraumatic.  Cardiovascular:     Rate and Rhythm: Tachycardia present.  Pulmonary:     Effort: No respiratory distress.     Comments: ETT noted Abdominal:     General: Abdomen is flat.     Palpations: Abdomen is soft.  Skin:    General: Skin is warm and dry.     Vital Signs:  BP 120/61   Pulse (!) 119   Temp 99.7 F (37.6 C) (Oral)   Resp 17   Ht _0  (1.626 m)   Wt 48.1 kg   SpO2 100%   BMI 18.20 kg/m   Palliative Assessment/Data: 10%    Advanced Care Planning:   Existing Vynca/ACP Documentation: None  Primary Decision Maker: NEXT OF KIN  Code Status/Advance Care Planning: Full code  A discussion was had today regarding advanced directives. Concepts specific to code status, artifical feeding and hydration, continued IV antibiotics and rehospitalization was had.  The difference between a aggressive medical intervention path and a palliative comfort care path for this patient at this time was had.   Decisions/Changes to ACP: None today  Assessment & Plan:   Impression: 61 year old female with chronic comorbidities and acute presentations as described above.  She has poor functional status as an outpatient, COPD.  She is now mixed septic and cardiogenic shock with AKI, pancytopenia, septic encephalopathy, multifocal pneumonia requiring pressors and eventually will need a left and right heart cath.  She also has limb ischemia to bilateral hands and bilateral feet unable to start  heparin due to pancytopenia and at risk for autoamputation.  She has been intubated for approximately 6 days.  Called and spoke with family about the need to meet to discuss possible interventions needed in the near term such as trach, PEG and to have further discussions about goals of care.  Plan family meeting tomorrow at 72 AM.  Overall prognosis poor.  SUMMARY OF RECOMMENDATIONS   Remain full code for now Remain full scope for now Plan family meeting at 9 AM tomorrow morning Further decisions to be made hopefully tomorrow PMT will continue to follow  Symptom Management:  Per primary team PMT is available to assist as needed  Prognosis:  Unable to determine  Discharge Planning:  To Be Determined   Discussed with: Medical team, nursing team, patient's family  Thank you for allowing [place] to participate in the care of TREVIA NOP PMT will continue to support holistically.  Time Total: 105 min  Greater than 50%  of this time was spent counseling and coordinating care related to the above assessment and plan.  Signed by: Walden Field, NP Palliative Medicine Team  Team Phone # [phone] (Nights/Weekends)  02/05/2022, 3:39 PM

## 2022-02-05 NOTE — Progress Notes (Signed)
Blood sugars low ( 70's) despite Tube feeds. ? If related to steroid being weaned Will drop SSI to very sensitive scale. If remains low on this scale, we will hold insulin and monitor  Bevelyn Ngo, MSN, AGACNP-BC [facility] Pulmonary/Critical Care Medicine See [place] for personal pager PCCM on call pager [phone]  02/05/2022 3:27 PM

## 2022-02-05 NOTE — Progress Notes (Addendum)
Progress Note    02/05/2022 7:54 AM * No surgery found *  Subjective:  intubated and sedated   Vitals:   02/05/22 0545 02/05/22 0754  BP: 136/77   Pulse: (!) 139   Resp: (!) 24   Temp:  (!) 102.9 F (39.4 C)  SpO2: 99%    Physical Exam: Cardiac:  tachy Lungs:  intubated Extremities:  bilateral upper extremity ischemia. Palpable brachial pulses bilaterally, no distal pulses. Forearms and hands cool with ischemic changes to bilateral hands and all digits. Also has some ischemic changes to toes on bilateral feet. Feet warm   CBC    Component Value Date/Time   WBC 22.4 (H) 02/05/2022 0202   RBC 3.05 (L) 02/05/2022 0202   HGB 7.2 (L) 02/05/2022 0202   HCT 21.7 (L) 02/05/2022 0202   PLT 50 (L) 02/05/2022 0202   MCV 71.1 (L) 02/05/2022 0202   MCH 23.6 (L) 02/05/2022 0202   MCHC 33.2 02/05/2022 0202   RDW 30.0 (H) 02/05/2022 0202   LYMPHSABS 0.6 (L) 02/02/2022 0313   MONOABS 0.6 02/02/2022 0313   EOSABS 0.0 02/02/2022 0313   BASOSABS 0.1 02/02/2022 0313    BMET    Component Value Date/Time   NA 147 (H) 02/05/2022 0202   NA 141 11/07/2021 0934   K 3.5 02/05/2022 0202   CL 116 (H) 02/05/2022 0202   CO2 23 02/05/2022 0202   GLUCOSE 222 (H) 02/05/2022 0202   BUN 41 (H) 02/05/2022 0202   BUN 12 11/07/2021 0934   CREATININE 1.90 (H) 02/05/2022 0202   CALCIUM 7.1 (L) 02/05/2022 0202   CALCIUM 10.4 (H) 12/07/2021 0007   GFRNONAA 29 (L) 02/05/2022 0202   GFRAA >60 11/29/2018 0931    INR    Component Value Date/Time   INR 2.2 (H) 01/29/2022 2330     Intake/Output Summary (Last 24 hours) at 02/05/2022 0754 Last data filed at 02/05/2022 0600 Gross per 24 hour  Intake 3831.77 ml  Output 1570 ml  Net 2261.77 ml     Assessment/Plan:  61 y.o. female who is critically ill with multi system organ failure with bilateral upper extremity ischemia. Palpable brachial pulses but no palpable radial or ulnar pulses. Duplex showed no flow at wrist but triphasic brachial  arteries. Suspect this is pressor induced. Also has some ischemic changes to toes but feet are warm. She is unfortunately not candidate for heparin with her thrombocytopenia. Not candidate for any intervention at this time. Unfortunately remains high risk for limb loss. Pending palliative care discussion   Dory Horn [facility] [phone] 02/05/2022 7:54 AM  I have seen and evaluated the patient. I agree with the PA note as documented above.  61 year old female intubated in the ICU on multiple pressors with multisystem organ failure for septic and cardiogenic shock.  Vascular was called yesterday for increasing ischemic changes to both hands over the last 2 to 3 days.  Duplex showed triphasic flow down to the brachial arteries with no thrombus with no identified flow in the radial or ulnar arteries in both upper extremities.  I think she has a grave prognosis.  She could not get heparin after discussion with critical care yesterday due to her profound thrombocytopenia and dropping hemoglobin.  Today she remains on multiple pressors and is now on Levophed and dobutamine and hypotensive on my evaluation.  I think a lot of this is systemic changes from her illness with pressor changes to the hands.  Agree with palliative care.  Marty Heck, MD [facility] [place] Office: [phone]

## 2022-02-05 NOTE — Progress Notes (Signed)
Patient ID: Nicole Cunningham, female   DOB: 05-Jan-1961, 61 y.o.   MRN: [id number]   Advanced Heart Failure Rounding Note  PCP-Cardiologist: None   Subjective:    Remains intubated, FiO2 0.5. On dobutamine 5, NE 2.  MAP stable but sinus tachy in 130s.  Co-ox 81%, CVP 4-5. Creatinine 1.9, mildly lower.   Last night, had short run rapid SVT.   Eyes open but not following commands.    Objective:   Weight Range: 48.1 kg Body mass index is 18.2 kg/m.   Vital Signs:   Temp:  [98.6 F (37 C)-102.9 F (39.4 C)] 102.9 F (39.4 C) (11/13 0754) Pulse Rate:  [69-150] 139 (11/13 0545) Resp:  [14-32] 24 (11/13 0545) BP: (78-178)/(37-101) 136/77 (11/13 0545) SpO2:  [94 %-100 %] 99 % (11/13 0754) FiO2 (%):  [50 %] 50 % (11/13 0754) Weight:  [48.1 kg] 48.1 kg (11/13 0401) Last BM Date : 02/04/22  Weight change: Filed Weights   02/03/22 0431 02/04/22 0448 02/05/22 0401  Weight: 46.7 kg 48.2 kg 48.1 kg    Intake/Output:   Intake/Output Summary (Last 24 hours) at 02/05/2022 0836 Last data filed at 02/05/2022 0600 Gross per 24 hour  Intake 3621.37 ml  Output 1570 ml  Net 2051.37 ml      Physical Exam    General: Eyes open, on vent Neck: No JVD, no thyromegaly or thyroid nodule.  Lungs: Decreased BS dependently.  CV: Nondisplaced PMI.  Heart tachy, regular S1/S2, no S3/S4, no murmur.  No peripheral edema.   Abdomen: Soft, nontender, no hepatosplenomegaly, no distention.  Skin: No rashes.  Neurologic: Eyes open but will not follow commands, no purposeful movement. Extremities: Ischemic fingers HEENT: Normal.    Telemetry   Sinus tach 120s-130s.  There was a run of SVT last night.  Personally reviewed  Labs    CBC Recent Labs    02/04/22 0209 02/05/22 0202  WBC 12.5* 22.4*  HGB 7.5* 7.2*  HCT 22.4* 21.7*  MCV 70.9* 71.1*  PLT 28* 50*   Basic Metabolic Panel Recent Labs    41/93/79 1133 02/05/22 0202  NA 148* 147*  K 3.9 3.5  CL 116* 116*  CO2 24 23   GLUCOSE 226* 222*  BUN 51* 41*  CREATININE 2.02* 1.90*  CALCIUM 7.3* 7.1*   Hemoglobin A1C No results for input(s): "HGBA1C" in the last 72 hours. Imaging    VAS [place] UPPER EXTREMITY ARTERIAL DUPLEX  Result Date: 02/04/2022  UPPER EXTREMITY DUPLEX STUDY Patient Name:  Nicole Cunningham  Date of Exam:   02/04/2022 Medical Rec #: [id number]         Accession #:    [id number] Date of Birth: 1960-08-01         Patient Gender: F Patient Age:   23 years Exam Location:  [facility] Procedure:      VAS [place] UPPER EXTREMITY ARTERIAL DUPLEX Referring Phys: Levon Hedger --------------------------------------------------------------------------------  Indications: Bilateral upper extremity ischemia. Cold, purple hands. Septic              shock, E-Coli, patient on pressors. Multisystem organ failure.  Risk Factors: Hypertension. Limitations: Intubation, multiple bandages and lines. Comparison Study: No prior study Performing Technologist: Sherren Kerns RVS  Examination Guidelines: A complete evaluation includes B-mode imaging, spectral Doppler, color Doppler, and power Doppler as needed of all accessible portions of each vessel. Bilateral testing is considered an integral part of a complete examination. Limited examinations for reoccurring indications may be performed as  noted.  Right Doppler Findings: +---------------+----------+---------+--------+--------+ Site           PSV (cm/s)Waveform StenosisComments +---------------+----------+---------+--------+--------+ Subclavian Mid 79        triphasic                 +---------------+----------+---------+--------+--------+ Subclavian Dist                                    +---------------+----------+---------+--------+--------+ Axillary       95        triphasic                 +---------------+----------+---------+--------+--------+ Brachial Prox  101       triphasic                  +---------------+----------+---------+--------+--------+ Brachial Dist  72        triphasic                 +---------------+----------+---------+--------+--------+ Radial Prox              absent                    +---------------+----------+---------+--------+--------+ Radial Mid               absent                    +---------------+----------+---------+--------+--------+ Radial Dist              absent                    +---------------+----------+---------+--------+--------+ Ulnar Prox               absent                    +---------------+----------+---------+--------+--------+ Ulnar Mid                absent                    +---------------+----------+---------+--------+--------+ Ulnar Dist               absent                    +---------------+----------+---------+--------+--------+ Palmar Arch              absent                    +---------------+----------+---------+--------+--------+   Left Doppler Findings: +--------------+----------+---------+--------+--------+ Site          PSV (cm/s)Waveform StenosisComments +--------------+----------+---------+--------+--------+ Subclavian Mid210       triphasic                 +--------------+----------+---------+--------+--------+ Axillary      98        triphasic                 +--------------+----------+---------+--------+--------+ Brachial Prox 95        triphasic                 +--------------+----------+---------+--------+--------+ Brachial Dist 90        triphasic                 +--------------+----------+---------+--------+--------+ Radial Prox   55        thump                     +--------------+----------+---------+--------+--------+  Radial Mid    38        thump                     +--------------+----------+---------+--------+--------+ Radial Dist             absent                    +--------------+----------+---------+--------+--------+  Ulnar Prox              absent                    +--------------+----------+---------+--------+--------+ Ulnar Mid               absent                    +--------------+----------+---------+--------+--------+ Ulnar Dist              absent                    +--------------+----------+---------+--------+--------+ Palmar Arch             absent                    +--------------+----------+---------+--------+--------+   Summary:  Right: Patent subclavian, axillary, and brachial arteries with        triphasic flow. No flow noted throughout the radial and ulnar        arteries or in the palm. Left: Patent subclavian, axillary, and brachial arteries with       triphasic flow. No flow noted in the entirety of the ulnar       artery or in the palm. There is "thumping flow" noted in the       proximal to mid radial artery and no flow noted mid to distal       radial artery. *See table(s) above for measurements and observations. Electronically signed by Sherald Hess MD on 02/04/2022 at 2:27:52 PM.    Final      Medications:     Scheduled Medications:  arformoterol  15 mcg Nebulization BID   budesonide (PULMICORT) nebulizer solution  0.25 mg Nebulization BID   Chlorhexidine Gluconate Cloth  6 each Topical Q0600   docusate  100 mg Per Tube BID   fentaNYL (SUBLIMAZE) injection  50 mcg Intravenous Once   insulin aspart  2-6 Units Subcutaneous Q4H   mouth rinse  15 mL Mouth Rinse Q2H   pantoprazole (PROTONIX) IV  40 mg Intravenous Q12H   polyethylene glycol  17 g Per Tube Daily   sodium chloride flush  10 mL Intrapleural Q8H   sodium chloride flush  10-40 mL Intracatheter Q12H   sodium chloride flush  3 mL Intravenous Q12H    Infusions:  sodium chloride 10 mL/hr at 02/05/22 0600   sodium chloride Stopped (02/03/22 2325)   sodium chloride     dexmedetomidine (PRECEDEX) IV infusion 1 mcg/kg/hr (02/05/22 0600)   DOBUTamine 5 mcg/kg/min (02/05/22 0600)   feeding supplement  (PEPTAMEN 1.5 CAL) 50 mL/hr at 02/05/22 0600   fentaNYL infusion INTRAVENOUS 400 mcg/hr (02/05/22 0651)   meropenem (MERREM) IV Stopped (02/04/22 2139)   norepinephrine (LEVOPHED) Adult infusion 2 mcg/min (02/05/22 0600)    PRN Medications: Place/Maintain arterial line **AND** sodium chloride, sodium chloride, acetaminophen, fentaNYL, ipratropium-albuterol, mouth rinse, sodium chloride flush, sodium chloride flush    Patient Profile  61 y/o AAF w/ h/o HTN, COPD, anemia, h/o poor med compliance and  prior admission to [facility] 9/23 for UTI and pyelonephritis, admitted w/ mixed septic and cardiogenic shock.    Assessment/Plan   1. Shock: Suspect mixed septic shock and cardiogenic shock.  Lactate was > 9 with PCT > 150.  E coli bacteremia with echo showing EF 20-25%.  Remains on dobutamine 5 mcg/kg/min and NE 2, MAP stable.  Co-ox 81%.  - Meropenem for broad spectrum coverage.   - Wean NE as MAP tolerates.  - Decrease dobutamine to 3 today with excellent co-ox.  2. Acute systolic CHF -> cardiogenic shock: Echo showed EF 20-25% with preservation of apical function but severe hypokinesis of other walls, normal RV, dilated IVC. Unusual wall motion abnormality pattern, prior echo was normal. ?Septic cardiomyopathy with reverse Takotsubo-type pattern.  HS-TnI was minimally elevated with no trend so doubt ACS. Co-ox 81% with CVP 4-5.  Creatinine down to 1.9.  MAP stable.  - Decrease dobutamine to 3.  - Wean NE as able with BP.  - Volume status ok. Holding diuretics for now  - Would plan for LHC/RHC if/when stabilized and creatinine/plts improved to rule out CAD.  3. AKI: In setting of septic shock.  Creatinine trending down.  4. ID: E coli bacteremia.  PCT > 150 initially. WBCs up to 22 today.  Also found to have Enterobacter cloacae in trach aspirate.  Febrile to 102.9 this morning.    - Continue meropenem.  - Repeat blood cultures with fever today.  5. Anemia: Hgb down to 7.2 today.   - Would  transfuse 1 unit PRBCs.  6. Thrombocytopenia: Platelets rising, up to 50K today. Suspect fall was due to critical illness/sepsis/inflammation. 7. Acute hypoxemic respiratory failure: PNA on CXR, also suspect pulmonary edema. Intubated.  She has a pre-existing history of COPD.  - Per CCM 8. Ischemic digits bilateral hands: Palpable brachial pulses, no palpable ulnar or radial pulses.  Ischemic digits with no flow on duplex US at wrists.  Suspect pressor-induced. High risk for auto-amputation.  - Platelets rising, discuss addition of heparin gtt with CCM.  9. Neuro: Eyes are open but no purposeful movement.  10. Goals of care: Critical illness with MSOF.  I am concerned that even if she survives this episode, quality of life will be poor.  She will likely lose fingers and is probably looking at trach/PEG if she survives.  - Consult palliative care.    CRITICAL CARE Performed by: Marca Ancona  Total critical care time: 45 minutes  Critical care time was exclusive of separately billable procedures and treating other patients.  Critical care was necessary to treat or prevent imminent or life-threatening deterioration.  Critical care was time spent personally by me (independent of midlevel providers or residents) on the following activities: development of treatment plan with patient and/or surrogate as well as nursing, discussions with consultants, evaluation of patient's response to treatment, examination of patient, obtaining history from patient or surrogate, ordering and performing treatments and interventions, ordering and review of laboratory studies, ordering and review of radiographic studies, pulse oximetry and re-evaluation of patient's condition.   Length of Stay: 7  Marca Ancona, MD  02/05/2022, 8:36 AM  Advanced Heart Failure Team Pager [phone] (M-F; 7a - 5p)  Please contact [facility] for night-coverage after hours (5p -7a ) and weekends on [email or web address]

## 2022-02-06 ENCOUNTER — Inpatient Hospital Stay (HOSPITAL_COMMUNITY): Payer: Self-pay

## 2022-02-06 DIAGNOSIS — Z66 Do not resuscitate: Secondary | ICD-10-CM

## 2022-02-06 LAB — CBC WITH DIFFERENTIAL/PLATELET
Abs Immature Granulocytes: 1.07 10*3/uL — ABNORMAL HIGH (ref 0.00–0.07)
Basophils Absolute: 0.1 10*3/uL (ref 0.0–0.1)
Basophils Relative: 0 %
Eosinophils Absolute: 0.3 10*3/uL (ref 0.0–0.5)
Eosinophils Relative: 1 %
HCT: 24.6 % — ABNORMAL LOW (ref 36.0–46.0)
Hemoglobin: 8.3 g/dL — ABNORMAL LOW (ref 12.0–15.0)
Immature Granulocytes: 4 %
Lymphocytes Relative: 5 %
Lymphs Abs: 1.2 10*3/uL (ref 0.7–4.0)
MCH: 25.3 pg — ABNORMAL LOW (ref 26.0–34.0)
MCHC: 33.7 g/dL (ref 30.0–36.0)
MCV: 75 fL — ABNORMAL LOW (ref 80.0–100.0)
Monocytes Absolute: 0.3 10*3/uL (ref 0.1–1.0)
Monocytes Relative: 1 %
Neutro Abs: 21.6 10*3/uL — ABNORMAL HIGH (ref 1.7–7.7)
Neutrophils Relative %: 89 %
Platelets: 57 10*3/uL — ABNORMAL LOW (ref 150–400)
RBC: 3.28 MIL/uL — ABNORMAL LOW (ref 3.87–5.11)
RDW: 28.3 % — ABNORMAL HIGH (ref 11.5–15.5)
WBC: 24.5 10*3/uL — ABNORMAL HIGH (ref 4.0–10.5)
nRBC: 0 % (ref 0.0–0.2)

## 2022-02-06 LAB — GLUCOSE, CAPILLARY
Glucose-Capillary: 122 mg/dL — ABNORMAL HIGH (ref 70–99)
Glucose-Capillary: 131 mg/dL — ABNORMAL HIGH (ref 70–99)
Glucose-Capillary: 142 mg/dL — ABNORMAL HIGH (ref 70–99)
Glucose-Capillary: 157 mg/dL — ABNORMAL HIGH (ref 70–99)
Glucose-Capillary: 162 mg/dL — ABNORMAL HIGH (ref 70–99)
Glucose-Capillary: 181 mg/dL — ABNORMAL HIGH (ref 70–99)

## 2022-02-06 LAB — COMPREHENSIVE METABOLIC PANEL
ALT: 10 U/L (ref 0–44)
AST: 135 U/L — ABNORMAL HIGH (ref 15–41)
Albumin: <1.5 g/dL — ABNORMAL LOW (ref 3.5–5.0)
Alkaline Phosphatase: 69 U/L (ref 38–126)
Anion gap: 6 (ref 5–15)
BUN: 44 mg/dL — ABNORMAL HIGH (ref 8–23)
CO2: 22 mmol/L (ref 22–32)
Calcium: 7.4 mg/dL — ABNORMAL LOW (ref 8.9–10.3)
Chloride: 124 mmol/L — ABNORMAL HIGH (ref 98–111)
Creatinine, Ser: 1.88 mg/dL — ABNORMAL HIGH (ref 0.44–1.00)
GFR, Estimated: 30 mL/min — ABNORMAL LOW (ref >60)
Glucose, Bld: 191 mg/dL — ABNORMAL HIGH (ref 70–99)
Potassium: 3.9 mmol/L (ref 3.5–5.1)
Sodium: 152 mmol/L — ABNORMAL HIGH (ref 135–145)
Total Bilirubin: 2.5 mg/dL — ABNORMAL HIGH (ref 0.3–1.2)
Total Protein: 4.4 g/dL — ABNORMAL LOW (ref 6.5–8.1)

## 2022-02-06 LAB — BPAM RBC
Blood Product Expiration Date: 202311262359
ISSUE DATE / TIME: 202311131203
Unit Type and Rh: 6200

## 2022-02-06 LAB — PATHOLOGIST SMEAR REVIEW: Path Review: NEGATIVE

## 2022-02-06 LAB — TYPE AND SCREEN
ABO/RH(D): AB POS
Antibody Screen: NEGATIVE
Unit division: 0

## 2022-02-06 LAB — COOXEMETRY PANEL
Carboxyhemoglobin: 2 % — ABNORMAL HIGH (ref 0.5–1.5)
Methemoglobin: 0.7 % (ref 0.0–1.5)
O2 Saturation: 85.8 %
Total hemoglobin: 8.7 g/dL — ABNORMAL LOW (ref 12.0–16.0)

## 2022-02-06 LAB — MAGNESIUM: Magnesium: 1.3 mg/dL — ABNORMAL LOW (ref 1.7–2.4)

## 2022-02-06 LAB — HEPARIN LEVEL (UNFRACTIONATED): Heparin Unfractionated: <0.1 IU/mL — ABNORMAL LOW (ref 0.30–0.70)

## 2022-02-06 MED ORDER — ETOMIDATE 2 MG/ML IV SOLN
INTRAVENOUS | Status: AC
  Administered 2022-02-06: 20 mg via INTRAVENOUS
  Filled 2022-02-06: qty 10

## 2022-02-06 MED ORDER — ROCURONIUM BROMIDE 50 MG/5ML IV SOLN: 50.0000 mg | Freq: Once | INTRAVENOUS | Status: AC

## 2022-02-06 MED ORDER — FREE WATER
200.0000 mL | Freq: Four times a day (QID) | Status: DC
  Administered 2022-02-06 – 2022-02-09 (×13): 200 mL

## 2022-02-06 MED ORDER — HEPARIN (PORCINE) 25000 UT/250ML-% IV SOLN
1200.0000 [IU]/h | INTRAVENOUS | Status: DC
  Administered 2022-02-06: 650 [IU]/h via INTRAVENOUS
  Administered 2022-02-07: 950 [IU]/h via INTRAVENOUS
  Administered 2022-02-08: 1150 [IU]/h via INTRAVENOUS
  Administered 2022-02-09 – 2022-02-14 (×6): 1250 [IU]/h via INTRAVENOUS
  Filled 2022-02-06 (×13): qty 250

## 2022-02-06 MED ORDER — FUROSEMIDE 10 MG/ML IJ SOLN
40.0000 mg | Freq: Two times a day (BID) | INTRAMUSCULAR | Status: DC
  Administered 2022-02-06 (×2): 40 mg via INTRAVENOUS
  Filled 2022-02-06 (×2): qty 4

## 2022-02-06 MED ORDER — FREE WATER: 200.0000 mL | Status: DC

## 2022-02-06 MED ORDER — MAGNESIUM SULFATE 4 GM/100ML IV SOLN
4.0000 g | Freq: Once | INTRAVENOUS | Status: AC
  Administered 2022-02-06: 4 g via INTRAVENOUS
  Filled 2022-02-06: qty 100

## 2022-02-06 MED ORDER — ROCURONIUM BROMIDE 10 MG/ML (PF) SYRINGE
PREFILLED_SYRINGE | INTRAVENOUS | Status: AC
  Administered 2022-02-06: 50 mg via INTRAVENOUS
  Filled 2022-02-06: qty 10

## 2022-02-06 MED ORDER — ETOMIDATE 2 MG/ML IV SOLN: 20.0000 mg | Freq: Once | INTRAVENOUS | Status: AC

## 2022-02-06 MED ORDER — LIP MEDEX EX OINT
TOPICAL_OINTMENT | CUTANEOUS | Status: DC | PRN
  Administered 2022-02-06: 1 via TOPICAL
  Administered 2022-02-06: 75 via TOPICAL
  Administered 2022-02-06: 1 via TOPICAL
  Filled 2022-02-06: qty 7

## 2022-02-06 NOTE — Procedures (Signed)
Bronchoscopy Procedure Note  Nicole Cunningham  [id number]  30-Sep-1960  Date:02/06/22  Time:10:55 AM   Provider Performing:Phebe Dettmer   Procedure(s):  Flexible bronchoscopy with bronchial alveolar lavage (23557)  Indication(s) Worsening bilateral pneumonia  Consent Risks of the procedure as well as the alternatives and risks of each were explained to the patient and/or caregiver.  Consent for the procedure was obtained and is signed in the bedside chart  Anesthesia Etomidate and rocuronium   Time Out Verified patient identification, verified procedure, site/side was marked, verified correct patient position, special equipment/implants available, medications/allergies/relevant history reviewed, required imaging and test results available.   Sterile Technique Usual hand hygiene, masks, gowns, and gloves were used   Procedure Description Bronchoscope advanced through endotracheal tube and into airway.  Airways were examined down to subsegmental level with findings noted below.   Following diagnostic evaluation, BAL(s) performed in RLL with normal saline and return of greenish fluid  Findings: Copious amount of tenacious secretions noted in the right middle/right lower lobe and left lower lobe, suctioned and BAL was performed in left lower lobe   Complications/Tolerance None; patient tolerated the procedure well. Chest X-ray is not needed post procedure.   EBL Minimal   Specimen(s) BAL

## 2022-02-06 NOTE — Progress Notes (Signed)
NAME:  Nicole Cunningham, MRN:  [id number], DOB:  Oct 07, 1960, LOS: 61 ADMISSION DATE:  01/28/2022, CONSULTATION DATE:  11/6 REFERRING MD:  Dr. Sedonia Small, CHIEF COMPLAINT:  septic shock   History of Present Illness:  Patient is a 61 year old female with pertinent PMH of COPD, anemia, HTN presents to [facility] ED on 11/6 with AMS.  Patient recently admitted to [facility] with AMS on 11/2021.  Found to have UTI and pyelonephritis.  On 11/5, patient having increased confusion.  Having some SOB over the past couple of days.  Brought to [facility] ED for further eval.  Upon arrival to [facility] ED, patient confused but able to state name.  Admitted with shock, AKI, lactic acidosis of 9.0  Pertinent ED labs: CO2 14, BUN 51, creat 4.13, AST 52, alk phos 159, AG 23, WBC 1.3, Hgb 9.3, platelets 70, troponin 64 then 80.  CT head without acute abnormality.  CXR no significant findings.  CT abdomen pelvis 17 mm nonobstructive renal calculus without hydronephrosis.  Found to have E. coli bacteremia and EF 20 to 25% on echo, mixed cardiogenic and septic shock  Pertinent  Medical History   Past Medical History:  Diagnosis Date   Bronchitis    COPD (chronic obstructive pulmonary disease) ([place])    Hypertension      Significant Hospital Events: Including procedures, antibiotic start and stop dates in addition to other pertinent events   11/6 septic shock on levo , ETT >> 11/6 Echo EF 20 to 25% global hypokinesis, apex intact 11/6 RIJ HD cath >> dobutamine at 5 mics 11/7>>Dobutamine increased to 7.5 mics , 1 unit PRBC and 1 unit platelet transfusion 11/8 dobutamine increased to 10 mics 11/10 dobut wean, diuretic, pigtail  11/13 restarted levophed overnight , Short run of SVT, weaned Dobutamine to 3 , repleted K, 1 Unit PRBC per cards ( HGB 7.2)  Interim History / Subjective:  + 15 L  1418 cc UO last 24 hours CVP 3 Came off of Levophed On Precedex and fentanyl Dobutamine at 2 mcg/kg/hr Continue to spike fever with Tmax  101.1   Objective   Blood pressure 128/75, pulse (!) 119, temperature (!) 100.7 F (38.2 C), temperature source Axillary, resp. rate (!) 25, height _0  (1.626 m), weight 48.1 kg, SpO2 97 %. CVP:  [3 mmHg-48 mmHg] 48 mmHg  Vent Mode: CPAP;PSV FiO2 (%):  [50 %-60 %] 50 % Set Rate:  [15 bmp] 15 bmp Vt Set:  [430 mL] 430 mL PEEP:  [5 cmH20] 5 cmH20 Pressure Support:  [10 cmH20] 10 cmH20 Plateau Pressure:  [15 cmH20] 15 cmH20   Intake/Output Summary (Last 24 hours) at 02/06/2022 0942 Last data filed at 02/06/2022 0900 Gross per 24 hour  Intake 2794.18 ml  Output 1598 ml  Net 1196.18 ml   Filed Weights   02/05/22 0401 02/06/22 0327 02/06/22 0800  Weight: 48.1 kg 48.1 kg 48.1 kg    Examination:   Physical exam: General: Crtitically ill-appearing female, orally intubated HEENT: [place]/AT, eyes anicteric.  ETT and OGT in place Neuro: Opens eyes to vocal stimuli, not following commands, pupils 4 mm bilateral reactive to light Chest: Bilateral rhonchorous breath sounds, no wheezes heart: Regular rate and rhythm, no murmurs or gallops Abdomen: Soft, nontender, nondistended, bowel sounds present Skin: Bilateral finger/gangrene   Coox 86% on dobut 3, dropping Dobutamine to 2 K 3.9, serum sodium 152 BUN 44, Creatinine 1.88 HGB 8.3 WBC 24.5 Platelets 57k  Resolved Hospital Problem list     Assessment &  Plan:  Mixed cardiogenic and septic shock: with suspicion for sepsis cardiomyopathy.  E coli UTI E coli bacteremia AKI due to septic ATN Pancytopenia- sepsis related Septic encephalopathy- improving with treatment and sedation wean COPD: On Breo Ellipta Acute hypoxic respiratory failure due to multifocal pna caused by ESBL enterobacter  L parapneumonic effusion- s/p pigtail in place Nonobstructing ureteral stone- doubt contributing to current clinical picture Ischemic upper ext with dry gangrene- POA, worse on R, ongoing thrombocytopenia and worsening anemia precludes most  interventions, unfortunately may autoamputate  Continue broad-spectrum antibiotics with meropenem Though she will continue to spike fever, white count is going up and Remain off vasopressor support currently on dobutamine Dobutamine was decreased to per heart failure team Continue pigtail for now, as still draining about 50 cc per day We will perform bronchoscopy x-ray chest is showing worsening pneumonia Outpatient follow-up with urology Continue lung protective ventilation Stop IV fluid Continue Precedex and fentanyl VAP prevention bundle Appreciate vascular surgery follow-up, recommend continuing low-dose heparin Goals of care discussions are ongoing, palliative care is following   Best Practice (right click and "Reselect all SmartList Selections" daily)   Diet/type: tubefeeds and NPO, tube feeds DVT prophylaxis: Systemic heparin GI prophylaxis: PPI Lines: Central line and yes and it is still needed Foley:  Yes, and it is still needed Code Status:  DNR Last date of multidisciplinary goals of care discussion [11/14 updated patient's family at bedside, they had meeting with palliative care, patient was made DNR but full scope of treatment currently.    Total critical care time: 41 minutes  Performed by: [place] care time was exclusive of separately billable procedures and treating other patients.   Critical care was necessary to treat or prevent imminent or life-threatening deterioration.   Critical care was time spent personally by me on the following activities: development of treatment plan with patient and/or surrogate as well as nursing, discussions with consultants, evaluation of patient's response to treatment, examination of patient, obtaining history from patient or surrogate, ordering and performing treatments and interventions, ordering and review of laboratory studies, ordering and review of radiographic studies, pulse oximetry and re-evaluation of  patient's condition.   Jacky Kindle, MD [facility] See Amion for pager If no response to pager, please call [phone] until 7pm After 7pm, Please call E-link [phone]

## 2022-02-06 NOTE — Progress Notes (Signed)
RT NOTE: RT assisted MD with bedside bronchoscopy. No complications. RT will continue to monitor.

## 2022-02-06 NOTE — Progress Notes (Signed)
Daily Progress Note   Patient Name: Nicole Cunningham       Date: 02/06/2022 DOB: 1960-12-08  Age: 61 y.o. MRN#: [id number] Attending Physician: Jacky Kindle, MD Primary Care Physician: [name], MD Admit Date: 01/28/2022 Length of Stay: 8 days  Reason for Consultation/Follow-up: Establishing goals of care  HPI/Patient Profile:  61 y.o. female  with past medical history of COPD, anemia, HTN presents to [facility] ED on 11/6 with AMS. The patient recently admitted to [facility] with AMS on 11/2021.  Found to have UTI and pyelonephritis.  On 11/5, patient having increased confusion.  Having some SOB over the past couple of days.  Brought to [facility] ED for further eval. Upon arrival to [facility] ED, patient confused but able to state name.  Admitted with shock, AKI, lactic acidosis of 9.0  She was found to have E. coli bacteremia and EF 20 to 25% on echo, mixed cardiogenic and septic shock.    PMT was consulted for [place] conversations.  Subjective:   Subjective: Chart Reviewed. Updates received. Patient Assessed. Created space and opportunity for patient  and family to explore thoughts and feelings regarding current medical situation.  Today's Discussion: Today met with the patient at the bedside.  She is unable to have meaningful conversation.  The patient's husband Lylianna Fraiser, Sr. and her son Marilou, [place]. were also present.  The patient's husband and son and I went to the consult room for further discussion.  Patient/Family Understanding of Illness: They understand she is still not breathing on her own.  Medications are keeping her blood pressure up and her heart pumping and fighting bacteria.  She is requiring medications for the poor flow to her limbs.  We had further discussion on multiple acute presentations including mixed shock both cardiogenic and septic, AKI likely due to cardiorenal, pancytopenia with a progressive anemia, septic encephalopathy, multifocal pneumonia, ischemic upper extremities  with high risk for autoamputation, not currently a candidate for surgical amputation, pressor requirements.   Life Review: The patient's husband describes her jokingly as "stubborn".  Her husband and son states that she really likes close and focuses on looking good.  She enjoys pocket books and wakes to alter her parents.  She does not work outside the home.  They describe her as a complicated person who enjoys word searches and watching [place] [employer] on TV.  Her favorite music is old school [employer].  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   Patient Values: Family, he describes "lost for life"   Goals: DNR.  Further decisions as we move forward.   Today's Discussion:  Ended session to discussions described above we had extensive discussion on various topics.  The patient's husband describes that they have both always been independent and valued their independence.  They have always said to each other "if it is my time to leave and give me dignity."  He states that he would never want to be artificially prolonged and he does not feel that she would either.  He describes the good times and bad times that they have had but overall they have had a good life.  He feels that as long as their son is cared for that she would be happy.  Later in our conversations he stated that "he would not want her to be dependent depended on artificial prolongation indefinitely and would not want to send her to a long-term facility without any fingers or hope for meaningful recovery."    We  discussed that the medical team will likely want a decision on tracheostomy by Thursday.  I shared that the patient requires a tracheostomy and PEG tube this would likely portend a prolonged, if not indefinite/lifelong, stay in a nursing facility with possibility of not being able to interact with her loved ones, likely with surgical amputation or autoamputation of her digits.  He expresses the sentiment that she would  likely not want this.  We agreed to continue to discuss over the next couple days before making decisions.  I provided emotional and general support through therapeutic listening, empathy, sharing of stories, and other techniques. I answered all questions and addressed all concerns to the best of my ability.  Review of Systems  Unable to perform ROS: Intubated    Objective:   Vital Signs:  BP 128/75   Pulse (!) 119   Temp (!) 100.7 F (38.2 C) (Axillary) Comment: tylenol given  Resp (!) 25   Ht _0  (1.626 m)   Wt 48.1 kg   SpO2 97%   BMI 18.20 kg/m   Physical Exam: Physical Exam Vitals and nursing note reviewed.  Constitutional:      General: She is not in acute distress.    Appearance: She is ill-appearing.  HENT:     Head: Normocephalic and atraumatic.  Cardiovascular:     Rate and Rhythm: Tachycardia present.     Pulses:          Radial pulses are 0 on the right side and 0 on the left side.     Comments: Discoloration of bilateral UE, both cold to the touch up through mid-forearm Pulmonary:     Effort: No respiratory distress.  Abdominal:     General: Abdomen is flat.     Palpations: Abdomen is soft.  Skin:    General: Skin is warm and dry.  Neurological:     Mental Status: She is unresponsive.     Palliative Assessment/Data: 10%    Existing Vynca/ACP Documentation: None  Assessment & Plan:   Impression: Present on Admission:  Septic shock ([place])  61 year old female with chronic comorbidities and acute presentations as described above.  She has poor functional status as an outpatient, COPD.  She is now mixed septic and cardiogenic shock with AKI, pancytopenia, septic encephalopathy, multifocal pneumonia requiring pressors and eventually will need a left and right heart cath.  She also has limb ischemia to bilateral hands and bilateral feet unable to start heparin due to pancytopenia and at risk for autoamputation.  She has been intubated for  approximately 6 days.  Met with family about the need about possible interventions needed in the near term such as trach, PEG and to have further discussions about goals of care.  Plan family meeting tomorrow at 2 AM.  Overall prognosis poor.   SUMMARY OF RECOMMENDATIONS   Changed to DNR Continue to treat the treatable Further discussions this week Will likely need trach decision by Thursday PMT will continue to follow and support patient and family  Symptom Management:  Per primary team PMT is available to assist as needed  Code Status: DNR  Prognosis: Unable to determine  Discharge Planning: To Be Determined  Discussed with: Patient's family, medical team, nursing team  Thank you for allowing [place] to participate in the care of GLEN BLATCHLEY PMT will continue to support holistically.  Time Total: 90 min  Visit consisted of counseling and education dealing with the complex and emotionally intense issues of symptom management  and palliative care in the setting of serious and potentially life-threatening illness. Greater than 50%  of this time was spent counseling and coordinating care related to the above assessment and plan.  Walden Field, NP Palliative Medicine Team  Team Phone # [phone] (Nights/Weekends)  11/22/2020, 8:17 AM

## 2022-02-06 NOTE — Progress Notes (Signed)
[facility] ADULT ICU REPLACEMENT PROTOCOL   The patient does apply for the [facility] Adult ICU Electrolyte Replacment Protocol based on the criteria listed below:   1.Exclusion criteria: TCTS, ECMO, Dialysis, and Myasthenia Gravis patients 2. Is GFR >/= 30 ml/min? Yes.    Patient's GFR today is 30 3. Is SCr </= 2? Yes.   Patient's SCr is 1.88 mg/dL 4. Did SCr increase >/= 0.5 in 24 hours? No. 5.Pt's weight >40kg  Yes.   6. Abnormal electrolyte(s): Mag 1.3  7. Electrolytes replaced per protocol 8.  Call MD STAT for K+ </= 2.5, Phos </= 1, or Mag </= 1 Physician:    Markus Daft A 02/06/2022 5:40 AM

## 2022-02-06 NOTE — Progress Notes (Signed)
Patient ID: Nicole Cunningham, female   DOB: 1960/09/13, 61 y.o.   MRN: [id number]   Advanced Heart Failure Rounding Note  PCP-Cardiologist: None   Subjective:    Remains intubated, FiO2 0.5. On dobutamine 3, off NE.  MAP stable but sinus tachy in 110s.  Co-ox 86%, CVP 3. Creatinine 1.9 => 1.88.   Eyes open but still not following commands.    Objective:   Weight Range: 48.1 kg Body mass index is 18.2 kg/m.   Vital Signs:   Temp:  [98.5 F (36.9 C)-102.9 F (39.4 C)] 98.5 F (36.9 C) (11/14 0315) Pulse Rate:  [101-145] 116 (11/14 0700) Resp:  [15-31] 22 (11/14 0700) BP: (82-149)/(46-87) 122/71 (11/14 0700) SpO2:  [90 %-100 %] 98 % (11/14 0700) FiO2 (%):  [50 %-60 %] 50 % (11/14 0439) Weight:  [48.1 kg] 48.1 kg (11/14 0327) Last BM Date : 02/05/22  Weight change: Filed Weights   02/04/22 0448 02/05/22 0401 02/06/22 0327  Weight: 48.2 kg 48.1 kg 48.1 kg    Intake/Output:   Intake/Output Summary (Last 24 hours) at 02/06/2022 0750 Last data filed at 02/06/2022 0600 Gross per 24 hour  Intake 2632.44 ml  Output 1418 ml  Net 1214.44 ml      Physical Exam    General: On vent Neck: No JVD, no thyromegaly or thyroid nodule.  Lungs: Clear to auscultation bilaterally with normal respiratory effort. CV: Nondisplaced PMI.  Heart tachy, regular S1/S2, no S3/S4, no murmur.  No peripheral edema.   Abdomen: Soft, nontender, no hepatosplenomegaly, no distention.  Skin: Intact without lesions or rashes.  Neurologic: Alert and oriented x 3.  Psych: Normal affect. Extremities: Ischemic digits on hands HEENT: Normal.    Telemetry   Sinus tachy 110s  Personally reviewed  Labs    CBC Recent Labs    02/05/22 0202 02/05/22 1950 02/06/22 0219  WBC 22.4*  --  24.5*  NEUTROABS  --   --  21.6*  HGB 7.2* 8.2* 8.3*  HCT 21.7* 25.4* 24.6*  MCV 71.1*  --  75.0*  PLT 50*  --  57*   Basic Metabolic Panel Recent Labs    17/51/02 0202 02/06/22 0219  NA 147* 152*  K 3.5  3.9  CL 116* 124*  CO2 23 22  GLUCOSE 222* 191*  BUN 41* 44*  CREATININE 1.90* 1.88*  CALCIUM 7.1* 7.4*  MG  --  1.3*   Hemoglobin A1C No results for input(s): "HGBA1C" in the last 72 hours. Imaging    No results found.   Medications:     Scheduled Medications:  arformoterol  15 mcg Nebulization BID   budesonide (PULMICORT) nebulizer solution  0.25 mg Nebulization BID   Chlorhexidine Gluconate Cloth  6 each Topical Q0600   docusate  100 mg Per Tube BID   fentaNYL (SUBLIMAZE) injection  50 mcg Intravenous Once   insulin aspart  0-6 Units Subcutaneous Q4H   mouth rinse  15 mL Mouth Rinse Q2H   pantoprazole (PROTONIX) IV  40 mg Intravenous Q12H   polyethylene glycol  17 g Per Tube Daily   sodium chloride flush  10 mL Intrapleural Q8H   sodium chloride flush  10-40 mL Intracatheter Q12H   sodium chloride flush  3 mL Intravenous Q12H    Infusions:  sodium chloride 10 mL/hr at 02/06/22 0600   sodium chloride Stopped (02/03/22 2325)   sodium chloride     dexmedetomidine (PRECEDEX) IV infusion 1 mcg/kg/hr (02/06/22 0600)   DOBUTamine 3 mcg/kg/min (  02/06/22 0600)   feeding supplement (PEPTAMEN 1.5 CAL) 50 mL/hr at 02/06/22 0600   fentaNYL infusion INTRAVENOUS 100 mcg/hr (02/06/22 0600)   magnesium sulfate bolus IVPB 50 mL/hr at 02/06/22 0600   meropenem (MERREM) IV Stopped (02/05/22 2211)   norepinephrine (LEVOPHED) Adult infusion Stopped (02/05/22 1901)    PRN Medications: Place/Maintain arterial line **AND** sodium chloride, sodium chloride, acetaminophen, fentaNYL, ipratropium-albuterol, mouth rinse, sodium chloride flush, sodium chloride flush    Patient Profile  61 y/o AAF w/ h/o HTN, COPD, anemia, h/o poor med compliance and prior admission to [facility] 9/23 for UTI and pyelonephritis, admitted w/ mixed septic and cardiogenic shock.    Assessment/Plan   1. Shock: Suspect mixed septic shock and cardiogenic shock.  Lactate was > 9 with PCT > 150.  E coli bacteremia  with echo showing EF 20-25%.  Remains on dobutamine 3 mcg/kg/min and off NE, MAP stable.  Co-ox 86%.  - Meropenem for broad spectrum coverage.   - Decrease dobutamine to 2 today with excellent co-ox.  2. Acute systolic CHF -> cardiogenic shock: Echo showed EF 20-25% with preservation of apical function but severe hypokinesis of other walls, normal RV, dilated IVC. Unusual wall motion abnormality pattern, prior echo was normal. ?Septic cardiomyopathy with reverse Takotsubo-type pattern.  HS-TnI was minimally elevated with no trend so doubt ACS. Co-ox 86% with CVP 3.  Creatinine down to 1.88.  MAP stable, off NE.   - Decrease dobutamine to 2.  - Volume status ok. Holding diuretics for now  - Would plan for LHC/RHC if/when creatinine/plts improved and mental status improved to rule out CAD.  3. AKI: In setting of septic shock.  Creatinine trending down slowly.  4. ID: E coli bacteremia.  PCT > 150 initially. WBCs up to 24.5 today.  Also found to have Enterobacter cloacae in trach aspirate.  Febrile to 101.1 last night, has been having persistent fevers.    - Continue meropenem.  5. Anemia: Hgb 8.3 today had 1 unit PRBCs yesterday.  6. Thrombocytopenia: Platelets rising, up to 57K today. Suspect fall was due to critical illness/sepsis/inflammation. 7. Acute hypoxemic respiratory failure: PNA on CXR, also suspect pulmonary edema. Intubated.  She has a pre-existing history of COPD.  - If we continue to press forwards aggressively will need tracheostomy.  8. Ischemic digits bilateral hands: Palpable brachial pulses, no palpable ulnar or radial pulses.  Ischemic digits with no flow on duplex US at wrists.  Suspect pressor-induced. High risk for auto-amputation.  - Platelets rising, will start heparin gtt today.  9. Neuro: Eyes are open but no purposeful movement.  10. Hypernatremia: Add free water boluses.  11. Goals of care: Critical illness with MSOF.  I am concerned that even if she survives this  episode, quality of life will be poor.  She will likely lose fingers and is probably looking at trach/PEG if she survives.  - Family meeting today for goals of care.    CRITICAL CARE Performed by: Marca Ancona  Total critical care time: 40 minutes  Critical care time was exclusive of separately billable procedures and treating other patients.  Critical care was necessary to treat or prevent imminent or life-threatening deterioration.  Critical care was time spent personally by me (independent of midlevel providers or residents) on the following activities: development of treatment plan with patient and/or surrogate as well as nursing, discussions with consultants, evaluation of patient's response to treatment, examination of patient, obtaining history from patient or surrogate, ordering and performing treatments and  interventions, ordering and review of laboratory studies, ordering and review of radiographic studies, pulse oximetry and re-evaluation of patient's condition.   Length of Stay: 8  Marca Ancona, MD  02/06/2022, 7:50 AM  Advanced Heart Failure Team Pager [phone] (M-F; 7a - 5p)  Please contact [facility] for night-coverage after hours (5p -7a ) and weekends on [email or web address]

## 2022-02-06 NOTE — Progress Notes (Signed)
ANTICOAGULATION CONSULT NOTE - Initial Consult  Pharmacy Consult for Heparin Indication: ischemic digits  No Active Allergies  Patient Measurements: Height: 5\' 4"  (162.6 cm) Weight: 48.1 kg (106 lb 0.7 oz) IBW/kg (Calculated) : 54.7 Heparin Dosing Weight: 48 kg  Vital Signs: Temp: 98.5 F (36.9 C) (11/14 0315) Temp Source: Oral (11/14 0315) BP: 122/71 (11/14 0700) Pulse Rate: 116 (11/14 0700)  Labs: Recent Labs    02/04/22 0209 02/04/22 1133 02/05/22 0202 02/05/22 1950 02/06/22 0219  HGB 7.5*  --  7.2* 8.2* 8.3*  HCT 22.4*  --  21.7* 25.4* 24.6*  PLT 28*  --  50*  --  57*  CREATININE  --  2.02* 1.90*  --  1.88*    Estimated Creatinine Clearance: 23.3 mL/min (A) (by C-G formula based on SCr of 1.88 mg/dL (H)).   Medical History: Past Medical History:  Diagnosis Date   Bronchitis    COPD (chronic obstructive pulmonary disease) (HCC)    Hypertension     Assessment: 61 yo female presented on 11/6 with AMS found to have e coli bacteremia and mixed cardiogenic/septic shock. Course complicated by ischemic fingers and vascular consulted but unable to use IV heparin due to thrombocytopenia. Noted dark tarry stools and noted somewhat bloody smears yesterday per RN. Pharmacy consulted to dose heparin with low goal and no bolus now that plts >50. Hgb 8.3 (s/p transfusion) and plts up to 57 now.   Goal of Therapy:  Heparin level 0.3-0.5 units/ml Monitor platelets by anticoagulation protocol: Yes   Plan:  Start heparin 650 units/hr No bolus per HF/CCM Check 8 hr heparin level  Follow up signs of bleeding very closely  Monitor heparin level, CBC and s/s of bleeding dialy    13/6, PharmD, BCPS Clinical Pharmacist 02/06/2022 7:49 AM

## 2022-02-06 NOTE — Progress Notes (Signed)
[facility]  Subjective  -remains intubated and critically ill in the ICU.  Dobutamine at 3.  Levophed off since yesterday.   Objective (!) 140/76 (!) 120 (!) 100.7 F (38.2 C) (Axillary) (!) 23 98%  Intake/Output Summary (Last 24 hours) at 02/06/2022 0850 Last data filed at 02/06/2022 0836 Gross per 24 hour  Intake 2728.28 ml  Output 1593 ml  Net 1135.28 ml    No doppler signals in bilateral upper extremities at the wrist, brachial signals bilaterally at antecubital region Ischemic changes to all 5 digits in both hands, dry  Laboratory Lab Results: Recent Labs    02/05/22 0202 02/05/22 1950 02/06/22 0219  WBC 22.4*  --  24.5*  HGB 7.2* 8.2* 8.3*  HCT 21.7* 25.4* 24.6*  PLT 50*  --  57*   BMET Recent Labs    02/05/22 0202 02/06/22 0219  NA 147* 152*  K 3.5 3.9  CL 116* 124*  CO2 23 22  GLUCOSE 222* 191*  BUN 41* 44*  CREATININE 1.90* 1.88*  CALCIUM 7.1* 7.4*    COAG Lab Results  Component Value Date   INR 2.2 (H) 01/29/2022   INR 1.8 (H) 01/28/2022   INR 1.1 04/18/2020   No results found for: "PTT"  Assessment/Planning:  61 year old female that vascular surgery was consulted over the weekend after having 2 to 3 days of ischemic changes to bilateral upper hands.  She is intubated and critically ill in the ICU with septic and cardiogenic shock.  She has been on multiple pressors.  Duplex of her bilateral upper extremities on Sunday showed triphasic flow down to the distal brachial artery in both arms with no thrombus.  There was no flow seen in the radial and ulnar arteries in both upper extremities.  Felt to be related to low flow state as well as pressors.  Remains exceedingly high risk for finger and hand amputations bilaterally.  Plan palliative care meeting today which I agree.  We discussed heparin over the weekend with critical care but she was too high risk given her platelet count of 20 and dropping hemoglobin.   Appears critical care is okay starting heparin today pending family discussion.  Cephus Shelling 02/06/2022 8:50 AM --

## 2022-02-06 NOTE — Progress Notes (Signed)
ANTICOAGULATION CONSULT NOTE - Initial Consult  Pharmacy Consult for Heparin Indication: ischemic digits  No Active Allergies  Patient Measurements: Height: 5\' 4"  (162.6 cm) Weight: 48.1 kg (106 lb 0.7 oz) IBW/kg (Calculated) : 54.7 Heparin Dosing Weight: 48 kg  Vital Signs: Temp: 99.5 F (37.5 C) (11/14 1155) Temp Source: Oral (11/14 1155) BP: 117/65 (11/14 1800) Pulse Rate: 103 (11/14 1800)  Labs: Recent Labs    02/04/22 0209 02/04/22 1133 02/05/22 0202 02/05/22 1950 02/06/22 0219 02/06/22 1644  HGB 7.5*  --  7.2* 8.2* 8.3*  --   HCT 22.4*  --  21.7* 25.4* 24.6*  --   PLT 28*  --  50*  --  57*  --   HEPARINUNFRC  --   --   --   --   --  <0.10*  CREATININE  --  2.02* 1.90*  --  1.88*  --      Estimated Creatinine Clearance: 23.3 mL/min (A) (by C-G formula based on SCr of 1.88 mg/dL (H)).   Medical History: Past Medical History:  Diagnosis Date   Bronchitis    COPD (chronic obstructive pulmonary disease) (HCC)    Hypertension     Assessment: 61 yo female presented on 11/6 with AMS found to have e coli bacteremia and mixed cardiogenic/septic shock. Course complicated by ischemic fingers and vascular consulted but unable to use IV heparin due to thrombocytopenia. Noted dark tarry stools and noted somewhat bloody smears yesterday per RN. Pharmacy consulted to dose heparin with low goal and no bolus now that plts >50. Hgb 8.3 (s/p transfusion) and plts up to 57 now.   Heparin came undetectable. We will increase rate and check level in 6 hr.  Goal of Therapy:  Heparin level 0.3-0.5 units/ml Monitor platelets by anticoagulation protocol: Yes   Plan:  Increase heparin 750 units/hr No bolus per HF/CCM Check 8 hr heparin level  Follow up signs of bleeding very closely  Monitor heparin level, CBC and s/s of bleeding dialy    13/6, PharmD, BCPS Clinical Pharmacist 02/06/2022 6:55 PM

## 2022-02-07 DIAGNOSIS — I998 Other disorder of circulatory system: Secondary | ICD-10-CM

## 2022-02-07 LAB — GLUCOSE, CAPILLARY
Glucose-Capillary: 180 mg/dL — ABNORMAL HIGH (ref 70–99)
Glucose-Capillary: 154 mg/dL — ABNORMAL HIGH (ref 70–99)
Glucose-Capillary: 152 mg/dL — ABNORMAL HIGH (ref 70–99)
Glucose-Capillary: 137 mg/dL — ABNORMAL HIGH (ref 70–99)
Glucose-Capillary: 142 mg/dL — ABNORMAL HIGH (ref 70–99)
Glucose-Capillary: 143 mg/dL — ABNORMAL HIGH (ref 70–99)

## 2022-02-07 LAB — CBC
HCT: 22.7 % — ABNORMAL LOW (ref 36.0–46.0)
Hemoglobin: 7.8 g/dL — ABNORMAL LOW (ref 12.0–15.0)
MCH: 25.5 pg — ABNORMAL LOW (ref 26.0–34.0)
MCHC: 34.4 g/dL (ref 30.0–36.0)
MCV: 74.2 fL — ABNORMAL LOW (ref 80.0–100.0)
Platelets: 67 10*3/uL — ABNORMAL LOW (ref 150–400)
RBC: 3.06 MIL/uL — ABNORMAL LOW (ref 3.87–5.11)
RDW: 29.5 % — ABNORMAL HIGH (ref 11.5–15.5)
WBC: 21.3 10*3/uL — ABNORMAL HIGH (ref 4.0–10.5)
nRBC: 0.1 % (ref 0.0–0.2)

## 2022-02-07 LAB — CULTURE, BODY FLUID W GRAM STAIN -BOTTLE: Culture: NO GROWTH

## 2022-02-07 LAB — BASIC METABOLIC PANEL
Anion gap: 6 (ref 5–15)
BUN: 43 mg/dL — ABNORMAL HIGH (ref 8–23)
CO2: 21 mmol/L — ABNORMAL LOW (ref 22–32)
Calcium: 7.2 mg/dL — ABNORMAL LOW (ref 8.9–10.3)
Chloride: 121 mmol/L — ABNORMAL HIGH (ref 98–111)
Creatinine, Ser: 1.79 mg/dL — ABNORMAL HIGH (ref 0.44–1.00)
GFR, Estimated: 31 mL/min — ABNORMAL LOW (ref >60)
Glucose, Bld: 188 mg/dL — ABNORMAL HIGH (ref 70–99)
Potassium: 3.4 mmol/L — ABNORMAL LOW (ref 3.5–5.1)
Sodium: 148 mmol/L — ABNORMAL HIGH (ref 135–145)

## 2022-02-07 LAB — COOXEMETRY PANEL
Carboxyhemoglobin: 0.9 % (ref 0.5–1.5)
Methemoglobin: 1.3 % (ref 0.0–1.5)
O2 Saturation: 77.7 %
Total hemoglobin: 8.3 g/dL — ABNORMAL LOW (ref 12.0–16.0)

## 2022-02-07 LAB — HEPARIN LEVEL (UNFRACTIONATED)
Heparin Unfractionated: <0.1 IU/mL — ABNORMAL LOW (ref 0.30–0.70)
Heparin Unfractionated: <0.1 IU/mL — ABNORMAL LOW (ref 0.30–0.70)

## 2022-02-07 MED ORDER — SODIUM CHLORIDE 0.9% FLUSH: 10.0000 mL | Freq: Two times a day (BID) | INTRAVENOUS | Status: DC

## 2022-02-07 MED ORDER — SODIUM CHLORIDE 0.9% FLUSH: 10.0000 mL | INTRAVENOUS | Status: DC | PRN

## 2022-02-07 MED ORDER — POTASSIUM CHLORIDE 10 MEQ/50ML IV SOLN
10.0000 meq | INTRAVENOUS | Status: AC
  Administered 2022-02-07 (×4): 10 meq via INTRAVENOUS
  Filled 2022-02-07 (×4): qty 50

## 2022-02-07 MED ORDER — POTASSIUM CHLORIDE 10 MEQ/100ML IV SOLN: 10.0000 meq | INTRAVENOUS | Status: DC

## 2022-02-07 NOTE — Progress Notes (Addendum)
Patient ID: Nicole Cunningham, female   DOB: [birth year], 61 y.o.   MRN: [id number]   Advanced Heart Failure Rounding Note  PCP-Cardiologist: None   Subjective:    Remains intubated.  Eyes open but still not following commands.   On DBA 2. NE added back for hypotension, currently on 10 mcg. Co-ox 78%. CVP 2   Bronchoscopy performed 11/14 for worsening PNA>>Copious amount of tenacious secretions noted in the RM/RL/LLL, suctioned and BAL was performed. Cultures pending.   Remains on meropenum. WBC 24>>21K.  On heparin gtt. Plt 57>>67K. Hgb 8.3>>7.8  Na 152>>148  SCr 1.90>>1.88>>1.79 today   Palliative care team following. GOC discussion ongoing. Made DNR. Family meeting again today at 9am.   Objective:   Weight Range: 48.1 kg Body mass index is 18.2 kg/m.   Vital Signs:   Temp:  [98.7 F (37.1 C)-100.7 F (38.2 C)] 99.2 F (37.3 C) (11/14 2316) Pulse Rate:  [90-134] 104 (11/15 0700) Resp:  [15-33] 21 (11/15 0700) BP: (81-202)/(42-104) 119/63 (11/15 0630) SpO2:  [89 %-100 %] 100 % (11/15 0700) FiO2 (%):  [50 %] 50 % (11/15 0400) Weight:  [48.1 kg] 48.1 kg (11/15 0131) Last BM Date : 02/06/22  Weight change: Filed Weights   02/06/22 0327 02/06/22 0800 02/07/22 0131  Weight: 48.1 kg 48.1 kg 48.1 kg    Intake/Output:   Intake/Output Summary (Last 24 hours) at 02/07/2022 0713 Last data filed at 02/07/2022 0600 Gross per 24 hour  Intake 2078.81 ml  Output 3245 ml  Net -1166.19 ml      Physical Exam   General: ill appearing, intubated.  HEENT: normal + ETT  Neck: supple. no JVD. Carotids 2+ bilat; no bruits. No lymphadenopathy or thyromegaly appreciated. Cor: PMI nondisplaced. Regular rhythm, tachy rate. No rubs, gallops or murmurs. Lungs: clear + ETT  Abdomen: soft, nontender, nondistended. No hepatosplenomegaly. No bruits or masses. Good bowel sounds. Extremities: no cyanosis, clubbing, rash, edema, ischemic digits on hands Neuro: intubated, intermittently  following commands   Telemetry   Sinus tachy 110s, 14 beat run NSVT  Personally reviewed  Labs    CBC Recent Labs    02/06/22 0219 02/07/22 0310  WBC 24.5* 21.3*  NEUTROABS 21.6*  --   HGB 8.3* 7.8*  HCT 24.6* 22.7*  MCV 75.0* 74.2*  PLT 57* 67*   Basic Metabolic Panel Recent Labs    02/06/22 0219 02/07/22 0310  NA 152* 148*  K 3.9 3.4*  CL 124* 121*  CO2 22 21*  GLUCOSE 191* 188*  BUN 44* 43*  CREATININE 1.88* 1.79*  CALCIUM 7.4* 7.2*  MG 1.3*  --    Hemoglobin A1C No results for input(s): "HGBA1C" in the last 72 hours. Imaging    DG CHEST PORT 1 VIEW  Result Date: 02/06/2022 CLINICAL DATA:  Follow-up ventilator support EXAM: PORTABLE CHEST 1 VIEW COMPARISON:  Earlier same day FINDINGS: Endotracheal tube tip 3 cm above the carina. Orogastric or nasogastric tube enters the stomach. Right internal jugular central line tip at the SVC RA junction. Left chest tube in place. Edema pattern has worsened slightly. Worsened volume loss in the left lower lobe. IMPRESSION: Lines and tubes well positioned. Worsened edema pattern. Worsened volume loss in the left lower lobe. Electronically Signed   By: Nelson Chimes M.D.   On: 02/06/2022 12:35     Medications:     Scheduled Medications:  arformoterol  15 mcg Nebulization BID   budesonide (PULMICORT) nebulizer solution  0.25 mg Nebulization BID  Chlorhexidine Gluconate Cloth  6 each Topical Q0600   docusate  100 mg Per Tube BID   fentaNYL (SUBLIMAZE) injection  50 mcg Intravenous Once   free water  200 mL Per Tube Q6H   furosemide  40 mg Intravenous Q12H   insulin aspart  0-6 Units Subcutaneous Q4H   mouth rinse  15 mL Mouth Rinse Q2H   pantoprazole (PROTONIX) IV  40 mg Intravenous Q12H   polyethylene glycol  17 g Per Tube Daily   sodium chloride flush  10 mL Intrapleural Q8H   sodium chloride flush  10-40 mL Intracatheter Q12H   sodium chloride flush  3 mL Intravenous Q12H    Infusions:  sodium chloride 10 mL/hr  at 02/07/22 0400   sodium chloride Stopped (02/03/22 2325)   sodium chloride     dexmedetomidine (PRECEDEX) IV infusion 1 mcg/kg/hr (02/07/22 0400)   DOBUTamine 2 mcg/kg/min (02/07/22 0400)   feeding supplement (PEPTAMEN 1.5 CAL) 50 mL/hr at 02/07/22 0400   fentaNYL infusion INTRAVENOUS 250 mcg/hr (02/07/22 0519)   heparin 850 Units/hr (02/07/22 0438)   meropenem (MERREM) IV Stopped (02/06/22 2158)   norepinephrine (LEVOPHED) Adult infusion 4 mcg/min (02/07/22 0500)    PRN Medications: Place/Maintain arterial line **AND** sodium chloride, sodium chloride, acetaminophen, fentaNYL, ipratropium-albuterol, lip balm, mouth rinse, sodium chloride flush, sodium chloride flush    Patient Profile   61 y/o AAF w/ h/o HTN, COPD, anemia, h/o poor med compliance and prior admission to [facility] 9/23 for UTI and pyelonephritis, admitted w/ mixed septic and cardiogenic shock.   Assessment/Plan   1. Shock: Suspect mixed septic shock and cardiogenic shock.  Lactate was > 9 with PCT > 150.  E coli bacteremia with echo showing EF 20-25%.  Remains on dobutamine 2 mcg/kg/min and NE 10. Co-ox 78%.  - Meropenem for broad spectrum coverage.   - Further wean DBA to off. Follow co-ox  2. Acute systolic CHF -> cardiogenic shock: Echo showed EF 20-25% with preservation of apical function but severe hypokinesis of other walls, normal RV, dilated IVC. Unusual wall motion abnormality pattern, prior echo was normal. ?Septic cardiomyopathy with reverse Takotsubo-type pattern.  HS-TnI was minimally elevated with no trend so doubt ACS. Co-ox 78% with CVP 2.  Creatinine down to 1.79.   -Wean off DBA  - Volume status ok. Holding diuretics for now  - Would plan for LHC/RHC if/when creatinine/plts improved and mental status improved to rule out CAD.  3. AKI: In setting of septic shock.  Creatinine trending down slowly.  4. ID: E coli bacteremia.  PCT > 150 initially. WBCs up to 24.5 today.  Also found to have Enterobacter cloacae  in trach aspirate.  Has been having persistent fevers.    - Continue meropenem.  5. Anemia: Hgb 7.8 today - transfuse hgb < 7 6. Thrombocytopenia: Platelets rising, up to 67K today. Suspect fall was due to critical illness/sepsis/inflammation. 7. Acute hypoxemic respiratory failure: PNA on CXR, also suspect pulmonary edema. Intubated.  She has a pre-existing history of COPD.  - If we continue to press forwards aggressively will need tracheostomy.  8. Ischemic digits bilateral hands: Palpable brachial pulses, no palpable ulnar or radial pulses.  Ischemic digits with no flow on duplex US at wrists.  Suspect pressor-induced. High risk for auto-amputation.  - Continue heparin gtt  9. Neuro: Eyes are open but no purposeful movement.  10. Hypernatremia: Improving, Na 152>>148 - continue free water boluses.  11. Goals of care: Critical illness with MSOF.  I  am concerned that even if she survives this episode, quality of life will be poor.  She will likely lose fingers and is probably looking at trach/PEG if she survives. Now DNR - Family meeting today for goals of care.     Length of Stay: [address], PA-C  02/07/2022, 7:13 AM  Advanced Heart Failure Team Pager [phone] (M-F; 7a - 5p)  Please contact [facility] for night-coverage after hours (5p -7a ) and weekends on [email or web address]  Patient seen with PA, agree with the above note.   She is on dobutamine 2, NE 8 this morning.  Co-ox 78%.  CVP 4 on my read.  I/Os net negative yesterday, had dose of IV Lasix.   Bronch done yesterday, copious secretions.  BAL done.   Na lower at 148, creatinine down to 1.79.   Tm 100.8, remains on meropenem.    She does shut her eyes on command. Awake this morning.   General: Intubated.  Neck: No JVD, no thyromegaly or thyroid nodule.  Lungs: Clear to auscultation bilaterally with normal respiratory effort. CV: Nondisplaced PMI.  Heart regular S1/S2, no S3/S4, no murmur.  No peripheral edema.     Abdomen: Soft, nontender, no hepatosplenomegaly, no distention.  Skin: Intact without lesions or rashes.  Neurologic: Awake, will shut eyes on command.   Extremities: Ischemic digits on hands.  HEENT: Normal.   She is back on low dose NE, titrating back down.  Suspect mixed septic/cardiogenic shock (still with low grade fevers).  Co-ox 78% - Wean NE as able.  - OK to decrease dobutamine to 1 but would not stop until she is also off NE.   Volume status looks ok, CVP 4 this morning.  Think we can hold off on diuretic today.  Creatinine trending down slowly.   She has ESBL Enterobacter PNA and had E coli UTI with bacteremia.  - Continue meropenem.   She remains intubated, FiO2 0.5.  If we proceed with aggressive care, will need tracheostomy.   She does appear to be able to follow commands now (closes eyes to commands).   She is now on heparin gtt for ischemic digits.  Plts higher at 67 K today.   No overt bleeding, hgb lower at 7.8.  Would transfuse hgb < 7.5.   CRITICAL CARE Performed by: Loralie Champagne  Total critical care time: 40 minutes  Critical care time was exclusive of separately billable procedures and treating other patients.  Critical care was necessary to treat or prevent imminent or life-threatening deterioration.  Critical care was time spent personally by me on the following activities: development of treatment plan with patient and/or surrogate as well as nursing, discussions with consultants, evaluation of patient's response to treatment, examination of patient, obtaining history from patient or surrogate, ordering and performing treatments and interventions, ordering and review of laboratory studies, ordering and review of radiographic studies, pulse oximetry and re-evaluation of patient's condition.  Loralie Champagne 02/07/2022 9:42 AM

## 2022-02-07 NOTE — Progress Notes (Signed)
NAME:  Nicole Cunningham, MRN:  [id number], DOB:  Aug 23, 1960, LOS: 82 ADMISSION DATE:  01/28/2022, CONSULTATION DATE:  11/6 REFERRING MD:  Dr. Sedonia Small, CHIEF COMPLAINT:  septic shock   History of Present Illness:  Patient is a 61 year old female with pertinent PMH of COPD, anemia, HTN presents to [facility] ED on 11/6 with AMS.  Patient recently admitted to [facility] with AMS on 11/2021.  Found to have UTI and pyelonephritis.  On 11/5, patient having increased confusion.  Having some SOB over the past couple of days.  Brought to [facility] ED for further eval.  Upon arrival to [facility] ED, patient confused but able to state name.  Admitted with shock, AKI, lactic acidosis of 9.0  Pertinent ED labs: CO2 14, BUN 51, creat 4.13, AST 52, alk phos 159, AG 23, WBC 1.3, Hgb 9.3, platelets 70, troponin 64 then 80.  CT head without acute abnormality.  CXR no significant findings.  CT abdomen pelvis 17 mm nonobstructive renal calculus without hydronephrosis.  Found to have E. coli bacteremia and EF 20 to 25% on echo, mixed cardiogenic and septic shock  Pertinent  Medical History   Past Medical History:  Diagnosis Date   Bronchitis    COPD (chronic obstructive pulmonary disease) ([place])    Hypertension      Significant Hospital Events: Including procedures, antibiotic start and stop dates in addition to other pertinent events   11/6 septic shock on levo , ETT >> 11/6 Echo EF 20 to 25% global hypokinesis, apex intact 11/6 RIJ HD cath >> dobutamine at 5 mics 11/7>>Dobutamine increased to 7.5 mics , 1 unit PRBC and 1 unit platelet transfusion 11/8 dobutamine increased to 10 mics 11/10 dobut wean, diuretic, pigtail  11/13 restarted levophed overnight , Short run of SVT, weaned Dobutamine to 3 , repleted K, 1 Unit PRBC per cards ( HGB 7.2)  Interim History / Subjective:  Patient became hypotensive overnight, required reinitiation of Levophed Spiked fever with Tmax of 100.8 Remains on dobutamine at 2 CVP this morning was  around 4   Objective   Blood pressure 119/62, pulse (!) 104, temperature (!) 100.8 F (38.2 C), resp. rate (!) 21, height _0  (1.626 m), weight 48.1 kg, SpO2 100 %. CVP:  [1 mmHg-7 mmHg] 1 mmHg  Vent Mode: PRVC FiO2 (%):  [50 %] 50 % Set Rate:  [15 bmp] 15 bmp Vt Set:  [430 mL] 430 mL PEEP:  [5 cmH20] 5 cmH20 Delta P (Amplitude):  [10] 10 Plateau Pressure:  [6 cmH20-19 cmH20] 6 cmH20   Intake/Output Summary (Last 24 hours) at 02/07/2022 0835 Last data filed at 02/07/2022 0800 Gross per 24 hour  Intake 1838.14 ml  Output 3365 ml  Net -1526.86 ml   Filed Weights   02/06/22 0327 02/06/22 0800 02/07/22 0131  Weight: 48.1 kg 48.1 kg 48.1 kg    Examination:   Physical exam: General: Crtitically ill-appearing female, orally intubated HEENT: [place]/AT, eyes anicteric.  ETT and OGT in place Neuro: Opens and closes eyes to vocal stimuli, not following other commands, pupils 4 mm bilateral reactive to light Chest: Bilateral rhonchorous breath sounds, no wheezes Heart: Tachycardic, regular rhythm, no murmurs or gallops Abdomen: Soft, nontender, nondistended, bowel sounds present Skin: Bilateral finger/gangrene   Coox 78% on dobut 2, dropping Dobutamine to 1 K 3.4, serum sodium 148 BUN 43, Creatinine 1.79 HGB 7.8 WBC 21.3 Platelets 67k  Resolved Hospital Problem list     Assessment & Plan:  Mixed cardiogenic and septic shock:  with suspicion for sepsis cardiomyopathy.  E coli UTI E coli bacteremia AKI due to septic ATN Pancytopenia- sepsis related Septic encephalopathy COPD: On Breo Ellipta Acute hypoxic respiratory failure due to multifocal pna caused by ESBL enterobacter  L parapneumonic effusion- s/p pigtail in place Nonobstructing ureteral stone- doubt contributing to current clinical picture Ischemic upper ext with dry gangrene- POA, worse on R, ongoing thrombocytopenia and worsening anemia precludes most interventions, unfortunately may  autoamputate Hypokalemia/hypernatremia  Continue broad-spectrum antibiotics with meropenem Fever spikes are coming down, white count trended down from 24-21 Overnight became hypotensive, restarted back on Levophed, now titrating down with map goal 65 Continue dobutamine at 1 per heart failure team Will stop suction on chest tube, leave it under waterseal and probably discontinue tomorrow Bronchoscopy was performed yesterday, showing copious amount of tenacious secretions in bilateral lower lobe and right middle lobe Post bronc x-ray chest showed worsening bilateral infiltrates Oxygenation has improved, FiO2 was titrated down to 40% and PEEP remains at 5 Outpatient follow-up with urology Continue lung protective ventilation Patient CVP is 4, will stop Lasix Continue Precedex and fentanyl, RASS goal -1 VAP prevention bundle Appreciate vascular surgery follow-up, recommend continuing low-dose heparin Goals of care discussions are ongoing, palliative care is following Closely monitor electrolytes and supplement   Best Practice (right click and "Reselect all SmartList Selections" daily)   Diet/type: tubefeeds and NPO, tube feeds DVT prophylaxis: Systemic heparin GI prophylaxis: PPI Lines: Central line and yes and it is still needed Foley:  Yes, and it is still needed Code Status:  DNR Last date of multidisciplinary goals of care discussion [11/14 updated patient's family at bedside, they had meeting with palliative care, patient was made DNR but full scope of treatment currently.    Total critical care time: 39 minutes  Performed by: [place] care time was exclusive of separately billable procedures and treating other patients.   Critical care was necessary to treat or prevent imminent or life-threatening deterioration.   Critical care was time spent personally by me on the following activities: development of treatment plan with patient and/or surrogate as well as  nursing, discussions with consultants, evaluation of patient's response to treatment, examination of patient, obtaining history from patient or surrogate, ordering and performing treatments and interventions, ordering and review of laboratory studies, ordering and review of radiographic studies, pulse oximetry and re-evaluation of patient's condition.   Jacky Kindle, MD [facility] See Amion for pager If no response to pager, please call [phone] until 7pm After 7pm, Please call E-link [phone]

## 2022-02-07 NOTE — Progress Notes (Signed)
ANTICOAGULATION CONSULT NOTE - Follow Up Consult  Pharmacy Consult for Heparin Indication: ischemic digits  No Active Allergies  Patient Measurements: Height: 5\' 4"  (162.6 cm) Weight: 48.1 kg (106 lb 0.7 oz) IBW/kg (Calculated) : 54.7 Heparin Dosing Weight: 48 kg  Vital Signs: Temp: 100.8 F (38.2 C) (11/15 0746) BP: 150/73 (11/15 1300) Pulse Rate: 95 (11/15 1300)  Labs: Recent Labs    02/05/22 0202 02/05/22 1950 02/06/22 0219 02/06/22 1644 02/07/22 0310 02/07/22 1320  HGB 7.2* 8.2* 8.3*  --  7.8*  --   HCT 21.7* 25.4* 24.6*  --  22.7*  --   PLT 50*  --  57*  --  67*  --   HEPARINUNFRC  --   --   --  <0.10* <0.10* <0.10*  CREATININE 1.90*  --  1.88*  --  1.79*  --      Estimated Creatinine Clearance: 24.4 mL/min (A) (by C-G formula based on SCr of 1.79 mg/dL (H)).   Medical History: Past Medical History:  Diagnosis Date   Bronchitis    COPD (chronic obstructive pulmonary disease) (HCC)    Hypertension     Assessment: 61 yo female presented on 11/6 with AMS found to have e coli bacteremia and mixed cardiogenic/septic shock. Course complicated by ischemic fingers and vascular consulted but unable to use IV heparin due to thrombocytopenia. Noted dark tarry stools and noted somewhat bloody smears on 11/13 per RN. Pharmacy consulted to dose heparin with low goal and no bolus now that plts >50.  Heparin level undetectable on heparin 850 units/hr. No issues with IV infusion or access. Level drawn appropriately. No BMs with dayshift RN and no signs of bleeding. Noted tarry bowel movement overnight.   Goal of Therapy:  Heparin level 0.3-0.5 units/ml Monitor platelets by anticoagulation protocol: Yes   Plan:  Increase heparin to 950 units/hr.  No bolus per HF/CCM Check 8 hr heparin level  Follow up signs of bleeding very closely  Monitor heparin level, CBC and s/s of bleeding dialy    12/13, PharmD, BCPS Clinical Pharmacist 02/07/2022 2:35 PM

## 2022-02-07 NOTE — Progress Notes (Signed)
  Daily Progress Note   Patient Name: Nicole Cunningham       Date: 02/07/2022 DOB: 1960-07-31  Age: 61 y.o. MRN#: [id number] Attending Physician: Cheri Fowler, MD Primary Care Physician: [name], MD Admit Date: 01/28/2022 Length of Stay: 9 days  Reason for Consultation/Follow-up: {Reason for Consult:23484}  HPI/Patient Profile:  ***  Subjective:   Subjective: Chart Reviewed. Updates received. Patient Assessed. Created space and opportunity for patient  and family to explore thoughts and feelings regarding current medical situation.  Today's Discussion: ***  Review of Systems  Objective:   Vital Signs:  BP (!) 150/73   Pulse 95   Temp (!) 100.8 F (38.2 C)   Resp (!) 22   Ht 5\' 4"  (1.626 m)   Wt 48.1 kg   SpO2 98%   BMI 18.20 kg/m   Physical Exam: Physical Exam  Palliative Assessment/Data: ***    Existing Vynca/ACP Documentation: ***  Assessment & Plan:   Impression: Present on Admission:  Septic shock (HCC)  ***  SUMMARY OF RECOMMENDATIONS   ***  Symptom Management:  ***  Code Status: {Palliative Code status:23503}  Prognosis: {Palliative Care Prognosis:23504}  Discharge Planning: {Palliative dispostion:23505}  Discussed with: ***  Thank you for allowing to participate in the care of Nicole Cunningham PMT will continue to support holistically.  Time Total: ***  Visit consisted of counseling and education dealing with the complex and emotionally intense issues of symptom management and palliative care in the setting of serious and potentially life-threatening illness. Greater than 50%  of this time was spent counseling and coordinating care related to the above assessment and plan.  07-01-2001, NP Palliative Medicine Team  Team Phone # [phone] (Nights/Weekends)  11/22/2020, 8:17 AM

## 2022-02-07 NOTE — Progress Notes (Signed)
[facility] [place] PROGRESS NOTE  ASSESSMENT / PLAN: Nicole Cunningham is a 61 y.o. female with global peripheral ischemia in setting of critical illness. No options for revascularization at this time.  Recommend supportive care with heparin, active warming, weaning from vasopressors, etc. Will allow ischemia to demarcate. Agree with ongoing goals of care discussions. Comfort measures would probably be best for her. Will follow.   SUBJECTIVE: Intubated and sedated.  OBJECTIVE: BP (!) 150/73   Pulse 95   Temp (!) 100.8 F (38.2 C)   Resp (!) 22   Ht 5\' 4"  (1.626 m)   Wt 48.1 kg   SpO2 98%   BMI 18.20 kg/m   Intake/Output Summary (Last 24 hours) at 02/07/2022 1431 Last data filed at 02/07/2022 1000 Gross per 24 hour  Intake 1811.18 ml  Output 1765 ml  Net 46.18 ml    Chronically ill woman. Appears older than stated age. Endotracheal tube in place. No dysnchrony with vent.  Tachycardia. Distal digital gangrene with ischemic changes about the hands and wrists.     Latest Ref Rng & Units 02/07/2022    3:10 AM 02/06/2022    2:19 AM 02/05/2022    7:50 PM  CBC  WBC 4.0 - 10.5 K/uL 21.3  24.5    Hemoglobin 12.0 - 15.0 g/dL 7.8  8.3  8.2   Hematocrit 36.0 - 46.0 % 22.7  24.6  25.4   Platelets 150 - 400 K/uL 67  57          Latest Ref Rng & Units 02/07/2022    3:10 AM 02/06/2022    2:19 AM 02/05/2022    2:02 AM  CMP  Glucose 70 - 99 mg/dL 02/07/2022  426  834   BUN 8 - 23 mg/dL 43  44  41   Creatinine 0.44 - 1.00 mg/dL 196  2.22  9.79   Sodium 135 - 145 mmol/L 148  152  147   Potassium 3.5 - 5.1 mmol/L 3.4  3.9  3.5   Chloride 98 - 111 mmol/L 121  124  116   CO2 22 - 32 mmol/L 21  22  23    Calcium 8.9 - 10.3 mg/dL 7.2  7.4  7.1   Total Protein 6.5 - 8.1 g/dL  4.4    Total Bilirubin 0.3 - 1.2 mg/dL  2.5    Alkaline Phos 38 - 126 U/L  69    AST 15 - 41 U/L  135    ALT 0 - 44 U/L  10      Estimated Creatinine Clearance: 24.4 mL/min (A) (by C-G  formula based on SCr of 1.79 mg/dL (H)).  8.92. , MD [facility] Phone Number: [phone] 02/07/2022 2:31 PM

## 2022-02-07 NOTE — Progress Notes (Signed)
ANTICOAGULATION CONSULT NOTE - Follow Up Consult  Pharmacy Consult for heparin Indication:  ischemic digits  Labs: Recent Labs    02/05/22 0202 02/05/22 1950 02/06/22 0219 02/06/22 1644 02/07/22 0310  HGB 7.2* 8.2* 8.3*  --  7.8*  HCT 21.7* 25.4* 24.6*  --  22.7*  PLT 50*  --  57*  --  67*  HEPARINUNFRC  --   --   --  <0.10* <0.10*  CREATININE 1.90*  --  1.88*  --  1.79*    Assessment: 61yo female subtherapeutic on heparin with cautious heparin titration; no infusion issues or signs of bleeding per RN.  Goal of Therapy:  Heparin level 0.3-0.5 units/ml   Plan:  Will increase heparin infusion by 2 units/kg/hr to 850 units/hr and check level in 8 hours.    Vernard Gambles, PharmD, BCPS  02/07/2022,4:37 AM

## 2022-02-07 NOTE — Progress Notes (Signed)
Nutrition Follow-up  DOCUMENTATION CODES:   Underweight, Severe malnutrition in context of chronic illness  INTERVENTION:   Continue tube feeds via post-pyloric Cortrak: - Peptamen 1.5 @ 50 ml/hr (1200 ml/day) - Free water per CCM, currently 200 ml q 6 hours  Tube feeding regimen provides 1800 kcal, 82 grams of protein, and 922 ml of H2O.  Total free water with flushes: 1722 ml  NUTRITION DIAGNOSIS:   Severe Malnutrition related to chronic illness (COPD) as evidenced by severe muscle depletion, severe fat depletion.  Ongoing, being addressed via TF  GOAL:   Patient will meet greater than or equal to 90% of their needs  Met via TF at goal rate  MONITOR:   Vent status, Labs, Weight trends, TF tolerance, I & O's  REASON FOR ASSESSMENT:   Consult Enteral/tube feeding initiation and management (trickle tube feeds)  ASSESSMENT:   61 year old female who presented to the ED on 11/05 with AMS. PMH of COPD, anemia, HTN. Pt admitted with septic shock, AKI.  11/06 - intubation 11/07 - trickle TF started 11/08 - TF advanced to goal, Cortrak placed (tip gastric) 11/09 - TF held due to vomiting, OG tube placed to LIWS 11/10 - Cortrak advanced post-pyloric (tip "probably transpyloric in the proximal jejunum"), insertion of chest tube  Discussed pt with RN and during ICU rounds. Pt tolerating tube feeds at goal rate via post-pyloric Cortrak. OG tube is clamped. Vascular Surgery following for global peripheral ischemia. No options for revascularization. Per Vascular Surgery, will allow ischemia to demarcate. [place] discussions with family are ongoing.  Pt with generalized non-pitting edema. Weight up ~7 kg since admission.  Admit weight: 40.8 kg Current weight: 48.1 kg  Patient remains intubated on ventilator support MV: 11.2 L/min Temp (24hrs), Avg:100.1 F (37.8 C), Min:98.7 F (37.1 C), Max:101.3 F (38.5  C)  Drips: Precedex Fentanyl Dobutamine Heparin Levophed  Medications reviewed and include: colace, SSI q 4 hours, IV protonix, miralax, IV abx  Labs reviewed: sodium 148, potassium 3.4 (pt received IV KCl 10 mEq x 4), BUN 43, creatinine 1.79, magnesium 1.3 on 11/14, WBC 21.3, hemoglobin 7.8, platelets 67 CBG's: 122-180 x 24 hours  UOP: 3195 ml x 24 hours Chest tube: 50 ml x 24 hours I/O's: +15.4 L since admit  Diet Order:   Diet Order             Diet NPO time specified  Diet effective now                   EDUCATION NEEDS:   Not appropriate for education at this time  Skin:  Skin Assessment: Reviewed RN Assessment (skin tear to perineum)  Last BM:  02/06/22  Height:   Ht Readings from Last 1 Encounters:  02/06/22 _0  (1.626 m)    Weight:   Wt Readings from Last 1 Encounters:  02/07/22 48.1 kg    Ideal Body Weight:  54.5 kg  BMI:  Body mass index is 18.2 kg/m.  Estimated Nutritional Needs:   Kcal:  1650-1850  Protein:  70-85 grams  Fluid:  1.6-1.8 L    Gustavus Bryant, MS, RD, LDN Inpatient Clinical Dietitian Please see AMiON for contact information.

## 2022-02-08 ENCOUNTER — Inpatient Hospital Stay (HOSPITAL_COMMUNITY): Payer: Self-pay

## 2022-02-08 DIAGNOSIS — R579 Shock, unspecified: Secondary | ICD-10-CM

## 2022-02-08 LAB — CULTURE, RESPIRATORY W GRAM STAIN: Gram Stain: NONE SEEN

## 2022-02-08 LAB — HEPARIN LEVEL (UNFRACTIONATED)
Heparin Unfractionated: 0.12 IU/mL — ABNORMAL LOW (ref 0.30–0.70)
Heparin Unfractionated: 0.19 IU/mL — ABNORMAL LOW (ref 0.30–0.70)
Heparin Unfractionated: 0.21 IU/mL — ABNORMAL LOW (ref 0.30–0.70)

## 2022-02-08 LAB — CBC
HCT: 21.4 % — ABNORMAL LOW (ref 36.0–46.0)
Hemoglobin: 7.3 g/dL — ABNORMAL LOW (ref 12.0–15.0)
MCH: 25.6 pg — ABNORMAL LOW (ref 26.0–34.0)
MCHC: 34.1 g/dL (ref 30.0–36.0)
MCV: 75.1 fL — ABNORMAL LOW (ref 80.0–100.0)
Platelets: 79 10*3/uL — ABNORMAL LOW (ref 150–400)
RBC: 2.85 MIL/uL — ABNORMAL LOW (ref 3.87–5.11)
RDW: 30.5 % — ABNORMAL HIGH (ref 11.5–15.5)
WBC: 20.1 10*3/uL — ABNORMAL HIGH (ref 4.0–10.5)
nRBC: 0 % (ref 0.0–0.2)

## 2022-02-08 LAB — BASIC METABOLIC PANEL
Anion gap: 7 (ref 5–15)
BUN: 51 mg/dL — ABNORMAL HIGH (ref 8–23)
CO2: 21 mmol/L — ABNORMAL LOW (ref 22–32)
Calcium: 7.8 mg/dL — ABNORMAL LOW (ref 8.9–10.3)
Chloride: 121 mmol/L — ABNORMAL HIGH (ref 98–111)
Creatinine, Ser: 1.69 mg/dL — ABNORMAL HIGH (ref 0.44–1.00)
GFR, Estimated: 34 mL/min — ABNORMAL LOW (ref >60)
Glucose, Bld: 180 mg/dL — ABNORMAL HIGH (ref 70–99)
Potassium: 4.5 mmol/L (ref 3.5–5.1)
Sodium: 149 mmol/L — ABNORMAL HIGH (ref 135–145)

## 2022-02-08 LAB — GLUCOSE, CAPILLARY
Glucose-Capillary: 135 mg/dL — ABNORMAL HIGH (ref 70–99)
Glucose-Capillary: 140 mg/dL — ABNORMAL HIGH (ref 70–99)
Glucose-Capillary: 115 mg/dL — ABNORMAL HIGH (ref 70–99)
Glucose-Capillary: 136 mg/dL — ABNORMAL HIGH (ref 70–99)
Glucose-Capillary: 137 mg/dL — ABNORMAL HIGH (ref 70–99)
Glucose-Capillary: 169 mg/dL — ABNORMAL HIGH (ref 70–99)

## 2022-02-08 LAB — COOXEMETRY PANEL
Carboxyhemoglobin: 2.2 % — ABNORMAL HIGH (ref 0.5–1.5)
Methemoglobin: 1.7 % — ABNORMAL HIGH (ref 0.0–1.5)
O2 Saturation: 74.7 %
Total hemoglobin: 7.1 g/dL — ABNORMAL LOW (ref 12.0–16.0)

## 2022-02-08 MED ORDER — ORAL CARE MOUTH RINSE
15.0000 mL | OROMUCOSAL | Status: DC | PRN
  Administered 2022-02-08: 15 mL via OROMUCOSAL

## 2022-02-08 MED ORDER — ORAL CARE MOUTH RINSE
15.0000 mL | OROMUCOSAL | Status: DC
  Administered 2022-02-08 – 2022-02-23 (×57): 15 mL via OROMUCOSAL

## 2022-02-08 NOTE — Procedures (Signed)
Extubation Procedure Note  Patient Details:   Name: Nicole Cunningham DOB: [birth year] MRN: [id number]   Airway Documentation:    Vent end date: 02/08/22 Vent end time: 1050   Evaluation  O2 sats: stable throughout and currently acceptable Complications: No apparent complications Patient did tolerate procedure well. Bilateral Breath Sounds: Rhonchi   Yes Pt extubated per MD order to 4L [place]. Positive cuff leak noted prior to extubation. Pt would not open mouth willingly to allow RT to suction secretions post extubation, however RT was able to gently pull pts mouth open and suction secretions out. Pts family encouraged to remind her to cough and use [place] to clear secretions as she has copious amounts post extubation. Pt able to speak and is asking for water  Carolan Shiver 02/08/2022, 11:01 AM

## 2022-02-08 NOTE — Progress Notes (Addendum)
Patient ID: Nicole Cunningham, female   DOB: January 17, 1961, 61 y.o.   MRN: [id number]   Advanced Heart Failure Rounding Note  PCP-Cardiologist: None   Subjective:    Extubated this morning. Alert and following commands. Difficulty speaking. DNR/DNI but full scope of treatment currently. Family at bedside.   Remains on DBA 1. Co-ox 75%. No CVP set up currently.   Bronchoscopy performed 11/14 for worsening PNA>>Copious amount of tenacious secretions noted in the RM/RL/LLL, suctioned and BAL was performed. Cultures + for Enterobacter Cloacae.   Remains on meropenum. WBC 24>>21>>20K. AF.   On heparin gtt. Plt 57>>67>>79K. Hgb 8.3>>7.8>>7.3  Na 152>>148>>149  SCr 1.90>>1.88>>1.79>>1.69 today   Palliative care team following.    Objective:   Weight Range: 49.6 kg Body mass index is 18.77 kg/m.   Vital Signs:   Temp:  [98 F (36.7 C)-101.3 F (38.5 C)] 98 F (36.7 C) (11/16 0744) Pulse Rate:  [75-127] 85 (11/16 1130) Resp:  [15-31] 24 (11/16 1130) BP: (80-150)/(47-73) 97/49 (11/16 1130) SpO2:  [96 %-100 %] 100 % (11/16 1130) FiO2 (%):  [40 %] 40 % (11/16 0722) Weight:  [49.6 kg] 49.6 kg (11/16 0500) Last BM Date : 02/08/22  Weight change: Filed Weights   02/06/22 0800 02/07/22 0131 02/08/22 0500  Weight: 48.1 kg 48.1 kg 49.6 kg    Intake/Output:   Intake/Output Summary (Last 24 hours) at 02/08/2022 1211 Last data filed at 02/08/2022 1206 Gross per 24 hour  Intake 2440.58 ml  Output 940 ml  Net 1500.58 ml      Physical Exam   General: chronically ill appearing, frail. Extubated. No respiratory difficulty  HEENT: normal  Neck: supple. no JVD. Carotids 2+ bilat; no bruits. No lymphadenopathy or thyromegaly appreciated. Cor: PMI nondisplaced. RRR. No rubs, gallops or murmurs. Lungs: clear  Abdomen: soft, nontender, nondistended. No hepatosplenomegaly. No bruits or masses. Good bowel sounds. Extremities: no cyanosis, clubbing, rash, edema, ischemic digits on  hands Neuro: awake and follows commands    Telemetry   NSR 90s  Personally reviewed  Labs    CBC Recent Labs    02/06/22 0219 02/07/22 0310 02/08/22 0436  WBC 24.5* 21.3* 20.1*  NEUTROABS 21.6*  --   --   HGB 8.3* 7.8* 7.3*  HCT 24.6* 22.7* 21.4*  MCV 75.0* 74.2* 75.1*  PLT 57* 67* 79*   Basic Metabolic Panel Recent Labs    02/06/22 0219 02/07/22 0310 02/08/22 0436  NA 152* 148* 149*  K 3.9 3.4* 4.5  CL 124* 121* 121*  CO2 22 21* 21*  GLUCOSE 191* 188* 180*  BUN 44* 43* 51*  CREATININE 1.88* 1.79* 1.69*  CALCIUM 7.4* 7.2* 7.8*  MG 1.3*  --   --    Hemoglobin A1C No results for input(s): "HGBA1C" in the last 72 hours. Imaging    DG CHEST PORT 1 VIEW  Result Date: 02/08/2022 CLINICAL DATA:  Intubation, chest tube, pleural effusion EXAM: PORTABLE CHEST 1 VIEW COMPARISON:  Portable exam 0929 hours compared to 02/06/2022 FINDINGS: Tip of endotracheal tube projects 2.4 cm above carina. Feeding tube traverses stomach into proximal small bowel. Nasogastric tube tip projects over stomach. LEFT arm PICC line tip projects over SVC. RIGHT jugular central venous catheter tip projects over SVC. Pigtail LEFT thoracostomy tube unchanged. Normal heart size, mediastinal contours, and pulmonary vascularity. Atherosclerotic calcification aorta. Improved pulmonary infiltrates. Improved atelectasis versus consolidation LEFT lower lobe. No pleural effusion or pneumothorax. IMPRESSION: Improved aeration bilaterally. LEFT thoracostomy tube without pneumothorax. Aortic Atherosclerosis (ICD10-I70.0).  Electronically Signed   By: Lavonia Dana M.D.   On: 02/08/2022 09:39     Medications:     Scheduled Medications:  arformoterol  15 mcg Nebulization BID   budesonide (PULMICORT) nebulizer solution  0.25 mg Nebulization BID   Chlorhexidine Gluconate Cloth  6 each Topical Q0600   docusate  100 mg Per Tube BID   fentaNYL (SUBLIMAZE) injection  50 mcg Intravenous Once   free water  200 mL Per  Tube Q6H   insulin aspart  0-6 Units Subcutaneous Q4H   mouth rinse  15 mL Mouth Rinse Q2H   pantoprazole (PROTONIX) IV  40 mg Intravenous Q12H   polyethylene glycol  17 g Per Tube Daily   sodium chloride flush  10 mL Intrapleural Q8H   sodium chloride flush  10-40 mL Intracatheter Q12H    Infusions:  sodium chloride 10 mL/hr at 02/08/22 1100   sodium chloride 10 mL/hr at 02/08/22 1100   DOBUTamine 1 mcg/kg/min (02/08/22 1100)   feeding supplement (PEPTAMEN 1.5 CAL) 50 mL/hr at 02/08/22 1100   fentaNYL infusion INTRAVENOUS Stopped (02/08/22 1013)   heparin 1,050 Units/hr (02/08/22 1100)   meropenem (MERREM) IV Stopped (02/08/22 1051)   norepinephrine (LEVOPHED) Adult infusion Stopped (02/08/22 1033)    PRN Medications: sodium chloride, acetaminophen, fentaNYL, ipratropium-albuterol, lip balm, mouth rinse, sodium chloride flush    Patient Profile   61 y/o AAF w/ h/o HTN, COPD, anemia, h/o poor med compliance and prior admission to [facility] 9/23 for UTI and pyelonephritis, admitted w/ mixed septic and cardiogenic shock.   Assessment/Plan   1. Shock: Suspect mixed septic shock and cardiogenic shock.  Lactate was > 9 with PCT > 150.  E coli bacteremia with echo showing EF 20-25%.  Remains on dobutamine 1 mcg/kg/min off NE. Co-ox 75% - Meropenem for broad spectrum coverage.   - Will stop DBA. Follow co-ox  2. Acute systolic CHF -> cardiogenic shock: Echo showed EF 20-25% with preservation of apical function but severe hypokinesis of other walls, normal RV, dilated IVC. Unusual wall motion abnormality pattern, prior echo was normal. ?Septic cardiomyopathy with reverse Takotsubo-type pattern.  HS-TnI was minimally elevated with no trend so doubt ACS. Co-ox 75%. Volume status ok on exam. Creatinine down to 1.69.   - Stop DBA. Follow co-ox.  - Volume status ok. Holding diuretics for now. Set up CVP  - Would plan for LHC/RHC if/when creatinine/plts improved and mental status improved to rule  out CAD.  3. AKI: In setting of septic shock.  Creatinine trending down slowly.  4. ID: E coli bacteremia.  PCT > 150 initially. WBCs up to 24.5 today.  Also found to have Enterobacter cloacae in trach aspirate.  Has been having persistent fevers.    - Continue meropenem.  5. Anemia: Hgb 7.3 today - transfuse hgb < 7 6. Thrombocytopenia: Platelets rising, up to 67K today. Suspect fall was due to critical illness/sepsis/inflammation. 7. Acute hypoxemic respiratory failure: PNA on CXR, also suspect pulmonary edema. Intubated.  She has a pre-existing history of COPD.  - If we continue to press forwards aggressively will need tracheostomy.  8. Ischemic digits bilateral hands: Palpable brachial pulses, no palpable ulnar or radial pulses.  Ischemic digits with no flow on duplex US at wrists.  Suspect pressor-induced. High risk for auto-amputation.  - Continue heparin gtt  9. Neuro: Eyes are open but no purposeful movement.  10. Hypernatremia: Improving, Na 152>>148>>149  - continue free water boluses.  11. Goals of care: Critical  illness with MSOF.  I am concerned that even if she survives this episode, quality of life will be poor.  She will likely lose fingers and is probably looking at trach/PEG if she survives.  - Palliative care following   - Now DNR/ DNI, Family wants to continue full scope of treatment   Length of Stay: [place], PA-C  02/08/2022, 12:11 PM  Advanced Heart Failure Team Pager [phone] (M-F; 7a - 5p)  Please contact [facility] for night-coverage after hours (5p -7a ) and weekends on [email or web address]  Patient seen with PA, agree with the above note.   She was extubated this morning, now awake/alert.  Not talking but shakes and nods head.    She remains on dobutamine 1, co-ox 75%.  CVP not set up.  Creatinine lower at 1.69.  Plts continue to trend up at 79.  She remains on heparin gtt.   General: NAD Neck: No JVD, no thyromegaly or thyroid nodule.  Lungs:  Clear to auscultation bilaterally with normal respiratory effort. CV: Nondisplaced PMI.  Heart regular S1/S2, no S3/S4, no murmur.  No peripheral edema.   Abdomen: Soft, nontender, no hepatosplenomegaly, no distention.  Skin: Ischemic digits on hands.  Neurologic: Alert and oriented x 3.  Psych: Normal affect. Extremities: No clubbing or cyanosis.  HEENT: Normal.   Doing well with extubation.  She remains on meropenem for Enterobacter PNA and E coli bacteremia/UTI.   Good co-ox, will stop dobutamine today.  She does not look volume overloaded on exam, set up CVP.  No BP room for GDMT and need to see creatinine trend down further.    Ischemic digits, on heparin gtt.  Likely pressor-induced.  Suspect she will have autoamputation.   CRITICAL CARE Performed by: Loralie Champagne  Total critical care time: 35 minutes  Critical care time was exclusive of separately billable procedures and treating other patients.  Critical care was necessary to treat or prevent imminent or life-threatening deterioration.  Critical care was time spent personally by me on the following activities: development of treatment plan with patient and/or surrogate as well as nursing, discussions with consultants, evaluation of patient's response to treatment, examination of patient, obtaining history from patient or surrogate, ordering and performing treatments and interventions, ordering and review of laboratory studies, ordering and review of radiographic studies, pulse oximetry and re-evaluation of patient's condition.  Loralie Champagne 02/08/2022 12:43 PM

## 2022-02-08 NOTE — Progress Notes (Signed)
ANTICOAGULATION CONSULT NOTE - Follow Up Consult  Pharmacy Consult for Heparin Indication: ischemic digits  No Active Allergies  Patient Measurements: Height: 5\' 4"  (162.6 cm) Weight: 49.6 kg (109 lb 5.6 oz) IBW/kg (Calculated) : 54.7 Heparin Dosing Weight: 48 kg  Vital Signs: Temp: 98 F (36.7 C) (11/16 0744) Temp Source: Axillary (11/16 0744) BP: 107/60 (11/16 0930) Pulse Rate: 81 (11/16 0930)  Labs: Recent Labs    02/06/22 0219 02/06/22 1644 02/07/22 0310 02/07/22 1320 02/08/22 0156 02/08/22 0436 02/08/22 1016  HGB 8.3*  --  7.8*  --   --  7.3*  --   HCT 24.6*  --  22.7*  --   --  21.4*  --   PLT 57*  --  67*  --   --  79*  --   HEPARINUNFRC  --    < > <0.10* <0.10* 0.12*  --  0.19*  CREATININE 1.88*  --  1.79*  --   --  1.69*  --    < > = values in this interval not displayed.    Estimated Creatinine Clearance: 26.7 mL/min (A) (by C-G formula based on SCr of 1.69 mg/dL (H)).   Medical History: Past Medical History:  Diagnosis Date   Bronchitis    COPD (chronic obstructive pulmonary disease) (HCC)    Hypertension     Assessment: 61 yo female presented on 11/6 with AMS found to have e coli bacteremia and mixed cardiogenic/septic shock. Course complicated by ischemic fingers and vascular consulted but unable to use IV heparin due to thrombocytopenia. Noted dark tarry stools and noted somewhat bloody smears on 11/13 per RN. Pharmacy consulted to dose heparin with low goal and no bolus now that plts >50.  Heparin level up to 0.19 after increase to 1050 units/hr but remains sub-therapeutic for goal. Per RN, no bleeding or smears noted. Patient was extubated. Chest tube to be removed this AM.   Goal of Therapy:  Heparin level 0.3-0.5 units/ml Monitor platelets by anticoagulation protocol: Yes   Plan:  Increase heparin to 1150 units/hr -- once chest tube removed and no issues with bleeding. Discussed with RN.  No bolus per HF/CCM Check 8 hr heparin level   Follow up signs of bleeding very closely  Monitor heparin level, CBC and s/s of bleeding dialy   12/13, PharmD, BCPS, BCCCP Clinical Pharmacist Please refer to [facility] for [facility] Pharmacy numbers 02/08/2022, 11:28 AM

## 2022-02-08 NOTE — Progress Notes (Signed)
NAME:  Nicole Cunningham, MRN:  [id number], DOB:  05/11/1960, LOS: 1 ADMISSION DATE:  01/28/2022, CONSULTATION DATE:  11/6 REFERRING MD:  Dr. Sedonia Small, CHIEF COMPLAINT:  septic shock   History of Present Illness:  Patient is a 61 year old female with pertinent PMH of COPD, anemia, HTN presents to [facility] ED on 11/6 with AMS.  Patient recently admitted to [facility] with AMS on 11/2021.  Found to have UTI and pyelonephritis.  On 11/5, patient having increased confusion.  Having some SOB over the past couple of days.  Brought to [facility] ED for further eval.  Upon arrival to [facility] ED, patient confused but able to state name.  Admitted with shock, AKI, lactic acidosis of 9.0  Pertinent ED labs: CO2 14, BUN 51, creat 4.13, AST 52, alk phos 159, AG 23, WBC 1.3, Hgb 9.3, platelets 70, troponin 64 then 80.  CT head without acute abnormality.  CXR no significant findings.  CT abdomen pelvis 17 mm nonobstructive renal calculus without hydronephrosis.  Found to have E. coli bacteremia and EF 20 to 25% on echo, mixed cardiogenic and septic shock  Pertinent  Medical History   Past Medical History:  Diagnosis Date   Bronchitis    COPD (chronic obstructive pulmonary disease) ([place])    Hypertension      Significant Hospital Events: Including procedures, antibiotic start and stop dates in addition to other pertinent events   11/6 septic shock on levo , ETT >> 11/6 Echo EF 20 to 25% global hypokinesis, apex intact 11/6 RIJ HD cath >> dobutamine at 5 mics 11/7>>Dobutamine increased to 7.5 mics , 1 unit PRBC and 1 unit platelet transfusion 11/8 dobutamine increased to 10 mics 11/10 dobut wean, diuretic, pigtail  11/13 restarted levophed overnight , Short run of SVT, weaned Dobutamine to 3 , repleted K, 1 Unit PRBC per cards ( HGB 7.2)  Interim History / Subjective:  Patient became afebrile, heart rate improved Tolerating supportive breathing trial Remains on dobutamine at 1 Coox 74%   Objective   Blood pressure (!)  83/52, pulse 81, temperature 98 F (36.7 C), temperature source Axillary, resp. rate 18, height 5' 4" (1.626 m), weight 49.6 kg, SpO2 98 %.    Vent Mode: PRVC FiO2 (%):  [40 %] 40 % Set Rate:  [15 bmp] 15 bmp Vt Set:  [430 mL] 430 mL PEEP:  [5 cmH20] 5 cmH20 Plateau Pressure:  [11 cmH20] 11 cmH20   Intake/Output Summary (Last 24 hours) at 02/08/2022 0830 Last data filed at 02/08/2022 0800 Gross per 24 hour  Intake 2438.86 ml  Output 1000 ml  Net 1438.86 ml   Filed Weights   02/06/22 0800 02/07/22 0131 02/08/22 0500  Weight: 48.1 kg 48.1 kg 49.6 kg    Examination:   Physical exam: General: Crtitically ill-appearing female, orally intubated HEENT: /AT, eyes anicteric.  ETT and OGT in place Neuro: Awake, following simple commands, intact sensation in all 4 extremities but generalized weak  Chest: Reduced air entry at the bases bilaterally, no wheezes Heart: Regular rate and rhythm, no murmurs or gallops Abdomen: Soft, nontender, nondistended, bowel sounds present Skin: Bilateral finger/gangrene.  Bilateral lower extremities are warm to touch there is some red rash noted on dorsum of lower legs   Coox 74% on dobut 1 K 4.5, serum sodium 149 BUN 51, Creatinine 1.69 HGB 7.3 WBC 20.1 Platelets 79k  Resolved Hospital Problem list     Assessment & Plan:  Mixed cardiogenic and septic shock: with suspicion for sepsis  cardiomyopathy.  E coli UTI E coli bacteremia AKI due to septic ATN Pancytopenia- sepsis related Septic encephalopathy COPD: On Breo Ellipta Acute hypoxic respiratory failure due to multifocal pna caused by ESBL enterobacter  L parapneumonic effusion- s/p pigtail in place Nonobstructing ureteral stone- doubt contributing to current clinical picture Ischemic upper ext with dry gangrene- POA, worse on R Hypokalemia/hypernatremia  Continue meropenem completed 10 days therapy Patient is afebrile, white count started trending down Continue to require low-dose  Levophed, currently at 2 mics Continue dobutamine at 1 per heart failure team until Levophed is off We will repeat x-ray chest as chest tube was placed under waterseal yesterday, output was only 60 cc if x-ray chest showed no more pleural effusion, will discontinue chest tube Respiratory culture is growing Enterobacter Currently patient is tolerating spontaneous breathing trial, she is more responsive, will speak with family and give her a trial of extubation Outpatient follow-up with urology Continue lung protective ventilation Continue Precedex and fentanyl, RASS goal -1 VAP prevention bundle Appreciate vascular surgery follow-up, recommend continuing low-dose heparin, no plans for intervention Goals of care discussions are ongoing, palliative care is following Closely monitor electrolytes and supplement   Best Practice (right click and "Reselect all SmartList Selections" daily)   Diet/type: tubefeeds and NPO, tube feeds DVT prophylaxis: Systemic heparin GI prophylaxis: PPI Lines: Central line and yes and it is still needed Foley:  Yes, and it is still needed Code Status:  DNR Last date of multidisciplinary goals of care discussion [11/14 updated patient's family at bedside, they had meeting with palliative care, patient was made DNR but full scope of treatment currently.    Total critical care time: 36 minutes  Performed by: [place] care time was exclusive of separately billable procedures and treating other patients.   Critical care was necessary to treat or prevent imminent or life-threatening deterioration.   Critical care was time spent personally by me on the following activities: development of treatment plan with patient and/or surrogate as well as nursing, discussions with consultants, evaluation of patient's response to treatment, examination of patient, obtaining history from patient or surrogate, ordering and performing treatments and interventions,  ordering and review of laboratory studies, ordering and review of radiographic studies, pulse oximetry and re-evaluation of patient's condition.   Jacky Kindle, MD [place] See Amion for pager If no response to pager, please call [phone] until 7pm After 7pm, Please call E-link [phone]

## 2022-02-08 NOTE — Progress Notes (Signed)
Daily Progress Note   Patient Name: Nicole Cunningham       Date: 02/08/2022 DOB: 1960-11-16  Age: 61 y.o. MRN#: [id number] Attending Physician: Cheri Fowler, MD Primary Care Physician: [name], MD Admit Date: 01/28/2022 Length of Stay: 10 days  Reason for Consultation/Follow-up: Establishing goals of care  HPI/Patient Profile:  61 y.o. female  with past medical history of COPD, anemia, HTN presents to [facility] ED on 11/6 with AMS. The patient recently admitted to [facility] with AMS on 11/2021.  Found to have UTI and pyelonephritis.  On 11/5, patient having increased confusion.  Having some SOB over the past couple of days.  Brought to [facility] ED for further eval. Upon arrival to [facility] ED, patient confused but able to state name.  Admitted with shock, AKI, lactic acidosis of 9.0  She was found to have E. coli bacteremia and EF 20 to 25% on echo, mixed cardiogenic and septic shock.    PMT was consulted for GOC conversations.  Subjective:   Subjective: Chart Reviewed. Updates received. Patient Assessed. Created space and opportunity for patient  and family to explore thoughts and feelings regarding current medical situation.  Today's Discussion: I spoke with the critical care doctor prior to entering and states that she is overall improved somewhat neurologically but is also improved from a respiratory standpoint where they may attempt an extubation.  However, they explained that it is advisable to reintubate if she does not do well.  I saw the patient at the bedside.  Both her son and her husband were present.  She is more awake and aware today.  She opened her eyes when I called her by her nickname.  She shook her head yes/no to some questions.  At a couple instances she did smile and attempt to laugh.   I spoke with both her husband and her son about the possibility for extubation.  I clarified that they are not "giving up" but rather given that her respiratory status is improved we will attempt extubation  to allow her to breathe on her own with continued medical support.  However, I expressed that if she did decompensate from a respiratory standpoint bringing up the possibility of reintubation that she would likely require trach, PEG, long-term facility care likely without digits or hands.  Her husband is very clear that he would not want to do this to her.  I then explained that if she does decompensate they would not plan to reintubate and instead shift to comfort care.  I explained what comfort care entails including focusing on comfort, peace, dignity as he approaches end-of-life.  Regardless I recommended that after extubation they spend as much time as possible being with the patient, talking to her, and enjoying the time that they are blessed with.  They agreed.    At the end of our conversation the husband has agreed to one-way extubation with time for outcomes post extubation.  If she improves they will continue medical support.  If she decompensates and declines and able to shift to comfort care.  Critical care physician was updated and they plan to attempt extubation shortly.  I provided emotional and general support through therapeutic listening, empathy, sharing of stories, and other techniques. I answered all questions and addressed all concerns to the best of my ability.  Review of Systems  Unable to perform ROS: Intubated    Objective:   Vital Signs:  BP (!) 97/49   Pulse 85   Temp 98.1 F (  36.7 C) (Oral)   Resp (!) 24   Ht 5\' 4"  (1.626 m)   Wt 49.6 kg   SpO2 100%   BMI 18.77 kg/m   Physical Exam: Physical Exam Vitals and nursing note reviewed.  Constitutional:      General: She is not in acute distress.    Appearance: She is ill-appearing.  HENT:     Head: Normocephalic and atraumatic.  Cardiovascular:     Rate and Rhythm: Tachycardia present.  Pulmonary:     Effort: No respiratory distress.  Abdominal:     General: Abdomen is flat.     Palpations: Abdomen is  soft.  Musculoskeletal:     Comments: Wincing to touch of bilateral hands  Skin:    General: Skin is warm and dry.     Comments: Cold to the tough bilateral mid-forearm down through bilateral hands  Neurological:     Comments: More awake today, following commands     Palliative Assessment/Data: 10%    Existing Vynca/ACP Documentation: None  Assessment & Plan:   Impression: Present on Admission:  Septic shock (HCC)  61 year old female with chronic comorbidities and acute presentations as described above.  She has poor functional status as an outpatient, COPD.  She is now mixed septic and cardiogenic shock with AKI, pancytopenia, septic encephalopathy, multifocal pneumonia requiring pressors and eventually will need a left and right heart cath.  She also has limb ischemia to bilateral hands and bilateral feet and was unable to start heparin due to pancytopenia until two days ago. Remains at risk for autoamputation.  She has been intubated for approximately 10 days.  improved respirator status and after discussion with [facility] and family, have elected for one-way extubation today and no plans to reintubate if she decompensates. Overall prognosis poor.    SUMMARY OF RECOMMENDATIONS   Remain DNR Continue to treat the treatable One-way extubation today, no plans to reintubate if she declines Further discussions as needed PMT will continue to follow and support patient and family  Symptom Management:  Per primary team PMT is available to assist as needed  Code Status: DNR  Prognosis: Unable to determine  Discharge Planning: To Be Determined  Discussed with: Medical team, nursing team  Thank you for allowing [facility] to participate in the care of QUINNE PIRES PMT will continue to support holistically.  Billing based on MDM: High  Problems Addressed: One acute or chronic illness or injury that poses a threat to life or bodily function  Amount and/or Complexity of Data: Category  3:Discussion of management or test interpretation with external physician/other qualified health care professional/appropriate source (not separately reported)  Risks: Decision not to resuscitate or to de-escalate care because of poor prognosis  Arlyn Leak, NP Palliative Medicine Team  Team Phone # [phone] (Nights/Weekends)  11/22/2020, 8:17 AM

## 2022-02-08 NOTE — Progress Notes (Signed)
ANTICOAGULATION CONSULT NOTE - Follow Up Consult  Pharmacy Consult for Heparin Indication: ischemic digits  No Active Allergies  Patient Measurements: Height: 5\' 4"  (162.6 cm) Weight: 48.1 kg (106 lb 0.7 oz) IBW/kg (Calculated) : 54.7 Heparin Dosing Weight: 48 kg  Vital Signs: Temp: 98.6 F (37 C) (11/16 0009) Temp Source: Oral (11/16 0009) BP: 85/47 (11/15 2345) Pulse Rate: 89 (11/15 2345)  Labs: Recent Labs    02/05/22 1950 02/06/22 0219 02/06/22 1644 02/07/22 0310 02/07/22 1320 02/08/22 0156  HGB 8.2* 8.3*  --  7.8*  --   --   HCT 25.4* 24.6*  --  22.7*  --   --   PLT  --  57*  --  67*  --   --   HEPARINUNFRC  --   --    < > <0.10* <0.10* 0.12*  CREATININE  --  1.88*  --  1.79*  --   --    < > = values in this interval not displayed.     Estimated Creatinine Clearance: 24.4 mL/min (A) (by C-G formula based on SCr of 1.79 mg/dL (H)).   Medical History: Past Medical History:  Diagnosis Date   Bronchitis    COPD (chronic obstructive pulmonary disease) (HCC)    Hypertension     Assessment: 61 yo female presented on 11/6 with AMS found to have e coli bacteremia and mixed cardiogenic/septic shock. Course complicated by ischemic fingers and vascular consulted but unable to use IV heparin due to thrombocytopenia. Noted dark tarry stools and noted somewhat bloody smears on 11/13 per RN. Pharmacy consulted to dose heparin with low goal and no bolus now that plts >50.  11/16 AM update:  Heparin low but trending up  Goal of Therapy:  Heparin level 0.3-0.5 units/ml Monitor platelets by anticoagulation protocol: Yes   Plan:  Increase heparin to 1050 units/hr No bolus per HF/CCM Check 8 hr heparin level  Follow up signs of bleeding very closely  Monitor heparin level, CBC and s/s of bleeding dialy   12/16, PharmD, BCPS Clinical Pharmacist Phone: [phone]

## 2022-02-08 NOTE — Progress Notes (Signed)
ANTICOAGULATION CONSULT NOTE - Follow Up Consult  Pharmacy Consult for Heparin Indication: ischemic digits  No Active Allergies  Patient Measurements: Height: 5\' 4"  (162.6 cm) Weight: 49.6 kg (109 lb 5.6 oz) IBW/kg (Calculated) : 54.7 Heparin Dosing Weight: 48 kg  Vital Signs: Temp: 98.6 F (37 C) (11/16 1947) Temp Source: Oral (11/16 1947) BP: 144/68 (11/16 1700) Pulse Rate: 112 (11/16 2004)  Labs: Recent Labs    02/06/22 0219 02/06/22 1644 02/07/22 0310 02/07/22 1320 02/08/22 0156 02/08/22 0436 02/08/22 1016 02/08/22 2116  HGB 8.3*  --  7.8*  --   --  7.3*  --   --   HCT 24.6*  --  22.7*  --   --  21.4*  --   --   PLT 57*  --  67*  --   --  79*  --   --   HEPARINUNFRC  --    < > <0.10*   < > 0.12*  --  0.19* 0.21*  CREATININE 1.88*  --  1.79*  --   --  1.69*  --   --    < > = values in this interval not displayed.     Estimated Creatinine Clearance: 26.7 mL/min (A) (by C-G formula based on SCr of 1.69 mg/dL (H)).   Medical History: Past Medical History:  Diagnosis Date   Bronchitis    COPD (chronic obstructive pulmonary disease) (HCC)    Hypertension     Assessment: 61 yo female presented on 11/6 with AMS found to have e coli bacteremia and mixed cardiogenic/septic shock. Course complicated by ischemic fingers and vascular consulted but unable to use IV heparin due to thrombocytopenia. Noted dark tarry stools and noted somewhat bloody smears on 11/13 per RN. Pharmacy consulted to dose heparin with low goal and no bolus now that plts >50.  Heparin level up to 0.21 after increase to 1150 units/hr, remains sub-therapeutic. No bleeding or IV issues noted.   Goal of Therapy:  Heparin level 0.3-0.5 units/ml Monitor platelets by anticoagulation protocol: Yes   Plan:  Increase heparin to 1250 units/hr No bolus per HF/CCM Check 8 hr heparin level  Follow up signs of bleeding very closely  Monitor heparin level, CBC and s/s of bleeding dialy   12/13  PharmD., BCPS Clinical Pharmacist 02/08/2022 10:16 PM

## 2022-02-08 NOTE — TOC Initial Note (Signed)
Transition of Care [facility]) - Initial/Assessment Note    Patient Details  Name: Nicole Cunningham MRN: [id number] Date of Birth: 22-Nov-1960  Transition [facility]) CM/SW Contact:    Elliot Cousin, RN Phone Number: [phone] 02/08/2022, 4:01 PM  Clinical Narrative:                  HF TOC CM at bedside to speak to pt. Pt unable to speak at time but she was able to nod her head. Requested CM speak to husband. Contacted husband. Pt currently has a RW with seat at home. Waiting PT/OT recommendations. Pt will need assistance with meds.Message sent to financial counselor for CAFA and disability.  Pt has been screened for Medicaid.  Expected Discharge Plan: Home w Home Health Services Barriers to Discharge: Continued Medical Work up   Patient Goals and CMS Choice        Expected Discharge Plan and Services Expected Discharge Plan: Home w Home Health Services   Discharge Planning Services: CM Consult   Living arrangements for the past 2 months: Hotel/Motel                                      Prior Living Arrangements/Services Living arrangements for the past 2 months: Hotel/Motel Lives with:: Spouse Patient language and need for interpreter reviewed:: Yes        Need for Family Participation in Patient Care: Yes (Comment) Care giver support system in place?: Yes (comment) Current home services: DME ([employer] with seat) Criminal Activity/Legal Involvement Pertinent to Current Situation/Hospitalization: No - Comment as needed  Activities of Daily Living      Permission Sought/Granted Permission sought to share information with : Case Manager, Family Supports, PCP Permission granted to share information with : Yes, Verbal Permission Granted  Share Information with NAME: Aanchal Cope     Permission granted to share info w Relationship: husband  Permission granted to share info w Contact Information: [phone]  Emotional Assessment Appearance::  Appears stated age Attitude/Demeanor/Rapport: Other (comment) (unable to speak at this time, nodded head) Affect (typically observed): Accepting Orientation: : Oriented to Self   Psych Involvement: No (comment)  Admission diagnosis:  Septic shock (HCC) [A41.9, R65.21] Sepsis, due to unspecified organism, unspecified whether acute organ dysfunction present [facility]) [A41.9] Patient Active Problem List   Diagnosis Date Noted   Septic shock (HCC) 01/29/2022   Acute encephalopathy 01/29/2022   Protein-calorie malnutrition, severe 12/09/2021   Anxiety and depression 12/07/2021   Thrombocytopenia (HCC) 12/07/2021   Unintentional weight loss 12/06/2021   Delirium 12/06/2021   Symptomatic anemia 04/08/2021   Syncope 04/08/2021   MDD (major depressive disorder), recurrent, 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   Essential hypertension 04/25/2020   History of COVID-19 04/25/2020   History of sepsis 04/25/2020   Anemia 04/25/2020   Hypomagnesemia 04/25/2020   COVID-19 virus infection 04/20/2020   AKI (acute kidney injury) (HCC) 04/18/2020   Severe sepsis (HCC) 04/18/2020   Hypertensive urgency 12/15/2019   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), [place] - [address] [place] [address] Phone: [phone] Fax: [phone]  [facility] - [place], [place] - [address] [employer] [place] [address] Phone: [phone] Fax: [phone]  [facility] - [facility] [address] [place] [address] Phone: [phone] Fax: [phone]  [facility] - [facility] [address] [place] [address] Phone: [phone] Fax:  [phone]  [facility] [address] [place] [address] Phone: [phone] Fax: [phone]     Social Determinants of Health (SDOH) Interventions    Readmission Risk Interventions     No data to display

## 2022-02-09 DIAGNOSIS — R7881 Bacteremia: Secondary | ICD-10-CM

## 2022-02-09 DIAGNOSIS — I509 Heart failure, unspecified: Secondary | ICD-10-CM

## 2022-02-09 DIAGNOSIS — L8990 Pressure ulcer of unspecified site, unspecified stage: Secondary | ICD-10-CM | POA: Insufficient documentation

## 2022-02-09 LAB — CBC
HCT: 22.8 % — ABNORMAL LOW (ref 36.0–46.0)
Hemoglobin: 7.6 g/dL — ABNORMAL LOW (ref 12.0–15.0)
MCH: 25 pg — ABNORMAL LOW (ref 26.0–34.0)
MCHC: 33.3 g/dL (ref 30.0–36.0)
MCV: 75 fL — ABNORMAL LOW (ref 80.0–100.0)
Platelets: 120 10*3/uL — ABNORMAL LOW (ref 150–400)
RBC: 3.04 MIL/uL — ABNORMAL LOW (ref 3.87–5.11)
RDW: 30.7 % — ABNORMAL HIGH (ref 11.5–15.5)
WBC: 19.1 10*3/uL — ABNORMAL HIGH (ref 4.0–10.5)
nRBC: 0 % (ref 0.0–0.2)

## 2022-02-09 LAB — COOXEMETRY PANEL
Carboxyhemoglobin: 2.8 % — ABNORMAL HIGH (ref 0.5–1.5)
Methemoglobin: <0.7 % (ref 0.0–1.5)
O2 Saturation: 78.3 %
Total hemoglobin: 11.1 g/dL — ABNORMAL LOW (ref 12.0–16.0)

## 2022-02-09 LAB — BASIC METABOLIC PANEL
Anion gap: 7 (ref 5–15)
BUN: 48 mg/dL — ABNORMAL HIGH (ref 8–23)
CO2: 19 mmol/L — ABNORMAL LOW (ref 22–32)
Calcium: 8.1 mg/dL — ABNORMAL LOW (ref 8.9–10.3)
Chloride: 123 mmol/L — ABNORMAL HIGH (ref 98–111)
Creatinine, Ser: 1.5 mg/dL — ABNORMAL HIGH (ref 0.44–1.00)
GFR, Estimated: 39 mL/min — ABNORMAL LOW (ref >60)
Glucose, Bld: 136 mg/dL — ABNORMAL HIGH (ref 70–99)
Potassium: 4.1 mmol/L (ref 3.5–5.1)
Sodium: 149 mmol/L — ABNORMAL HIGH (ref 135–145)

## 2022-02-09 LAB — GLUCOSE, CAPILLARY
Glucose-Capillary: 135 mg/dL — ABNORMAL HIGH (ref 70–99)
Glucose-Capillary: 141 mg/dL — ABNORMAL HIGH (ref 70–99)
Glucose-Capillary: 159 mg/dL — ABNORMAL HIGH (ref 70–99)
Glucose-Capillary: 137 mg/dL — ABNORMAL HIGH (ref 70–99)
Glucose-Capillary: 120 mg/dL — ABNORMAL HIGH (ref 70–99)
Glucose-Capillary: 140 mg/dL — ABNORMAL HIGH (ref 70–99)

## 2022-02-09 LAB — HEPARIN LEVEL (UNFRACTIONATED)
Heparin Unfractionated: 0.37 IU/mL (ref 0.30–0.70)
Heparin Unfractionated: 0.39 IU/mL (ref 0.30–0.70)

## 2022-02-09 MED ORDER — QUETIAPINE FUMARATE 50 MG PO TABS
50.0000 mg | ORAL_TABLET | Freq: Two times a day (BID) | ORAL | Status: DC
  Administered 2022-02-09 – 2022-02-19 (×21): 50 mg
  Filled 2022-02-09 (×21): qty 1

## 2022-02-09 MED ORDER — OXYCODONE HCL 5 MG/5ML PO SOLN
2.5000 mg | ORAL | Status: DC | PRN
  Administered 2022-02-09 – 2022-02-19 (×10): 2.5 mg
  Filled 2022-02-09 (×10): qty 5

## 2022-02-09 MED ORDER — FLUOXETINE HCL 20 MG PO CAPS
20.0000 mg | ORAL_CAPSULE | Freq: Every day | ORAL | Status: DC
  Administered 2022-02-09 – 2022-02-19 (×11): 20 mg
  Filled 2022-02-09 (×11): qty 1

## 2022-02-09 MED ORDER — ISOSORB DINITRATE-HYDRALAZINE 20-37.5 MG PO TABS
0.5000 | ORAL_TABLET | Freq: Three times a day (TID) | ORAL | Status: DC
  Administered 2022-02-09 – 2022-02-12 (×12): 0.5
  Filled 2022-02-09 (×12): qty 1

## 2022-02-09 MED ORDER — PHENOL 1.4 % MT LIQD: 1.0000 | OROMUCOSAL | Status: DC | PRN

## 2022-02-09 MED ORDER — VITAL 1.5 CAL PO LIQD
1000.0000 mL | ORAL | Status: DC
  Administered 2022-02-09 – 2022-02-14 (×6): 1000 mL
  Filled 2022-02-09 (×10): qty 1000

## 2022-02-09 MED ORDER — FREE WATER
200.0000 mL | Status: DC
  Administered 2022-02-09 – 2022-02-12 (×17): 200 mL

## 2022-02-09 NOTE — Progress Notes (Signed)
Nutrition Follow-up  DOCUMENTATION CODES:   Underweight, Severe malnutrition in context of chronic illness  INTERVENTION:   Continue tube feeds via post-pyloric Cortrak: - Change to Vital 1.5 @ 50 ml/hr (1200 ml/day) - Free water per CCM, currently 200 ml q 4 hours  Tube feeding regimen provides 1800 kcal, 81 grams of protein, and 917 ml of H2O.  Total free water with flushes: 2117 ml  NUTRITION DIAGNOSIS:   Severe Malnutrition related to chronic illness (COPD) as evidenced by severe muscle depletion, severe fat depletion.  Ongoing, being addressed via TF  GOAL:   Patient will meet greater than or equal to 90% of their needs  Met via TF  MONITOR:   Diet advancement, Labs, Weight trends, TF tolerance, Skin, I & O's  REASON FOR ASSESSMENT:   Consult Enteral/tube feeding initiation and management (trickle tube feeds)  ASSESSMENT:   61 year old female who presented to the ED on 11/05 with AMS. PMH of COPD, anemia, HTN. Pt admitted with septic shock, AKI.  11/06 - intubation 11/07 - trickle TF started 11/08 - TF advanced to goal, Cortrak placed (tip gastric) 11/09 - TF held due to vomiting, OG tube placed to LIWS 11/10 - Cortrak advanced post-pyloric (tip "probably transpyloric in the proximal jejunum"), insertion of chest tube 11/16 - extubated  Discussed pt with RN and during ICU rounds. Pt tolerating tube feeds via Cortrak. Peptamen 1.5 is now out of stock. Will change order to Vital 1.5. Per SLP note, pt to remain NPO with medication via alternative means. Pt not ready for POs or instrumental swallow study at this time. Will continue with tube feeds via Cortrak at this time.  Pt is off levophed and dobutamine. Chest tube has been removed.  Per HF team, if creatinine continues to trend down, would plan RHC/LHC next week to evaluate cardiomyopathy. Pt with ischemic digits, likely pressor-inducted. 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  Pt with mild pitting  generalized edema.  Current TF: Peptamen 1.5 @ 50 ml/hr, free water flushes of 200 ml q 4 hours  Admit weight: 40.8 kg Current weight: 49.9 kg  Medications reviewed and include: colace, SSI q 4 hours, IV protonix, miralax, heparin, IV abx  Labs reviewed: sodium 149, BUN 48, creatinine 1.50, WBC 19.1, hemoglobin 7.6, platelets 120 CBG's: 115-169 x 24 hours  UOP: 600 ml x 24 hours Chest tube: 40 ml x 24 hours I/O's: +17.5 L since admit  Diet Order:   Diet Order             Diet NPO time specified  Diet effective now                   EDUCATION NEEDS:   Not appropriate for education at this time  Skin:  Skin Assessment: Reviewed RN Assessment (skin tear to perineum)  Last BM:  02/08/22 small type 7  Height:   Ht Readings from Last 1 Encounters:  02/06/22 _0  (1.626 m)    Weight:   Wt Readings from Last 1 Encounters:  02/09/22 49.9 kg    Ideal Body Weight:  54.5 kg  BMI:  Body mass index is 18.88 kg/m.  Estimated Nutritional Needs:   Kcal:  1700-1900  Protein:  75-90 grams  Fluid:  1.7-1.9 L    Gustavus Bryant, MS, RD, LDN Inpatient Clinical Dietitian Please see AMiON for contact information.

## 2022-02-09 NOTE — Progress Notes (Addendum)
Patient ID: Nicole Cunningham, female   DOB: 1960/12/09, 61 y.o.   MRN: [id number]   Advanced Heart Failure Rounding Note  PCP-Cardiologist: None   Subjective:    11/16 Extubated. DBA stopped.   Co-ox 75%. CVP 1-2.   Bronchoscopy performed 11/14 for worsening PNA>>Copious amount of tenacious secretions noted in the RM/RL/LLL, suctioned and BAL was performed. Cultures + for Enterobacter Cloacae.   Remains on meropenum. WBC 24>>21>>20K>>19.1. AF.   On heparin gtt. Plt 57>>67>>79K>>120. Hgb 7.6   Na 149  SCr 1.7>1.5    Awake following commands.    Objective:   Weight Range: 49.9 kg Body mass index is 18.88 kg/m.   Vital Signs:   Temp:  [98.1 F (36.7 C)-98.6 F (37 C)] 98.1 F (36.7 C) (11/17 0746) Pulse Rate:  [75-119] 106 (11/17 0700) Resp:  [16-37] 30 (11/17 0700) BP: (80-161)/(48-86) 131/63 (11/17 0700) SpO2:  [95 %-100 %] 100 % (11/17 0700) Weight:  [49.9 kg] 49.9 kg (11/17 0500) Last BM Date : 02/08/22  Weight change: Filed Weights   02/07/22 0131 02/08/22 0500 02/09/22 0500  Weight: 48.1 kg 49.6 kg 49.9 kg    Intake/Output:   Intake/Output Summary (Last 24 hours) at 02/09/2022 0751 Last data filed at 02/09/2022 0600 Gross per 24 hour  Intake 2156.35 ml  Output 1240 ml  Net 916.35 ml      Physical Exam  CVP 1-2  General:  No resp difficulty HEENT: normal + Cortrak  Neck: supple. no JVD. Carotids 2+ bilat; no bruits. No lymphadenopathy or thryomegaly appreciated. Cor: PMI nondisplaced. Regular rate & rhythm. No rubs, gallops or murmurs. Lungs: clear Abdomen: soft, nontender, nondistended. No hepatosplenomegaly. No bruits or masses. Good bowel sounds. Extremities: no cyanosis, clubbing, rash, edema. Ischemic hands and feet. LUE PICC  Neuro: alert & oriented to self, cranial nerves grossly intact. moves all 4 extremities w/o difficulty.    Telemetry   SR -ST 90-100s   Labs    CBC Recent Labs    02/08/22 0436 02/09/22 0530  WBC 20.1* 19.1*   HGB 7.3* 7.6*  HCT 21.4* 22.8*  MCV 75.1* 75.0*  PLT 79* 127*   Basic Metabolic Panel Recent Labs    02/08/22 0436 02/09/22 0530  NA 149* 149*  K 4.5 4.1  CL 121* 123*  CO2 21* 19*  GLUCOSE 180* 136*  BUN 51* 48*  CREATININE 1.69* 1.50*  CALCIUM 7.8* 8.1*   Hemoglobin A1C No results for input(s): "HGBA1C" in the last 72 hours. Imaging    DG CHEST PORT 1 VIEW  Result Date: 02/08/2022 CLINICAL DATA:  Intubation, chest tube, pleural effusion EXAM: PORTABLE CHEST 1 VIEW COMPARISON:  Portable exam 0929 hours compared to 02/06/2022 FINDINGS: Tip of endotracheal tube projects 2.4 cm above carina. Feeding tube traverses stomach into proximal small bowel. Nasogastric tube tip projects over stomach. LEFT arm PICC line tip projects over SVC. RIGHT jugular central venous catheter tip projects over SVC. Pigtail LEFT thoracostomy tube unchanged. Normal heart size, mediastinal contours, and pulmonary vascularity. Atherosclerotic calcification aorta. Improved pulmonary infiltrates. Improved atelectasis versus consolidation LEFT lower lobe. No pleural effusion or pneumothorax. IMPRESSION: Improved aeration bilaterally. LEFT thoracostomy tube without pneumothorax. Aortic Atherosclerosis (ICD10-I70.0). Electronically Signed   By: Lavonia Dana M.D.   On: 02/08/2022 09:39     Medications:     Scheduled Medications:  arformoterol  15 mcg Nebulization BID   budesonide (PULMICORT) nebulizer solution  0.25 mg Nebulization BID   Chlorhexidine Gluconate Cloth  6 each Topical  Q0600   docusate  100 mg Per Tube BID   fentaNYL (SUBLIMAZE) injection  50 mcg Intravenous Once   free water  200 mL Per Tube Q6H   insulin aspart  0-6 Units Subcutaneous Q4H   mouth rinse  15 mL Mouth Rinse 4 times per day   pantoprazole (PROTONIX) IV  40 mg Intravenous Q12H   polyethylene glycol  17 g Per Tube Daily   sodium chloride flush  10-40 mL Intracatheter Q12H    Infusions:  sodium chloride 10 mL/hr at 02/09/22  0600   sodium chloride 10 mL/hr at 02/09/22 0600   feeding supplement (PEPTAMEN 1.5 CAL) 50 mL/hr at 02/09/22 0600   fentaNYL infusion INTRAVENOUS Stopped (02/08/22 1013)   heparin 1,250 Units/hr (02/09/22 0600)   meropenem (MERREM) IV Stopped (02/08/22 2158)   norepinephrine (LEVOPHED) Adult infusion Stopped (02/08/22 1033)    PRN Medications: sodium chloride, acetaminophen, fentaNYL, ipratropium-albuterol, lip balm, mouth rinse, sodium chloride flush    Patient Profile   61 y/o AAF w/ h/o HTN, COPD, anemia, h/o poor med compliance and prior admission to [facility] 9/23 for UTI and pyelonephritis, admitted w/ mixed septic and cardiogenic shock.   Assessment/Plan   1. Shock: Now resolved.  Suspect mixed septic shock and cardiogenic shock.  Lactate was > 9 with PCT > 150.  E coli bacteremia with echo showing EF 20-25%.  Meropenem for broad spectrum coverage.   - CO-OX 78% 2. Acute systolic CHF -> cardiogenic shock: Echo showed EF 20-25% with preservation of apical function but severe hypokinesis of other walls, normal RV, dilated IVC. Unusual wall motion abnormality pattern, prior echo was normal. ?Septic cardiomyopathy with reverse Takotsubo-type pattern.  HS-TnI was minimally elevated with no trend so doubt ACS.  She is now off dobutamine.  - Creatinine down to 1.5.   - CO-OX  stable. CVP 1-2. No diuretics.  -  Would plan for LHC/RHC if/when creatinine/plts improved and mental status improved to rule out CAD.  3. AKI: In setting of septic shock.  Creatinine trending down slowly.  4. ID: E coli bacteremia.  PCT > 150 initially. WBCs down to 19  Also found to have Enterobacter cloacae in trach aspirate.  Has been having persistent fevers.    - Continue meropenem.  5. Anemia: Hgb 7.6 today - transfuse hgb < 7 6. Thrombocytopenia: Platelets rising, up to 120 K today. Suspect fall was due to critical illness/sepsis/inflammation. 7. Acute hypoxemic respiratory failure: PNA on CXR, also suspect  pulmonary edema. Yesterday extubated. Sats on 4 liters.  She has a pre-existing history of COPD.   8. Ischemic digits bilateral hands: Palpable brachial pulses, no palpable ulnar or radial pulses.  Ischemic digits with no flow on duplex US at wrists.  Suspect pressor-induced. High risk for auto-amputation.  - Continue heparin gtt  9. Neuro: Eyes are open but no purposeful movement.  10. Hypernatremia: Improving, Na 149 - continue free water boluses.   Length of Stay: [place], NP  02/09/2022, 7:51 AM  Advanced Heart Failure Team Pager [phone] (M-F; 7a - 5p)  Please contact [facility] for night-coverage after hours (5p -7a ) and weekends on [email or web address]  Patient seen with NP, agree with the above note.   She is doing considerably better.  Extubated, off pressors and dobutamine.  Co-ox 78% with creatinine down to 1.5. CVP 1-2.    She follows commands and is speaking a little.   She remains on heparin gtt with ischemic digits.  Plts  continue to rise.   General: Thin Neck: No JVD, no thyromegaly or thyroid nodule. Cortrak.  Lungs: Clear to auscultation bilaterally with normal respiratory effort. CV: Nondisplaced PMI.  Heart regular S1/S2, no S3/S4, no murmur.  No peripheral edema.   Abdomen: Soft, nontender, no hepatosplenomegaly, no distention.  Skin: Intact without lesions or rashes.  Neurologic: Alert and oriented x 3.  Psych: Normal affect. Extremities: Ischemic fingers.  HEENT: Normal.   Shock now resolved, off pressors/inotropes.  SBP 130s. Creatinine trending down.  Good co-ox and low CVP.  - Will add Bidil 1/2 tab tid.  - Does not need diuresis.  - If creatinine continues to trend down, would plan RHC/LHC next week (groin access with ischemic hands) to evaluate cardiomyopathy (possible septic cardiomyopathy but need to rule out severe CAD with smoking history).   Infectious issues as discussed above, she remains on meropenem for E coli and Enterobacter and is  afebrile with WBCs coming down.   Ischemic digits likely pressor-induced.  Suspect she will have auto-amputation of fingers.  Continue heparin gtt for now per vascular.  Platelets are rising.   Loralie Champagne 02/09/2022 8:22 AM

## 2022-02-09 NOTE — Progress Notes (Signed)
ANTICOAGULATION CONSULT NOTE - Follow Up Consult  Pharmacy Consult for Heparin Indication: ischemic digits  No Active Allergies  Patient Measurements: Height: 5\' 4"  (162.6 cm) Weight: 49.9 kg (110 lb 0.2 oz) IBW/kg (Calculated) : 54.7 Heparin Dosing Weight: 48 kg  Vital Signs: Temp: 98.3 F (36.8 C) (11/17 0332) Temp Source: Oral (11/17 0332) BP: 132/70 (11/17 0200) Pulse Rate: 111 (11/17 0200)  Labs: Recent Labs    02/07/22 0310 02/07/22 1320 02/08/22 0436 02/08/22 1016 02/08/22 2116 02/09/22 0530  HGB 7.8*  --  7.3*  --   --   --   HCT 22.7*  --  21.4*  --   --   --   PLT 67*  --  79*  --   --   --   HEPARINUNFRC <0.10*   < >  --  0.19* 0.21* 0.37  CREATININE 1.79*  --  1.69*  --   --   --    < > = values in this interval not displayed.     Estimated Creatinine Clearance: 26.8 mL/min (A) (by C-G formula based on SCr of 1.69 mg/dL (H)).   Medical History: Past Medical History:  Diagnosis Date   Bronchitis    COPD (chronic obstructive pulmonary disease) (HCC)    Hypertension     Assessment: 61 yo female presented on 11/6 with AMS found to have e coli bacteremia and mixed cardiogenic/septic shock. Course complicated by ischemic fingers and vascular consulted but unable to use IV heparin due to thrombocytopenia. Noted dark tarry stools and noted somewhat bloody smears on 11/13 per RN. Pharmacy consulted to dose heparin with low goal and no bolus now that plts >50.  11/17 AM update:  Heparin level therapeutic   Goal of Therapy:  Heparin level 0.3-0.5 units/ml Monitor platelets by anticoagulation protocol: Yes   Plan:  Cont heparin 1250 units/hr 1400 heparin level  12/17, PharmD, BCPS Clinical Pharmacist Phone: [phone]

## 2022-02-09 NOTE — Evaluation (Signed)
Clinical/Bedside Swallow Evaluation Patient Details  Name: Nicole Cunningham MRN: [id number] Date of Birth: [birth year]  Today's Date: 02/09/2022 Time: SLP Start Time (ACUTE ONLY): 1017 SLP Stop Time (ACUTE ONLY): 1032 SLP Time Calculation (min) (ACUTE ONLY): 15 min  Past Medical History:  Past Medical History:  Diagnosis Date   Bronchitis    COPD (chronic obstructive pulmonary disease) ([name])    Hypertension    Past Surgical History: History reviewed. No pertinent surgical history. HPI:  Patient is a 61 year old female who presented to [facility] ED on 11/6 with AMS, UTI and pyelonephritis, cardiogenic and septic shock, AKI, acute hypoxic respiratory failure due to multifocal pna caused by ESBL enterobacter, L parapneumonic effusion, bilateral finger dry gangrene.  ETT 11/6-11/16. PMH of COPD, anemia, HTN. Failed [facility] swallow screen after extubation.    Assessment / Plan / Recommendation  Clinical Impression  Pt presents with a probable post-extubation dysphagia after 10 day oral intubation. Voice is weak/breathy; jaw opening is limited despite cues and attempts to improve extension.  Pt accepted ice chips with good effort to masticate, mild throat clearing after the swallow. Sips of water led to consistent cough response.  Cough is weak. RR reached into the 40 several times during assessment, creating concerns for swallow/respiratory synchrony.  Pt not ready for POs or instrumental swallow study.  Recommend allowing occasional ice chips after oral care.  D/W RN and pt's husband and son at bedside. SLP will follow for readiness for FEES/MBS. SLP Visit Diagnosis: Dysphagia, oropharyngeal phase (R13.12)    Aspiration Risk  Moderate aspiration risk    Diet Recommendation   NPO - cortrak feeds; allow ice chips after oral care  Medication Administration: Via alternative means    Other  Recommendations Oral Care Recommendations: Oral care QID    Recommendations for follow up therapy are one  component of a multi-disciplinary discharge planning process, led by the attending physician.  Recommendations may be updated based on patient status, additional functional criteria and insurance authorization.  Follow up Recommendations Other (comment) (tba)              Frequency and Duration min 2x/week  2 weeks       Prognosis Prognosis for Safe Diet Advancement: Good      Swallow Study   General Date of Onset: 01/29/22 HPI: Patient is a 61 year old female who presented to [facility] ED on 11/6 with AMS, UTI and pyelonephritis, cardiogenic and septic shock, AKI, acute hypoxic respiratory failure due to multifocal pna caused by ESBL enterobacter, L parapneumonic effusion, bilateral finger dry gangrene.  ETT 11/6-11/16. PMH of COPD, anemia, HTN. Failed [facility] swallow screen after extubation. Type of Study: Bedside Swallow Evaluation Diet Prior to this Study: NPO;NG Tube Temperature Spikes Noted: No Respiratory Status: Nasal cannula History of Recent Intubation: Yes Length of Intubations (days): 10 days Date extubated: 02/08/22 Behavior/Cognition: Alert;Cooperative Oral Care Completed by SLP: Recent completion by staff Oral Cavity - Dentition: Adequate natural dentition Self-Feeding Abilities: Total assist Patient Positioning: Upright in bed Baseline Vocal Quality: Breathy Volitional Cough: Weak Volitional Swallow: Able to elicit    Oral/Motor/Sensory Function Overall Oral Motor/Sensory Function: Other (comment) (limited jaw opening)   Ice Chips Ice chips: Impaired Presentation: Spoon Pharyngeal Phase Impairments: Suspected delayed Swallow;Throat Clearing - Delayed   Thin Liquid Thin Liquid: Impaired Presentation: Spoon;Straw Pharyngeal  Phase Impairments: Multiple swallows;Cough - Immediate    Nectar Thick Nectar Thick Liquid: Not tested   Honey Thick Honey Thick Liquid: Not tested   Puree Puree: Not  tested   Solid     Solid: Not tested      Nicole Cunningham  Nicole Cunningham 02/09/2022,10:42 AM  Nicole Cunningham L. Samson Frederic, MA CCC/SLP Clinical Specialist - Acute Care SLP Acute Rehabilitation Services Office number [phone]

## 2022-02-09 NOTE — Progress Notes (Signed)
NAME:  Nicole Cunningham, MRN:  [id number], DOB:  1961-03-02, LOS: 63 ADMISSION DATE:  01/28/2022, CONSULTATION DATE:  11/6 REFERRING MD:  Dr. Sedonia Small, CHIEF COMPLAINT:  septic shock   History of Present Illness:  Patient is a 61 year old female with pertinent PMH of COPD, anemia, HTN presents to [facility] ED on 11/6 with AMS.  Patient recently admitted to [facility] with AMS on 11/2021.  Found to have UTI and pyelonephritis.  On 11/5, patient having increased confusion.  Having some SOB over the past couple of days.  Brought to [facility] ED for further eval.  Upon arrival to [facility] ED, patient confused but able to state name.  Admitted with shock, AKI, lactic acidosis of 9.0  Pertinent ED labs: CO2 14, BUN 51, creat 4.13, AST 52, alk phos 159, AG 23, WBC 1.3, Hgb 9.3, platelets 70, troponin 64 then 80.  CT head without acute abnormality.  CXR no significant findings.  CT abdomen pelvis 17 mm nonobstructive renal calculus without hydronephrosis.  Found to have E. coli bacteremia and EF 20 to 25% on echo, mixed cardiogenic and septic shock  Pertinent  Medical History   Past Medical History:  Diagnosis Date   Bronchitis    COPD (chronic obstructive pulmonary disease) ([place])    Hypertension      Significant Hospital Events: Including procedures, antibiotic start and stop dates in addition to other pertinent events   11/6 septic shock on levo , ETT >> 11/6 Echo EF 20 to 25% global hypokinesis, apex intact 11/6 RIJ HD cath >> dobutamine at 5 mics 11/7>>Dobutamine increased to 7.5 mics , 1 unit PRBC and 1 unit platelet transfusion 11/8 dobutamine increased to 10 mics 11/10 dobut wean, diuretic, pigtail  11/13 restarted levophed overnight , Short run of SVT, weaned Dobutamine to 3 , repleted K, 1 Unit PRBC per cards ( HGB 7.2)  Interim History / Subjective:  Patient became afebrile Extubated yesterday remains on nasal cannula oxygen Levophed and dobutamine were titrated off Coox 78%, CVP 1-2   Objective    Blood pressure 131/63, pulse (!) 106, temperature 98.1 F (36.7 C), temperature source Oral, resp. rate (!) 30, height _0  (1.626 m), weight 49.9 kg, SpO2 100 %. CVP:  [1 mmHg] 1 mmHg      Intake/Output Summary (Last 24 hours) at 02/09/2022 0920 Last data filed at 02/09/2022 0600 Gross per 24 hour  Intake 1953.51 ml  Output 1240 ml  Net 713.51 ml   Filed Weights   02/07/22 0131 02/08/22 0500 02/09/22 0500  Weight: 48.1 kg 49.6 kg 49.9 kg    Examination: Physical exam: General: Acute on chronically ill-appearing female, lying on the bed, on nasal cannula oxygen HEENT: [place]/AT, eyes anicteric.  moist mucus membranes, refusing oral care Neuro: Awake, intermittently following commands Chest: Coarse breath sounds, no wheezes or rhonchi Heart: Tachycardic, regular rhythm, no murmurs or gallops Abdomen: Soft, nontender, nondistended, bowel sounds present Skin: Bilateral finger dry gangrene.  Bilateral lower extremities are warm to touch there is some red rash noted on dorsum of lower legs  Resolved Hospital Problem list     Assessment & Plan:  Mixed cardiogenic and septic shock: with suspicion for sepsis cardiomyopathy.  E coli UTI E coli bacteremia AKI due to septic ATN Pancytopenia- sepsis related Septic encephalopathy COPD: On Breo Ellipta Acute hypoxic respiratory failure due to multifocal pna caused by ESBL enterobacter  L parapneumonic effusion- s/p pigtail in place Nonobstructing ureteral stone- doubt contributing to current clinical picture Ischemic upper  ext with dry gangrene- POA, worse on R Hypokalemia/hypernatremia  Continue meropenem for total of 10 days therapy Patient is afebrile, white count is trending down She is off of vasopressor support Dobutamine was titrated down now Patient made have cardiac cath probably next week Chest tube was removed Respiratory culture is grew Enterobacter Titrate oxygen via nasal cannula, with O2 sat goal 94% Outpatient  follow-up with urology Appreciate vascular surgery follow-up, recommend continuing low-dose heparin, no plans for intervention Closely monitor electrolytes and supplement Patient was successfully extubated yesterday with intention not to reintubate if she starts struggling, we will proceed with comfort care per patient's family   Best Practice (right click and "Reselect all SmartList Selections" daily)   Diet/type: tubefeeds and NPO, tube feeds DVT prophylaxis: Systemic heparin GI prophylaxis: N/A Lines: Discontinue central line Foley: Discontinue Code Status:  DNR/DNI Last date of multidisciplinary goals of care discussion [11/15 updated patient's family at bedside, they had meeting with palliative care, patient was made DNR but full scope of treatment currently.     Jacky Kindle, MD [facility] See Amion for pager If no response to pager, please call [phone] until 7pm After 7pm, Please call E-link [phone]

## 2022-02-09 NOTE — Evaluation (Signed)
Physical Therapy Evaluation Patient Details Name: Nicole Cunningham MRN: [id number] DOB: 1960/08/19 Today's Date: 02/09/2022  History of Present Illness  61 y.o. female presents to [facility] hospital on 01/29/2022 with AMS and SOB. Pt admitted with shock, AKI. CT abdomen pelvis 17 mm nonobstructive renal calculus without hydronephrosis. Pt intubated on 11/6. Pt developed acute systolic HF. Bronchoscopy 11/14 for worsening PNA. extubated 11/16. PMH includes COPD and HTN.  Clinical Impression  Pt presents to PT with deficits in strength, ROM, power, balance, endurance, and with significant pain in all limbs. Pt is profoundly weak and PT notes signs of ischemic digits and distal limbs in all extremities. Pt refuses assessment of UE ROM due to pain, LE PROM WFL. Pt will benefit from aggressive mobilization in an effort to improve strength and reduce caregiver burden. Pt provided with HEP in an effort to improve LE strength and muscle activation. PT recommends SNF placement at this time.       Recommendations for follow up therapy are one component of a multi-disciplinary discharge planning process, led by the attending physician.  Recommendations may be updated based on patient status, additional functional criteria and insurance authorization.  Follow Up Recommendations Skilled nursing-short term rehab (<3 hours/day) Can patient physically be transported by private vehicle: No    Assistance Recommended at Discharge Frequent or constant Supervision/Assistance  Patient can return home with the following  Two people to help with walking and/or transfers;Two people to help with bathing/dressing/bathroom;Assistance with cooking/housework;Assistance with feeding;Direct supervision/assist for medications management;Direct supervision/assist for financial management;Assist for transportation;Help with stairs or ramp for entrance    Equipment Recommendations  (TBD)  Recommendations for Other Services        Functional Status Assessment Patient has had a recent decline in their functional status and demonstrates the ability to make significant improvements in function in a reasonable and predictable amount of time.     Precautions / Restrictions Precautions Precautions: Fall;Other (comment) Precaution Comments: necrotic digits in all extremities Restrictions Weight Bearing Restrictions: No      Mobility  Bed Mobility Overal bed mobility: Needs Assistance             General bed mobility comments: totalA  x2 to slide up in bed, pt refuses other attempts at bed mobility including supine to sit    Transfers                        Ambulation/Gait                  Stairs            Wheelchair Mobility    Modified Rankin (Stroke Patients Only)       Balance                                             Pertinent Vitals/Pain Pain Assessment Pain Assessment: Faces Faces Pain Scale: Hurts whole lot Pain Location: all limbs Pain Descriptors / Indicators: Aching Pain Intervention(s): Premedicated before session    Home Living Family/patient expects to be discharged to:: Other (Comment) (hotel) Living Arrangements: Spouse/significant other;Children Available Help at Discharge: Family;Available PRN/intermittently Type of Home: Other(Comment) (hotel) Home Access: Level entry       Home Layout: One level Home Equipment: None      Prior Function Prior Level of Function : Independent/Modified Independent;History of  Falls (last six months)                     Hand Dominance   Dominant Hand: Right    Extremity/Trunk Assessment   Upper Extremity Assessment Upper Extremity Assessment:  (pt refuses assessment of UE ROM, reports she is unable to move UEs. PT notes discoloration to fingers, hands, and digits, cold to touch.)    Lower Extremity Assessment Lower Extremity Assessment: RLE deficits/detail;LLE  deficits/detail RLE Deficits / Details: PT notes discoloration ankle distally, cold to touch. Pt is able to produce flickers of ankle/toe movement, passive ankle DF to neutral. Knee and hip ROM WFL passively, pt demonstrates 3-/5 knee extensor strength, 2/5 hip abductor/adductor strength LLE Deficits / Details: PT notes discoloration ankle distally, cold to touch. Pt is able to produce flickers of ankle/toe movement, passive ankle DF to neutral. Knee and hip ROM WFL passively, pt demonstrates 3-/5 knee extensor strength, 2/5 hip abductor/adductor strength    Cervical / Trunk Assessment Cervical / Trunk Assessment: Kyphotic  Communication   Communication: Other (comment) (very soft spoken, unintelligible at times)  Cognition Arousal/Alertness: Awake/alert Behavior During Therapy: Flat affect Overall Cognitive Status: Difficult to assess                                 General Comments: pt appears to have slowed processing, difficult to understand attempts at speaking due to low volume of speech        General Comments General comments (skin integrity, edema, etc.): pt on 4L , tachypneic during session up to 44 observed, SpO2 stable in high 90s. PT notes nasal canula out of nostrils for majority of session. tachycardic intermittently to 125, otherwise in low 100s. BP stable.    Exercises General Exercises - Lower Extremity Ankle Circles/Pumps: PROM, Both, 5 reps Quad Sets: AROM, Both, 5 reps Gluteal Sets: AROM, Both, 5 reps Short Arc Quad: AAROM, 5 reps, Both Heel Slides: PROM, Both, 5 reps Hip ABduction/ADduction: AAROM, Both, 5 reps   Assessment/Plan    PT Assessment Patient needs continued PT services  PT Problem List Decreased strength;Decreased activity tolerance;Decreased balance;Decreased range of motion;Decreased mobility;Decreased cognition;Decreased knowledge of use of DME;Decreased safety awareness;Decreased knowledge of precautions;Cardiopulmonary status  limiting activity;Pain;Impaired sensation       PT Treatment Interventions DME instruction;Functional mobility training;Therapeutic activities;Therapeutic exercise;Balance training;Neuromuscular re-education;Cognitive remediation;Patient/family education;Wheelchair mobility training;Gait training    PT Goals (Current goals can be found in the Care Plan section)  Acute Rehab PT Goals Patient Stated Goal: to improve strength and reduce pain PT Goal Formulation: With patient Time For Goal Achievement: 02/23/22 Potential to Achieve Goals: Fair    Frequency Min 3X/week (start 3x/wk, may reduce if mobility remains limited)     Co-evaluation               AM-PAC PT "6 Clicks" Mobility  Outcome Measure Help needed turning from your back to your side while in a flat bed without using bedrails?: Total Help needed moving from lying on your back to sitting on the side of a flat bed without using bedrails?: Total Help needed moving to and from a bed to a chair (including a wheelchair)?: Total Help needed standing up from a chair using your arms (e.g., wheelchair or bedside chair)?: Total Help needed to walk in hospital room?: Total Help needed climbing 3-5 steps with a railing? : Total 6 Click Score: 6  End of Session   Activity Tolerance: Patient limited by pain Patient left: in bed;with call bell/phone within reach;with bed alarm set Nurse Communication: Mobility status;Need for lift equipment PT Visit Diagnosis: Other abnormalities of gait and mobility (R26.89);Pain;Muscle weakness (generalized) (M62.81) Pain - part of body: Ankle and joints of foot;Leg;Hand;Arm    Time: 1345-1419 PT Time Calculation (min) (ACUTE ONLY): 34 min   Charges:   PT Evaluation $PT Eval Moderate Complexity: 1 Mod          Arlyss Gandy, PT, DPT Acute Rehabilitation Office [phone]   Arlyss Gandy 02/09/2022, 2:38 PM

## 2022-02-09 NOTE — Progress Notes (Addendum)
Daily Progress Note   Patient Name: Nicole Cunningham       Date: 02/09/2022 DOB: May 28, 1960  Age: 61 y.o. MRN#: [id number] Attending Physician: Cheri Fowler, MD Primary Care Physician: [name], MD Admit Date: 01/28/2022  Reason for Consultation/Follow-up: Establishing goals of care  Patient Profile/HPI:  61 y.o. female  with past medical history of COPD, anemia, HTN presents to [facility] ED on 11/6 with AMS. The patient recently admitted to [facility] with AMS on 11/2021, found to have UTI and pyelonephritis during that admission.  On 11/5, patient having increased confusion.  Having some SOB over the past couple of days.  Brought to [facility] ED for further eval. Upon arrival to [facility] ED, patient confused but able to state name.  Admitted with shock, AKI, lactic acidosis of 9.0  She was found to have E. coli bacteremia and EF 20 to 25% on echo, mixed cardiogenic and septic shock. 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 Vascular surgery recommending heparin.  PMT was consulted for GOC conversations.   11/17- now extubated, awake, alert, smiling, nods yes to all questions, pressors off  Subjective: Chart reviewed including labs, progress notes, imaging from this and previous encounters.  On eval patient awake, alert. Smiles with interaction. Answers yes to all questions. Asking to have something "by mouth". SLP eval but not ready for po intake.  Spouse and son at bedside. No questions today. Noted plan is to continue current care, but if patient decompensates, then plan for switch to comfort. May have cardiac cath next week.   Review of Systems  Unable to perform ROS: Mental status change     Physical Exam Vitals and nursing note reviewed.  Constitutional:      Appearance: She is  ill-appearing.     Comments: frail  Cardiovascular:     Rate and Rhythm: Normal rate.  Skin:    Comments: R and left digits with dry gangrene, redness extending up to elbows  Neurological:     Mental Status: She is alert.             Vital Signs: BP (!) 84/46   Pulse (!) 132   Temp 98.1 F (36.7 C) (Oral)   Resp (!) 26   Ht 5\' 4"  (1.626 m)   Wt 49.9 kg   SpO2 100%  BMI 18.88 kg/m  SpO2: SpO2: 100 % O2 Device: O2 Device: Nasal Cannula O2 Flow Rate: O2 Flow Rate (L/min): 4 L/min  Intake/output summary:  Intake/Output Summary (Last 24 hours) at 02/09/2022 1121 Last data filed at 02/09/2022 0931 Gross per 24 hour  Intake 1662.02 ml  Output 1640 ml  Net 22.02 ml   LBM: Last BM Date : 02/08/22 Baseline Weight: Weight: 40.8 kg Most recent weight: Weight: 49.9 kg       Palliative Assessment/Data: PPS: 10%      Patient Active Problem List   Diagnosis Date Noted   Septic shock (HCC) 01/29/2022   Acute encephalopathy 01/29/2022   Protein-calorie malnutrition, severe 12/09/2021   Anxiety and depression 12/07/2021   Thrombocytopenia (HCC) 12/07/2021   Unintentional weight loss 12/06/2021   Delirium 12/06/2021   Symptomatic anemia 04/08/2021   Syncope 04/08/2021   MDD (major depressive disorder), recurrent, 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   Essential hypertension 04/25/2020   History of COVID-19 04/25/2020   History of sepsis 04/25/2020   Anemia 04/25/2020   Hypomagnesemia 04/25/2020   COVID-19 virus infection 04/20/2020   AKI (acute kidney injury) (HCC) 04/18/2020   Sepsis (HCC) 04/18/2020   Hypertensive urgency 12/15/2019   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   MDD (major depressive disorder), recurrent, in full remission (HCC) 12/14/2019   Anxiety disorder 12/14/2019    Palliative Care Assessment & Plan    Assessment/Recommendations/Plan  Continue current plan, note if patient decompensates then plan is for transition to comfort Endorsing  pain in arm/hands- will order oxycodone solution 2.5mg  per tube q4hr prn PMT will continue to follow   Code Status: DNR  Prognosis:  Unable to determine  Discharge Planning: To Be Determined  Care plan was discussed with patient's family  Thank you for allowing the Palliative Medicine Team to assist in the care of this patient.  Greater than 50%  of this time was spent counseling and coordinating care related to the above assessment and plan.  Ocie Bob, AGNP-C Palliative Medicine   Please contact Palliative Medicine Team phone at [phone] for questions and concerns.

## 2022-02-09 NOTE — Progress Notes (Signed)
ANTICOAGULATION CONSULT NOTE - Follow Up Consult  Pharmacy Consult for Heparin Indication: ischemic digits  No Active Allergies  Patient Measurements: Height: 5\' 4"  (162.6 cm) Weight: 49.9 kg (110 lb 0.2 oz) IBW/kg (Calculated) : 54.7 Heparin Dosing Weight: 48 kg  Vital Signs: Temp: 97.8 F (36.6 C) (11/17 1534) Temp Source: Oral (11/17 1534) BP: 117/64 (11/17 1200) Pulse Rate: 120 (11/17 1200)  Labs: Recent Labs    02/07/22 0310 02/07/22 1320 02/08/22 0436 02/08/22 1016 02/08/22 2116 02/09/22 0530 02/09/22 1443  HGB 7.8*  --  7.3*  --   --  7.6*  --   HCT 22.7*  --  21.4*  --   --  22.8*  --   PLT 67*  --  79*  --   --  120*  --   HEPARINUNFRC <0.10*   < >  --    < > 0.21* 0.37 0.39  CREATININE 1.79*  --  1.69*  --   --  1.50*  --    < > = values in this interval not displayed.    Estimated Creatinine Clearance: 30.2 mL/min (A) (by C-G formula based on SCr of 1.5 mg/dL (H)).   Medical History: Past Medical History:  Diagnosis Date   Bronchitis    COPD (chronic obstructive pulmonary disease) (HCC)    Hypertension     Assessment: 61 yo female presented on 11/6 with AMS found to have e coli bacteremia and mixed cardiogenic/septic shock. Course complicated by ischemic fingers and vascular consulted but unable to use IV heparin due to thrombocytopenia. Noted dark tarry stools and noted somewhat bloody smears on 11/13 per RN. Pharmacy consulted to dose heparin with low goal and no bolus now that plts >50.  Heparin level is therapeutic on current rate.  H/H low stable. Platelets improving.   Goal of Therapy:  Heparin level 0.3-0.5 units/ml Monitor platelets by anticoagulation protocol: Yes   Plan:  Continue heparin 1250 units/hr Monitor daily heparin level and CBC  12/13, PharmD, BCPS, BCCCP Clinical Pharmacist Please refer to [facility] for [facility] Pharmacy numbers 02/09/2022 4:02 PM

## 2022-02-09 NOTE — Progress Notes (Signed)
Patient ID: Nicole Cunningham, female   DOB: 1960/12/17, 61 y.o.   MRN: [id number]  Seen on afternoon rounds.  Patient has been extubated, and is able to nod her head yes and no to questions.  She does have some pain about her hands, but this appears manageable.  The overall appearance of her hands and toes are unchanged.  I am not able to appreciate a palpable pulse in either radial artery.  Her brachial pulses are palpable.  Would recommend continued heparin infusion for now.  Would allow hands and toes to demarcate.  Please call with any questions over the weekend.  Rande Brunt. Lenell Antu, MD [facility] Phone Number: [phone] 02/09/2022 4:02 PM

## 2022-02-10 LAB — CULTURE, BLOOD (ROUTINE X 2)
Culture: NO GROWTH
Culture: NO GROWTH
Special Requests: ADEQUATE
Special Requests: ADEQUATE

## 2022-02-10 LAB — CBC
HCT: 20.5 % — ABNORMAL LOW (ref 36.0–46.0)
Hemoglobin: 6.8 g/dL — CL (ref 12.0–15.0)
MCH: 24.5 pg — ABNORMAL LOW (ref 26.0–34.0)
MCHC: 33.2 g/dL (ref 30.0–36.0)
MCV: 74 fL — ABNORMAL LOW (ref 80.0–100.0)
Platelets: 171 10*3/uL (ref 150–400)
RBC: 2.77 MIL/uL — ABNORMAL LOW (ref 3.87–5.11)
WBC: 16.5 10*3/uL — ABNORMAL HIGH (ref 4.0–10.5)
nRBC: 0 % (ref 0.0–0.2)

## 2022-02-10 LAB — PREPARE RBC (CROSSMATCH)

## 2022-02-10 LAB — BASIC METABOLIC PANEL
Anion gap: 9 (ref 5–15)
BUN: 51 mg/dL — ABNORMAL HIGH (ref 8–23)
CO2: 17 mmol/L — ABNORMAL LOW (ref 22–32)
Calcium: 8.2 mg/dL — ABNORMAL LOW (ref 8.9–10.3)
Chloride: 123 mmol/L — ABNORMAL HIGH (ref 98–111)
Creatinine, Ser: 1.47 mg/dL — ABNORMAL HIGH (ref 0.44–1.00)
GFR, Estimated: 40 mL/min — ABNORMAL LOW (ref >60)
Glucose, Bld: 140 mg/dL — ABNORMAL HIGH (ref 70–99)
Potassium: 3.6 mmol/L (ref 3.5–5.1)
Sodium: 149 mmol/L — ABNORMAL HIGH (ref 135–145)

## 2022-02-10 LAB — HEPARIN LEVEL (UNFRACTIONATED): Heparin Unfractionated: 0.3 IU/mL (ref 0.30–0.70)

## 2022-02-10 LAB — GLUCOSE, CAPILLARY: Glucose-Capillary: 135 mg/dL — ABNORMAL HIGH (ref 70–99)

## 2022-02-10 MED ORDER — SODIUM CHLORIDE 0.9% IV SOLUTION: Freq: Once | INTRAVENOUS | Status: AC

## 2022-02-10 NOTE — Progress Notes (Signed)
ANTICOAGULATION CONSULT NOTE - Follow Up Consult  Pharmacy Consult for Heparin Indication: ischemic digits  No Active Allergies  Patient Measurements: Height: 5\' 4"  (162.6 cm) Weight: 55.5 kg (122 lb 5.7 oz) IBW/kg (Calculated) : 54.7 Heparin Dosing Weight: 48 kg  Vital Signs: Temp: 99.5 F (37.5 C) (11/18 1104) Temp Source: Axillary (11/18 1104) BP: 150/80 (11/18 1104) Pulse Rate: 130 (11/18 1104)  Labs: Recent Labs    02/08/22 0436 02/08/22 1016 02/09/22 0530 02/09/22 1443 02/10/22 0534 02/10/22 1228  HGB 7.3*  --  7.6*  --  6.8*  --   HCT 21.4*  --  22.8*  --  20.5*  --   PLT 79*  --  120*  --  171  --   HEPARINUNFRC  --    < > 0.37 0.39  --  0.30  CREATININE 1.69*  --  1.50*  --  1.47*  --    < > = values in this interval not displayed.     Estimated Creatinine Clearance: 33.8 mL/min (A) (by C-G formula based on SCr of 1.47 mg/dL (H)).   Medical History: Past Medical History:  Diagnosis Date   Bronchitis    COPD (chronic obstructive pulmonary disease) (HCC)    Hypertension     Assessment: 61 yo female presented on 11/6 with AMS found to have e coli bacteremia and mixed cardiogenic/septic shock. Course complicated by ischemic fingers and vascular consulted but unable to use IV heparin due to thrombocytopenia. Noted dark tarry stools and noted somewhat bloody smears on 11/13 per RN. Pharmacy consulted to dose heparin with low goal and no bolus now that plts >50.  Heparin level is therapeutic at 0.3, Hgb down but thought to be anemia, no S/Sx active bleeding documented.  Goal of Therapy:  Heparin level 0.3-0.5 units/ml Monitor platelets by anticoagulation protocol: Yes   Plan:  Continue heparin 1250 units/hr Monitor daily heparin level and CBC  12/13, PharmD, [place], [facility] Clinical Pharmacist [phone] Please check AMION for all [facility] Pharmacy numbers 02/10/2022

## 2022-02-10 NOTE — Plan of Care (Signed)
  Problem: Clinical Measurements: Goal: Cardiovascular complication will be avoided Outcome: Progressing   Problem: Nutrition: Goal: Adequate nutrition will be maintained Outcome: Progressing   Problem: Elimination: Goal: Will not experience complications related to bowel motility Outcome: Progressing Goal: Will not experience complications related to urinary retention Outcome: Progressing   Problem: Clinical Measurements: Goal: Respiratory complications will improve Outcome: Not Progressing   Problem: Activity: Goal: Risk for activity intolerance will decrease Outcome: Not Progressing   Problem: Pain Managment: Goal: General experience of comfort will improve Outcome: Not Progressing   Problem: Activity: Goal: Ability to tolerate increased activity will improve Outcome: Not Progressing   Problem: Role Relationship: Goal: Method of communication will improve Outcome: Not Progressing

## 2022-02-10 NOTE — Progress Notes (Signed)
Rounding Note    Patient Name: Nicole Cunningham Date of Encounter: 02/10/2022  [facility] Cardiologist: None   Subjective   NAEO. Tachycardic this AM Hgb dropped this am, PRBC transfusion planned.  Inpatient Medications    Scheduled Meds:  sodium chloride   Intravenous Once   arformoterol  15 mcg Nebulization BID   budesonide (PULMICORT) nebulizer solution  0.25 mg Nebulization BID   Chlorhexidine Gluconate Cloth  6 each Topical Q0600   docusate  100 mg Per Tube BID   FLUoxetine  20 mg Per Tube Daily   free water  200 mL Per Tube Q4H   insulin aspart  0-6 Units Subcutaneous Q4H   isosorbide-hydrALAZINE  0.5 tablet Per Tube TID   mouth rinse  15 mL Mouth Rinse 4 times per day   polyethylene glycol  17 g Per Tube Daily   QUEtiapine  50 mg Per Tube BID   sodium chloride flush  10-40 mL Intracatheter Q12H   Continuous Infusions:  sodium chloride Stopped (02/09/22 2311)   sodium chloride Stopped (02/09/22 2311)   feeding supplement (VITAL 1.5 CAL) 50 mL/hr at 02/10/22 0628   heparin 1,250 Units/hr (02/10/22 0628)   meropenem (MERREM) IV Stopped (02/09/22 2157)   PRN Meds: sodium chloride, acetaminophen, ipratropium-albuterol, lip balm, mouth rinse, oxyCODONE, phenol, sodium chloride flush   Vital Signs    Vitals:   02/10/22 0400 02/10/22 0743 02/10/22 0850 02/10/22 0854  BP: 138/66 (!) 144/75    Pulse: (!) 113 (!) 130    Resp: (!) 27 (!) 35    Temp: 98.9 F (37.2 C) 99.4 F (37.4 C)    TempSrc: Oral Axillary    SpO2: 100% 100% 100% 100%  Weight: 55.5 kg     Height:        Intake/Output Summary (Last 24 hours) at 02/10/2022 0909 Last data filed at 02/10/2022 1610 Gross per 24 hour  Intake 2461.99 ml  Output 800 ml  Net 1661.99 ml      02/10/2022    4:00 AM 02/09/2022   10:49 PM 02/09/2022    5:00 AM  Last 3 Weights  Weight (lbs) 122 lb 5.7 oz 122 lb 2.2 oz 110 lb 0.2 oz  Weight (kg) 55.5 kg 55.4 kg 49.9 kg      Telemetry    Sinus  tach - Personally Reviewed  ECG    Personally Reviewed  Physical Exam   GEN: No acute distress.  Chronically ill appearing. Does not interact. Neck: No JVD Cardiac: tachycardic, regular rhythm, no murmurs, rubs, or gallops.  Respiratory: Clear to auscultation bilaterally. GI: Soft, nontender, non-distended  MS: No edema; fingers are ischemic. L>R. Neuro:  Nonfocal  Psych: Normal affect   Labs    High Sensitivity Troponin:   Recent Labs  Lab 01/28/22 2234 01/29/22 0028  TROPONINIHS 64* 80*     Chemistry Recent Labs  Lab 02/06/22 0219 02/07/22 0310 02/08/22 0436 02/09/22 0530 02/10/22 0534  NA 152*   < > 149* 149* 149*  K 3.9   < > 4.5 4.1 3.6  CL 124*   < > 121* 123* 123*  CO2 22   < > 21* 19* 17*  GLUCOSE 191*   < > 180* 136* 140*  BUN 44*   < > 51* 48* 51*  CREATININE 1.88*   < > 1.69* 1.50* 1.47*  CALCIUM 7.4*   < > 7.8* 8.1* 8.2*  MG 1.3*  --   --   --   --  PROT 4.4*  --   --   --   --   ALBUMIN <1.5*  --   --   --   --   AST 135*  --   --   --   --   ALT 10  --   --   --   --   ALKPHOS 69  --   --   --   --   BILITOT 2.5*  --   --   --   --   GFRNONAA 30*   < > 34* 39* 40*  ANIONGAP 6   < > 7 7 9    < > = values in this interval not displayed.    Lipids No results for input(s): "CHOL", "TRIG", "HDL", "LABVLDL", "LDLCALC", "CHOLHDL" in the last 168 hours.  Hematology Recent Labs  Lab 02/08/22 0436 02/09/22 0530 02/10/22 0534  WBC 20.1* 19.1* 16.5*  RBC 2.85* 3.04* 2.77*  HGB 7.3* 7.6* 6.8*  HCT 21.4* 22.8* 20.5*  MCV 75.1* 75.0* 74.0*  MCH 25.6* 25.0* 24.5*  MCHC 34.1 33.3 33.2  RDW 30.5* 30.7* Not Measured  PLT 79* 120* 171   Thyroid No results for input(s): "TSH", "FREET4" in the last 168 hours.  BNPNo results for input(s): "BNP", "PROBNP" in the last 168 hours.  DDimer No results for input(s): "DDIMER" in the last 168 hours.   Radiology    DG CHEST PORT 1 VIEW  Result Date: 02/08/2022 CLINICAL DATA:  Intubation, chest tube, pleural  effusion EXAM: PORTABLE CHEST 1 VIEW COMPARISON:  Portable exam 0929 hours compared to 02/06/2022 FINDINGS: Tip of endotracheal tube projects 2.4 cm above carina. Feeding tube traverses stomach into proximal small bowel. Nasogastric tube tip projects over stomach. LEFT arm PICC line tip projects over SVC. RIGHT jugular central venous catheter tip projects over SVC. Pigtail LEFT thoracostomy tube unchanged. Normal heart size, mediastinal contours, and pulmonary vascularity. Atherosclerotic calcification aorta. Improved pulmonary infiltrates. Improved atelectasis versus consolidation LEFT lower lobe. No pleural effusion or pneumothorax. IMPRESSION: Improved aeration bilaterally. LEFT thoracostomy tube without pneumothorax. Aortic Atherosclerosis (ICD10-I70.0). Electronically Signed   By: 02/08/2022 M.D.   On: 02/08/2022 09:39      Assessment & Plan   #Shock Improving.  #Acute systolic hF EF20% Will eventually need LHC/RHC but not now given abnormal labs, mental status No additional diuresis.  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

## 2022-02-10 NOTE — Progress Notes (Signed)
Speech Language Pathology Treatment: Dysphagia  Patient Details Name: Nicole Cunningham MRN: [id number] DOB: [birth year] Today's Date: 02/10/2022 Time: 2353-6144 SLP Time Calculation (min) (ACUTE ONLY): 42 min  Assessment / Plan / Recommendation Clinical Impression  Nicole Cunningham was transferred out of ICU. Alert and participatory.  Set up oral suctioning equipment and provided oral care.  Pt pleased to be able to have some ice.  Initial swallows were followed by some coughing as she cleared mucous from her pharynx.  Voice quality and volume improved as session progressed.  (Pt stated "its so refreshing" in reference to water.) Required min verbal cues to take small sips. She continues to exhibit s/s of a post-extubation dysphagia, but is improving.  Recommend allowing ice chips and small sips of water after oral care.  Pt will need instrumental swallow study in the next few days. D/W RN. SLP will follow.    HPI HPI: Patient is a 61 year old female who presented to [facility] ED on 11/6 with AMS, UTI and pyelonephritis, cardiogenic and septic shock, AKI, acute hypoxic respiratory failure due to multifocal pna caused by ESBL enterobacter, L parapneumonic effusion, bilateral finger dry gangrene.  ETT 11/6-11/16. PMH of COPD, anemia, HTN. Failed Yale swallow screen after extubation.      SLP Plan  Continue with current plan of care      Recommendations for follow up therapy are one component of a multi-disciplinary discharge planning process, led by the attending physician.  Recommendations may be updated based on patient status, additional functional criteria and insurance authorization.    Recommendations  Diet recommendations: NPO Liquids provided via: Teaspoon;Straw Medication Administration: Via alternative means Supervision:  (total assist) Compensations: Small sips/bites                Oral Care Recommendations: Oral care QID Follow Up Recommendations: Other (comment) (tba) SLP Visit  Diagnosis: Dysphagia, oropharyngeal phase (R13.12) Plan: Continue with current plan of care        Nicole Cunningham L. Nicole Frederic, MA CCC/SLP Clinical Specialist - Acute Care SLP Acute Rehabilitation Services Office number [phone]    Nicole Cunningham  02/10/2022, 7:48 AM

## 2022-02-10 NOTE — Progress Notes (Signed)
PROGRESS NOTE  Nicole Cunningham  XLK:[id number] DOB: Aug 10, 1960 DOA: 01/28/2022 PCP: [name], MD   Brief Narrative: Patient is a 8 old female with history of COPD, anemia, hypertension who was brought to the emergency department on 11/6 with altered mental status, shortness of breath.  On presentation, lab work showed creatinine of 4.1, lactic acid of 9.  CT head, chest x-ray did not show any acute findings.  CT abdomen/pelvis showed 17 mm nonobstructing renal calculus without hydronephrosis.  Patient was admitted for the management of septic shock under ICU service.  Patient had a prolonged hospital course, found to have E. coli bacteremia, echo showed EF of 20 to 25% for which cardiology consulted.  Patient was managed for cardiogenic/septic shock.  Transferred to [facility] service on 11/18.  Significant events as per PCCM :  11/6 septic shock on levo , ETT >> 11/6 Echo EF 20 to 25% global hypokinesis, apex intact 11/6 RIJ HD cath >> dobutamine at 5 mics 11/7>>Dobutamine increased to 7.5 mics , 1 unit PRBC and 1 unit platelet transfusion 11/8 dobutamine increased to 10 mics 11/10 dobut wean, diuretic, pigtail  11/13 restarted levophed overnight , Short run of SVT, weaned Dobutamine to 3 , repleted K, 1 Unit PRBC per cards ( HGB 7.2)  Assessment & Plan:  Principal Problem:   Septic shock (HCC) Active Problems:   Sepsis (HCC)   Acute encephalopathy   Pressure injury of skin   Shock: Mixed cardiogenic and septic shock.  Suspicion for sepsis cardiomyopathy.  Heart failure team following, plan for cardiac cath when appropriate.  Dobutamine stopped.  Off vasopressors. Remains in sinus tachycardia with stable blood pressure.  Monitor on telemetry  Bacteremia: Blood culture/urine culture showed E. coli.  Currently on meropenem, plan for 10 days total course.  Leukocytosis is improving.  Patient is afebrile.  Repeat blood cultures have been negative which was sent on 11/13.  AKI: Likely  from ATN from shock.  Kidney function slowly improving.  Creatinine in the range of 1.4.  Pancytopenia/microcytic anemia.: Thought to be sepsis related.  Hemoglobin dropped to the range of 6 today.  She was given a unit of blood transfusion on 11/13, will be transfused another unit today.  We will check iron studies.  Monitor CBC  Acute hypoxic respiratory failure secondary to multifocal pneumonia: History of COPD.  Currently on Brovana, Pulmicort, DuoNeb. currently on 2 L of oxygen via nasal cannula.  Tracheal aspirate has shown Enterobacter cloacae.  Continue current antibiotics. Patient was also found to have left parapneumonic effusion.  Status post pigtail placement, now has been removed.  Extubated on 11/16 with no intention to intubate.  Acute systolic CHF: Heart failure team following.  Echo showed EF of 20 to 25% with severe hypokinesis, normal RV, dilated IVC.  Heart failure team planning for left and right heart cath after improvement in the kidney function, mental status.  Septic encephalopathy:   CT head on presentation did not show any acute findings.  Currently unable to swallow, speech therapy following and recommending n.p.o.  Currently on tube feeding.  Speech therapy should continue to follow and evaluate her swallowing so that the feeding can be discontinued.  Started on Seroquel for agitation by PCCM team.  Might need to be titrated. Mental status has significantly improved and she is currently alert and oriented.  Obeys commands  Upper extremity ischemia with dry gangrene: Present on admission.  Worse on the right side.  Also suspected to have worsening from pressures  causing vaso constriction.Vascular surgery following.  Currently on low-dose heparin.  No plan for intervention.  Rx for autoamputation.  Hypokalemia/hypernatremia: Continue to monitor electrolytes.  Continue free water boluses for hyponatremia.  Nonobstructing ureteral stone: We recommend follow-up with urology as  an outpatient.  Goals of care/deconditioning/debility: Palliative care closely following.  Currently DNR.  Needs to continue discussing goals of care.  PT/OT recommending skilled facility on discharge.  After discussion with family, they are agreeable for comfort care if she declines further.    Nutrition Problem: Severe Malnutrition Etiology: chronic illness (COPD) Pressure Injury 02/08/22 Buttocks Right Stage 2 -  Partial thickness loss of dermis presenting as a shallow open injury with a red, pink wound bed without slough. (Active)  02/08/22 0800  Location: Buttocks  Location Orientation: Right  Staging: Stage 2 -  Partial thickness loss of dermis presenting as a shallow open injury with a red, pink wound bed without slough.  Wound Description (Comments):   Present on Admission:   Dressing Type Foam - Lift dressing to assess site every shift 02/09/22 2249    DVT prophylaxis:Place and maintain sequential compression device Start: 01/29/22 1021     Code Status: DNR  Family Communication: None at bedside  Patient status:Inpatient  Patient is from :Home  Anticipated discharge to:Not sure  Estimated DC date:Not sure   Consultants: Cardiology, PCCM, vascular surgery, palliative care  Procedures: Intubation, chest tube placement  Antimicrobials:  Anti-infectives (From admission, onward)    Start     Dose/Rate Route Frequency Ordered Stop   02/04/22 1345  meropenem (MERREM) 1 g in sodium chloride 0.9 % 100 mL IVPB        1 g 200 mL/hr over 30 Minutes Intravenous Every 12 hours 02/04/22 1258 02/13/22 2359   02/01/22 2200  ceFAZolin (ANCEF) IVPB 2g/100 mL premix  Status:  Discontinued        2 g 200 mL/hr over 30 Minutes Intravenous Every 12 hours 02/01/22 0754 02/04/22 1258   01/29/22 2200  ceFEPIme (MAXIPIME) 1 g in sodium chloride 0.9 % 100 mL IVPB  Status:  Discontinued        1 g 200 mL/hr over 30 Minutes Intravenous Every 24 hours 01/29/22 0046 01/29/22 0242    01/29/22 2200  cefTRIAXone (ROCEPHIN) 2 g in sodium chloride 0.9 % 100 mL IVPB  Status:  Discontinued        2 g 200 mL/hr over 30 Minutes Intravenous Every 24 hours 01/29/22 0242 02/01/22 0754   01/29/22 0048  vancomycin variable dose per unstable renal function (pharmacist dosing)  Status:  Discontinued         Does not apply See admin instructions 01/29/22 0048 01/29/22 1343   01/28/22 2230  ceFEPIme (MAXIPIME) 2 g in sodium chloride 0.9 % 100 mL IVPB        2 g 200 mL/hr over 30 Minutes Intravenous  Once 01/28/22 2228 01/28/22 2350   01/28/22 2230  metroNIDAZOLE (FLAGYL) IVPB 500 mg        500 mg 100 mL/hr over 60 Minutes Intravenous  Once 01/28/22 2228 01/29/22 0021   01/28/22 2230  vancomycin (VANCOCIN) IVPB 1000 mg/200 mL premix        1,000 mg 200 mL/hr over 60 Minutes Intravenous  Once 01/28/22 2228 01/29/22 0150       Subjective: Patient seen and examined at bedside today.  EKG monitor showed sinus tachycardia with stable blood pressure.  She looks comfortable, alert and oriented, aware about date and  time.  Denies discomfort.  On tube feeding.  When asked if she can swallow she states she is not ready for that.  Looks very deconditioned and weak.  Objective: Vitals:   02/09/22 2230 02/09/22 2249 02/10/22 0400 02/10/22 0743  BP:  123/62 138/66 (!) 144/75  Pulse: (!) 123 (!) 116 (!) 113 (!) 130  Resp:  (!) 32 (!) 27 (!) 35  Temp:  99 F (37.2 C) 98.9 F (37.2 C) 99.4 F (37.4 C)  TempSrc:  Axillary Oral Axillary  SpO2: 100% 100% 100% 100%  Weight:  55.4 kg 55.5 kg   Height:        Intake/Output Summary (Last 24 hours) at 02/10/2022 0800 Last data filed at 02/10/2022 1308 Gross per 24 hour  Intake 2544.53 ml  Output 800 ml  Net 1744.53 ml   Filed Weights   02/09/22 0500 02/09/22 2249 02/10/22 0400  Weight: 49.9 kg 55.4 kg 55.5 kg    Examination:  General exam: Very deconditioned, weak, chronically ill looking HEENT: PERRL, feeding tube Respiratory system:   no wheezes or crackles ,diminished air sounds on bases Cardiovascular system: Sinus tachycardia Gastrointestinal system: Abdomen is mildly distended, soft and nontender.BS heard Central nervous system: Alert and oriented, obeys commands Extremities: Bilateral lower extremity edema, upper extremity edema, dry gangrene on both upper extremities, cool upper extremities Skin: no icterus or pallor   Data Reviewed: I have personally reviewed following labs and imaging studies  CBC: Recent Labs  Lab 02/06/22 0219 02/07/22 0310 02/08/22 0436 02/09/22 0530 02/10/22 0534  WBC 24.5* 21.3* 20.1* 19.1* 16.5*  NEUTROABS 21.6*  --   --   --   --   HGB 8.3* 7.8* 7.3* 7.6* 6.8*  HCT 24.6* 22.7* 21.4* 22.8* 20.5*  MCV 75.0* 74.2* 75.1* 75.0* 74.0*  PLT 57* 67* 79* 120* 171   Basic Metabolic Panel: Recent Labs  Lab 02/06/22 0219 02/07/22 0310 02/08/22 0436 02/09/22 0530 02/10/22 0534  NA 152* 148* 149* 149* 149*  K 3.9 3.4* 4.5 4.1 3.6  CL 124* 121* 121* 123* 123*  CO2 22 21* 21* 19* 17*  GLUCOSE 191* 188* 180* 136* 140*  BUN 44* 43* 51* 48* 51*  CREATININE 1.88* 1.79* 1.69* 1.50* 1.47*  CALCIUM 7.4* 7.2* 7.8* 8.1* 8.2*  MG 1.3*  --   --   --   --      Recent Results (from the past 240 hour(s))  Culture, Respiratory w Gram Stain     Status: None   Collection Time: 02/02/22  8:17 AM   Specimen: Tracheal Aspirate; Respiratory  Result Value Ref Range Status   Specimen Description TRACHEAL ASPIRATE  Final   Special Requests NONE  Final   Gram Stain   Final    ABUNDANT WBC PRESENT, PREDOMINANTLY PMN ABUNDANT GRAM NEGATIVE RODS RARE GRAM POSITIVE COCCI IN PAIRS Performed at [facility], [address], [place], [place] [address]    Culture ABUNDANT ENTEROBACTER CLOACAE  Final   Report Status 02/05/2022 FINAL  Final   Organism ID, Bacteria ENTEROBACTER CLOACAE  Final      Susceptibility   Enterobacter cloacae - MIC*    CEFAZOLIN >=64 RESISTANT Resistant     CEFEPIME >=32  RESISTANT Resistant     CEFTAZIDIME >=64 RESISTANT Resistant     CIPROFLOXACIN <=0.25 SENSITIVE Sensitive     GENTAMICIN <=1 SENSITIVE Sensitive     IMIPENEM <=0.25 SENSITIVE Sensitive     TRIMETH/SULFA <=20 SENSITIVE Sensitive     PIP/TAZO >=  128 RESISTANT Resistant     * ABUNDANT ENTEROBACTER CLOACAE  Culture, body fluid w Gram Stain-bottle     Status: None   Collection Time: 02/02/22  1:18 PM   Specimen: Pleura  Result Value Ref Range Status   Specimen Description PLEURAL  Final   Special Requests BOTTLES DRAWN AEROBIC AND ANAEROBIC  Final   Culture   Final    NO GROWTH 5 DAYS Performed at [facility], [address], [place], [place] [address]    Report Status 02/07/2022 FINAL  Final  Gram stain     Status: None   Collection Time: 02/02/22  1:18 PM   Specimen: Pleura  Result Value Ref Range Status   Specimen Description PLEURAL  Final   Special Requests NONE  Final   Gram Stain   Final    WBC PRESENT,BOTH PMN AND MONONUCLEAR NO ORGANISMS SEEN CYTOSPIN SMEAR Performed at [facility], [address], [place], [place] [address]    Report Status 02/02/2022 FINAL  Final  Culture, blood (Routine X 2) w Reflex to ID Panel     Status: None   Collection Time: 02/05/22 11:38 AM   Specimen: BLOOD  Result Value Ref Range Status   Specimen Description BLOOD PICC LINE  Final   Special Requests   Final    BOTTLES DRAWN AEROBIC AND ANAEROBIC Blood Culture adequate volume   Culture   Final    NO GROWTH 5 DAYS Performed at [facility], [address], [place], [place] [address]    Report Status 02/10/2022 FINAL  Final  Culture, blood (Routine X 2) w Reflex to ID Panel     Status: None   Collection Time: 02/05/22 11:38 AM   Specimen: BLOOD  Result Value Ref Range Status   Specimen Description BLOOD PICC LINE  Final   Special Requests   Final    BOTTLES DRAWN AEROBIC AND ANAEROBIC Blood Culture adequate volume   Culture   Final    NO GROWTH 5 DAYS Performed at  [facility], [address] [place], [place] [address]    Report Status 02/10/2022 FINAL  Final  Culture, Respiratory w Gram Stain     Status: None   Collection Time: 02/06/22 10:56 AM   Specimen: Tracheal Aspirate; Respiratory  Result Value Ref Range Status   Specimen Description TRACHEAL ASPIRATE  Final   Special Requests NONE  Final   Gram Stain   Final    NO WBC SEEN NO ORGANISMS SEEN Performed at [facility], [address] [place], [place] [address]    Culture MODERATE ENTEROBACTER CLOACAE  Final   Report Status 02/08/2022 FINAL  Final   Organism ID, Bacteria ENTEROBACTER CLOACAE  Final      Susceptibility   Enterobacter cloacae - MIC*    CEFAZOLIN >=64 RESISTANT Resistant     CEFEPIME 2 SENSITIVE Sensitive     CEFTAZIDIME >=64 RESISTANT Resistant     CIPROFLOXACIN <=0.25 SENSITIVE Sensitive     GENTAMICIN <=1 SENSITIVE Sensitive     IMIPENEM <=0.25 SENSITIVE Sensitive     TRIMETH/SULFA <=20 SENSITIVE Sensitive     PIP/TAZO >=128 RESISTANT Resistant     * MODERATE ENTEROBACTER CLOACAE     Radiology Studies: DG CHEST PORT 1 VIEW  Result Date: 02/08/2022 CLINICAL DATA:  Intubation, chest tube, pleural effusion EXAM: PORTABLE CHEST 1 VIEW COMPARISON:  Portable exam 0929 hours compared to 02/06/2022 FINDINGS: Tip of endotracheal tube projects  2.4 cm above carina. Feeding tube traverses stomach into proximal small bowel. Nasogastric tube tip projects over stomach. LEFT arm PICC line tip projects over SVC. RIGHT jugular central venous catheter tip projects over SVC. Pigtail LEFT thoracostomy tube unchanged. Normal heart size, mediastinal contours, and pulmonary vascularity. Atherosclerotic calcification aorta. Improved pulmonary infiltrates. Improved atelectasis versus consolidation LEFT lower lobe. No pleural effusion or pneumothorax. IMPRESSION: Improved aeration bilaterally. LEFT thoracostomy tube without pneumothorax. Aortic Atherosclerosis (ICD10-I70.0).  Electronically Signed   By: Ulyses Southward M.D.   On: 02/08/2022 09:39    Scheduled Meds:  sodium chloride   Intravenous Once   arformoterol  15 mcg Nebulization BID   budesonide (PULMICORT) nebulizer solution  0.25 mg Nebulization BID   Chlorhexidine Gluconate Cloth  6 each Topical Q0600   docusate  100 mg Per Tube BID   FLUoxetine  20 mg Per Tube Daily   free water  200 mL Per Tube Q4H   insulin aspart  0-6 Units Subcutaneous Q4H   isosorbide-hydrALAZINE  0.5 tablet Per Tube TID   mouth rinse  15 mL Mouth Rinse 4 times per day   polyethylene glycol  17 g Per Tube Daily   QUEtiapine  50 mg Per Tube BID   sodium chloride flush  10-40 mL Intracatheter Q12H   Continuous Infusions:  sodium chloride Stopped (02/09/22 2311)   sodium chloride Stopped (02/09/22 2311)   feeding supplement (VITAL 1.5 CAL) 50 mL/hr at 02/10/22 0628   heparin 1,250 Units/hr (02/10/22 0628)   meropenem (MERREM) IV Stopped (02/09/22 2157)     LOS: 12 days   Burnadette Pop, MD [facility] P11/18/2023, 8:00 AM

## 2022-02-10 NOTE — Social Work (Signed)
CSW attempted to meet with pt in regards to SNF placement. Pt did not awaken after several calling of her name. SNF placement has potential barriers due to no insurance.   TOC team will continue to assist with discharge planning needs.

## 2022-02-11 LAB — CBC
HCT: 25.5 % — ABNORMAL LOW (ref 36.0–46.0)
Hemoglobin: 8.7 g/dL — ABNORMAL LOW (ref 12.0–15.0)
MCH: 26.1 pg (ref 26.0–34.0)
MCHC: 34.1 g/dL (ref 30.0–36.0)
MCV: 76.6 fL — ABNORMAL LOW (ref 80.0–100.0)
Platelets: 227 10*3/uL (ref 150–400)
RBC: 3.33 MIL/uL — ABNORMAL LOW (ref 3.87–5.11)
RDW: 27.1 % — ABNORMAL HIGH (ref 11.5–15.5)
WBC: 14.1 10*3/uL — ABNORMAL HIGH (ref 4.0–10.5)
nRBC: 0 % (ref 0.0–0.2)

## 2022-02-11 LAB — TYPE AND SCREEN
ABO/RH(D): AB POS
Antibody Screen: NEGATIVE
Unit division: 0

## 2022-02-11 LAB — BASIC METABOLIC PANEL
Anion gap: 6 (ref 5–15)
BUN: 50 mg/dL — ABNORMAL HIGH (ref 8–23)
CO2: 16 mmol/L — ABNORMAL LOW (ref 22–32)
Calcium: 8.2 mg/dL — ABNORMAL LOW (ref 8.9–10.3)
Chloride: 130 mmol/L — ABNORMAL HIGH (ref 98–111)
Creatinine, Ser: 1.38 mg/dL — ABNORMAL HIGH (ref 0.44–1.00)
GFR, Estimated: 43 mL/min — ABNORMAL LOW (ref >60)
Glucose, Bld: 151 mg/dL — ABNORMAL HIGH (ref 70–99)
Potassium: 3.6 mmol/L (ref 3.5–5.1)
Sodium: 152 mmol/L — ABNORMAL HIGH (ref 135–145)

## 2022-02-11 LAB — BPAM RBC
Blood Product Expiration Date: 202312162359
ISSUE DATE / TIME: 202311181510
Unit Type and Rh: 8400

## 2022-02-11 LAB — GLUCOSE, CAPILLARY
Glucose-Capillary: 128 mg/dL — ABNORMAL HIGH (ref 70–99)
Glucose-Capillary: 128 mg/dL — ABNORMAL HIGH (ref 70–99)
Glucose-Capillary: 110 mg/dL — ABNORMAL HIGH (ref 70–99)
Glucose-Capillary: 136 mg/dL — ABNORMAL HIGH (ref 70–99)

## 2022-02-11 LAB — HEPARIN LEVEL (UNFRACTIONATED): Heparin Unfractionated: 0.33 IU/mL (ref 0.30–0.70)

## 2022-02-11 NOTE — Progress Notes (Signed)
   02/11/22 1900  Assess: MEWS Score  Temp 97.9 F (36.6 C)  BP 134/67  MAP (mmHg) 85  Pulse Rate (!) 116  ECG Heart Rate (!) 116  Resp (!) 42  SpO2 99 %  Assess: MEWS Score  MEWS Temp 0  MEWS Systolic 0  MEWS Pulse 2  MEWS RR 3  MEWS LOC 0  MEWS Score 5  MEWS Score Color Red  Assess: if the MEWS score is Yellow or Red  Were vital signs taken at a resting state? Yes  Focused Assessment No change from prior assessment  Does the patient meet 2 or more of the SIRS criteria? Yes  Does the patient have a confirmed or suspected source of infection? Yes  Provider and Rapid Response Notified? No  MEWS guidelines implemented *See Row Information* No, previously red, continue vital signs every 4 hours  Take Vital Signs  Increase Vital Sign Frequency  Red: Q 1hr X 4 then Q 4hr X 4, if remains red, continue Q 4hrs  Notify: Charge Nurse/RN  Name of Charge Nurse/RN Notified Jessica, RN  Date Charge Nurse/RN Notified 02/11/22  Time Charge Nurse/RN Notified 2143  Provider Notification  Provider Name/Title Dr. Odie Sera  Date Provider Notified 02/11/22  Time Provider Notified 2143  Method of Notification Page (Secure chat)  Notification Reason Other (Comment) (MEWS)  Provider response No new orders  Date of Provider Response 02/11/22  Time of Provider Response 2143  Assess: SIRS CRITERIA  SIRS Temperature  0  SIRS Pulse 1  SIRS Respirations  1  SIRS WBC 1  SIRS Score Sum  3

## 2022-02-11 NOTE — TOC Progression Note (Signed)
Transition of Care [facility]) - Progression Note    Patient Details  Name: [name] MRN: [id number] Date of Birth: 27-Dec-1960  [facility] of [facility] CM/SW Contact  Carmina Miller, LCSWA Phone Number: 02/11/2022, 10:47 AM  Clinical Narrative:    CSW spoke with pt's spouse Peyton Najjar via phone as pt is not responding to commands. CSW discussed MD/PT recommendations for SNF placement at dc for STR. Pt's spouse agreeable, however CSW did explain a potential barrier that pt does not currently have any health insurance. Pt's spouse understands.    Expected Discharge Plan: Home w Home Health Services Barriers to Discharge: Continued Medical Work up  Expected Discharge Plan and Services Expected Discharge Plan: Home w Home Health Services   Discharge Planning Services: CM Consult   Living arrangements for the past 2 months: Hotel/Motel                                       Social Determinants of Health (SDOH) Interventions    Readmission Risk Interventions     No data to display

## 2022-02-11 NOTE — Progress Notes (Signed)
PROGRESS NOTE    Nicole Cunningham  WUJ:[id number] DOB: [birth year] DOA: 01/28/2022 PCP: [name], MD   Brief Narrative:  Patient is a 77 old female with history of COPD, anemia, hypertension who was brought to the emergency department on 11/6 with altered mental status, shortness of breath.  On presentation, lab work showed creatinine of 4.1, lactic acid of 9.  CT head, chest x-ray did not show any acute findings.  CT abdomen/pelvis showed 17 mm nonobstructing renal calculus without hydronephrosis.  Patient was admitted for the management of septic shock under ICU service.  Patient had a prolonged hospital course, found to have E. coli bacteremia, echo showed EF of 20 to 25% for which cardiology consulted.  Patient was managed for cardiogenic/septic shock.  Transferred to [facility] service on 11/18.   Significant events as per PCCM :   11/6 septic shock on levo , ETT >> 11/6 Echo EF 20 to 25% global hypokinesis, apex intact 11/6 RIJ HD cath >> dobutamine at 5 mics 11/7>>Dobutamine increased to 7.5 mics , 1 unit PRBC and 1 unit platelet transfusion 11/8 dobutamine increased to 10 mics 11/10 dobut wean, diuretic, pigtail  11/13 restarted levophed overnight , Short run of SVT, weaned Dobutamine to 3 , repleted K, 1 Unit PRBC per cards ( HGB 7.2)  Assessment & Plan:   Principal Problem:   Septic shock (HCC) Active Problems:   Sepsis (HCC)   Acute encephalopathy   Pressure injury of skin  Shock: Mixed cardiogenic and septic shock.  Suspicion for sepsis cardiomyopathy.  Heart failure team following, plan for cardiac cath when appropriate.  Dobutamine stopped.  Off vasopressors. Remains in sinus tachycardia with stable blood pressure.  Monitor on telemetry   Bacteremia: Blood culture/urine culture showed E. coli.  Currently on meropenem, plan for 10 days total course.  Leukocytosis is improving.  Continues to have intermittent fever.  Repeat blood cultures have been negative which was sent on  11/13.   AKI: Likely from ATN from shock.  Kidney function slowly improving.     Pancytopenia/microcytic anemia.: Thought to be sepsis related.  She has thus far received 4 units of PRBC transfusion and 2 units of platelet transfusion during this hospitalization.  Hemoglobin dropped to the range of 6 and she received 1 unit of PRBC transfusion on 02/11/2022.  Monitor CBC daily.   Acute hypoxic respiratory failure secondary to multifocal pneumonia: History of COPD.  Currently on Brovana, Pulmicort, DuoNeb. currently on 2 L of oxygen via nasal cannula.  Tracheal aspirate has shown Enterobacter cloacae.  Continue current antibiotics. Patient was also found to have left parapneumonic effusion.  Status post pigtail placement, now has been removed.  Extubated on 11/16 with no intention to intubate.   Acute systolic CHF: Heart failure team following.  Echo showed EF of 20 to 25% with severe hypokinesis, normal RV, dilated IVC.  Heart failure team planning for left and right heart cath after improvement in the kidney function, mental status.   Septic encephalopathy:   CT head on presentation did not show any acute findings.  Currently unable to swallow, speech therapy following and recommending n.p.o.  Currently on tube feeding.  Speech therapy should continue to follow and evaluate her swallowing so that the feeding can be discontinued.  Started on Seroquel for agitation by PCCM team.  Might need to be titrated. Mental status has significantly improved and she is currently alert and oriented.  Follows commands.  However she is too weak to have conversation.  Upper extremity ischemia with dry gangrene: Present on admission.  Worse on the right side.  Also suspected to have worsening from pressures causing vaso constriction.Vascular surgery following.  Currently on low-dose heparin.  No plan for intervention.  Rx for autoamputation.   Hypokalemia/hypernatremia: Continue to monitor electrolytes.  Slightly  elevated creatinine, more than yesterday.  Continue free water boluses for hypernatremia.   Nonobstructing ureteral stone: We recommend follow-up with urology as an outpatient.   Goals of care/deconditioning/debility: Palliative care closely following.  Currently DNR.  Needs to continue discussing goals of care.  PT/OT recommending skilled facility on discharge.  After discussion with family, they are agreeable for comfort care if she declines further.   DVT prophylaxis: Place and maintain sequential compression device Start: 01/29/22 1021   Code Status: DNR  Family Communication:  None present at bedside.  Status is: Inpatient Remains inpatient appropriate because: Patient very sick.  Febrile and is still septic.   Estimated body mass index is 21.04 kg/m as calculated from the following:   Height as of this encounter: 5\' 4"  (1.626 m).   Weight as of this encounter: 55.6 kg.  Pressure Injury 02/08/22 Buttocks Right Stage 2 -  Partial thickness loss of dermis presenting as a shallow open injury with a red, pink wound bed without slough. (Active)  02/08/22 0800  Location: Buttocks  Location Orientation: Right  Staging: Stage 2 -  Partial thickness loss of dermis presenting as a shallow open injury with a red, pink wound bed without slough.  Wound Description (Comments):   Present on Admission:   Dressing Type Foam - Lift dressing to assess site every shift 02/10/22 2000   Nutritional Assessment: Body mass index is 21.04 kg/m.02/12/22 Seen by dietician.  I agree with the assessment and plan as outlined below: Nutrition Status: Nutrition Problem: Severe Malnutrition Etiology: chronic illness (COPD) Signs/Symptoms: severe muscle depletion, severe fat depletion Interventions: Tube feeding  . Skin Assessment: I have examined the patient's skin and I agree with the wound assessment as performed by the wound care RN as outlined below: Pressure Injury 02/08/22 Buttocks Right Stage 2 -   Partial thickness loss of dermis presenting as a shallow open injury with a red, pink wound bed without slough. (Active)  02/08/22 0800  Location: Buttocks  Location Orientation: Right  Staging: Stage 2 -  Partial thickness loss of dermis presenting as a shallow open injury with a red, pink wound bed without slough.  Wound Description (Comments):   Present on Admission:   Dressing Type Foam - Lift dressing to assess site every shift 02/10/22 2000    Consultants:  Cardiology  Procedures:  As above  Antimicrobials:  Anti-infectives (From admission, onward)    Start     Dose/Rate Route Frequency Ordered Stop   02/04/22 1345  meropenem (MERREM) 1 g in sodium chloride 0.9 % 100 mL IVPB        1 g 200 mL/hr over 30 Minutes Intravenous Every 12 hours 02/04/22 1258 02/13/22 2359   02/01/22 2200  ceFAZolin (ANCEF) IVPB 2g/100 mL premix  Status:  Discontinued        2 g 200 mL/hr over 30 Minutes Intravenous Every 12 hours 02/01/22 0754 02/04/22 1258   01/29/22 2200  ceFEPIme (MAXIPIME) 1 g in sodium chloride 0.9 % 100 mL IVPB  Status:  Discontinued        1 g 200 mL/hr over 30 Minutes Intravenous Every 24 hours 01/29/22 0046 01/29/22 0242   01/29/22 2200  cefTRIAXone (ROCEPHIN) 2 g in sodium chloride 0.9 % 100 mL IVPB  Status:  Discontinued        2 g 200 mL/hr over 30 Minutes Intravenous Every 24 hours 01/29/22 0242 02/01/22 0754   01/29/22 0048  vancomycin variable dose per unstable renal function (pharmacist dosing)  Status:  Discontinued         Does not apply See admin instructions 01/29/22 0048 01/29/22 1343   01/28/22 2230  ceFEPIme (MAXIPIME) 2 g in sodium chloride 0.9 % 100 mL IVPB        2 g 200 mL/hr over 30 Minutes Intravenous  Once 01/28/22 2228 01/28/22 2350   01/28/22 2230  metroNIDAZOLE (FLAGYL) IVPB 500 mg        500 mg 100 mL/hr over 60 Minutes Intravenous  Once 01/28/22 2228 01/29/22 0021   01/28/22 2230  vancomycin (VANCOCIN) IVPB 1000 mg/200 mL premix        1,000  mg 200 mL/hr over 60 Minutes Intravenous  Once 01/28/22 2228 01/29/22 0150         Subjective: Patient seen and examined.  She is alert and appears to be oriented and she is trying to answer the questions however she is too weak to have a conversation.  She has no complaints.  Objective: Vitals:   02/11/22 0000 02/11/22 0400 02/11/22 0814 02/11/22 0850  BP: (!) 131/55 (!) 146/72 (!) 153/76   Pulse: (!) 132 (!) 132 (!) 123   Resp: (!) 32 (!) 32 (!) 27   Temp: 98.6 F (37 C) 98.6 F (37 C) (!) 101.1 F (38.4 C)   TempSrc: Axillary Oral Axillary   SpO2: 100% 97% 98% 98%  Weight:  55.6 kg    Height:        Intake/Output Summary (Last 24 hours) at 02/11/2022 0903 Last data filed at 02/11/2022 0600 Gross per 24 hour  Intake 1199.13 ml  Output 1350 ml  Net -150.87 ml   Filed Weights   02/09/22 2249 02/10/22 0400 02/11/22 0400  Weight: 55.4 kg 55.5 kg 55.6 kg    Examination:  General exam: Appears calm and comfortable but very weak. Respiratory system: Rhonchi bilaterally. Respiratory effort normal. Cardiovascular system: S1 & S2 heard, RRR. No JVD, murmurs, rubs, gallops or clicks. No pedal edema. Gastrointestinal system: Abdomen is nondistended, soft and nontender. No organomegaly or masses felt. Normal bowel sounds heard. Central nervous system: Alert and oriented. No focal neurological deficits. Extremities: Symmetric 5 x 5 power.  Data Reviewed: I have personally reviewed following labs and imaging studies  CBC: Recent Labs  Lab 02/06/22 0219 02/07/22 0310 02/08/22 0436 02/09/22 0530 02/10/22 0534 02/11/22 0440  WBC 24.5* 21.3* 20.1* 19.1* 16.5* 14.1*  NEUTROABS 21.6*  --   --   --   --   --   HGB 8.3* 7.8* 7.3* 7.6* 6.8* 8.7*  HCT 24.6* 22.7* 21.4* 22.8* 20.5* 25.5*  MCV 75.0* 74.2* 75.1* 75.0* 74.0* 76.6*  PLT 57* 67* 79* 120* 171 227   Basic Metabolic Panel: Recent Labs  Lab 02/06/22 0219 02/07/22 0310 02/08/22 0436 02/09/22 0530 02/10/22 0534  02/11/22 0440  NA 152* 148* 149* 149* 149* 152*  K 3.9 3.4* 4.5 4.1 3.6 3.6  CL 124* 121* 121* 123* 123* 130*  CO2 22 21* 21* 19* 17* 16*  GLUCOSE 191* 188* 180* 136* 140* 151*  BUN 44* 43* 51* 48* 51* 50*  CREATININE 1.88* 1.79* 1.69* 1.50* 1.47* 1.38*  CALCIUM 7.4* 7.2* 7.8* 8.1* 8.2* 8.2*  MG 1.3*  --   --   --   --   --    GFR: Estimated Creatinine Clearance: 36 mL/min (A) (by C-G formula based on SCr of 1.38 mg/dL (H)). Liver Function Tests: Recent Labs  Lab 02/06/22 0219  AST 135*  ALT 10  ALKPHOS 69  BILITOT 2.5*  PROT 4.4*  ALBUMIN <1.5*   No results for input(s): "LIPASE", "AMYLASE" in the last 168 hours. No results for input(s): "AMMONIA" in the last 168 hours. Coagulation Profile: No results for input(s): "INR", "PROTIME" in the last 168 hours. Cardiac Enzymes: No results for input(s): "CKTOTAL", "CKMB", "CKMBINDEX", "TROPONINI" in the last 168 hours. BNP (last 3 results) No results for input(s): "PROBNP" in the last 8760 hours. HbA1C: No results for input(s): "HGBA1C" in the last 72 hours. CBG: Recent Labs  Lab 02/09/22 1533 02/09/22 1936 02/09/22 2308 02/10/22 0453 02/11/22 0819  GLUCAP 137* 120* 140* 135* 128*   Lipid Profile: No results for input(s): "CHOL", "HDL", "LDLCALC", "TRIG", "CHOLHDL", "LDLDIRECT" in the last 72 hours. Thyroid Function Tests: No results for input(s): "TSH", "T4TOTAL", "FREET4", "T3FREE", "THYROIDAB" in the last 72 hours. Anemia Panel: No results for input(s): "VITAMINB12", "FOLATE", "FERRITIN", "TIBC", "IRON", "RETICCTPCT" in the last 72 hours. Sepsis Labs: No results for input(s): "PROCALCITON", "LATICACIDVEN" in the last 168 hours.  Recent Results (from the past 240 hour(s))  Culture, Respiratory w Gram Stain     Status: None   Collection Time: 02/02/22  8:17 AM   Specimen: Tracheal Aspirate; Respiratory  Result Value Ref Range Status   Specimen Description TRACHEAL ASPIRATE  Final   Special Requests NONE  Final    Gram Stain   Final    ABUNDANT WBC PRESENT, PREDOMINANTLY PMN ABUNDANT GRAM NEGATIVE RODS RARE GRAM POSITIVE COCCI IN PAIRS Performed at [facility], [address] [place], [place] [address]    Culture ABUNDANT ENTEROBACTER CLOACAE  Final   Report Status 02/05/2022 FINAL  Final   Organism ID, Bacteria ENTEROBACTER CLOACAE  Final      Susceptibility   Enterobacter cloacae - MIC*    CEFAZOLIN >=64 RESISTANT Resistant     CEFEPIME >=32 RESISTANT Resistant     CEFTAZIDIME >=64 RESISTANT Resistant     CIPROFLOXACIN <=0.25 SENSITIVE Sensitive     GENTAMICIN <=1 SENSITIVE Sensitive     IMIPENEM <=0.25 SENSITIVE Sensitive     TRIMETH/SULFA <=20 SENSITIVE Sensitive     PIP/TAZO >=128 RESISTANT Resistant     * ABUNDANT ENTEROBACTER CLOACAE  Culture, body fluid w Gram Stain-bottle     Status: None   Collection Time: 02/02/22  1:18 PM   Specimen: Pleura  Result Value Ref Range Status   Specimen Description PLEURAL  Final   Special Requests BOTTLES DRAWN AEROBIC AND ANAEROBIC  Final   Culture   Final    NO GROWTH 5 DAYS Performed at [facility], [address], [place], [place] [address]    Report Status 02/07/2022 FINAL  Final  Gram stain     Status: None   Collection Time: 02/02/22  1:18 PM   Specimen: Pleura  Result Value Ref Range Status   Specimen Description PLEURAL  Final   Special Requests NONE  Final   Gram Stain   Final    WBC PRESENT,BOTH PMN AND MONONUCLEAR NO ORGANISMS SEEN CYTOSPIN SMEAR Performed at [facility], [address], [place], [place] [address]    Report Status 02/02/2022 FINAL  Final  Culture, blood (  Routine X 2) w Reflex to ID Panel     Status: None   Collection Time: 02/05/22 11:38 AM   Specimen: BLOOD  Result Value Ref Range Status   Specimen Description BLOOD PICC LINE  Final   Special Requests   Final    BOTTLES DRAWN AEROBIC AND ANAEROBIC Blood Culture adequate volume   Culture   Final    NO GROWTH 5 DAYS Performed at  [facility], [address] [place], [place] [address]    Report Status 02/10/2022 FINAL  Final  Culture, blood (Routine X 2) w Reflex to ID Panel     Status: None   Collection Time: 02/05/22 11:38 AM   Specimen: BLOOD  Result Value Ref Range Status   Specimen Description BLOOD PICC LINE  Final   Special Requests   Final    BOTTLES DRAWN AEROBIC AND ANAEROBIC Blood Culture adequate volume   Culture   Final    NO GROWTH 5 DAYS Performed at [facility], [address], [place], [place] [address]    Report Status 02/10/2022 FINAL  Final  Culture, Respiratory w Gram Stain     Status: None   Collection Time: 02/06/22 10:56 AM   Specimen: Tracheal Aspirate; Respiratory  Result Value Ref Range Status   Specimen Description TRACHEAL ASPIRATE  Final   Special Requests NONE  Final   Gram Stain   Final    NO WBC SEEN NO ORGANISMS SEEN Performed at [facility], [address] [place], [place] [address]    Culture MODERATE ENTEROBACTER CLOACAE  Final   Report Status 02/08/2022 FINAL  Final   Organism ID, Bacteria ENTEROBACTER CLOACAE  Final      Susceptibility   Enterobacter cloacae - MIC*    CEFAZOLIN >=64 RESISTANT Resistant     CEFEPIME 2 SENSITIVE Sensitive     CEFTAZIDIME >=64 RESISTANT Resistant     CIPROFLOXACIN <=0.25 SENSITIVE Sensitive     GENTAMICIN <=1 SENSITIVE Sensitive     IMIPENEM <=0.25 SENSITIVE Sensitive     TRIMETH/SULFA <=20 SENSITIVE Sensitive     PIP/TAZO >=128 RESISTANT Resistant     * MODERATE ENTEROBACTER CLOACAE     Radiology Studies: No results found.  Scheduled Meds:  arformoterol  15 mcg Nebulization BID   budesonide (PULMICORT) nebulizer solution  0.25 mg Nebulization BID   Chlorhexidine Gluconate Cloth  6 each Topical Q0600   docusate  100 mg Per Tube BID   FLUoxetine  20 mg Per Tube Daily   free water  200 mL Per Tube Q4H   insulin aspart  0-6 Units Subcutaneous Q4H   isosorbide-hydrALAZINE  0.5 tablet Per Tube TID    mouth rinse  15 mL Mouth Rinse 4 times per day   polyethylene glycol  17 g Per Tube Daily   QUEtiapine  50 mg Per Tube BID   sodium chloride flush  10-40 mL Intracatheter Q12H   Continuous Infusions:  sodium chloride Stopped (02/09/22 2311)   sodium chloride Stopped (02/09/22 2311)   feeding supplement (VITAL 1.5 CAL) 1,000 mL (02/11/22 0522)   heparin 1,250 Units/hr (02/11/22 0600)   meropenem (MERREM) IV 1 g (02/10/22 2141)     LOS: 13 days   Hughie Closs, MD [facility]  02/11/2022, 9:03 AM   *Please note that this is a verbal dictation therefore any spelling or grammatical errors are due to the "Dragon Medical One" system interpretation.  Please page via Amion and do not  message via secure chat for urgent patient care matters. Secure chat can be used for non urgent patient care matters.  How to contact the [facility] Attending or Consulting provider 7A - 7P or covering provider during after hours 7P -7A, for this patient?  Check the care team in [facility] and look for a) attending/consulting TRH provider listed and b) the [facility] team listed. Page or secure chat 7A-7P. Log into [email or web address] and use [facility]'s universal password to access. If you do not have the password, please contact the hospital operator. Locate the [facility] provider you are looking for under [facility] and page to a number that you can be directly reached. If you still have difficulty reaching the provider, please page the [facility] (Director on Call) for the Hospitalists listed on amion for assistance.

## 2022-02-11 NOTE — Progress Notes (Signed)
Rounding Note    Patient Name: Nicole Cunningham Date of Encounter: 02/11/2022  [place] Cardiologist: None   Subjective   NAEO. Received transfusion yesterday. No other significant changes.  Inpatient Medications    Scheduled Meds:  arformoterol  15 mcg Nebulization BID   budesonide (PULMICORT) nebulizer solution  0.25 mg Nebulization BID   Chlorhexidine Gluconate Cloth  6 each Topical Q0600   docusate  100 mg Per Tube BID   FLUoxetine  20 mg Per Tube Daily   free water  200 mL Per Tube Q4H   insulin aspart  0-6 Units Subcutaneous Q4H   isosorbide-hydrALAZINE  0.5 tablet Per Tube TID   mouth rinse  15 mL Mouth Rinse 4 times per day   polyethylene glycol  17 g Per Tube Daily   QUEtiapine  50 mg Per Tube BID   sodium chloride flush  10-40 mL Intracatheter Q12H   Continuous Infusions:  sodium chloride Stopped (02/09/22 2311)   sodium chloride Stopped (02/09/22 2311)   feeding supplement (VITAL 1.5 CAL) 1,000 mL (02/11/22 0522)   heparin 1,250 Units/hr (02/11/22 0600)   meropenem (MERREM) IV 1 g (02/10/22 2141)   PRN Meds: sodium chloride, acetaminophen, ipratropium-albuterol, lip balm, mouth rinse, oxyCODONE, phenol, sodium chloride flush   Vital Signs    Vitals:   02/10/22 2000 02/11/22 0000 02/11/22 0400 02/11/22 0814  BP: (!) 145/74 (!) 131/55 (!) 146/72 (!) 153/76  Pulse: (!) 122 (!) 132 (!) 132 (!) 123  Resp: (!) 24 (!) 32 (!) 32 (!) 27  Temp: 98.1 F (36.7 C) 98.6 F (37 C) 98.6 F (37 C) (!) 101.1 F (38.4 C)  TempSrc: Axillary Axillary Oral Axillary  SpO2: 99% 100% 97% 98%  Weight:   55.6 kg   Height:        Intake/Output Summary (Last 24 hours) at 02/11/2022 0848 Last data filed at 02/11/2022 0600 Gross per 24 hour  Intake 1199.13 ml  Output 1350 ml  Net -150.87 ml       02/11/2022    4:00 AM 02/10/2022    4:00 AM 02/09/2022   10:49 PM  Last 3 Weights  Weight (lbs) 122 lb 9.2 oz 122 lb 5.7 oz 122 lb 2.2 oz  Weight (kg)  55.6 kg 55.5 kg 55.4 kg      Telemetry    Sinus tach - Personally Reviewed  ECG    Personally Reviewed  Physical Exam   GEN: No acute distress.  Chronically ill appearing. Does not interact. Neck: No JVD Cardiac: tachycardic, regular rhythm, no murmurs, rubs, or gallops.  Respiratory: Clear to auscultation bilaterally. GI: Soft, nontender, non-distended  MS: No edema; fingers are ischemic. L>R. Neuro:  Nonfocal  Psych: Normal affect   Labs    High Sensitivity Troponin:   Recent Labs  Lab 01/28/22 2234 01/29/22 0028  TROPONINIHS 64* 80*      Chemistry Recent Labs  Lab 02/06/22 0219 02/07/22 0310 02/09/22 0530 02/10/22 0534 02/11/22 0440  NA 152*   < > 149* 149* 152*  K 3.9   < > 4.1 3.6 3.6  CL 124*   < > 123* 123* 130*  CO2 22   < > 19* 17* 16*  GLUCOSE 191*   < > 136* 140* 151*  BUN 44*   < > 48* 51* 50*  CREATININE 1.88*   < > 1.50* 1.47* 1.38*  CALCIUM 7.4*   < > 8.1* 8.2* 8.2*  MG 1.3*  --   --   --   --  PROT 4.4*  --   --   --   --   ALBUMIN <1.5*  --   --   --   --   AST 135*  --   --   --   --   ALT 10  --   --   --   --   ALKPHOS 69  --   --   --   --   BILITOT 2.5*  --   --   --   --   GFRNONAA 30*   < > 39* 40* 43*  ANIONGAP 6   < > 7 9 6    < > = values in this interval not displayed.     Lipids No results for input(s): "CHOL", "TRIG", "HDL", "LABVLDL", "LDLCALC", "CHOLHDL" in the last 168 hours.  Hematology Recent Labs  Lab 02/09/22 0530 02/10/22 0534 02/11/22 0440  WBC 19.1* 16.5* 14.1*  RBC 3.04* 2.77* 3.33*  HGB 7.6* 6.8* 8.7*  HCT 22.8* 20.5* 25.5*  MCV 75.0* 74.0* 76.6*  MCH 25.0* 24.5* 26.1  MCHC 33.3 33.2 34.1  RDW 30.7* Not Measured 27.1*  PLT 120* 171 227    Thyroid No results for input(s): "TSH", "FREET4" in the last 168 hours.  BNPNo results for input(s): "BNP", "PROBNP" in the last 168 hours.  DDimer No results for input(s): "DDIMER" in the last 168 hours.   Radiology    No results found.    Assessment &  Plan   #Shock Improving.  #Acute systolic hF EF20% Will eventually need LHC/RHC but not now given abnormal labs, mental status No additional diuresis.  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

## 2022-02-11 NOTE — Plan of Care (Signed)
Patient remains in PCU. Patient remains on 2 L/min supplemental O2. TF's via cortrak. PICC in place.   Problem: Education: Goal: Knowledge of General Education information will improve Description: Including pain rating scale, medication(s)/side effects and non-pharmacologic comfort measures Outcome: Not Progressing   Problem: Health Behavior/Discharge Planning: Goal: Ability to manage health-related needs will improve Outcome: Not Progressing   Problem: Clinical Measurements: Goal: Ability to maintain clinical measurements within normal limits will improve Outcome: Not Progressing Goal: Will remain free from infection Outcome: Not Progressing Goal: Diagnostic test results will improve Outcome: Not Progressing Goal: Respiratory complications will improve Outcome: Not Progressing Goal: Cardiovascular complication will be avoided Outcome: Not Progressing   Problem: Activity: Goal: Risk for activity intolerance will decrease Outcome: Not Progressing   Problem: Nutrition: Goal: Adequate nutrition will be maintained Outcome: Not Progressing   Problem: Coping: Goal: Level of anxiety will decrease Outcome: Not Progressing   Problem: Elimination: Goal: Will not experience complications related to bowel motility Outcome: Not Progressing Goal: Will not experience complications related to urinary retention Outcome: Not Progressing   Problem: Pain Managment: Goal: General experience of comfort will improve Outcome: Not Progressing   Problem: Safety: Goal: Ability to remain free from injury will improve Outcome: Not Progressing   Problem: Skin Integrity: Goal: Risk for impaired skin integrity will decrease Outcome: Not Progressing   Problem: Activity: Goal: Ability to tolerate increased activity will improve Outcome: Not Progressing   Problem: Respiratory: Goal: Ability to maintain a clear airway and adequate ventilation will improve Outcome: Not Progressing    Problem: Role Relationship: Goal: Method of communication will improve Outcome: Not Progressing   Problem: Activity: Goal: Ability to tolerate increased activity will improve Outcome: Not Progressing   Problem: Respiratory: Goal: Ability to maintain a clear airway and adequate ventilation will improve Outcome: Not Progressing   Problem: Role Relationship: Goal: Method of communication will improve Outcome: Not Progressing   Problem: Fluid Volume: Goal: Hemodynamic stability will improve Outcome: Not Progressing   Problem: Clinical Measurements: Goal: Diagnostic test results will improve Outcome: Not Progressing Goal: Signs and symptoms of infection will decrease Outcome: Not Progressing   Problem: Respiratory: Goal: Ability to maintain adequate ventilation will improve Outcome: Not Progressing   Problem: Education: Goal: Ability to describe self-care measures that may prevent or decrease complications (Diabetes Survival Skills Education) will improve Outcome: Not Progressing Goal: Individualized Educational Video(s) Outcome: Not Progressing   Problem: Coping: Goal: Ability to adjust to condition or change in health will improve Outcome: Not Progressing   Problem: Fluid Volume: Goal: Ability to maintain a balanced intake and output will improve Outcome: Not Progressing   Problem: Health Behavior/Discharge Planning: Goal: Ability to identify and utilize available resources and services will improve Outcome: Not Progressing Goal: Ability to manage health-related needs will improve Outcome: Not Progressing   Problem: Metabolic: Goal: Ability to maintain appropriate glucose levels will improve Outcome: Not Progressing   Problem: Nutritional: Goal: Maintenance of adequate nutrition will improve Outcome: Not Progressing Goal: Progress toward achieving an optimal weight will improve Outcome: Not Progressing   Problem: Skin Integrity: Goal: Risk for impaired skin  integrity will decrease Outcome: Not Progressing   Problem: Tissue Perfusion: Goal: Adequacy of tissue perfusion will improve Outcome: Not Progressing

## 2022-02-11 NOTE — Progress Notes (Signed)
Daily Progress Note   Patient Name: Nicole Cunningham       Date: 02/11/2022 DOB: Jul 28, 1960  Age: 61 y.o. MRN#: [id number] Attending Physician: Hughie Closs, MD Primary Care Physician: [name], MD Admit Date: 01/28/2022  Reason for Consultation/Follow-up: Establishing goals of care  Patient Profile/HPI:  61 y.o. female  with past medical history of COPD, anemia, HTN presents to [facility] ED on 11/6 with AMS. The patient recently admitted to [facility] with AMS on 11/2021, found to have UTI and pyelonephritis during that admission.  On 11/5, patient having increased confusion.  Having some SOB over the past couple of days.  Brought to [facility] ED for further eval. Upon arrival to [facility] ED, patient confused but able to state name.  Admitted with shock, AKI, lactic acidosis of 9.0  She was found to have E. coli bacteremia and EF 20 to 25% on echo, mixed cardiogenic and septic shock. 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 Vascular surgery recommending heparin.  PMT was consulted for GOC conversations.   11/17- now extubated, awake, alert, smiling, nods yes to all questions, pressors off  Subjective: Chart reviewed including labs, progress notes, imaging from this and previous encounters.  On eval patient awake, alert. Smiles with interaction. More interactive than previous visit on 11/17. Answering questions appropriately. No family at bedside. Denies pain.   Review of Systems  Unable to perform ROS: Mental status change     Physical Exam Vitals and nursing note reviewed.  Constitutional:      Appearance: She is ill-appearing.     Comments: frail  Cardiovascular:     Rate and Rhythm: Normal rate.  Skin:    Comments: R and left digits with dry gangrene, redness extending up to elbows   Neurological:     Mental Status: She is alert.             Vital Signs: BP 137/62 (BP Location: Right Leg)   Pulse (!) 134   Temp 99.8 F (37.7 C) (Axillary)   Resp (!) 33   Ht 5\' 4"  (1.626 m)   Wt 55.6 kg   SpO2 96%   BMI 21.04 kg/m  SpO2: SpO2: 96 % O2 Device: O2 Device: Nasal Cannula O2 Flow Rate: O2 Flow Rate (L/min): 2 L/min  Intake/output summary:  Intake/Output Summary (Last  24 hours) at 02/11/2022 1303 Last data filed at 02/11/2022 0600 Gross per 24 hour  Intake 1199.13 ml  Output 1350 ml  Net -150.87 ml    LBM: Last BM Date : 02/10/22 Baseline Weight: Weight: 40.8 kg Most recent weight: Weight: 55.6 kg       Palliative Assessment/Data: PPS: 20%      Patient Active Problem List   Diagnosis Date Noted   Pressure injury of skin 02/09/2022   Septic shock (HCC) 01/29/2022   Acute encephalopathy 01/29/2022   Protein-calorie malnutrition, severe 12/09/2021   Anxiety and depression 12/07/2021   Thrombocytopenia (HCC) 12/07/2021   Unintentional weight loss 12/06/2021   Delirium 12/06/2021   Symptomatic anemia 04/08/2021   Syncope 04/08/2021   MDD (major depressive disorder), recurrent, 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   Essential hypertension 04/25/2020   History of COVID-19 04/25/2020   History of sepsis 04/25/2020   Anemia 04/25/2020   Hypomagnesemia 04/25/2020   COVID-19 virus infection 04/20/2020   AKI (acute kidney injury) (HCC) 04/18/2020   Sepsis (HCC) 04/18/2020   Hypertensive urgency 12/15/2019   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   MDD (major depressive disorder), recurrent, in full remission (HCC) 12/14/2019   Anxiety disorder 12/14/2019    Palliative Care Assessment & Plan    Assessment/Recommendations/Plan  Continue current plan, note if patient decompensates then plan is for transition to comfort Pain is well controlled.  Continue current interventions. PMT will continue to follow for progress versus  decompensation   Code Status: DNR  Prognosis:  Unable to determine  Discharge Planning: To Be Determined  Care plan was discussed with patient's nurse.  Thank you for allowing the Palliative Medicine Team to assist in the care of this patient.  Greater than 50%  of this time was spent counseling and coordinating care related to the above assessment and plan.  Ocie Bob, AGNP-C Palliative Medicine   Please contact Palliative Medicine Team phone at [phone] for questions and concerns.

## 2022-02-11 NOTE — Progress Notes (Signed)
ANTICOAGULATION CONSULT NOTE - Follow Up Consult  Pharmacy Consult for Heparin Indication: ischemic digits  No Active Allergies  Patient Measurements: Height: 5\' 4"  (162.6 cm) Weight: 55.6 kg (122 lb 9.2 oz) IBW/kg (Calculated) : 54.7 Heparin Dosing Weight: 48 kg  Vital Signs: Temp: 98.6 F (37 C) (11/19 0400) Temp Source: Oral (11/19 0400) BP: 146/72 (11/19 0400) Pulse Rate: 132 (11/19 0400)  Labs: Recent Labs    02/09/22 0530 02/09/22 1443 02/10/22 0534 02/10/22 1228 02/11/22 0440  HGB 7.6*  --  6.8*  --  8.7*  HCT 22.8*  --  20.5*  --  25.5*  PLT 120*  --  171  --  227  HEPARINUNFRC 0.37 0.39  --  0.30 0.33  CREATININE 1.50*  --  1.47*  --  1.38*     Estimated Creatinine Clearance: 36 mL/min (A) (by C-G formula based on SCr of 1.38 mg/dL (H)).   Medical History: Past Medical History:  Diagnosis Date   Bronchitis    COPD (chronic obstructive pulmonary disease) (HCC)    Hypertension     Assessment: 61 yo female presented on 11/6 with AMS found to have e coli bacteremia and mixed cardiogenic/septic shock. Course complicated by ischemic fingers and vascular consulted but unable to use IV heparin due to thrombocytopenia. Noted dark tarry stools and noted somewhat bloody smears on 11/13 per RN. Pharmacy consulted to dose heparin with low goal and no bolus now that plts >50.  Heparin level is therapeutic at 0.33, Hgb improved s/p pRBCs.  Goal of Therapy:  Heparin level 0.3-0.5 units/ml Monitor platelets by anticoagulation protocol: Yes   Plan:  Continue heparin 1250 units/hr Monitor daily heparin level and CBC  12/13, PharmD, [place], [facility] Clinical Pharmacist [phone] Please check AMION for all [facility] Pharmacy numbers 02/11/2022

## 2022-02-12 ENCOUNTER — Inpatient Hospital Stay (HOSPITAL_COMMUNITY): Payer: Self-pay

## 2022-02-12 DIAGNOSIS — R627 Adult failure to thrive: Secondary | ICD-10-CM

## 2022-02-12 DIAGNOSIS — I5021 Acute systolic (congestive) heart failure: Secondary | ICD-10-CM

## 2022-02-12 LAB — ECHOCARDIOGRAM COMPLETE
AR max vel: 2.46 cm2
AV Area VTI: 2.38 cm2
AV Area mean vel: 2.28 cm2
AV Mean grad: 11 mmHg
AV Peak grad: 17.1 mmHg
Ao pk vel: 2.07 m/s
Height: 64 in
S' Lateral: 3.3 cm
Weight: 1932.99 oz

## 2022-02-12 LAB — BASIC METABOLIC PANEL
Anion gap: 12 (ref 5–15)
BUN: 51 mg/dL — ABNORMAL HIGH (ref 8–23)
CO2: 16 mmol/L — ABNORMAL LOW (ref 22–32)
Calcium: 8.4 mg/dL — ABNORMAL LOW (ref 8.9–10.3)
Chloride: 126 mmol/L — ABNORMAL HIGH (ref 98–111)
Creatinine, Ser: 1.31 mg/dL — ABNORMAL HIGH (ref 0.44–1.00)
GFR, Estimated: 46 mL/min — ABNORMAL LOW (ref >60)
Glucose, Bld: 151 mg/dL — ABNORMAL HIGH (ref 70–99)
Potassium: 3.7 mmol/L (ref 3.5–5.1)
Sodium: 154 mmol/L — ABNORMAL HIGH (ref 135–145)

## 2022-02-12 LAB — CBC
HCT: 23.9 % — ABNORMAL LOW (ref 36.0–46.0)
Hemoglobin: 8.1 g/dL — ABNORMAL LOW (ref 12.0–15.0)
MCH: 26.6 pg (ref 26.0–34.0)
MCHC: 33.9 g/dL (ref 30.0–36.0)
MCV: 78.4 fL — ABNORMAL LOW (ref 80.0–100.0)
Platelets: 243 10*3/uL (ref 150–400)
RBC: 3.05 MIL/uL — ABNORMAL LOW (ref 3.87–5.11)
RDW: 27.6 % — ABNORMAL HIGH (ref 11.5–15.5)
WBC: 14.2 10*3/uL — ABNORMAL HIGH (ref 4.0–10.5)
nRBC: 0 % (ref 0.0–0.2)

## 2022-02-12 LAB — GLUCOSE, CAPILLARY
Glucose-Capillary: 108 mg/dL — ABNORMAL HIGH (ref 70–99)
Glucose-Capillary: 117 mg/dL — ABNORMAL HIGH (ref 70–99)
Glucose-Capillary: 126 mg/dL — ABNORMAL HIGH (ref 70–99)
Glucose-Capillary: 126 mg/dL — ABNORMAL HIGH (ref 70–99)
Glucose-Capillary: 131 mg/dL — ABNORMAL HIGH (ref 70–99)
Glucose-Capillary: 138 mg/dL — ABNORMAL HIGH (ref 70–99)
Glucose-Capillary: 141 mg/dL — ABNORMAL HIGH (ref 70–99)

## 2022-02-12 LAB — HEPARIN LEVEL (UNFRACTIONATED): Heparin Unfractionated: 0.46 IU/mL (ref 0.30–0.70)

## 2022-02-12 MED ORDER — CARVEDILOL 3.125 MG PO TABS
3.1250 mg | ORAL_TABLET | Freq: Two times a day (BID) | ORAL | Status: DC
  Administered 2022-02-12 – 2022-02-23 (×21): 3.125 mg via ORAL
  Filled 2022-02-12 (×21): qty 1

## 2022-02-12 MED ORDER — ACETAMINOPHEN 500 MG PO TABS
1000.0000 mg | ORAL_TABLET | Freq: Three times a day (TID) | ORAL | Status: DC
  Administered 2022-02-12 – 2022-02-19 (×21): 1000 mg
  Filled 2022-02-12 (×22): qty 2

## 2022-02-12 MED ORDER — FREE WATER
300.0000 mL | Status: DC
  Administered 2022-02-12 – 2022-02-14 (×16): 300 mL

## 2022-02-12 NOTE — Progress Notes (Signed)
  Echocardiogram 2D Echocardiogram has been performed.  Gerda Diss 02/12/2022, 10:02 AM

## 2022-02-12 NOTE — Progress Notes (Signed)
[facility]  Subjective  - really unable to use her hands bilaterally.  Extubated.  Weak.   Objective 130/72 (!) 117 98.6 F (37 C) (Oral) (!) 39 100%  Intake/Output Summary (Last 24 hours) at 02/12/2022 1556 Last data filed at 02/12/2022 1549 Gross per 24 hour  Intake 8608.13 ml  Output 1400 ml  Net 7208.13 ml    Dry gangrene with ischemic changes to all 5 digits in both hands Both hands appear to have contractures with limited function  Laboratory Lab Results: Recent Labs    02/11/22 0440 02/12/22 0420  WBC 14.1* 14.2*  HGB 8.7* 8.1*  HCT 25.5* 23.9*  PLT 227 243   BMET Recent Labs    02/11/22 0440 02/12/22 0420  NA 152* 154*  K 3.6 3.7  CL 130* 126*  CO2 16* 16*  GLUCOSE 151* 151*  BUN 50* 51*  CREATININE 1.38* 1.31*  CALCIUM 8.2* 8.4*    COAG Lab Results  Component Value Date   INR 2.2 (H) 01/29/2022   INR 1.8 (H) 01/28/2022   INR 1.1 04/18/2020   No results found for: "PTT"  Assessment/Planning:  61 year old female admitted with cardiogenic and septic shock that vascular surgery was asked to evaluate in the ICU last weekend for ischemic changes to both hands.  At the time she had bilateral upper extremity arterial duplexes showing no evidence of thrombus.  This was felt to be low flow state and pressor induced changes with ischemic changes to both hands.  She has since been started on heparin since her platelet and hemoglobin count have recovered.  This was not an option at the time of initial evaluation.  I can only get Doppler flow into the mid forearm bilaterally in the radial artery.  I think she remains very high risk for bilateral upper extremity hand amputations given the severity of her illness.  Will allow her hands to demarcate.  Cephus Shelling 02/12/2022 3:56 PM --

## 2022-02-12 NOTE — Progress Notes (Signed)
ANTICOAGULATION CONSULT NOTE - Follow Up Consult  Pharmacy Consult for Heparin Indication: ischemic digits  No Active Allergies  Patient Measurements: Height: 5\' 4"  (162.6 cm) Weight: 54.8 kg (120 lb 13 oz) IBW/kg (Calculated) : 54.7 Heparin Dosing Weight: 48 kg  Vital Signs: Temp: 99.9 F (37.7 C) (11/20 0300) Temp Source: Axillary (11/20 0300) BP: 143/65 (11/20 0600) Pulse Rate: 106 (11/20 0600)  Labs: Recent Labs    02/10/22 0534 02/10/22 1228 02/11/22 0440 02/12/22 0420  HGB 6.8*  --  8.7* 8.1*  HCT 20.5*  --  25.5* 23.9*  PLT 171  --  227 243  HEPARINUNFRC  --  0.30 0.33 0.46  CREATININE 1.47*  --  1.38* 1.31*     Estimated Creatinine Clearance: 38 mL/min (A) (by C-G formula based on SCr of 1.31 mg/dL (H)).   Medical History: Past Medical History:  Diagnosis Date   Bronchitis    COPD (chronic obstructive pulmonary disease) (HCC)    Hypertension     Assessment: 61 yo female presented on 11/6 with AMS found to have e coli bacteremia and mixed cardiogenic/septic shock. Course complicated by ischemic fingers and vascular consulted but unable to use IV heparin due to thrombocytopenia. Noted dark tarry stools and noted somewhat bloody smears on 11/13 per RN. Pharmacy consulted to dose heparin with low goal and no bolus now that plts >50.  Heparin level remains therapeutic at 0.46, on heparin infusion at 1250 units/hr. Hgb 8.1, plt 243. No s/sx of bleeding or infusion issues.   Goal of Therapy:  Heparin level 0.3-0.5 units/ml Monitor platelets by anticoagulation protocol: Yes   Plan:  Continue heparin 1250 units/hr Monitor daily heparin level and CBC  12/13, PharmD, BCCCP Clinical Pharmacist  Phone: [phone] 02/12/2022 7:20 AM  Please check AMION for all [facility] Pharmacy phone numbers After 10:00 PM, call Main Pharmacy [phone]

## 2022-02-12 NOTE — Progress Notes (Signed)
Speech Language Pathology Treatment: Dysphagia  Patient Details Name: Nicole Cunningham MRN: [id number] DOB: 06-19-1960 Today's Date: 02/12/2022 Time: 6387-5643 SLP Time Calculation (min) (ACUTE ONLY): 11 min  Assessment / Plan / Recommendation Clinical Impression  F/u for readiness for MBS. Oral care provided. Voice weak/low volume and cued cough is weaker today. RR upper 30s.  Provided ice chips, small sips water with pt requiring verbal cues for effortful swallow. Recommend proceeding with MBS given overall deconditioning. She is on schedule for tomorrow.  Continue allowing ice chips/small sips water - pt derives a lot of pleasure from even small amounts. SLP will follow.   HPI HPI: Patient is a 61 year old female who presented to [facility] ED on 11/6 with AMS, UTI and pyelonephritis, cardiogenic and septic shock, AKI, acute hypoxic respiratory failure due to multifocal pna caused by ESBL enterobacter, L parapneumonic effusion, bilateral finger dry gangrene.  ETT 11/6-11/16. PMH of COPD, anemia, HTN. Failed Yale swallow screen after extubation.      SLP Plan  Continue with current plan of care      Recommendations for follow up therapy are one component of a multi-disciplinary discharge planning process, led by the attending physician.  Recommendations may be updated based on patient status, additional functional criteria and insurance authorization.    Recommendations  Diet recommendations: NPO Medication Administration: Via alternative means                Oral Care Recommendations: Oral care QID SLP Visit Diagnosis: Dysphagia, oropharyngeal phase (R13.12) Plan: Continue with current plan of care         Nicole Cunningham L. Nicole Frederic, MA CCC/SLP Clinical Specialist - Acute Care SLP Acute Rehabilitation Services Office number [phone]   Nicole Cunningham Nicole Cunningham  02/12/2022, 9:45 AM

## 2022-02-12 NOTE — Progress Notes (Signed)
Patient ID: Nicole Cunningham, female   DOB: 04-30-1960, 61 y.o.   MRN: [id number]   Advanced Heart Failure Rounding Note  PCP-Cardiologist: None   Subjective:    11/16 Extubated. DBA stopped.   Bronchoscopy performed 11/14 for worsening PNA>>Copious amount of tenacious secretions noted in the RM/RL/LLL, suctioned and BAL was performed. Cultures + for Enterobacter Cloacae.  She remains on meropenem. WBCs 14.2 today.   On heparin gtt. Platelets now normal. She had 1 unit PRBCs on 11/18.   Na 154, on free water 200 q4 boluses.  She is NPO except for sips and is getting tube feeds.  SCr 1.7>1.5>1.31   CVP 4.   She is alert/oriented, able to answer questions.     Objective:   Weight Range: 54.8 kg Body mass index is 20.74 kg/m.   Vital Signs:   Temp:  [97.9 F (36.6 C)-100.7 F (38.2 C)] 99.2 F (37.3 C) (11/20 0805) Pulse Rate:  [81-134] 109 (11/20 0805) Resp:  [29-48] 46 (11/20 0805) BP: (109-148)/(44-76) 148/71 (11/20 0805) SpO2:  [95 %-100 %] 96 % (11/20 0805) Weight:  [54.8 kg] 54.8 kg (11/20 0300) Last BM Date : 02/11/22  Weight change: Filed Weights   02/10/22 0400 02/11/22 0400 02/12/22 0300  Weight: 55.5 kg 55.6 kg 54.8 kg    Intake/Output:   Intake/Output Summary (Last 24 hours) at 02/12/2022 0843 Last data filed at 02/12/2022 0600 Gross per 24 hour  Intake 8608.13 ml  Output 1000 ml  Net 7608.13 ml      Physical Exam  CVP 4  General: NAD Neck: No JVD, no thyromegaly or thyroid nodule.  Lungs: Clear to auscultation bilaterally with normal respiratory effort. CV: Nondisplaced PMI.  Heart regular S1/S2, no S3/S4, no murmur.  1+ ankle edema.  Abdomen: Soft, nontender, no hepatosplenomegaly, no distention.  Skin: Intact without lesions or rashes.  Neurologic: Alert and oriented x 3.  Psych: Normal affect. Extremities: Ischemic digits HEENT: Cortrak    Telemetry   NSR 90s (personally reviewed)  Labs    CBC Recent Labs    02/11/22 0440  02/12/22 0420  WBC 14.1* 14.2*  HGB 8.7* 8.1*  HCT 25.5* 23.9*  MCV 76.6* 78.4*  PLT 227 283   Basic Metabolic Panel Recent Labs    02/11/22 0440 02/12/22 0420  NA 152* 154*  K 3.6 3.7  CL 130* 126*  CO2 16* 16*  GLUCOSE 151* 151*  BUN 50* 51*  CREATININE 1.38* 1.31*  CALCIUM 8.2* 8.4*   Hemoglobin A1C No results for input(s): "HGBA1C" in the last 72 hours. Imaging    No results found.   Medications:     Scheduled Medications:  arformoterol  15 mcg Nebulization BID   budesonide (PULMICORT) nebulizer solution  0.25 mg Nebulization BID   Chlorhexidine Gluconate Cloth  6 each Topical Q0600   docusate  100 mg Per Tube BID   FLUoxetine  20 mg Per Tube Daily   free water  300 mL Per Tube Q3H   insulin aspart  0-6 Units Subcutaneous Q4H   isosorbide-hydrALAZINE  0.5 tablet Per Tube TID   mouth rinse  15 mL Mouth Rinse 4 times per day   polyethylene glycol  17 g Per Tube Daily   QUEtiapine  50 mg Per Tube BID   sodium chloride flush  10-40 mL Intracatheter Q12H    Infusions:  sodium chloride 10 mL/hr at 02/12/22 0600   sodium chloride Stopped (02/09/22 2311)   feeding supplement (VITAL 1.5 CAL) 50  mL/hr at 02/12/22 0600   heparin 1,250 Units/hr (02/12/22 0600)   meropenem (MERREM) IV Stopped (02/11/22 2201)    PRN Medications: sodium chloride, acetaminophen, ipratropium-albuterol, lip balm, mouth rinse, oxyCODONE, phenol, sodium chloride flush    Patient Profile   61 y/o AAF w/ h/o HTN, COPD, anemia, h/o poor med compliance and prior admission to [facility] 9/23 for UTI and pyelonephritis, admitted w/ mixed septic and cardiogenic shock.   Assessment/Plan   1. Shock: Now resolved.  Suspect mixed septic shock and cardiogenic shock.  Lactate was > 9 with PCT > 150.  E coli bacteremia with echo showing EF 20-25%.  Meropenem for broad spectrum coverage.   2. Acute systolic CHF -> cardiogenic shock: Echo showed EF 20-25% with preservation of apical function but severe  hypokinesis of other walls, normal RV, dilated IVC. Unusual wall motion abnormality pattern, prior echo was normal. ?Septic cardiomyopathy with reverse Takotsubo-type pattern.  HS-TnI was minimally elevated with no trend so doubt ACS.  She is now off dobutamine. Creatinine trending down, 1.31 today. CVP 4.  - Continue Bidil 1/2 tab tid.    - Add Coreg 3.125 mg bid.  - I will get repeat echo -  If creatinine continues to trend down and EF still low on repeat echo, would plan RHC/LHC later this week (groin access with ischemic hands) to evaluate cardiomyopathy (possible septic cardiomyopathy but need to rule out severe CAD with smoking history).  3. AKI: In setting of septic shock.  Creatinine trending down slowly, 1.31 today with BUN 51.  4. ID: E coli bacteremia.  PCT > 150 initially. WBCs down to 14.  Also found to have Enterobacter cloacae in trach aspirate.  Now afebrile.  - Continue meropenem.  5. Anemia: Hgb 8.1 today. Had transfuse 11/18.  - transfuse hgb < 7 6. Thrombocytopenia: Plts now normal. Suspect fall was due to critical illness/sepsis/inflammation. 7. Acute hypoxemic respiratory failure: PNA on CXR, also suspect pulmonary edema. Now on 2L [place]. She has a pre-existing history of COPD.   8. Ischemic digits bilateral hands as well as feet: Palpable brachial pulses, no palpable ulnar or radial pulses.  Ischemic digits with no flow on duplex US at wrists.  Suspect pressor-induced. High risk for auto-amputation.  - Continue heparin gtt  9. Neuro: Seems alert/oriented today, answers questions.  10. Hypernatremia: Na 154 today.  Still only getting sips/chips and tube feeds.  - continue free water boluses, increase to 300 cc q3 hrs.  - Needs repeat swallow study.   Length of Stay: [place], MD  02/12/2022, 8:43 AM  Advanced Heart Failure Team Pager [phone] (M-F; 7a - 5p)  Please contact [facility] for night-coverage after hours (5p -7a ) and weekends on [email or web address]

## 2022-02-12 NOTE — Evaluation (Signed)
Physical Therapy Evaluation Patient Details Name: Nicole Cunningham MRN: [id number] DOB: November 05, 1960 Today's Date: 02/12/2022  History of Present Illness  61 y.o. female presents to [facility] hospital on 01/29/2022 with AMS and SOB. Pt admitted with shock, AKI. CT abdomen pelvis 17 mm nonobstructive renal calculus without hydronephrosis. Pt intubated on 11/6. Pt developed acute systolic HF. Bronchoscopy 11/14 for worsening PNA. extubated 11/16. PMH includes COPD and HTN.  Clinical Impression  Session focused on bed mobility, sitting balance and therapeutic exercises for ROM/strengthening. Pt requiring two person total assist for supine <> sit. Tolerated sitting EOB ~10-15 minutes. Demonstrates generalized weakness, decreased skin integrity, poor sitting balance, decreased core/trunk control. Will benefit from post acute rehab to address.     Recommendations for follow up therapy are one component of a multi-disciplinary discharge planning process, led by the attending physician.  Recommendations may be updated based on patient status, additional functional criteria and insurance authorization.  Follow Up Recommendations Skilled nursing-short term rehab (<3 hours/day) Can patient physically be transported by private vehicle: No    Assistance Recommended at Discharge Frequent or constant Supervision/Assistance  Patient can return home with the following  Two people to help with walking and/or transfers;Two people to help with bathing/dressing/bathroom;Assistance with cooking/housework;Assistance with feeding;Direct supervision/assist for medications management;Direct supervision/assist for financial management;Assist for transportation;Help with stairs or ramp for entrance    Equipment Recommendations Hospital bed;Other (comment);Wheelchair (measurements PT);Wheelchair cushion (measurements PT) (hoyer lift)  Recommendations for Other Services       Functional Status Assessment       Precautions /  Restrictions Precautions Precautions: Fall;Other (comment) Precaution Comments: necrotic digits in all extremities Restrictions Weight Bearing Restrictions: No      Mobility  Bed Mobility Overal bed mobility: Needs Assistance Bed Mobility: Supine to Sit, Sit to Supine     Supine to sit: Total assist, +2 for physical assistance Sit to supine: Total assist, +2 for physical assistance   General bed mobility comments: TotalA + 2 for supine <> sit, BUE's elevated on blankets during transition    Transfers                        Ambulation/Gait                  Stairs            Wheelchair Mobility    Modified Rankin (Stroke Patients Only)       Balance Overall balance assessment: Needs assistance Sitting-balance support: Feet supported Sitting balance-[name]: Poor Sitting balance - Comments: Pt requiring min-modA, posterior lean with kyphotic posture and decreased core activation                                     Pertinent Vitals/Pain Pain Assessment Pain Assessment: Faces Faces Pain Scale: Hurts little more Pain Location: BUE's Pain Descriptors / Indicators: Aching Pain Intervention(s): Monitored during session    Home Living                          Prior Function                       Hand Dominance        Extremity/Trunk Assessment                Communication  Cognition Arousal/Alertness: Awake/alert Behavior During Therapy: Flat affect Overall Cognitive Status: Difficult to assess                                 General Comments: pt appears to have slowed processing, difficult to understand attempts at speaking due to low volume of speech        General Comments      Exercises General Exercises - Lower Extremity Long Arc Quad: Both, 10 reps, Seated (2 sets of 5 reps) Heel Slides: Both, AAROM, 10 reps, Supine Hip ABduction/ADduction: AAROM, Both, 10  reps, Supine Other Exercises Other Exercises: Sitting: cervical rotation to R/L, cervical flexion/extension. Attempted scapular retractions, but pt with difficulty activating musculature. Shoulder shrugs x 5   Assessment/Plan    PT Assessment    PT Problem List         PT Treatment Interventions      PT Goals (Current goals can be found in the Care Plan section)  Acute Rehab PT Goals Patient Stated Goal: to improve strength and reduce pain PT Goal Formulation: With patient Time For Goal Achievement: 02/23/22 Potential to Achieve Goals: Fair    Frequency Min 2X/week     Co-evaluation               AM-PAC PT "6 Clicks" Mobility  Outcome Measure Help needed turning from your back to your side while in a flat bed without using bedrails?: Total Help needed moving from lying on your back to sitting on the side of a flat bed without using bedrails?: Total Help needed moving to and from a bed to a chair (including a wheelchair)?: Total Help needed standing up from a chair using your arms (e.g., wheelchair or bedside chair)?: Total Help needed to walk in hospital room?: Total Help needed climbing 3-5 steps with a railing? : Total 6 Click Score: 6    End of Session   Activity Tolerance: Patient tolerated treatment well Patient left: in bed;with call bell/phone within reach Nurse Communication: Mobility status PT Visit Diagnosis: Other abnormalities of gait and mobility (R26.89);Pain;Muscle weakness (generalized) (M62.81) Pain - part of body: Ankle and joints of foot;Leg;Hand;Arm    Time: 7616-0737 PT Time Calculation (min) (ACUTE ONLY): 24 min   Charges:     PT Treatments $Therapeutic Exercise: 8-22 mins $Therapeutic Activity: 8-22 mins        Nicole Cunningham, PT, DPT Acute Rehabilitation Services Office [phone]   Nicole Cunningham 02/12/2022, 3:32 PM

## 2022-02-12 NOTE — Progress Notes (Signed)
PROGRESS NOTE    Nicole Cunningham  WUJ:[id number] DOB: [birth year] DOA: 01/28/2022 PCP: [name], MD   Brief Narrative:  Patient is a 61 old female with history of COPD, anemia, hypertension who was brought to the emergency department on 11/6 with altered mental status, shortness of breath.  On presentation, lab work showed creatinine of 4.1, lactic acid of 9.  CT head, chest x-ray did not show any acute findings.  CT abdomen/pelvis showed 17 mm nonobstructing renal calculus without hydronephrosis.  Patient was admitted for the management of septic shock under ICU service.  Patient had a prolonged hospital course, found to have E. coli bacteremia, echo showed EF of 20 to 25% for which cardiology consulted.  Patient was managed for cardiogenic/septic shock.  Transferred to [facility] service on 11/18.   Significant events as per PCCM :   11/6 septic shock on levo , ETT >> 11/6 Echo EF 20 to 25% global hypokinesis, apex intact 11/6 RIJ HD cath >> dobutamine at 5 mics 11/7>>Dobutamine increased to 7.5 mics , 1 unit PRBC and 1 unit platelet transfusion 11/8 dobutamine increased to 10 mics 11/10 dobut wean, diuretic, pigtail  11/13 restarted levophed overnight , Short run of SVT, weaned Dobutamine to 3 , repleted K, 1 Unit PRBC per cards ( HGB 7.2)  Assessment & Plan:   Principal Problem:   Septic shock (HCC) Active Problems:   Sepsis (HCC)   Acute encephalopathy   Pressure injury of skin  Shock: Mixed cardiogenic and septic shock.  Suspicion for sepsis cardiomyopathy.  Heart failure team following, plan for cardiac cath when appropriate.  Dobutamine stopped.  Off vasopressors. Remains in sinus tachycardia with stable blood pressure.  Monitor on telemetry.  Repeat echo is ordered by cardiology.   Bacteremia: Blood culture/urine culture showed E. coli.  Currently on meropenem, plan for 10 days total course.  Leukocytosis is improving.  Continues to have intermittent fever.  Repeat blood  cultures have been negative which was sent on 11/13.   AKI: Likely from ATN from shock.  Kidney function slowly improving.     Pancytopenia/microcytic anemia.: Thought to be sepsis related.  She has thus far received 4 units of PRBC transfusion and 2 units of platelet transfusion during this hospitalization.  Hemoglobin dropped to the range of 6 and she received 1 unit of PRBC transfusion on 02/11/2022.  Hemoglobin stable since then.  Monitor CBC daily.   Acute hypoxic respiratory failure secondary to multifocal pneumonia: History of COPD.  Currently on Brovana, Pulmicort, DuoNeb. currently on 2 L of oxygen via nasal cannula.  Tracheal aspirate has shown Enterobacter cloacae.  Continue current antibiotics. Patient was also found to have left parapneumonic effusion.  Status post pigtail placement, now has been removed.  Extubated on 11/16 with no intention to intubate.   Acute systolic CHF: Heart failure team following.  Echo showed EF of 20 to 25% with severe hypokinesis, normal RV, dilated IVC.  Heart failure team planning for left and right heart cath after improvement in the kidney function, mental status.   Septic encephalopathy:   CT head on presentation did not show any acute findings.  Currently unable to swallow, speech therapy following and recommending n.p.o.  Currently on tube feeding.  Speech therapy should continue to follow and evaluate her swallowing so that the feeding can be discontinued.  Started on Seroquel for agitation by PCCM team.  Might need to be titrated. Mental status has significantly improved and she is currently alert and oriented.  Follows commands.  However she is too weak to have conversation.   Upper extremity ischemia with dry gangrene: Present on admission.  Worse on the right side.  Also suspected to have worsening from pressures causing vaso constriction.Vascular surgery following.  Currently on low-dose heparin.  No plan for intervention.  Rx for autoamputation.    Hypokalemia/hypernatremia: Continue to monitor electrolytes.  Slightly elevated sodium, more than yesterday.  Continue free water boluses for hypernatremia.  Potassium normal.   Nonobstructing ureteral stone: We recommend follow-up with urology as an outpatient.  Thrombocytopenia: Likely sepsis driven.  Improved.   Goals of care/deconditioning/debility: Palliative care closely following.  Currently DNR.  Needs to continue discussing goals of care.  PT/OT recommending skilled facility on discharge.  After discussion with family, they are agreeable for comfort care if she declines further.   DVT prophylaxis: Place and maintain sequential compression device Start: 01/29/22 1021   Code Status: DNR  Family Communication:  None present at bedside.  Status is: Inpatient Remains inpatient appropriate because: Patient very sick.  Cardiac cath planned later this week.   Estimated body mass index is 20.74 kg/m as calculated from the following:   Height as of this encounter:  (1.626 m).   Weight as of this encounter: 54.8 kg.  Pressure Injury 02/08/22 Buttocks Right Stage 2 -  Partial thickness loss of dermis presenting as a shallow open injury with a red, pink wound bed without slough. (Active)  02/08/22 0800  Location: Buttocks  Location Orientation: Right  Staging: Stage 2 -  Partial thickness loss of dermis presenting as a shallow open injury with a red, pink wound bed without slough.  Wound Description (Comments):   Present on Admission:   Dressing Type Foam - Lift dressing to assess site every shift 02/12/22 0805   Nutritional Assessment: Body mass index is 20.74 kg/m.Marland Kitchen Seen by dietician.  I agree with the assessment and plan as outlined below: Nutrition Status: Nutrition Problem: Severe Malnutrition Etiology: chronic illness (COPD) Signs/Symptoms: severe muscle depletion, severe fat depletion Interventions: Tube feeding  . Skin Assessment: I have examined the patient's  skin and I agree with the wound assessment as performed by the wound care RN as outlined below: Pressure Injury 02/08/22 Buttocks Right Stage 2 -  Partial thickness loss of dermis presenting as a shallow open injury with a red, pink wound bed without slough. (Active)  02/08/22 0800  Location: Buttocks  Location Orientation: Right  Staging: Stage 2 -  Partial thickness loss of dermis presenting as a shallow open injury with a red, pink wound bed without slough.  Wound Description (Comments):   Present on Admission:   Dressing Type Foam - Lift dressing to assess site every shift 02/12/22 0805    Consultants:  Cardiology  Procedures:  As above  Antimicrobials:  Anti-infectives (From admission, onward)    Start     Dose/Rate Route Frequency Ordered Stop   02/04/22 1345  meropenem (MERREM) 1 g in sodium chloride 0.9 % 100 mL IVPB        1 g 200 mL/hr over 30 Minutes Intravenous Every 12 hours 02/04/22 1258 02/13/22 2359   02/01/22 2200  ceFAZolin (ANCEF) IVPB 2g/100 mL premix  Status:  Discontinued        2 g 200 mL/hr over 30 Minutes Intravenous Every 12 hours 02/01/22 0754 02/04/22 1258   01/29/22 2200  ceFEPIme (MAXIPIME) 1 g in sodium chloride 0.9 % 100 mL IVPB  Status:  Discontinued  1 g 200 mL/hr over 30 Minutes Intravenous Every 24 hours 01/29/22 0046 01/29/22 0242   01/29/22 2200  cefTRIAXone (ROCEPHIN) 2 g in sodium chloride 0.9 % 100 mL IVPB  Status:  Discontinued        2 g 200 mL/hr over 30 Minutes Intravenous Every 24 hours 01/29/22 0242 02/01/22 0754   01/29/22 0048  vancomycin variable dose per unstable renal function (pharmacist dosing)  Status:  Discontinued         Does not apply See admin instructions 01/29/22 0048 01/29/22 1343   01/28/22 2230  ceFEPIme (MAXIPIME) 2 g in sodium chloride 0.9 % 100 mL IVPB        2 g 200 mL/hr over 30 Minutes Intravenous  Once 01/28/22 2228 01/28/22 2350   01/28/22 2230  metroNIDAZOLE (FLAGYL) IVPB 500 mg        500 mg 100  mL/hr over 60 Minutes Intravenous  Once 01/28/22 2228 01/29/22 0021   01/28/22 2230  vancomycin (VANCOCIN) IVPB 1000 mg/200 mL premix        1,000 mg 200 mL/hr over 60 Minutes Intravenous  Once 01/28/22 2228 01/29/22 0150         Subjective:  Patient seen and examined.  She feels better.  She appears better and more alert today as well.  Has no complaints.  She is trying to talk but she is too weak to have a conversation, weak voice.  SLP is following.  Patient has red MEWS mostly because of tachycardia and tachypnea.  Objective: Vitals:   02/12/22 0600 02/12/22 0700 02/12/22 0731 02/12/22 0805  BP: (!) 143/65   (!) 148/71  Pulse: (!) 106 (!) 109  (!) 109  Resp: (!) 35   (!) 46  Temp:    99.2 F (37.3 C)  TempSrc:    Oral  SpO2: 98%  99% 96%  Weight:      Height:        Intake/Output Summary (Last 24 hours) at 02/12/2022 1039 Last data filed at 02/12/2022 0600 Gross per 24 hour  Intake 8608.13 ml  Output 1000 ml  Net 7608.13 ml    Filed Weights   02/10/22 0400 02/11/22 0400 02/12/22 0300  Weight: 55.5 kg 55.6 kg 54.8 kg    Examination:  General exam: Appears calm and comfortable, much more alert today Respiratory system: Diminished breath sounds at the bases,. Respiratory effort normal. Cardiovascular system: S1 & S2 heard, sinus tachycardia. No JVD, murmurs, rubs, gallops or clicks. No pedal edema. Gastrointestinal system: Abdomen is nondistended, soft and nontender. No organomegaly or masses felt. Normal bowel sounds heard. Central nervous system: Alert and oriented. No focal neurological deficits. Extremities: Symmetric 5 x 5 power.  Gangrene is fingers of both hands. Skin: No rashes, lesions or ulcers.  Psychiatry: Judgement and insight appear normal. Mood & affect appropriate.    Data Reviewed: I have personally reviewed following labs and imaging studies  CBC: Recent Labs  Lab 02/06/22 0219 02/07/22 0310 02/08/22 0436 02/09/22 0530 02/10/22 0534  02/11/22 0440 02/12/22 0420  WBC 24.5*   < > 20.1* 19.1* 16.5* 14.1* 14.2*  NEUTROABS 21.6*  --   --   --   --   --   --   HGB 8.3*   < > 7.3* 7.6* 6.8* 8.7* 8.1*  HCT 24.6*   < > 21.4* 22.8* 20.5* 25.5* 23.9*  MCV 75.0*   < > 75.1* 75.0* 74.0* 76.6* 78.4*  PLT 57*   < > 79* 120*  171 227 243   < > = values in this interval not displayed.    Basic Metabolic Panel: Recent Labs  Lab 02/06/22 0219 02/07/22 0310 02/08/22 0436 02/09/22 0530 02/10/22 0534 02/11/22 0440 02/12/22 0420  NA 152*   < > 149* 149* 149* 152* 154*  K 3.9   < > 4.5 4.1 3.6 3.6 3.7  CL 124*   < > 121* 123* 123* 130* 126*  CO2 22   < > 21* 19* 17* 16* 16*  GLUCOSE 191*   < > 180* 136* 140* 151* 151*  BUN 44*   < > 51* 48* 51* 50* 51*  CREATININE 1.88*   < > 1.69* 1.50* 1.47* 1.38* 1.31*  CALCIUM 7.4*   < > 7.8* 8.1* 8.2* 8.2* 8.4*  MG 1.3*  --   --   --   --   --   --    < > = values in this interval not displayed.    GFR: Estimated Creatinine Clearance: 38 mL/min (A) (by C-G formula based on SCr of 1.31 mg/dL (H)). Liver Function Tests: Recent Labs  Lab 02/06/22 0219  AST 135*  ALT 10  ALKPHOS 69  BILITOT 2.5*  PROT 4.4*  ALBUMIN <1.5*    No results for input(s): "LIPASE", "AMYLASE" in the last 168 hours. No results for input(s): "AMMONIA" in the last 168 hours. Coagulation Profile: No results for input(s): "INR", "PROTIME" in the last 168 hours. Cardiac Enzymes: No results for input(s): "CKTOTAL", "CKMB", "CKMBINDEX", "TROPONINI" in the last 168 hours. BNP (last 3 results) No results for input(s): "PROBNP" in the last 8760 hours. HbA1C: No results for input(s): "HGBA1C" in the last 72 hours. CBG: Recent Labs  Lab 02/11/22 1620 02/11/22 1955 02/11/22 2353 02/12/22 0356 02/12/22 0800  GLUCAP 110* 136* 138* 126* 126*    Lipid Profile: No results for input(s): "CHOL", "HDL", "LDLCALC", "TRIG", "CHOLHDL", "LDLDIRECT" in the last 72 hours. Thyroid Function Tests: No results for  input(s): "TSH", "T4TOTAL", "FREET4", "T3FREE", "THYROIDAB" in the last 72 hours. Anemia Panel: No results for input(s): "VITAMINB12", "FOLATE", "FERRITIN", "TIBC", "IRON", "RETICCTPCT" in the last 72 hours. Sepsis Labs: No results for input(s): "PROCALCITON", "LATICACIDVEN" in the last 168 hours.  Recent Results (from the past 240 hour(s))  Culture, body fluid w Gram Stain-bottle     Status: None   Collection Time: 02/02/22  1:18 PM   Specimen: Pleura  Result Value Ref Range Status   Specimen Description PLEURAL  Final   Special Requests BOTTLES DRAWN AEROBIC AND ANAEROBIC  Final   Culture   Final    NO GROWTH 5 DAYS Performed at [facility], [address], [place], [place] [address]    Report Status 02/07/2022 FINAL  Final  Gram stain     Status: None   Collection Time: 02/02/22  1:18 PM   Specimen: Pleura  Result Value Ref Range Status   Specimen Description PLEURAL  Final   Special Requests NONE  Final   Gram Stain   Final    WBC PRESENT,BOTH PMN AND MONONUCLEAR NO ORGANISMS SEEN CYTOSPIN SMEAR Performed at [facility], [address], [place], [place] [address]    Report Status 02/02/2022 FINAL  Final  Culture, blood (Routine X 2) w Reflex to ID Panel     Status: None   Collection Time: 02/05/22 11:38 AM   Specimen: BLOOD  Result Value Ref Range Status   Specimen Description BLOOD PICC LINE  Final  Special Requests   Final    BOTTLES DRAWN AEROBIC AND ANAEROBIC Blood Culture adequate volume   Culture   Final    NO GROWTH 5 DAYS Performed at [facility], [address], [place], [place] [address]    Report Status 02/10/2022 FINAL  Final  Culture, blood (Routine X 2) w Reflex to ID Panel     Status: None   Collection Time: 02/05/22 11:38 AM   Specimen: BLOOD  Result Value Ref Range Status   Specimen Description BLOOD PICC LINE  Final   Special Requests   Final    BOTTLES DRAWN AEROBIC AND ANAEROBIC Blood Culture adequate volume   Culture    Final    NO GROWTH 5 DAYS Performed at [facility], [address], [place], [place] [address]    Report Status 02/10/2022 FINAL  Final  Culture, Respiratory w Gram Stain     Status: None   Collection Time: 02/06/22 10:56 AM   Specimen: Tracheal Aspirate; Respiratory  Result Value Ref Range Status   Specimen Description TRACHEAL ASPIRATE  Final   Special Requests NONE  Final   Gram Stain   Final    NO WBC SEEN NO ORGANISMS SEEN Performed at [facility], [address] [place], [place] [address]    Culture MODERATE ENTEROBACTER CLOACAE  Final   Report Status 02/08/2022 FINAL  Final   Organism ID, Bacteria ENTEROBACTER CLOACAE  Final      Susceptibility   Enterobacter cloacae - MIC*    CEFAZOLIN >=64 RESISTANT Resistant     CEFEPIME 2 SENSITIVE Sensitive     CEFTAZIDIME >=64 RESISTANT Resistant     CIPROFLOXACIN <=0.25 SENSITIVE Sensitive     GENTAMICIN <=1 SENSITIVE Sensitive     IMIPENEM <=0.25 SENSITIVE Sensitive     TRIMETH/SULFA <=20 SENSITIVE Sensitive     PIP/TAZO >=128 RESISTANT Resistant     * MODERATE ENTEROBACTER CLOACAE     Radiology Studies: ECHOCARDIOGRAM COMPLETE  Result Date: 02/12/2022    ECHOCARDIOGRAM REPORT   Patient Name:   Nicole Cunningham Date of Exam: 02/12/2022 Medical Rec #:  [id number]        Height:       64.0 in Accession #:    [id number]       Weight:       120.8 lb Date of Birth:  [birth year]        BSA:          1.579 m Patient Age:    61 years         BP:           148/71 mmHg Patient Gender: F                HR:           117 bpm. Exam Location:  Inpatient Procedure: 2D Echo, Cardiac Doppler and Color Doppler Indications:    CHF-acute systolic  History:        Patient has prior history of Echocardiogram examinations, most                 recent 01/29/2017. COPD; Risk Factors:Hypertension and Former                 Smoker.  Sonographer:    Ross Ludwig RDCS (AE) Referring Phys: Laurey Morale  Sonographer Comments: Patient agitated  throughout test. Patient in Fowler's position. Patient uncooperative with breathing and sniff test. IMPRESSIONS  1.  Compared with echo 01/29/22, systolic function has normalized.  2. Sinus tachycardia in the 120s-130s throughout the study.  3. Intracavitary gradient. Peak velocity 1.71 m/s. Peak gradietn 11.7 mmHg. Left ventricular ejection fraction, by estimation, is 70 to 75%. The left ventricle has hyperdynamic function. The left ventricle has no regional wall motion abnormalities. Left  ventricular diastolic parameters are consistent with Grade I diastolic dysfunction (impaired relaxation).  4. Right ventricular systolic function is normal. The right ventricular size is normal. There is normal pulmonary artery systolic pressure.  5. The mitral valve is normal in structure. No evidence of mitral valve regurgitation. No evidence of mitral stenosis.  6. Aortic vavle gradients were mildly elevated, but the aortic valve leaflets are not stenotic. This may be due to high flow state. Cannot rule out subvalvular or supravalvular membrane, though none was visualized. Consider repeating limited echo to see  if gradients normalize at lower heart rate. The aortic valve is tricuspid. Aortic valve regurgitation is not visualized. No aortic stenosis is present. Aortic valve area, by VTI measures 2.38 cm. Aortic valve mean gradient measures 11.0 mmHg. Aortic valve Vmax measures 2.07 m/s.  7. The inferior vena cava is normal in size with greater than 50% respiratory variability, suggesting right atrial pressure of 3 mmHg. FINDINGS  Left Ventricle: Intracavitary gradient. Peak velocity 1.71 m/s. Peak gradietn 11.7 mmHg. Left ventricular ejection fraction, by estimation, is 70 to 75%. The left ventricle has hyperdynamic function. The left ventricle has no regional wall motion abnormalities. The left ventricular internal cavity size was normal in size. There is no left ventricular hypertrophy. Left ventricular diastolic parameters  are consistent with Grade I diastolic dysfunction (impaired relaxation). Right Ventricle: The right ventricular size is normal. No increase in right ventricular wall thickness. Right ventricular systolic function is normal. There is normal pulmonary artery systolic pressure. The tricuspid regurgitant velocity is 1.59 m/s, and  with an assumed right atrial pressure of 3 mmHg, the estimated right ventricular systolic pressure is 13.1 mmHg. Left Atrium: Left atrial size was normal in size. Right Atrium: Right atrial size was normal in size. Pericardium: There is no evidence of pericardial effusion. Mitral Valve: The mitral valve is normal in structure. No evidence of mitral valve regurgitation. No evidence of mitral valve stenosis. Tricuspid Valve: The tricuspid valve is normal in structure. Tricuspid valve regurgitation is trivial. No evidence of tricuspid stenosis. Aortic Valve: Aortic vavle gradients were mildly elevated, but the aortic valve leaflets are not stenotic. This may be due to high flow state. Cannot rule out subvalvular or supravalvular membrane, though none was visualized. Consider repeating limited echo to see if gradients normalize at lower heart rate. The aortic valve is tricuspid. Aortic valve regurgitation is not visualized. No aortic stenosis is present. Aortic valve mean gradient measures 11.0 mmHg. Aortic valve peak gradient measures 17.1 mmHg. Aortic valve area, by VTI measures 2.38 cm. Pulmonic Valve: The pulmonic valve was normal in structure. Pulmonic valve regurgitation is not visualized. No evidence of pulmonic stenosis. Aorta: The aortic root is normal in size and structure. Venous: The inferior vena cava is normal in size with greater than 50% respiratory variability, suggesting right atrial pressure of 3 mmHg. IAS/Shunts: No atrial level shunt detected by color flow Doppler.  LEFT VENTRICLE PLAX 2D LVIDd:         3.60 cm LVIDs:         3.30 cm LV PW:         0.90 cm LV IVS:  1.00  cm LVOT diam:     1.80 cm LV SV:         68 LV SV Index:   43 LVOT Area:     2.54 cm  RIGHT VENTRICLE RV Basal diam:  2.30 cm RV S prime:     20.90 cm/s TAPSE (M-mode): 1.9 cm LEFT ATRIUM             Index        RIGHT ATRIUM          Index LA diam:        2.70 cm 1.71 cm/m   RA Area:     7.58 cm LA Vol (A2C):   51.2 ml 32.42 ml/m  RA Volume:   12.70 ml 8.04 ml/m LA Vol (A4C):   41.4 ml 26.22 ml/m LA Biplane Vol: 49.5 ml 31.35 ml/m  AORTIC VALVE AV Area (Vmax):    2.46 cm AV Area (Vmean):   2.28 cm AV Area (VTI):     2.38 cm AV Vmax:           207.00 cm/s AV Vmean:          156.000 cm/s AV VTI:            0.288 m AV Peak Grad:      17.1 mmHg AV Mean Grad:      11.0 mmHg LVOT Vmax:         200.00 cm/s LVOT Vmean:        140.000 cm/s LVOT VTI:          0.269 m LVOT/AV VTI ratio: 0.93  AORTA Ao Root diam: 3.40 cm TRICUSPID VALVE TR Peak grad:   10.1 mmHg TR Vmax:        159.00 cm/s  SHUNTS Systemic VTI:  0.27 m Systemic Diam: 1.80 cm Chilton Si MD Electronically signed by Chilton Si MD Signature Date/Time: 02/12/2022/10:13:15 AM    Final     Scheduled Meds:  arformoterol  15 mcg Nebulization BID   budesonide (PULMICORT) nebulizer solution  0.25 mg Nebulization BID   Chlorhexidine Gluconate Cloth  6 each Topical Q0600   docusate  100 mg Per Tube BID   FLUoxetine  20 mg Per Tube Daily   free water  300 mL Per Tube Q3H   insulin aspart  0-6 Units Subcutaneous Q4H   isosorbide-hydrALAZINE  0.5 tablet Per Tube TID   mouth rinse  15 mL Mouth Rinse 4 times per day   polyethylene glycol  17 g Per Tube Daily   QUEtiapine  50 mg Per Tube BID   sodium chloride flush  10-40 mL Intracatheter Q12H   Continuous Infusions:  sodium chloride 10 mL/hr at 02/12/22 0600   sodium chloride Stopped (02/09/22 2311)   feeding supplement (VITAL 1.5 CAL) 50 mL/hr at 02/12/22 0600   heparin 1,250 Units/hr (02/12/22 0600)   meropenem (MERREM) IV 1 g (02/12/22 0902)     LOS: 14 days   Hughie Closs,  MD [facility]  02/12/2022, 10:39 AM   *Please note that this is a verbal dictation therefore any spelling or grammatical errors are due to the "[employer]agon [employer] One" system interpretation.  Please page via Amion and do not message via secure chat for urgent patient care matters. Secure chat can be used for non urgent patient care matters.  How to contact the [facility] Attending or Consulting provider 7A - 7P or covering provider during after hours 7P -7A, for this patient?  Check  the care team in [facility] and look for a) attending/consulting [facility] provider listed and b) the [facility] team listed. Page or secure chat 7A-7P. Log into [email or web address] and use [place]'s universal password to access. If you do not have the password, please contact the hospital operator. Locate the [facility] provider you are looking for under [facility] and page to a number that you can be directly reached. If you still have difficulty reaching the provider, please page the [facility] (Director on Call) for the Hospitalists listed on amion for assistance.

## 2022-02-12 NOTE — Progress Notes (Signed)
Daily Progress Note   Patient Name: Nicole Cunningham       Date: 02/12/2022 DOB: 07/22/1960  Age: 61 y.o. MRN#: [id number] Attending Physician: Hughie Closs, MD Primary Care Physician: [name], MD Admit Date: 01/28/2022  Reason for Consultation/Follow-up: Establishing goals of care  Patient Profile/HPI:  61 y.o. female  with past medical history of COPD, anemia, HTN presents to [facility] ED on 11/6 with AMS. The patient recently admitted to [facility] with AMS on 11/2021, found to have UTI and pyelonephritis during that admission.  On 11/5, patient having increased confusion.  Having some SOB over the past couple of days.  Brought to [facility] ED for further eval. Upon arrival to [facility] ED, patient confused but able to state name.  Admitted with shock, AKI, lactic acidosis of 9.0  She was found to have E. coli bacteremia and EF 20 to 25% on echo, mixed cardiogenic and septic shock. 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 Vascular surgery recommending heparin.  PMT was consulted for GOC conversations.   11/17- now extubated, awake, alert, smiling, nods yes to all questions, pressors off  11/20- failing swallow, MBS scheduled for tomorrow  Subjective: Chart reviewed including labs, progress notes, imaging from this and previous encounters.  On eval patient awake, alert. Smiles with interaction. Complains of pain in her left arm.     Physical Exam Vitals and nursing note reviewed.  Constitutional:      Appearance: She is ill-appearing.     Comments: frail  Cardiovascular:     Rate and Rhythm: Normal rate.  Skin:    Comments: R and left digits with dry gangrene, redness extending up to elbows  Neurological:     Mental Status: She is alert.             Vital Signs: BP 131/66 (BP  Location: Right Wrist)   Pulse (!) 117   Temp 99 F (37.2 C) (Oral)   Resp (!) 32   Ht 5\' 4"  (1.626 m)   Wt 54.8 kg   SpO2 99%   BMI 20.74 kg/m  SpO2: SpO2: 99 % O2 Device: O2 Device: Nasal Cannula O2 Flow Rate: O2 Flow Rate (L/min): 2 L/min  Intake/output summary:  Intake/Output Summary (Last 24 hours) at 02/12/2022 1220 Last data filed at 02/12/2022 0600 Gross per 24  hour  Intake 8608.13 ml  Output 1000 ml  Net 7608.13 ml    LBM: Last BM Date : 02/11/22 Baseline Weight: Weight: 40.8 kg Most recent weight: Weight: 54.8 kg       Palliative Assessment/Data: PPS: 20%      Patient Active Problem List   Diagnosis Date Noted   Pressure injury of skin 02/09/2022   Septic shock (HCC) 01/29/2022   Acute encephalopathy 01/29/2022   Protein-calorie malnutrition, severe 12/09/2021   Anxiety and depression 12/07/2021   Thrombocytopenia (HCC) 12/07/2021   Unintentional weight loss 12/06/2021   Delirium 12/06/2021   Symptomatic anemia 04/08/2021   Syncope 04/08/2021   MDD (major depressive disorder), recurrent, 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   Essential hypertension 04/25/2020   History of COVID-19 04/25/2020   History of sepsis 04/25/2020   Anemia 04/25/2020   Hypomagnesemia 04/25/2020   COVID-19 virus infection 04/20/2020   AKI (acute kidney injury) (HCC) 04/18/2020   Sepsis (HCC) 04/18/2020   Hypertensive urgency 12/15/2019   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   MDD (major depressive disorder), recurrent, in full remission (HCC) 12/14/2019   Anxiety disorder 12/14/2019    Palliative Care Assessment & Plan    Assessment/Recommendations/Plan  Continue current plan, note if patient decompensates then plan is for transition to comfort Start tylenol 1000mg  TID for pain in arms and sacral area, continue oxycodone prn PMT will continue to follow for progress versus decompensation  Code Status: DNR  Prognosis:  Unable to determine  Discharge  Planning: To Be Determined  Care plan was discussed with patient's nurse.  Thank you for allowing the Palliative Medicine Team to assist in the care of this patient.  Greater than 50%  of this time was spent counseling and coordinating care related to the above assessment and plan.  , AGNP-C Palliative Medicine   Please contact Palliative Medicine Team phone at [phone] for questions and concerns.

## 2022-02-12 NOTE — TOC CM/SW Note (Signed)
HF TOC referral for Outpatient HF Paramedicine Team to follow in community.    Isidoro Donning RN3 CCM, Heart Failure TOC CM [phone]

## 2022-02-12 NOTE — Progress Notes (Deleted)
Patient ID: Nicole Cunningham, female   DOB: 1961-02-28, 61 y.o.   MRN: [id number]   Advanced Heart Failure Rounding Note  PCP-Cardiologist: None   Subjective:    11/16 Extubated. DBA stopped.   Bronchoscopy performed 11/14 for worsening PNA>>Copious amount of tenacious secretions noted in the RM/RL/LLL, suctioned and BAL was performed. Cultures + for Enterobacter Cloacae.   Remains on meropenum. WBC 24>>21>>20K>>19.1>>14. AF.   On heparin gtt. Plt 57>>67>>79K>>120>>243. Hgb 8.1  Na 154  SCr 1.7>1.5 >1.31  Feels ok this morning, complaining of sacral pain from pressure injury. Denies CP and SOB.   Objective:   Weight Range: 54.8 kg Body mass index is 20.74 kg/m.   Vital Signs:   Temp:  [97.9 F (36.6 C)-100.7 F (38.2 C)] 99.2 F (37.3 C) (11/20 0805) Pulse Rate:  [81-134] 109 (11/20 0805) Resp:  [29-48] 46 (11/20 0805) BP: (109-148)/(44-76) 148/71 (11/20 0805) SpO2:  [95 %-100 %] 96 % (11/20 0805) Weight:  [54.8 kg] 54.8 kg (11/20 0300) Last BM Date : 02/10/22  Weight change: Filed Weights   02/10/22 0400 02/11/22 0400 02/12/22 0300  Weight: 55.5 kg 55.6 kg 54.8 kg    Intake/Output:   Intake/Output Summary (Last 24 hours) at 02/12/2022 0824 Last data filed at 02/12/2022 0600 Gross per 24 hour  Intake 8608.13 ml  Output 1000 ml  Net 7608.13 ml      Physical Exam  CVP 4 General:  No resp difficulty HEENT: normal + Cortrak  Neck: supple. no JVD. Carotids 2+ bilat; no bruits. No lymphadenopathy or thryomegaly appreciated. Cor: PMI nondisplaced. Regular rate & rhythm. No rubs, gallops or murmurs. Lungs: clear Abdomen: soft, nontender, nondistended. No hepatosplenomegaly. No bruits or masses. Good bowel sounds. Extremities: no cyanosis, clubbing, rash, edema. Ischemic hands and feet. LUE PICC  Neuro: alert & oriented to self, cranial nerves grossly intact. moves all 4 extremities w/o difficulty.  Telemetry   ST 100s (Personally reviewed)    Labs     CBC Recent Labs    02/11/22 0440 02/12/22 0420  WBC 14.1* 14.2*  HGB 8.7* 8.1*  HCT 25.5* 23.9*  MCV 76.6* 78.4*  PLT 227 962   Basic Metabolic Panel Recent Labs    02/11/22 0440 02/12/22 0420  NA 152* 154*  K 3.6 3.7  CL 130* 126*  CO2 16* 16*  GLUCOSE 151* 151*  BUN 50* 51*  CREATININE 1.38* 1.31*  CALCIUM 8.2* 8.4*   Hemoglobin A1C No results for input(s): "HGBA1C" in the last 72 hours. Imaging    No results found.   Medications:     Scheduled Medications:  arformoterol  15 mcg Nebulization BID   budesonide (PULMICORT) nebulizer solution  0.25 mg Nebulization BID   Chlorhexidine Gluconate Cloth  6 each Topical Q0600   docusate  100 mg Per Tube BID   FLUoxetine  20 mg Per Tube Daily   free water  200 mL Per Tube Q4H   insulin aspart  0-6 Units Subcutaneous Q4H   isosorbide-hydrALAZINE  0.5 tablet Per Tube TID   mouth rinse  15 mL Mouth Rinse 4 times per day   polyethylene glycol  17 g Per Tube Daily   QUEtiapine  50 mg Per Tube BID   sodium chloride flush  10-40 mL Intracatheter Q12H    Infusions:  sodium chloride 10 mL/hr at 02/12/22 0600   sodium chloride Stopped (02/09/22 2311)   feeding supplement (VITAL 1.5 CAL) 50 mL/hr at 02/12/22 0600   heparin 1,250 Units/hr (02/12/22  0600)   meropenem (MERREM) IV Stopped (02/11/22 2201)    PRN Medications: sodium chloride, acetaminophen, ipratropium-albuterol, lip balm, mouth rinse, oxyCODONE, phenol, sodium chloride flush    Patient Profile   61 y/o AAF w/ h/o HTN, COPD, anemia, h/o poor med compliance and prior admission to [facility] 9/23 for UTI and pyelonephritis, admitted w/ mixed septic and cardiogenic shock.   Assessment/Plan   1. Shock: Now resolved.  Suspect mixed septic shock and cardiogenic shock.  Lactate was > 9 with PCT > 150.  E coli bacteremia with echo showing EF 20-25%. Meropenem for broad spectrum coverage.   2. Acute systolic CHF -> cardiogenic shock: Echo showed EF 20-25% with  preservation of apical function but severe hypokinesis of other walls, normal RV, dilated IVC. Unusual wall motion abnormality pattern, prior echo was normal. ?Septic cardiomyopathy with reverse Takotsubo-type pattern.  HS-TnI was minimally elevated with no trend so doubt ACS.  She is now off dobutamine.  - Creatinine down to 1.31.   -  Would plan for LHC/RHC if/when creatinine/plts improved and mental status improved to rule out CAD. Groin access 2/2 gangrenous digits. Will do echo for now to access EF, if worsening will proceed with cath 3. AKI: In setting of septic shock.  Creatinine trending down slowly.  4. ID: E coli bacteremia.  PCT > 150 initially. WBCs down to 14  Also found to have Enterobacter cloacae in trach aspirate.  Has been having persistent fevers.    - Continue meropenem.  5. Anemia: Hgb 8.1 today - transfuse hgb < 7, last PRBC 11/18 6. Thrombocytopenia: Platelets rising, up to 243K today. Suspect fall was due to critical illness/sepsis/inflammation. 7. Acute hypoxemic respiratory failure: PNA on CXR, also suspect pulmonary edema. Extubated 11/16. Sats stable She has a pre-existing history of COPD.   8. Ischemic digits bilateral hands: Palpable brachial pulses, no palpable ulnar or radial pulses. Ischemic digits with no flow on duplex US at wrists.  Suspect pressor-induced. High risk for auto-amputation.  - Continue heparin gtt  9. Hypernatremia: Up to 154 today - increase free water boluses 321m Q3.   Length of Stay: [address] AGACNP-BC  02/12/2022, 8:24 AM  Advanced Heart Failure Team Pager [phone](M-F; 7a - 5p)  Please contact [facility] for night-coverage after hours (5p -7a ) and weekends on [email or web address]

## 2022-02-13 ENCOUNTER — Inpatient Hospital Stay (HOSPITAL_COMMUNITY): Payer: Self-pay

## 2022-02-13 LAB — CBC
HCT: 23.5 % — ABNORMAL LOW (ref 36.0–46.0)
Hemoglobin: 7.9 g/dL — ABNORMAL LOW (ref 12.0–15.0)
MCH: 26.6 pg (ref 26.0–34.0)
MCHC: 33.6 g/dL (ref 30.0–36.0)
MCV: 79.1 fL — ABNORMAL LOW (ref 80.0–100.0)
Platelets: 278 10*3/uL (ref 150–400)
RBC: 2.97 MIL/uL — ABNORMAL LOW (ref 3.87–5.11)
WBC: 11.7 10*3/uL — ABNORMAL HIGH (ref 4.0–10.5)
nRBC: 0 % (ref 0.0–0.2)

## 2022-02-13 LAB — BASIC METABOLIC PANEL
Anion gap: 11 (ref 5–15)
BUN: 52 mg/dL — ABNORMAL HIGH (ref 8–23)
CO2: 14 mmol/L — ABNORMAL LOW (ref 22–32)
Calcium: 8.3 mg/dL — ABNORMAL LOW (ref 8.9–10.3)
Chloride: 124 mmol/L — ABNORMAL HIGH (ref 98–111)
Creatinine, Ser: 1.37 mg/dL — ABNORMAL HIGH (ref 0.44–1.00)
GFR, Estimated: 43 mL/min — ABNORMAL LOW (ref >60)
Glucose, Bld: 138 mg/dL — ABNORMAL HIGH (ref 70–99)
Potassium: 3.6 mmol/L (ref 3.5–5.1)
Sodium: 149 mmol/L — ABNORMAL HIGH (ref 135–145)

## 2022-02-13 LAB — GLUCOSE, CAPILLARY
Glucose-Capillary: 125 mg/dL — ABNORMAL HIGH (ref 70–99)
Glucose-Capillary: 120 mg/dL — ABNORMAL HIGH (ref 70–99)
Glucose-Capillary: 109 mg/dL — ABNORMAL HIGH (ref 70–99)
Glucose-Capillary: 84 mg/dL (ref 70–99)
Glucose-Capillary: 109 mg/dL — ABNORMAL HIGH (ref 70–99)

## 2022-02-13 LAB — HEPARIN LEVEL (UNFRACTIONATED): Heparin Unfractionated: 0.42 IU/mL (ref 0.30–0.70)

## 2022-02-13 MED ORDER — AMLODIPINE BESYLATE 5 MG PO TABS
5.0000 mg | ORAL_TABLET | Freq: Every day | ORAL | Status: DC
  Administered 2022-02-13 – 2022-02-23 (×9): 5 mg via ORAL
  Filled 2022-02-13 (×11): qty 1

## 2022-02-13 MED ORDER — POTASSIUM CHLORIDE 20 MEQ PO PACK
20.0000 meq | PACK | Freq: Once | ORAL | Status: AC
  Administered 2022-02-13: 20 meq
  Filled 2022-02-13: qty 1

## 2022-02-13 MED ORDER — ISOSORB DINITRATE-HYDRALAZINE 20-37.5 MG PO TABS
1.0000 | ORAL_TABLET | Freq: Three times a day (TID) | ORAL | Status: DC
  Administered 2022-02-13: 1
  Filled 2022-02-13: qty 1

## 2022-02-13 MED ORDER — POTASSIUM CHLORIDE CRYS ER 20 MEQ PO TBCR: 20.0000 meq | EXTENDED_RELEASE_TABLET | Freq: Once | ORAL | Status: DC

## 2022-02-13 MED ORDER — ONDANSETRON HCL 4 MG/2ML IJ SOLN
4.0000 mg | Freq: Four times a day (QID) | INTRAMUSCULAR | Status: DC | PRN
  Administered 2022-02-13 – 2022-02-16 (×2): 4 mg via INTRAVENOUS
  Filled 2022-02-13 (×2): qty 2

## 2022-02-13 MED ORDER — LACTATED RINGERS IV BOLUS
250.0000 mL | Freq: Once | INTRAVENOUS | Status: AC
  Administered 2022-02-13: 250 mL via INTRAVENOUS

## 2022-02-13 MED ORDER — LOPERAMIDE HCL 2 MG PO CAPS
2.0000 mg | ORAL_CAPSULE | ORAL | Status: DC | PRN
  Administered 2022-02-13 – 2022-02-16 (×2): 2 mg via ORAL
  Filled 2022-02-13 (×2): qty 1

## 2022-02-13 NOTE — Progress Notes (Signed)
OT Cancellation Note  Patient Details Name: Nicole Cunningham MRN: [id number] DOB: 07/14/1960   Cancelled Treatment:    Reason Eval/Treat Not Completed: Patient at procedure or test/ unavailable (second attempt this date, pt being bathed at bed level by nsg staff. OT eval to f/u as able.)  Donia Pounds 02/13/2022, 4:31 PM

## 2022-02-13 NOTE — Progress Notes (Signed)
Nutrition Follow-up  DOCUMENTATION CODES:   Underweight, Severe malnutrition in context of chronic illness  INTERVENTION:   Tube Feeding via Cortrak (post-pyloric): resume TF Vital 1.5 @ 50 ml/hr (1200 ml/day) Tube feeding regimen provides 1800 kcal, 81 grams of protein, and 917 ml of H2O.   Current free water flush of 300 mL q 3 hours, TF plus free water flush providing 3.3 L of water   Of note, large volume of water administration per tube may be contributing to +nausea, perceived intolerance to TF. Large volumes via post pyloric tube are generally not well tolerated. If nausea and hypernatremia persist, recommend decreasing free water with addition of IV fluids until hypernatremia resolved.   MBS results pending  NUTRITION DIAGNOSIS:   Severe Malnutrition related to chronic illness (COPD) as evidenced by severe muscle depletion, severe fat depletion.  Being addressed via TF   GOAL:   Patient will meet greater than or equal to 90% of their needs  Met  MONITOR:   Diet advancement, Labs, Weight trends, TF tolerance, Skin, I & O's  REASON FOR ASSESSMENT:   Consult Enteral/tube feeding initiation and management (trickle tube feeds)  ASSESSMENT:   61 year old female who presented to the ED on 11/05 with AMS. PMH of COPD, anemia, HTN. Pt admitted with septic shock, AKI.  11/06 - intubation 11/07 - trickle TF started 11/08 - TF advanced to goal, Cortrak placed (tip gastric) 11/09 - TF held due to vomiting, OG tube placed to LIWS 11/10 - Cortrak advanced post-pyloric (tip "probably transpyloric in the proximal jejunum"), insertion of chest tube 11/16 - extubated  Noted pt is a DNR; per MD notes, family is agreeable to comfort care if pt declines further  Pt remains NPO. Extremely weak. MBS today  TF held this AM for +nausea. Cortrak is post-pyloric, bypassing stomach. Recommend nausea med administration and then resuming TF. Nothing ordered this AM, RN obtained prn  zofran orders for pt  Last BM 11/20 large type 6. No recent abd xray  Weight up to 59.2 kg. Admit wt 42.2 kg. Mild generalized edema on exam. Noted pt likely changed beds as well during admission as pt initially in ICU and now on the floor  Hypernatremia persists but improving; free water flush increased to 300 mL q 3 hours today. Note Cortrak is in post pyloric position, this volume of water may not be well tolerated  Labs: sodium 149  (H), Creatinine 1.67, BUN 52 Meds: miralax, ss novolog, colace   Diet Order:   Diet Order             Diet NPO time specified  Diet effective now                   EDUCATION NEEDS:   Not appropriate for education at this time  Skin:  Skin Assessment: Reviewed RN Assessment (skin tear to perineum)  Last BM:  02/08/22 small type 7  Height:   Ht Readings from Last 1 Encounters:  02/06/22 5' 4" (1.626 m)    Weight:   Wt Readings from Last 1 Encounters:  02/13/22 59.2 kg    Ideal Body Weight:  54.5 kg  BMI:  Body mass index is 22.4 kg/m.  Estimated Nutritional Needs:   Kcal:  1700-1900  Protein:  75-90 grams  Fluid:  1.7-1.9 L   Kerman Passey MS, RDN, LDN, CNSC Registered Dietitian 3 Clinical Nutrition RD Pager and On-Call Pager Number Located in [place]

## 2022-02-13 NOTE — Progress Notes (Signed)
PROGRESS NOTE    Nicole Cunningham  MHD:[id number] DOB: Aug 05, 1960 DOA: 01/28/2022 PCP: [name], MD   Brief Narrative:  Patient is a 61 old female with history of COPD, anemia, hypertension who was brought to the emergency department on 11/6 with altered mental status, shortness of breath.  On presentation, lab work showed creatinine of 4.1, lactic acid of 9.  CT head, chest x-ray did not show any acute findings.  CT abdomen/pelvis showed 17 mm nonobstructing renal calculus without hydronephrosis.  Patient was admitted for the management of septic shock under ICU service.  Patient had a prolonged hospital course, found to have E. coli bacteremia, echo showed EF of 20 to 25% for which cardiology consulted.  Patient was managed for cardiogenic/septic shock.  Transferred to [facility] service on 11/18.   Significant events as per PCCM :   11/6 septic shock on levo , ETT >> 11/6 Echo EF 20 to 25% global hypokinesis, apex intact 11/6 RIJ HD cath >> dobutamine at 5 mics 11/7>>Dobutamine increased to 7.5 mics , 1 unit PRBC and 1 unit platelet transfusion 11/8 dobutamine increased to 10 mics 11/10 dobut wean, diuretic, pigtail  11/13 restarted levophed overnight , Short run of SVT, weaned Dobutamine to 3 , repleted K, 1 Unit PRBC per cards ( HGB 7.2)  Assessment & Plan:   Principal Problem:   Septic shock (HCC) Active Problems:   Sepsis (HCC)   Acute encephalopathy   Pressure injury of skin  Shock: Mixed cardiogenic and septic shock.  Suspicion for sepsis cardiomyopathy.  Heart failure team following, plan for cardiac cath when appropriate.  Dobutamine stopped.  Off vasopressors. Remains in sinus tachycardia with stable blood pressure.  Monitor on telemetry.  Repeat echo shows 70% ejection fraction/hyperdynamic left ventricular.  No plans for cardiac cath.  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   Bacteremia: Blood culture/urine culture showed E. coli.  Currently on meropenem, plan for 10  days total course.  Leukocytosis is improving.  Continues to have intermittent fever.  Repeat blood cultures have been negative which was sent on 11/13.   AKI: Likely from ATN from shock.  Renal function has stabilized.  She is getting some IV fluids by cardiology today.   Pancytopenia/microcytic anemia.: Thought to be sepsis related.  She has thus far received 4 units of PRBC transfusion and 2 units of platelet transfusion during this hospitalization.  Hemoglobin dropped to the range of 6 and she received 1 unit of PRBC transfusion on 02/11/2022.  Hemoglobin stable since then.  Monitor CBC daily.   Acute hypoxic respiratory failure secondary to multifocal pneumonia: History of COPD.  Currently on Brovana, Pulmicort, DuoNeb. currently on 2 L of oxygen via nasal cannula.  Tracheal aspirate has shown Enterobacter cloacae.  Continue current antibiotics. Patient was also found to have left parapneumonic effusion.  Status post pigtail placement, now has been removed.  Extubated on 11/16 with no intention to intubate.   Acute systolic CHF: Heart failure team following.  Echo showed EF of 20 to 25% with severe hypokinesis, normal RV, dilated IVC.  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  She is getting some IV fluids today.  Cardiology managing.   Septic encephalopathy:   CT head on presentation did not show any acute findings.  Currently unable to swallow, speech therapy following and recommending n.p.o.  Currently on tube feeding.  Speech therapy should continue to follow and evaluate  her swallowing so that the feeding can be discontinued.  Started on Seroquel for agitation by PCCM team.  Might need to be titrated. Mental status has significantly improved and she is currently alert and oriented.  Follows commands.     Upper extremity ischemia with dry gangrene: Present on admission.  Worse on the  right side.  Also suspected to have worsening from pressures causing vaso constriction.Vascular surgery following.  Currently on low-dose heparin.  No plan for intervention.  Rx for autoamputation.  Vascular surgery following.   Hypokalemia/hypernatremia: Continue to monitor electrolytes.  Some improvement in sodium.  Continue free water boluses for hypernatremia.  Potassium normal.   Nonobstructing ureteral stone: We recommend follow-up with urology as an outpatient.  Thrombocytopenia: Likely sepsis driven.  Improved.   Goals of care/deconditioning/debility: Palliative care closely following.  Currently DNR.  Needs to continue discussing goals of care.  PT/OT recommending skilled facility on discharge.  After discussion with family, they are agreeable for comfort care if she declines further.   DVT prophylaxis: Place and maintain sequential compression device Start: 01/29/22 1021   Code Status: DNR  Family Communication:  None present at bedside.  Status is: Inpatient Remains inpatient appropriate because: Patient very sick.     Estimated body mass index is 22.4 kg/m as calculated from the following:   Height as of this encounter: 5\' 4"  (1.626 m).   Weight as of this encounter: 59.2 kg.  Pressure Injury 02/08/22 Buttocks Right Stage 2 -  Partial thickness loss of dermis presenting as a shallow open injury with a red, pink wound bed without slough. (Active)  02/08/22 0800  Location: Buttocks  Location Orientation: Right  Staging: Stage 2 -  Partial thickness loss of dermis presenting as a shallow open injury with a red, pink wound bed without slough.  Wound Description (Comments):   Present on Admission:   Dressing Type Foam - Lift dressing to assess site every shift 02/13/22 0820   Nutritional Assessment: Body mass index is 22.4 kg/m.Marland Kitchen Seen by dietician.  I agree with the assessment and plan as outlined below: Nutrition Status: Nutrition Problem: Severe Malnutrition Etiology:  chronic illness (COPD) Signs/Symptoms: severe muscle depletion, severe fat depletion Interventions: Tube feeding  . Skin Assessment: I have examined the patient's skin and I agree with the wound assessment as performed by the wound care RN as outlined below: Pressure Injury 02/08/22 Buttocks Right Stage 2 -  Partial thickness loss of dermis presenting as a shallow open injury with a red, pink wound bed without slough. (Active)  02/08/22 0800  Location: Buttocks  Location Orientation: Right  Staging: Stage 2 -  Partial thickness loss of dermis presenting as a shallow open injury with a red, pink wound bed without slough.  Wound Description (Comments):   Present on Admission:   Dressing Type Foam - Lift dressing to assess site every shift 02/13/22 0820    Consultants:  Cardiology Vascular surgery Palliative care Procedures:  As above  Antimicrobials:  Anti-infectives (From admission, onward)    Start     Dose/Rate Route Frequency Ordered Stop   02/04/22 1345  meropenem (MERREM) 1 g in sodium chloride 0.9 % 100 mL IVPB        1 g 200 mL/hr over 30 Minutes Intravenous Every 12 hours 02/04/22 1258 02/13/22 2359   02/01/22 2200  ceFAZolin (ANCEF) IVPB 2g/100 mL premix  Status:  Discontinued        2 g 200 mL/hr over 30 Minutes Intravenous Every  12 hours 02/01/22 0754 02/04/22 1258   01/29/22 2200  ceFEPIme (MAXIPIME) 1 g in sodium chloride 0.9 % 100 mL IVPB  Status:  Discontinued        1 g 200 mL/hr over 30 Minutes Intravenous Every 24 hours 01/29/22 0046 01/29/22 0242   01/29/22 2200  cefTRIAXone (ROCEPHIN) 2 g in sodium chloride 0.9 % 100 mL IVPB  Status:  Discontinued        2 g 200 mL/hr over 30 Minutes Intravenous Every 24 hours 01/29/22 0242 02/01/22 0754   01/29/22 0048  vancomycin variable dose per unstable renal function (pharmacist dosing)  Status:  Discontinued         Does not apply See admin instructions 01/29/22 0048 01/29/22 1343   01/28/22 2230  ceFEPIme  (MAXIPIME) 2 g in sodium chloride 0.9 % 100 mL IVPB        2 g 200 mL/hr over 30 Minutes Intravenous  Once 01/28/22 2228 01/28/22 2350   01/28/22 2230  metroNIDAZOLE (FLAGYL) IVPB 500 mg        500 mg 100 mL/hr over 60 Minutes Intravenous  Once 01/28/22 2228 01/29/22 0021   01/28/22 2230  vancomycin (VANCOCIN) IVPB 1000 mg/200 mL premix        1,000 mg 200 mL/hr over 60 Minutes Intravenous  Once 01/28/22 2228 01/29/22 0150         Subjective:  Seen and examined.  She is improving and getting more and more alert every day, she is oriented as well and she has no complaints.  Although she is very weak with soft voice.  Objective: Vitals:   02/13/22 0320 02/13/22 0735 02/13/22 0738 02/13/22 0757  BP: 136/70   139/67  Pulse: (!) 117 (!) 108  (!) 108  Resp: (!) 32 (!) 32  (!) 29  Temp: 100.1 F (37.8 C)   98.1 F (36.7 C)  TempSrc: Oral   Oral  SpO2: 97% 97% 99% 98%  Weight: 59.2 kg     Height:        Intake/Output Summary (Last 24 hours) at 02/13/2022 1041 Last data filed at 02/13/2022 0431 Gross per 24 hour  Intake 3232.92 ml  Output 1100 ml  Net 2132.92 ml    Filed Weights   02/11/22 0400 02/12/22 0300 02/13/22 0320  Weight: 55.6 kg 54.8 kg 59.2 kg    Examination:  General exam: Appears calm and comfortable  Respiratory system: Clear to auscultation. Respiratory effort normal. Cardiovascular system: S1 & S2 heard, RRR. No JVD, murmurs, rubs, gallops or clicks. No pedal edema. Gastrointestinal system: Abdomen is nondistended, soft and nontender. No organomegaly or masses felt. Normal bowel sounds heard. Central nervous system: Alert and oriented. No focal neurological deficits. Extremities: Symmetric 5 x 5 power.  Gangrenous fingers in both hands.  Data Reviewed: I have personally reviewed following labs and imaging studies  CBC: Recent Labs  Lab 02/09/22 0530 02/10/22 0534 02/11/22 0440 02/12/22 0420 02/13/22 0340  WBC 19.1* 16.5* 14.1* 14.2* 11.7*  HGB  7.6* 6.8* 8.7* 8.1* 7.9*  HCT 22.8* 20.5* 25.5* 23.9* 23.5*  MCV 75.0* 74.0* 76.6* 78.4* 79.1*  PLT 120* 171 227 243 278    Basic Metabolic Panel: Recent Labs  Lab 02/09/22 0530 02/10/22 0534 02/11/22 0440 02/12/22 0420 02/13/22 0340  NA 149* 149* 152* 154* 149*  K 4.1 3.6 3.6 3.7 3.6  CL 123* 123* 130* 126* 124*  CO2 19* 17* 16* 16* 14*  GLUCOSE 136* 140* 151* 151* 138*  BUN  48* 51* 50* 51* 52*  CREATININE 1.50* 1.47* 1.38* 1.31* 1.37*  CALCIUM 8.1* 8.2* 8.2* 8.4* 8.3*    GFR: Estimated Creatinine Clearance: 36.3 mL/min (A) (by C-G formula based on SCr of 1.37 mg/dL (H)). Liver Function Tests: No results for input(s): "AST", "ALT", "ALKPHOS", "BILITOT", "PROT", "ALBUMIN" in the last 168 hours.  No results for input(s): "LIPASE", "AMYLASE" in the last 168 hours. No results for input(s): "AMMONIA" in the last 168 hours. Coagulation Profile: No results for input(s): "INR", "PROTIME" in the last 168 hours. Cardiac Enzymes: No results for input(s): "CKTOTAL", "CKMB", "CKMBINDEX", "TROPONINI" in the last 168 hours. BNP (last 3 results) No results for input(s): "PROBNP" in the last 8760 hours. HbA1C: No results for input(s): "HGBA1C" in the last 72 hours. CBG: Recent Labs  Lab 02/12/22 1546 02/12/22 1955 02/12/22 2354 02/13/22 0330 02/13/22 0755  GLUCAP 131* 108* 141* 125* 120*    Lipid Profile: No results for input(s): "CHOL", "HDL", "LDLCALC", "TRIG", "CHOLHDL", "LDLDIRECT" in the last 72 hours. Thyroid Function Tests: No results for input(s): "TSH", "T4TOTAL", "FREET4", "T3FREE", "THYROIDAB" in the last 72 hours. Anemia Panel: No results for input(s): "VITAMINB12", "FOLATE", "FERRITIN", "TIBC", "IRON", "RETICCTPCT" in the last 72 hours. Sepsis Labs: No results for input(s): "PROCALCITON", "LATICACIDVEN" in the last 168 hours.  Recent Results (from the past 240 hour(s))  Culture, blood (Routine X 2) w Reflex to ID Panel     Status: None   Collection Time:  02/05/22 11:38 AM   Specimen: BLOOD  Result Value Ref Range Status   Specimen Description BLOOD PICC LINE  Final   Special Requests   Final    BOTTLES DRAWN AEROBIC AND ANAEROBIC Blood Culture adequate volume   Culture   Final    NO GROWTH 5 DAYS Performed at [facility], [address] [place], [place] [address]    Report Status 02/10/2022 FINAL  Final  Culture, blood (Routine X 2) w Reflex to ID Panel     Status: None   Collection Time: 02/05/22 11:38 AM   Specimen: BLOOD  Result Value Ref Range Status   Specimen Description BLOOD PICC LINE  Final   Special Requests   Final    BOTTLES DRAWN AEROBIC AND ANAEROBIC Blood Culture adequate volume   Culture   Final    NO GROWTH 5 DAYS Performed at [facility], [address] [place], [place] [address]    Report Status 02/10/2022 FINAL  Final  Culture, Respiratory w Gram Stain     Status: None   Collection Time: 02/06/22 10:56 AM   Specimen: Tracheal Aspirate; Respiratory  Result Value Ref Range Status   Specimen Description TRACHEAL ASPIRATE  Final   Special Requests NONE  Final   Gram Stain   Final    NO WBC SEEN NO ORGANISMS SEEN Performed at [facility], [address] [place], [place] [address]    Culture MODERATE ENTEROBACTER CLOACAE  Final   Report Status 02/08/2022 FINAL  Final   Organism ID, Bacteria ENTEROBACTER CLOACAE  Final      Susceptibility   Enterobacter cloacae - MIC*    CEFAZOLIN >=64 RESISTANT Resistant     CEFEPIME 2 SENSITIVE Sensitive     CEFTAZIDIME >=64 RESISTANT Resistant     CIPROFLOXACIN <=0.25 SENSITIVE Sensitive     GENTAMICIN <=1 SENSITIVE Sensitive     IMIPENEM <=0.25 SENSITIVE Sensitive     TRIMETH/SULFA <=20 SENSITIVE Sensitive     PIP/TAZO >=128 RESISTANT Resistant     *  MODERATE ENTEROBACTER CLOACAE     Radiology Studies: ECHOCARDIOGRAM COMPLETE  Result Date: 02/12/2022    ECHOCARDIOGRAM REPORT   Patient Name:   Nicole Cunningham Date of Exam: 02/12/2022  Medical Rec #:  [id number]        Height:       64.0 in Accession #:    [id number]       Weight:       120.8 lb Date of Birth:  1960-12-03        BSA:          1.579 m Patient Age:    61 years         BP:           148/71 mmHg Patient Gender: F                HR:           117 bpm. Exam Location:  Inpatient Procedure: 2D Echo, Cardiac Doppler and Color Doppler Indications:    CHF-acute systolic  History:        Patient has prior history of Echocardiogram examinations, most                 recent 01/29/2017. COPD; Risk Factors:Hypertension and Former                 Smoker.  Sonographer:    Ross Ludwig RDCS (AE) Referring Phys: Laurey Morale  Sonographer Comments: Patient agitated throughout test. Patient in Fowler's position. Patient uncooperative with breathing and sniff test. IMPRESSIONS  1. Compared with echo 01/29/22, systolic function has normalized.  2. Sinus tachycardia in the 120s-130s throughout the study.  3. Intracavitary gradient. Peak velocity 1.71 m/s. Peak gradietn 11.7 mmHg. Left ventricular ejection fraction, by estimation, is 70 to 75%. The left ventricle has hyperdynamic function. The left ventricle has no regional wall motion abnormalities. Left  ventricular diastolic parameters are consistent with Grade I diastolic dysfunction (impaired relaxation).  4. Right ventricular systolic function is normal. The right ventricular size is normal. There is normal pulmonary artery systolic pressure.  5. The mitral valve is normal in structure. No evidence of mitral valve regurgitation. No evidence of mitral stenosis.  6. Aortic vavle gradients were mildly elevated, but the aortic valve leaflets are not stenotic. This may be due to high flow state. Cannot rule out subvalvular or supravalvular membrane, though none was visualized. Consider repeating limited echo to see  if gradients normalize at lower heart rate. The aortic valve is tricuspid. Aortic valve regurgitation is not visualized. No aortic stenosis is  present. Aortic valve area, by VTI measures 2.38 cm. Aortic valve mean gradient measures 11.0 mmHg. Aortic valve Vmax measures 2.07 m/s.  7. The inferior vena cava is normal in size with greater than 50% respiratory variability, suggesting right atrial pressure of 3 mmHg. FINDINGS  Left Ventricle: Intracavitary gradient. Peak velocity 1.71 m/s. Peak gradietn 11.7 mmHg. Left ventricular ejection fraction, by estimation, is 70 to 75%. The left ventricle has hyperdynamic function. The left ventricle has no regional wall motion abnormalities. The left ventricular internal cavity size was normal in size. There is no left ventricular hypertrophy. Left ventricular diastolic parameters are consistent with Grade I diastolic dysfunction (impaired relaxation). Right Ventricle: The right ventricular size is normal. No increase in right ventricular wall thickness. Right ventricular systolic function is normal. There is normal pulmonary artery systolic pressure. The tricuspid regurgitant velocity is 1.59 m/s, and  with an assumed right atrial pressure  of 3 mmHg, the estimated right ventricular systolic pressure is 13.1 mmHg. Left Atrium: Left atrial size was normal in size. Right Atrium: Right atrial size was normal in size. Pericardium: There is no evidence of pericardial effusion. Mitral Valve: The mitral valve is normal in structure. No evidence of mitral valve regurgitation. No evidence of mitral valve stenosis. Tricuspid Valve: The tricuspid valve is normal in structure. Tricuspid valve regurgitation is trivial. No evidence of tricuspid stenosis. Aortic Valve: Aortic vavle gradients were mildly elevated, but the aortic valve leaflets are not stenotic. This may be due to high flow state. Cannot rule out subvalvular or supravalvular membrane, though none was visualized. Consider repeating limited echo to see if gradients normalize at lower heart rate. The aortic valve is tricuspid. Aortic valve regurgitation is not  visualized. No aortic stenosis is present. Aortic valve mean gradient measures 11.0 mmHg. Aortic valve peak gradient measures 17.1 mmHg. Aortic valve area, by VTI measures 2.38 cm. Pulmonic Valve: The pulmonic valve was normal in structure. Pulmonic valve regurgitation is not visualized. No evidence of pulmonic stenosis. Aorta: The aortic root is normal in size and structure. Venous: The inferior vena cava is normal in size with greater than 50% respiratory variability, suggesting right atrial pressure of 3 mmHg. IAS/Shunts: No atrial level shunt detected by color flow Doppler.  LEFT VENTRICLE PLAX 2D LVIDd:         3.60 cm LVIDs:         3.30 cm LV PW:         0.90 cm LV IVS:        1.00 cm LVOT diam:     1.80 cm LV SV:         68 LV SV Index:   43 LVOT Area:     2.54 cm  RIGHT VENTRICLE RV Basal diam:  2.30 cm RV S prime:     20.90 cm/s TAPSE (M-mode): 1.9 cm LEFT ATRIUM             Index        RIGHT ATRIUM          Index LA diam:        2.70 cm 1.71 cm/m   RA Area:     7.58 cm LA Vol (A2C):   51.2 ml 32.42 ml/m  RA Volume:   12.70 ml 8.04 ml/m LA Vol (A4C):   41.4 ml 26.22 ml/m LA Biplane Vol: 49.5 ml 31.35 ml/m  AORTIC VALVE AV Area (Vmax):    2.46 cm AV Area (Vmean):   2.28 cm AV Area (VTI):     2.38 cm AV Vmax:           207.00 cm/s AV Vmean:          156.000 cm/s AV VTI:            0.288 m AV Peak Grad:      17.1 mmHg AV Mean Grad:      11.0 mmHg LVOT Vmax:         200.00 cm/s LVOT Vmean:        140.000 cm/s LVOT VTI:          0.269 m LVOT/AV VTI ratio: 0.93  AORTA Ao Root diam: 3.40 cm TRICUSPID VALVE TR Peak grad:   10.1 mmHg TR Vmax:        159.00 cm/s  SHUNTS Systemic VTI:  0.27 m Systemic Diam: 1.80 cm Chilton Si MD Electronically signed by Chilton Si MD Signature  Date/Time: 02/12/2022/10:13:15 AM    Final     Scheduled Meds:  acetaminophen  1,000 mg Per Tube TID   amLODipine  5 mg Oral Daily   arformoterol  15 mcg Nebulization BID   budesonide (PULMICORT) nebulizer solution   0.25 mg Nebulization BID   carvedilol  3.125 mg Oral BID WC   Chlorhexidine Gluconate Cloth  6 each Topical Q0600   docusate  100 mg Per Tube BID   FLUoxetine  20 mg Per Tube Daily   free water  300 mL Per Tube Q3H   insulin aspart  0-6 Units Subcutaneous Q4H   mouth rinse  15 mL Mouth Rinse 4 times per day   polyethylene glycol  17 g Per Tube Daily   QUEtiapine  50 mg Per Tube BID   sodium chloride flush  10-40 mL Intracatheter Q12H   Continuous Infusions:  sodium chloride 10 mL/hr at 02/12/22 0600   sodium chloride Stopped (02/09/22 2311)   feeding supplement (VITAL 1.5 CAL) Stopped (02/13/22 7048)   heparin 1,250 Units/hr (02/13/22 0320)   meropenem (MERREM) IV 1 g (02/13/22 0923)     LOS: 15 days   Hughie Closs, MD [facility]  02/13/2022, 10:41 AM   *Please note that this is a verbal dictation therefore any spelling or grammatical errors are due to the "Dragon Medical One" system interpretation.  Please page via Amion and do not message via secure chat for urgent patient care matters. Secure chat can be used for non urgent patient care matters.  How to contact the [facility] Attending or Consulting provider 7A - 7P or covering provider during after hours 7P -7A, for this patient?  Check the care team in [facility] and look for a) attending/consulting [facility] provider listed and b) the [facility] team listed. Page or secure chat 7A-7P. Log into [email or web address] and use [facility]'s universal password to access. If you do not have the password, please contact the hospital operator. Locate the [facility] provider you are looking for under [facility] and page to a number that you can be directly reached. If you still have difficulty reaching the provider, please page the [facility] (Director on Call) for the Hospitalists listed on amion for assistance.

## 2022-02-13 NOTE — Progress Notes (Addendum)
Patient ID: Nicole Cunningham, female   DOB: Dec 01, 1960, 61 y.o.   MRN: [id number]   Advanced Heart Failure Rounding Note  PCP-Cardiologist: None   Subjective:   11/16 Extubated. DBA stopped.  Bronchoscopy performed 11/14 for worsening PNA>>Copious amount of tenacious secretions noted in the RM/RL/LLL, suctioned and BAL was performed. Cultures + for Enterobacter Cloacae.  She remains on meropenem. WBCs 14.2 today.   On heparin gtt. Platelets now normal. She had 1 unit PRBCs on 11/18.   Na improving 154>149 with increased FWF 300 q3 boluses. She is NPO except for sips and is getting tube feeds.   SCr 1.7>1.5>1.31>1.37  CVP ~2  Repeat echo yesterday showed EF now 70-75%, G1DD, RV function normal, No MR, trivial TR  She is alert/oriented, able to answer questions.  Denies CP and SOB.   Objective:   Weight Range: 59.2 kg Body mass index is 22.4 kg/m.   Vital Signs:   Temp:  [98 F (36.7 C)-100.9 F (38.3 C)] 100.1 F (37.8 C) (11/21 0320) Pulse Rate:  [109-117] 117 (11/21 0320) Resp:  [32-46] 32 (11/21 0320) BP: (117-148)/(56-72) 136/70 (11/21 0320) SpO2:  [94 %-100 %] 97 % (11/21 0320) Weight:  [59.2 kg] 59.2 kg (11/21 0320) Last BM Date : 02/13/22  Weight change: Filed Weights   02/11/22 0400 02/12/22 0300 02/13/22 0320  Weight: 55.6 kg 54.8 kg 59.2 kg    Intake/Output:   Intake/Output Summary (Last 24 hours) at 02/13/2022 0732 Last data filed at 02/13/2022 0431 Gross per 24 hour  Intake 3232.92 ml  Output 1100 ml  Net 2132.92 ml      Physical Exam  CVP ~2 General:  cachectic appearing.  No respiratory difficulty HEENT: +cortrak Neck: supple. No JVD. Carotids 2+ bilat; no bruits. No lymphadenopathy or thyromegaly appreciated. Cor: PMI nondisplaced. Regular rate & rhythm. No rubs, gallops or murmurs. Lungs: clear Abdomen: soft, nontender, nondistended. No hepatosplenomegaly. No bruits or masses. Good bowel sounds. Extremities: no clubbing, rash, +1 BLE  edema. Ischemic digits Neuro: alert & oriented x 3, cranial nerves grossly intact. moves all 4 extremities  Affect pleasant.   Telemetry   ST 110s (Personally reviewed)    Labs    CBC Recent Labs    02/12/22 0420 02/13/22 0340  WBC 14.2* 11.7*  HGB 8.1* 7.9*  HCT 23.9* 23.5*  MCV 78.4* 79.1*  PLT 243 620   Basic Metabolic Panel Recent Labs    02/12/22 0420 02/13/22 0340  NA 154* 149*  K 3.7 3.6  CL 126* 124*  CO2 16* 14*  GLUCOSE 151* 138*  BUN 51* 52*  CREATININE 1.31* 1.37*  CALCIUM 8.4* 8.3*   Hemoglobin A1C No results for input(s): "HGBA1C" in the last 72 hours. Imaging    ECHOCARDIOGRAM COMPLETE  Result Date: 02/12/2022    ECHOCARDIOGRAM REPORT   Patient Name:   Nicole Cunningham Date of Exam: 02/12/2022 Medical Rec #:  [id number]        Height:       64.0 in Accession #:    [id number]       Weight:       120.8 lb Date of Birth:  1961-02-04        BSA:          1.579 m Patient Age:    34 years         BP:           148/71 mmHg Patient Gender: F  HR:           117 bpm. Exam Location:  Inpatient Procedure: 2D Echo, Cardiac Doppler and Color Doppler Indications:    CHF-acute systolic  History:        Patient has prior history of Echocardiogram examinations, most                 recent 01/29/2017. COPD; Risk Factors:Hypertension and Former                 Smoker.  Sonographer:    Clayton Lefort RDCS (AE) Referring Phys: Larey Dresser  Sonographer Comments: Patient agitated throughout test. Patient in Fowler's position. Patient uncooperative with breathing and sniff test. IMPRESSIONS  1. Compared with echo 73/4/19, systolic function has normalized.  2. Sinus tachycardia in the 120s-130s throughout the study.  3. Intracavitary gradient. Peak velocity 1.71 m/s. Peak gradietn 11.7 mmHg. Left ventricular ejection fraction, by estimation, is 70 to 75%. The left ventricle has hyperdynamic function. The left ventricle has no regional wall motion abnormalities. Left   ventricular diastolic parameters are consistent with Grade I diastolic dysfunction (impaired relaxation).  4. Right ventricular systolic function is normal. The right ventricular size is normal. There is normal pulmonary artery systolic pressure.  5. The mitral valve is normal in structure. No evidence of mitral valve regurgitation. No evidence of mitral stenosis.  6. Aortic vavle gradients were mildly elevated, but the aortic valve leaflets are not stenotic. This may be due to high flow state. Cannot rule out subvalvular or supravalvular membrane, though none was visualized. Consider repeating limited echo to see  if gradients normalize at lower heart rate. The aortic valve is tricuspid. Aortic valve regurgitation is not visualized. No aortic stenosis is present. Aortic valve area, by VTI measures 2.38 cm. Aortic valve mean gradient measures 11.0 mmHg. Aortic valve Vmax measures 2.07 m/s.  7. The inferior vena cava is normal in size with greater than 50% respiratory variability, suggesting right atrial pressure of 3 mmHg. FINDINGS  Left Ventricle: Intracavitary gradient. Peak velocity 1.71 m/s. Peak gradietn 11.7 mmHg. Left ventricular ejection fraction, by estimation, is 70 to 75%. The left ventricle has hyperdynamic function. The left ventricle has no regional wall motion abnormalities. The left ventricular internal cavity size was normal in size. There is no left ventricular hypertrophy. Left ventricular diastolic parameters are consistent with Grade I diastolic dysfunction (impaired relaxation). Right Ventricle: The right ventricular size is normal. No increase in right ventricular wall thickness. Right ventricular systolic function is normal. There is normal pulmonary artery systolic pressure. The tricuspid regurgitant velocity is 1.59 m/s, and  with an assumed right atrial pressure of 3 mmHg, the estimated right ventricular systolic pressure is 37.9 mmHg. Left Atrium: Left atrial size was normal in size.  Right Atrium: Right atrial size was normal in size. Pericardium: There is no evidence of pericardial effusion. Mitral Valve: The mitral valve is normal in structure. No evidence of mitral valve regurgitation. No evidence of mitral valve stenosis. Tricuspid Valve: The tricuspid valve is normal in structure. Tricuspid valve regurgitation is trivial. No evidence of tricuspid stenosis. Aortic Valve: Aortic vavle gradients were mildly elevated, but the aortic valve leaflets are not stenotic. This may be due to high flow state. Cannot rule out subvalvular or supravalvular membrane, though none was visualized. Consider repeating limited echo to see if gradients normalize at lower heart rate. The aortic valve is tricuspid. Aortic valve regurgitation is not visualized. No aortic stenosis is present. Aortic valve mean  gradient measures 11.0 mmHg. Aortic valve peak gradient measures 17.1 mmHg. Aortic valve area, by VTI measures 2.38 cm. Pulmonic Valve: The pulmonic valve was normal in structure. Pulmonic valve regurgitation is not visualized. No evidence of pulmonic stenosis. Aorta: The aortic root is normal in size and structure. Venous: The inferior vena cava is normal in size with greater than 50% respiratory variability, suggesting right atrial pressure of 3 mmHg. IAS/Shunts: No atrial level shunt detected by color flow Doppler.  LEFT VENTRICLE PLAX 2D LVIDd:         3.60 cm LVIDs:         3.30 cm LV PW:         0.90 cm LV IVS:        1.00 cm LVOT diam:     1.80 cm LV SV:         68 LV SV Index:   43 LVOT Area:     2.54 cm  RIGHT VENTRICLE RV Basal diam:  2.30 cm RV S prime:     20.90 cm/s TAPSE (M-mode): 1.9 cm LEFT ATRIUM             Index        RIGHT ATRIUM          Index LA diam:        2.70 cm 1.71 cm/m   RA Area:     7.58 cm LA Vol (A2C):   51.2 ml 32.42 ml/m  RA Volume:   12.70 ml 8.04 ml/m LA Vol (A4C):   41.4 ml 26.22 ml/m LA Biplane Vol: 49.5 ml 31.35 ml/m  AORTIC VALVE AV Area (Vmax):    2.46 cm AV  Area (Vmean):   2.28 cm AV Area (VTI):     2.38 cm AV Vmax:           207.00 cm/s AV Vmean:          156.000 cm/s AV VTI:            0.288 m AV Peak Grad:      17.1 mmHg AV Mean Grad:      11.0 mmHg LVOT Vmax:         200.00 cm/s LVOT Vmean:        140.000 cm/s LVOT VTI:          0.269 m LVOT/AV VTI ratio: 0.93  AORTA Ao Root diam: 3.40 cm TRICUSPID VALVE TR Peak grad:   10.1 mmHg TR Vmax:        159.00 cm/s  SHUNTS Systemic VTI:  0.27 m Systemic Diam: 1.80 cm Skeet Latch MD Electronically signed by Skeet Latch MD Signature Date/Time: 02/12/2022/10:13:15 AM    Final      Medications:     Scheduled Medications:  acetaminophen  1,000 mg Per Tube TID   arformoterol  15 mcg Nebulization BID   budesonide (PULMICORT) nebulizer solution  0.25 mg Nebulization BID   carvedilol  3.125 mg Oral BID WC   Chlorhexidine Gluconate Cloth  6 each Topical Q0600   docusate  100 mg Per Tube BID   FLUoxetine  20 mg Per Tube Daily   free water  300 mL Per Tube Q3H   insulin aspart  0-6 Units Subcutaneous Q4H   isosorbide-hydrALAZINE  0.5 tablet Per Tube TID   mouth rinse  15 mL Mouth Rinse 4 times per day   polyethylene glycol  17 g Per Tube Daily   QUEtiapine  50 mg Per Tube BID  sodium chloride flush  10-40 mL Intracatheter Q12H    Infusions:  sodium chloride 10 mL/hr at 02/12/22 0600   sodium chloride Stopped (02/09/22 2311)   feeding supplement (VITAL 1.5 CAL) 1,000 mL (02/13/22 0334)   heparin 1,250 Units/hr (02/13/22 0320)   meropenem (MERREM) IV 1 g (02/12/22 2237)    PRN Medications: sodium chloride, ipratropium-albuterol, lip balm, mouth rinse, oxyCODONE, phenol, sodium chloride flush    Patient Profile   61 y/o AAF w/ h/o HTN, COPD, anemia, h/o poor med compliance and prior admission to [facility] 9/23 for UTI and pyelonephritis, admitted w/ mixed septic and cardiogenic shock.   Assessment/Plan   1. Shock: Now resolved.  Suspect mixed septic shock and cardiogenic shock.  Lactate  was > 9 with PCT > 150.  E coli bacteremia with echo showing EF 20-25%.  Meropenem for broad spectrum coverage.   2. Acute systolic CHF -> cardiogenic shock: Echo showed EF 20-25% with preservation of apical function but severe hypokinesis of other walls, normal RV, dilated IVC. Unusual wall motion abnormality pattern, prior echo was normal. ?Septic cardiomyopathy with reverse Takotsubo-type pattern.  HS-TnI was minimally elevated with no trend so doubt ACS.  She is now off dobutamine. Creatinine 1.37 today. CVP 2.  - Increase Bidil to 1 tab tid.    - Continue Coreg 3.125 mg bid.  - Seems intravascularly dry with tachycardia, low CVP and worsening creatinine. Will give 250 mL LR - Consider Jardiance tomorrow - Repeat echo yesterday showed EF now 70-75%, G1DD, RV function normal, No MR, trivial TR 3. AKI: In setting of septic shock.  Creatinine 1.37 today with BUN 52.  4. ID: E coli bacteremia.  PCT > 150 initially. WBCs down to 11.7.  Also found to have Enterobacter cloacae in trach aspirate.  Now afebrile.  - Continue meropenem.  5. Anemia: Hgb 87.9 today. Had transfuse 11/18.  - transfuse hgb < 7 6. Thrombocytopenia: Plts now normal. Suspect fall was due to critical illness/sepsis/inflammation. 7. Acute hypoxemic respiratory failure: PNA on CXR, also suspect pulmonary edema. Now on 2L [place]. She has a pre-existing history of COPD.   8. Ischemic digits bilateral hands as well as feet: Palpable brachial pulses, no palpable ulnar or radial pulses.  Ischemic digits with no flow on duplex US at wrists.  Suspect pressor-induced. High risk for auto-amputation.  - Continue heparin gtt  9. Neuro: Seems alert/oriented today, answers questions.  10. Hypernatremia: Na 154>149 today.  Still only getting sips/chips and tube feeds.  - continue free water boluses, increased to 300 cc q3 hrs yesterday - Needs repeat swallow study.   Length of Stay: [address] AGACNP-BC  02/13/2022, 7:32 AM  Advanced  Heart Failure Team Pager [phone] (M-F; 7a - 5p)  Please contact [facility] for night-coverage after hours (5p -7a ) and weekends on [email or web address]  Patient seen with NP, agree with the above note.   No complaints today.  CVP around 2.  Creatinine mildly higher at 1.37.    Repeat echo yesterday showed EF up to 70-75%, hyperdynamic; normal RV.   General: NAD Neck: No JVD, no thyromegaly or thyroid nodule.  Lungs: Clear to auscultation bilaterally with normal respiratory effort. CV: Nondisplaced PMI.  Heart regular S1/S2, no S3/S4, no murmur.  No peripheral edema.   Abdomen: Soft, nontender, no hepatosplenomegaly, no distention.  Skin: Intact without lesions or rashes.  Neurologic: Alert and oriented x 3.  Psych: Normal affect. Extremities: Ischemic digits HEENT: Normal.  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  Repeat echo yesterday showed hyperdynamic LV function and normal RV, recovered.  - Continue Coreg with hyperdynamic function.  - Would stop Bidil and use amlodipine to control BP for ease of use.  - CVP low, agree with gentle IVF.   Na improved with free water. Still only getting sips clears, needs MBS.   Loralie Champagne 02/13/2022 9:28 AM

## 2022-02-13 NOTE — Progress Notes (Signed)
ANTICOAGULATION CONSULT NOTE - Follow Up Consult  Pharmacy Consult for Heparin Indication: ischemic digits  No Active Allergies  Patient Measurements: Height: 5\' 4"  (162.6 cm) Weight: 59.2 kg (130 lb 8.2 oz) IBW/kg (Calculated) : 54.7 Heparin Dosing Weight: 48 kg  Vital Signs: Temp: 100.1 F (37.8 C) (11/21 0320) Temp Source: Oral (11/21 0320) BP: 136/70 (11/21 0320) Pulse Rate: 108 (11/21 0735)  Labs: Recent Labs    02/11/22 0440 02/12/22 0420 02/13/22 0340  HGB 8.7* 8.1* 7.9*  HCT 25.5* 23.9* 23.5*  PLT 227 243 278  HEPARINUNFRC 0.33 0.46 0.42  CREATININE 1.38* 1.31* 1.37*     Estimated Creatinine Clearance: 36.3 mL/min (A) (by C-G formula based on SCr of 1.37 mg/dL (H)).   Medical History: Past Medical History:  Diagnosis Date   Bronchitis    COPD (chronic obstructive pulmonary disease) (HCC)    Hypertension     Assessment: 61 yo female presented on 11/6 with AMS found to have e coli bacteremia and mixed cardiogenic/septic shock. Course complicated by ischemic fingers and vascular consulted but unable to use IV heparin due to thrombocytopenia. Noted dark tarry stools and noted somewhat bloody smears on 11/13 per RN. Pharmacy consulted to dose heparin with low goal and no bolus now that plts >50.  Heparin level remains therapeutic at 0.42, on heparin infusion at 1250 units/hr. Hgb 7.9, plt 278 (stable). No s/sx of bleeding or infusion issues.   Goal of Therapy:  Heparin level 0.3-0.5 units/ml Monitor platelets by anticoagulation protocol: Yes   Plan:  Continue heparin 1250 units/hr Monitor daily heparin level and CBC  12/13, PharmD, BCCCP Clinical Pharmacist  Phone: [phone] 02/13/2022 7:55 AM  Please check AMION for all [facility] phone numbers After 10:00 PM, call Main Pharmacy [phone]

## 2022-02-13 NOTE — Progress Notes (Signed)
SLP Cancellation Note  Patient Details Name: Nicole Cunningham MRN: [id number] DOB: 07-05-1960   Cancelled treatment:    MBS was unable to be completed due to scheduling conflicts in radiology. Will continue efforts. D/W RN.  Trenden Hazelrigg L. Samson Frederic, MA CCC/SLP Clinical Specialist - Acute Care SLP Acute Rehabilitation Services Office number [phone]        Nicole Cunningham 02/13/2022, 4:36 PM

## 2022-02-13 NOTE — Progress Notes (Signed)
Speech Language Pathology Treatment: Dysphagia  Patient Details Name: Nicole Cunningham MRN: [id number] DOB: 1960-12-07 Today's Date: 02/13/2022 Time: 2947-6546 SLP Time Calculation (min) (ACUTE ONLY): 11 min  Assessment / Plan / Recommendation Clinical Impression  F/u to determine if pt is appropriate for MBS today. She is more interactive this am.  RR is improved from yesterday. Accepted several ice chips with subjectively adequate toleration, no overt indications of aspiration. Min cues for effortful swallow. Had some nausea this am, so TF are on hold, but pt would like to proceed with swallow study. D/W Dr. Jacqulyn Bath and RN.  Scheduled for 1030 today.    HPI HPI: Patient is a 61 year old female who presented to [facility] ED on 11/6 with AMS, UTI and pyelonephritis, cardiogenic and septic shock, AKI, acute hypoxic respiratory failure due to multifocal pna caused by ESBL enterobacter, L parapneumonic effusion, bilateral finger dry gangrene.  ETT 11/6-11/16. PMH of COPD, anemia, HTN. Failed Yale swallow screen after extubation.      SLP Plan  Continue with current plan of care      Recommendations for follow up therapy are one component of a multi-disciplinary discharge planning process, led by the attending physician.  Recommendations may be updated based on patient status, additional functional criteria and insurance authorization.    Recommendations  Diet recommendations: NPO Medication Administration: Via alternative means                Oral Care Recommendations: Oral care QID SLP Visit Diagnosis: Dysphagia, oropharyngeal phase (R13.12) Plan: Continue with current plan of care         Nicole Cunningham L. Samson Frederic, MA CCC/SLP Clinical Specialist - Acute Care SLP Acute Rehabilitation Services Office number [phone]   Nicole Cunningham  02/13/2022, 9:33 AM

## 2022-02-14 ENCOUNTER — Inpatient Hospital Stay (HOSPITAL_COMMUNITY): Payer: Self-pay

## 2022-02-14 LAB — CBC
HCT: 23.2 % — ABNORMAL LOW (ref 36.0–46.0)
Hemoglobin: 7.6 g/dL — ABNORMAL LOW (ref 12.0–15.0)
MCH: 26.8 pg (ref 26.0–34.0)
MCHC: 32.8 g/dL (ref 30.0–36.0)
MCV: 81.7 fL (ref 80.0–100.0)
Platelets: 281 10*3/uL (ref 150–400)
RBC: 2.84 MIL/uL — ABNORMAL LOW (ref 3.87–5.11)
WBC: 9.3 10*3/uL (ref 4.0–10.5)
nRBC: 0 % (ref 0.0–0.2)

## 2022-02-14 LAB — BASIC METABOLIC PANEL
Anion gap: 7 (ref 5–15)
BUN: 49 mg/dL — ABNORMAL HIGH (ref 8–23)
CO2: 15 mmol/L — ABNORMAL LOW (ref 22–32)
Calcium: 7.8 mg/dL — ABNORMAL LOW (ref 8.9–10.3)
Chloride: 122 mmol/L — ABNORMAL HIGH (ref 98–111)
Creatinine, Ser: 1.39 mg/dL — ABNORMAL HIGH (ref 0.44–1.00)
GFR, Estimated: 43 mL/min — ABNORMAL LOW (ref >60)
Glucose, Bld: 124 mg/dL — ABNORMAL HIGH (ref 70–99)
Potassium: 3.9 mmol/L (ref 3.5–5.1)
Sodium: 144 mmol/L (ref 135–145)

## 2022-02-14 LAB — GLUCOSE, CAPILLARY
Glucose-Capillary: 100 mg/dL — ABNORMAL HIGH (ref 70–99)
Glucose-Capillary: 104 mg/dL — ABNORMAL HIGH (ref 70–99)
Glucose-Capillary: 120 mg/dL — ABNORMAL HIGH (ref 70–99)
Glucose-Capillary: 130 mg/dL — ABNORMAL HIGH (ref 70–99)
Glucose-Capillary: 89 mg/dL (ref 70–99)
Glucose-Capillary: 95 mg/dL (ref 70–99)
Glucose-Capillary: 98 mg/dL (ref 70–99)

## 2022-02-14 LAB — HEPARIN LEVEL (UNFRACTIONATED): Heparin Unfractionated: 0.43 IU/mL (ref 0.30–0.70)

## 2022-02-14 MED ORDER — LACTATED RINGERS IV BOLUS
500.0000 mL | Freq: Once | INTRAVENOUS | Status: AC
  Administered 2022-02-14: 500 mL via INTRAVENOUS

## 2022-02-14 MED ORDER — FREE WATER
300.0000 mL | Status: DC
  Administered 2022-02-14 – 2022-02-15 (×6): 300 mL

## 2022-02-14 NOTE — Progress Notes (Signed)
PROGRESS NOTE    Nicole Cunningham  [id number] DOB: 18-Jan-1961 DOA: 01/28/2022 PCP: [name], MD   Brief Narrative:  Patient is a 77 old female with history of COPD, anemia, hypertension who was brought to the emergency department on 11/6 with altered mental status, shortness of breath.  On presentation, lab work showed creatinine of 4.1, lactic acid of 9.  CT head, chest x-ray did not show any acute findings.  CT abdomen/pelvis showed 17 mm nonobstructing renal calculus without hydronephrosis.  Patient was admitted for the management of septic shock under ICU service.  Patient had a prolonged hospital course, found to have E. coli bacteremia, echo showed EF of 20 to 25% for which cardiology consulted.  Patient was managed for cardiogenic/septic shock.  Transferred to [facility] service on 11/18.   Significant events as per PCCM :   11/6 septic shock on levo , ETT >> 11/6 Echo EF 20 to 25% global hypokinesis, apex intact 11/6 RIJ HD cath >> dobutamine at 5 mics 11/7>>Dobutamine increased to 7.5 mics , 1 unit PRBC and 1 unit platelet transfusion 11/8 dobutamine increased to 10 mics 11/10 dobut wean, diuretic, pigtail  11/13 restarted levophed overnight , Short run of SVT, weaned Dobutamine to 3 , repleted K, 1 Unit PRBC per cards ( HGB 7.2)  Assessment & Plan:   Principal Problem:   Septic shock ([place]) Active Problems:   Sepsis ([place])   Acute encephalopathy   Pressure injury of skin  Shock: Mixed cardiogenic and septic shock.  Suspicion for sepsis cardiomyopathy.  Heart failure team following, plan for cardiac cath when appropriate.  Dobutamine stopped.  Off vasopressors. Remains in sinus tachycardia with stable blood pressure.  Monitor on telemetry.  Repeat echo shows 70% ejection fraction/hyperdynamic left ventricular.  No plans for cardiac cath.  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   Bacteremia: Blood culture/urine culture showed E. coli.  Patient completed 10 days of Merrem  antibiotics.   AKI: Likely from ATN from shock.  Renal function has stabilized.  She is getting some IV fluids by cardiology today.   Pancytopenia/microcytic anemia.: Thought to be sepsis related.  She has thus far received 4 units of PRBC transfusion and 2 units of platelet transfusion during this hospitalization.  Hemoglobin dropped to the range of 6 and she received 1 unit of PRBC transfusion on 02/11/2022.  Hemoglobin stable since then.  Monitor CBC daily.   Acute hypoxic respiratory failure secondary to multifocal pneumonia: History of COPD.  Currently on Brovana, Pulmicort, DuoNeb. currently on 2 L of oxygen via nasal cannula.  Tracheal aspirate has shown Enterobacter cloacae.  Continue current antibiotics. Patient was also found to have left parapneumonic effusion.  Status post pigtail placement, now has been removed.  Extubated on 11/16 with no intention to intubate.   Acute systolic CHF: Heart failure team following.  Echo showed EF of 20 to 25% with severe hypokinesis, normal RV, dilated IVC.  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  She is getting some IV fluids again today.  Cardiology managing.   Septic encephalopathy:   CT head on presentation did not show any acute findings.  Currently unable to swallow, speech therapy following and recommending n.p.o.  Currently on tube feeding.  Speech therapy should continue to follow and evaluate her swallowing so that the feeding can be discontinued.  Started on Seroquel for agitation by PCCM team.  Might need to be  titrated. Mental status has significantly improved and she is currently alert and oriented.  Follows commands.     Upper extremity ischemia with dry gangrene: Present on admission.  Worse on the right side.  Also suspected to have worsening from pressures causing vaso constriction.Vascular surgery following.  Currently on low-dose  heparin.  No plan for intervention.  Rx for autoamputation.  Vascular surgery following.  Per them, her hands are nonsalvageable and patient may need evaluation by hand surgery at some point in time however patient carries very poor prognosis with multiple comorbidities.   Hypokalemia/hypernatremia: Continue to monitor electrolytes.  Some improvement in sodium.  Continue free water boluses for hypernatremia.  Potassium normal.   Nonobstructing ureteral stone: We recommend follow-up with urology as an outpatient.  Thrombocytopenia: Likely sepsis driven.  Improved.   Goals of care/deconditioning/debility: Palliative care closely following.  Currently DNR.  Needs to continue discussing goals of care.  PT/OT recommending skilled facility on discharge.  After discussion with family, they are agreeable for comfort care if she declines further.   DVT prophylaxis: Place and maintain sequential compression device Start: 01/29/22 1021   Code Status: DNR  Family Communication:  None present at bedside.  Status is: Inpatient Remains inpatient appropriate because: Patient very sick.     Estimated body mass index is 21.99 kg/m as calculated from the following:   Height as of this encounter: 5\' 4"  (1.626 m).   Weight as of this encounter: 58.1 kg.  Pressure Injury 02/08/22 Buttocks Right Stage 2 -  Partial thickness loss of dermis presenting as a shallow open injury with a red, pink wound bed without slough. (Active)  02/08/22 0800  Location: Buttocks  Location Orientation: Right  Staging: Stage 2 -  Partial thickness loss of dermis presenting as a shallow open injury with a red, pink wound bed without slough.  Wound Description (Comments):   Present on Admission:   Dressing Type Foam - Lift dressing to assess site every shift 02/14/22 0714   Nutritional Assessment: Body mass index is 21.99 kg/m.Marland Kitchen Seen by dietician.  I agree with the assessment and plan as outlined below: Nutrition  Status: Nutrition Problem: Severe Malnutrition Etiology: chronic illness (COPD) Signs/Symptoms: severe muscle depletion, severe fat depletion Interventions: Tube feeding  . Skin Assessment: I have examined the patient's skin and I agree with the wound assessment as performed by the wound care RN as outlined below: Pressure Injury 02/08/22 Buttocks Right Stage 2 -  Partial thickness loss of dermis presenting as a shallow open injury with a red, pink wound bed without slough. (Active)  02/08/22 0800  Location: Buttocks  Location Orientation: Right  Staging: Stage 2 -  Partial thickness loss of dermis presenting as a shallow open injury with a red, pink wound bed without slough.  Wound Description (Comments):   Present on Admission:   Dressing Type Foam - Lift dressing to assess site every shift 02/14/22 0714    Consultants:  Cardiology Vascular surgery Palliative care Procedures:  As above  Antimicrobials:  Anti-infectives (From admission, onward)    Start     Dose/Rate Route Frequency Ordered Stop   02/04/22 1345  meropenem (MERREM) 1 g in sodium chloride 0.9 % 100 mL IVPB        1 g 200 mL/hr over 30 Minutes Intravenous Every 12 hours 02/04/22 1258 02/13/22 2350   02/01/22 2200  ceFAZolin (ANCEF) IVPB 2g/100 mL premix  Status:  Discontinued        2 g 200  mL/hr over 30 Minutes Intravenous Every 12 hours 02/01/22 0754 02/04/22 1258   01/29/22 2200  ceFEPIme (MAXIPIME) 1 g in sodium chloride 0.9 % 100 mL IVPB  Status:  Discontinued        1 g 200 mL/hr over 30 Minutes Intravenous Every 24 hours 01/29/22 0046 01/29/22 0242   01/29/22 2200  cefTRIAXone (ROCEPHIN) 2 g in sodium chloride 0.9 % 100 mL IVPB  Status:  Discontinued        2 g 200 mL/hr over 30 Minutes Intravenous Every 24 hours 01/29/22 0242 02/01/22 0754   01/29/22 0048  vancomycin variable dose per unstable renal function (pharmacist dosing)  Status:  Discontinued         Does not apply See admin instructions  01/29/22 0048 01/29/22 1343   01/28/22 2230  ceFEPIme (MAXIPIME) 2 g in sodium chloride 0.9 % 100 mL IVPB        2 g 200 mL/hr over 30 Minutes Intravenous  Once 01/28/22 2228 01/28/22 2350   01/28/22 2230  metroNIDAZOLE (FLAGYL) IVPB 500 mg        500 mg 100 mL/hr over 60 Minutes Intravenous  Once 01/28/22 2228 01/29/22 0021   01/28/22 2230  vancomycin (VANCOCIN) IVPB 1000 mg/200 mL premix        1,000 mg 200 mL/hr over 60 Minutes Intravenous  Once 01/28/22 2228 01/29/22 0150         Subjective:  Patient seen and examined.  Complains of not being able to sleep and bilateral hand pain.  No other complaint.  Appears alert and oriented.  Has a very weak voice.  Objective: Vitals:   02/14/22 0359 02/14/22 0805 02/14/22 0813 02/14/22 1016  BP: 125/61 (!) 110/56  130/76  Pulse: (!) 105 98    Resp: (!) 24 (!) 26    Temp: 98.4 F (36.9 C) 97.7 F (36.5 C)    TempSrc: Oral Oral    SpO2: 100% 100% 100%   Weight: 58.1 kg     Height:        Intake/Output Summary (Last 24 hours) at 02/14/2022 1035 Last data filed at 02/14/2022 0359 Gross per 24 hour  Intake 4017.62 ml  Output --  Net 4017.62 ml    Filed Weights   02/12/22 0300 02/13/22 0320 02/14/22 0359  Weight: 54.8 kg 59.2 kg 58.1 kg    Examination:  General exam: Appears calm and comfortable  Respiratory system: Clear to auscultation. Respiratory effort normal. Cardiovascular system: S1 & S2 heard, RRR. No JVD, murmurs, rubs, gallops or clicks.  +3 pitting edema bilateral lower extremity, +2 pitting edema bilateral upper extremities. Gastrointestinal system: Abdomen is nondistended, soft and nontender. No organomegaly or masses felt. Normal bowel sounds heard. Central nervous system: Alert and oriented. No focal neurological deficits. Extremities: Symmetric 5 x 5 power.  Gangrenous fingers both hands Skin: No rashes, lesions or ulcers.   Data Reviewed: I have personally reviewed following labs and imaging  studies  CBC: Recent Labs  Lab 02/10/22 0534 02/11/22 0440 02/12/22 0420 02/13/22 0340 02/14/22 0410  WBC 16.5* 14.1* 14.2* 11.7* 9.3  HGB 6.8* 8.7* 8.1* 7.9* 7.6*  HCT 20.5* 25.5* 23.9* 23.5* 23.2*  MCV 74.0* 76.6* 78.4* 79.1* 81.7  PLT 171 227 243 278 [id number]    Basic Metabolic Panel: Recent Labs  Lab 02/10/22 0534 02/11/22 0440 02/12/22 0420 02/13/22 0340 02/14/22 0410  NA 149* 152* 154* 149* 144  K 3.6 3.6 3.7 3.6 3.9  CL 123* 130* 126* 124* 122*  CO2 17* 16* 16* 14* 15*  GLUCOSE 140* 151* 151* 138* 124*  BUN 51* 50* 51* 52* 49*  CREATININE 1.47* 1.38* 1.31* 1.37* 1.39*  CALCIUM 8.2* 8.2* 8.4* 8.3* 7.8*    GFR: Estimated Creatinine Clearance: 35.8 mL/min (A) (by C-G formula based on SCr of 1.39 mg/dL (H)). Liver Function Tests: No results for input(s): "AST", "ALT", "ALKPHOS", "BILITOT", "PROT", "ALBUMIN" in the last 168 hours.  No results for input(s): "LIPASE", "AMYLASE" in the last 168 hours. No results for input(s): "AMMONIA" in the last 168 hours. Coagulation Profile: No results for input(s): "INR", "PROTIME" in the last 168 hours. Cardiac Enzymes: No results for input(s): "CKTOTAL", "CKMB", "CKMBINDEX", "TROPONINI" in the last 168 hours. BNP (last 3 results) No results for input(s): "PROBNP" in the last 8760 hours. HbA1C: No results for input(s): "HGBA1C" in the last 72 hours. CBG: Recent Labs  Lab 02/13/22 1603 02/13/22 1930 02/13/22 2349 02/14/22 0421 02/14/22 0811  GLUCAP 84 109* 120* 100* 130*    Lipid Profile: No results for input(s): "CHOL", "HDL", "LDLCALC", "TRIG", "CHOLHDL", "LDLDIRECT" in the last 72 hours. Thyroid Function Tests: No results for input(s): "TSH", "T4TOTAL", "FREET4", "T3FREE", "THYROIDAB" in the last 72 hours. Anemia Panel: No results for input(s): "VITAMINB12", "FOLATE", "FERRITIN", "TIBC", "IRON", "RETICCTPCT" in the last 72 hours. Sepsis Labs: No results for input(s): "PROCALCITON", "LATICACIDVEN" in the last 168  hours.  Recent Results (from the past 240 hour(s))  Culture, blood (Routine X 2) w Reflex to ID Panel     Status: None   Collection Time: 02/05/22 11:38 AM   Specimen: BLOOD  Result Value Ref Range Status   Specimen Description BLOOD PICC LINE  Final   Special Requests   Final    BOTTLES DRAWN AEROBIC AND ANAEROBIC Blood Culture adequate volume   Culture   Final    NO GROWTH 5 DAYS Performed at [facility], [address], [place], [place] [address]    Report Status 02/10/2022 FINAL  Final  Culture, blood (Routine X 2) w Reflex to ID Panel     Status: None   Collection Time: 02/05/22 11:38 AM   Specimen: BLOOD  Result Value Ref Range Status   Specimen Description BLOOD PICC LINE  Final   Special Requests   Final    BOTTLES DRAWN AEROBIC AND ANAEROBIC Blood Culture adequate volume   Culture   Final    NO GROWTH 5 DAYS Performed at [facility], [address] [place], [place] [address]    Report Status 02/10/2022 FINAL  Final  Culture, Respiratory w Gram Stain     Status: None   Collection Time: 02/06/22 10:56 AM   Specimen: Tracheal Aspirate; Respiratory  Result Value Ref Range Status   Specimen Description TRACHEAL ASPIRATE  Final   Special Requests NONE  Final   Gram Stain   Final    NO WBC SEEN NO ORGANISMS SEEN Performed at [facility], [address] [place], [place] [address]    Culture MODERATE ENTEROBACTER CLOACAE  Final   Report Status 02/08/2022 FINAL  Final   Organism ID, Bacteria ENTEROBACTER CLOACAE  Final      Susceptibility   Enterobacter cloacae - MIC*    CEFAZOLIN >=64 RESISTANT Resistant     CEFEPIME 2 SENSITIVE Sensitive     CEFTAZIDIME >=64 RESISTANT Resistant     CIPROFLOXACIN <=0.25 SENSITIVE Sensitive     GENTAMICIN <=1 SENSITIVE Sensitive     IMIPENEM <=0.25 SENSITIVE Sensitive  TRIMETH/SULFA <=20 SENSITIVE Sensitive     PIP/TAZO >=128 RESISTANT Resistant     * MODERATE ENTEROBACTER CLOACAE     Radiology  Studies: No results found.  Scheduled Meds:  acetaminophen  1,000 mg Per Tube TID   amLODipine  5 mg Oral Daily   arformoterol  15 mcg Nebulization BID   budesonide (PULMICORT) nebulizer solution  0.25 mg Nebulization BID   carvedilol  3.125 mg Oral BID WC   Chlorhexidine Gluconate Cloth  6 each Topical Q0600   docusate  100 mg Per Tube BID   FLUoxetine  20 mg Per Tube Daily   free water  300 mL Per Tube Q4H   insulin aspart  0-6 Units Subcutaneous Q4H   mouth rinse  15 mL Mouth Rinse 4 times per day   polyethylene glycol  17 g Per Tube Daily   QUEtiapine  50 mg Per Tube BID   sodium chloride flush  10-40 mL Intracatheter Q12H   Continuous Infusions:  sodium chloride 10 mL/hr at 02/12/22 0600   sodium chloride Stopped (02/09/22 2311)   feeding supplement (VITAL 1.5 CAL) 1,000 mL (02/13/22 1633)   heparin 1,250 Units/hr (02/14/22 0359)     LOS: 16 days   Darliss Cheney, MD [facility]  02/14/2022, 10:35 AM   *Please note that this is a verbal dictation therefore any spelling or grammatical errors are due to the "[place] [employer]" system interpretation.  Please page via [place] and do not message via secure chat for urgent patient care matters. Secure chat can be used for non urgent patient care matters.  How to contact the [facility] Attending or Consulting provider [place] or covering provider during after hours [place], for this patient?  Check the care team in [facility] and look for a) attending/consulting [facility] provider listed and b) the [facility] team listed. Page or secure chat [phone]. Log into [email or web address] and use [place]'s universal password to access. If you do not have the password, please contact the hospital operator. Locate the [facility] provider you are looking for under [facility] and page to a number that you can be directly reached. If you still have difficulty reaching the provider, please page the [facility] (Director on Call) for the Hospitalists listed on amion for  assistance.

## 2022-02-14 NOTE — Evaluation (Signed)
Occupational Therapy Evaluation Patient Details Name: Nicole Cunningham MRN: [id number] DOB: Sep 06, 1960 Today's Date: 02/14/2022   History of Present Illness 61 y.o. female presents to [facility] hospital on 01/29/2022 with AMS and SOB. Pt admitted with shock, AKI. CT abdomen pelvis 17 mm nonobstructive renal calculus without hydronephrosis. Pt intubated on 11/6. Pt developed acute systolic HF. Bronchoscopy 11/14 for worsening PNA. extubated 11/16. PMH includes COPD and HTN.   Clinical Impression   Nicole Cunningham was evaluated s/p the above admission list, per chart she is typically mod I at baseline. Upon evaluation pt was limited by weakness, impaired communication and cognition, neurotic extremities, poor balance and activity tolerance. Overall she requires total A +2 for all aspects of her care. Pt was dependently lifted from bed>chair this date with maximove.  Pt will likely benefit from BUE wrist cockup splints for joint integrity and skin protection as well as adaptive equipment for self-feeding/grooming. OT to continue to follow acutely. Recommend d/c to SNF.     Recommendations for follow up therapy are one component of a multi-disciplinary discharge planning process, led by the attending physician.  Recommendations may be updated based on patient status, additional functional criteria and insurance authorization.   Follow Up Recommendations  Skilled nursing-short term rehab (<3 hours/day)        Patient can return home with the following A lot of help with walking and/or transfers;A lot of help with bathing/dressing/bathroom;Direct supervision/assist for financial management;Direct supervision/assist for medications management;Assist for transportation;Help with stairs or ramp for entrance;Assistance with cooking/housework;Assistance with feeding    Functional Status Assessment  Patient has had a recent decline in their functional status and/or demonstrates limited ability to make significant  improvements in function in a reasonable and predictable amount of time  Equipment Recommendations  Other (comment) (defer)    Recommendations for Other Services       Precautions / Restrictions Precautions Precautions: Fall;Other (comment) Precaution Comments: necrotic digits in all extremities Restrictions Weight Bearing Restrictions: No      Mobility Bed Mobility Overal bed mobility: Needs Assistance Bed Mobility: Rolling Rolling: Total assist, +2 for physical assistance, +2 for safety/equipment         General bed mobility comments: able to intiate BLE and BUE movement with cues and increased time. Ultimatelty total A for rolling    Transfers Overall transfer level: Needs assistance Equipment used: Ambulation equipment used Transfers: Bed to chair/wheelchair/BSC             General transfer comment: total A Transfer via Lift Equipment: Maximove    Balance Overall balance assessment: Needs assistance Sitting-balance support: Bilateral upper extremity supported, Feet supported Sitting balance-[name]: Poor Sitting balance - Comments: supported in chair                                   ADL either performed or assessed with clinical judgement   ADL Overall ADL's : Needs assistance/impaired Eating/Feeding: Total assistance Eating/Feeding Details (indicate cue type and reason): may benefit from adapted utencils                                   General ADL Comments: total A for all tasks due to bilat hand neuropathy, weakness, impiared cognition     Vision Baseline Vision/History: 0 No visual deficits Vision Assessment?: Vision impaired- to be further tested in functional context Additional  Comments: did not assess     Perception Perception Perception Tested?: No   Praxis Praxis Praxis tested?: Not tested    Pertinent Vitals/Pain Pain Assessment Pain Assessment: Faces Faces Pain Scale: Hurts a little bit Pain  Location: BUEs with movement Pain Descriptors / Indicators: Grimacing Pain Intervention(s): Monitored during session     Hand Dominance Right   Extremity/Trunk Assessment Upper Extremity Assessment Upper Extremity Assessment: RUE deficits/detail;LUE deficits/detail RUE Deficits / Details: neurotic digits, muscle wasteing in hands. no active movement from wrist distally. minimal activiation at elbow and shoulder RUE Sensation: decreased light touch;decreased proprioception;history of peripheral neuropathy RUE Coordination: decreased fine motor;decreased gross motor LUE Deficits / Details: neurotic digits, muscle wasteing in hands. no active movement from wrist distally. minimal activiation at elbow and shoulder LUE Sensation: decreased light touch;decreased proprioception LUE Coordination: decreased fine motor;decreased gross motor   Lower Extremity Assessment Lower Extremity Assessment: Defer to PT evaluation   Cervical / Trunk Assessment Cervical / Trunk Assessment: Kyphotic   Communication Communication Communication: Other (comment)   Cognition Arousal/Alertness: Awake/alert Behavior During Therapy: Flat affect Overall Cognitive Status: Difficult to assess                                 General Comments: seemingly slow processing, not many attempts of verbalizing. soft spoken. intiates most tasks after command     General Comments  VSS on 2L [place].    Exercises     Shoulder Instructions      Home Living Family/patient expects to be discharged to:: Other (Comment) Living Arrangements: Spouse/significant other;Children Available Help at Discharge: Family;Available PRN/intermittently Type of Home: Other(Comment) Home Access: Level entry     Home Layout: One level     Bathroom Shower/Tub: Tub/shower unit         Home Equipment: None   Additional Comments: Hotel set up      Prior Functioning/Environment Prior Level of Function :  Independent/Modified Independent;History of Falls (last six months)             Mobility Comments: fell x 1 in BR          OT Problem List: Decreased strength;Impaired balance (sitting and/or standing);Decreased range of motion;Decreased activity tolerance;Decreased safety awareness;Decreased knowledge of use of DME or AE;Decreased knowledge of precautions;Pain;Impaired UE functional use      OT Treatment/Interventions: Self-care/ADL training;Therapeutic exercise;DME and/or AE instruction;Therapeutic activities;Patient/family education;Balance training    OT Goals(Current goals can be found in the care plan section) Acute Rehab OT Goals Patient Stated Goal: did not sate OT Goal Formulation: With patient Time For Goal Achievement: 02/28/22 Potential to Achieve Goals: Good ADL Goals Pt Will Perform Eating: with mod assist;with adaptive utensils;bed level;with assist to don/doff brace/orthosis Pt Will Perform Grooming: with mod assist;with adaptive equipment;bed level Pt/caregiver will Perform Home Exercise Program: Increased ROM;Increased strength;Both right and left upper extremity;With minimal assist;With written HEP provided Additional ADL Goal #1: Pt will tolerate unsupported sitting with min G x10 minutes as a precursor to ADLs  OT Frequency: Min 2X/week    Co-evaluation PT/OT/SLP Co-Evaluation/Treatment: Yes Reason for Co-Treatment: Complexity of the patient's impairments (multi-system involvement);For patient/therapist safety;To address functional/ADL transfers   OT goals addressed during session: ADL's and self-care      AM-PAC OT "6 Clicks" Daily Activity     Outcome Measure Help from another person eating meals?: Total Help from another person taking care of personal grooming?: Total Help from another  person toileting, which includes using toliet, bedpan, or urinal?: Total Help from another person bathing (including washing, rinsing, drying)?: Total Help from  another person to put on and taking off regular upper body clothing?: Total   6 Click Score: 5   End of Session Equipment Utilized During Treatment:  Genia Plants) Nurse Communication: Mobility status (maximove, geomat)  Activity Tolerance: Patient tolerated treatment well Patient left: in chair;with call bell/phone within reach;with chair alarm set  OT Visit Diagnosis: Unsteadiness on feet (R26.81);Muscle weakness (generalized) (M62.81);Other abnormalities of gait and mobility (R26.89);History of falling (Z91.81);Pain;Adult, failure to thrive (R62.7)                Time: 9024-0973 OT Time Calculation (min): 31 min Charges:  OT General Charges $OT Visit: 1 Visit OT Evaluation $OT Eval Moderate Complexity: 1 Mod    Celester Lech D Causey 02/14/2022, 9:49 AM

## 2022-02-14 NOTE — Progress Notes (Signed)
ANTICOAGULATION CONSULT NOTE - Follow Up Consult  Pharmacy Consult for Heparin Indication: ischemic digits  No Active Allergies  Patient Measurements: Height: 5\' 4"  (162.6 cm) Weight: 58.1 kg (128 lb 1.4 oz) IBW/kg (Calculated) : 54.7 Heparin Dosing Weight: 48 kg  Vital Signs: Temp: 98.4 F (36.9 C) (11/22 0359) Temp Source: Oral (11/22 0359) BP: 125/61 (11/22 0359) Pulse Rate: 105 (11/22 0359)  Labs: Recent Labs    02/12/22 0420 02/13/22 0340 02/14/22 0410  HGB 8.1* 7.9*  --   HCT 23.9* 23.5*  --   PLT 243 278  --   HEPARINUNFRC 0.46 0.42 0.43  CREATININE 1.31* 1.37* 1.39*     Estimated Creatinine Clearance: 35.8 mL/min (A) (by C-G formula based on SCr of 1.39 mg/dL (H)).   Medical History: Past Medical History:  Diagnosis Date   Bronchitis    COPD (chronic obstructive pulmonary disease) (HCC)    Hypertension     Assessment: 61 yo female presented on 11/6 with AMS found to have e coli bacteremia and mixed cardiogenic/septic shock. Course complicated by ischemic fingers and vascular consulted but unable to use IV heparin due to thrombocytopenia. Noted dark tarry stools and noted somewhat bloody smears on 11/13 per RN. Pharmacy consulted to dose heparin with low goal and no bolus now that plts >50.  Heparin level remains therapeutic at 0.43, on heparin infusion at 1250 units/hr. Hgb 7.6, plt 281 (stable). No s/sx of bleeding or infusion issues.   Goal of Therapy:  Heparin level 0.3-0.5 units/ml Monitor platelets by anticoagulation protocol: Yes   Plan:  Continue heparin 1250 units/hr Monitor daily heparin level and CBC  12/13, PharmD, BCCCP Clinical Pharmacist  Phone: [phone] 02/14/2022 7:15 AM  Please check AMION for all [facility] Pharmacy phone numbers After 10:00 PM, call Main Pharmacy [phone]

## 2022-02-14 NOTE — Progress Notes (Addendum)
Patient ID: Nicole Cunningham, female   DOB: 07-30-1960, 61 y.o.   MRN: [id number]   Advanced Heart Failure Rounding Note  PCP-Cardiologist: None   Subjective:   11/16 Extubated. DBA stopped.  Bronchoscopy performed 11/14 for worsening PNA>>Copious amount of tenacious secretions noted in the RM/RL/LLL, suctioned and BAL was performed. Cultures + for Enterobacter Cloacae.  She remains on meropenem. WBCs 14.2 today.   On heparin gtt. Platelets now normal. She had 1 unit PRBCs on 11/18.   Na improving [phone] with increased FWF. She is NPO except for sips and is getting tube feeds.   SCr 1.7>1.5>1.31>1.37>1.39  CVP ~0/negatives today  Repeat echo 11/20 showed EF now 70-75%, G1DD, RV function normal, No MR, trivial TR  She is alert/oriented, able to answer questions. Only complaining of pain in extremeties. Denies CP and SOB.   Objective:   Weight Range: 58.1 kg Body mass index is 21.99 kg/m.   Vital Signs:   Temp:  [97.6 F (36.4 C)-98.4 F (36.9 C)] 97.7 F (36.5 C) (11/22 0805) Pulse Rate:  [69-105] 98 (11/22 0805) Resp:  [23-33] 26 (11/22 0805) BP: (91-141)/(43-69) 110/56 (11/22 0805) SpO2:  [97 %-100 %] 100 % (11/22 0813) Weight:  [58.1 kg] 58.1 kg (11/22 0359) Last BM Date : 02/13/22  Weight change: Filed Weights   02/12/22 0300 02/13/22 0320 02/14/22 0359  Weight: 54.8 kg 59.2 kg 58.1 kg    Intake/Output:   Intake/Output Summary (Last 24 hours) at 02/14/2022 0825 Last data filed at 02/14/2022 0359 Gross per 24 hour  Intake 4017.62 ml  Output --  Net 4017.62 ml      Physical Exam  CVP ~0 General:  cachectic appearing.  No respiratory difficulty HEENT: +cortrak Neck: supple. No JVD. Carotids 2+ bilat; no bruits. No lymphadenopathy or thyromegaly appreciated. Cor: PMI nondisplaced. Regular rate & rhythm. No rubs, gallops or murmurs. Lungs: clear Abdomen: soft, nontender, nondistended. No hepatosplenomegaly. No bruits or masses. Good bowel  sounds. Extremities: no clubbing, rash, non-pittng BLE edema, +2 BUE edema. Ischemic digits Neuro: alert & oriented x 3, cranial nerves grossly intact. moves all 4 extremities  Affect pleasant.   Telemetry   NSR 80s (Personally reviewed)    Labs    CBC Recent Labs    02/12/22 0420 02/13/22 0340  WBC 14.2* 11.7*  HGB 8.1* 7.9*  HCT 23.9* 23.5*  MCV 78.4* 79.1*  PLT 243 361   Basic Metabolic Panel Recent Labs    02/13/22 0340 02/14/22 0410  NA 149* 144  K 3.6 3.9  CL 124* 122*  CO2 14* 15*  GLUCOSE 138* 124*  BUN 52* 49*  CREATININE 1.37* 1.39*  CALCIUM 8.3* 7.8*   Hemoglobin A1C No results for input(s): "HGBA1C" in the last 72 hours. Imaging    No results found.   Medications:     Scheduled Medications:  acetaminophen  1,000 mg Per Tube TID   amLODipine  5 mg Oral Daily   arformoterol  15 mcg Nebulization BID   budesonide (PULMICORT) nebulizer solution  0.25 mg Nebulization BID   carvedilol  3.125 mg Oral BID WC   Chlorhexidine Gluconate Cloth  6 each Topical Q0600   docusate  100 mg Per Tube BID   FLUoxetine  20 mg Per Tube Daily   free water  300 mL Per Tube Q3H   insulin aspart  0-6 Units Subcutaneous Q4H   mouth rinse  15 mL Mouth Rinse 4 times per day   polyethylene glycol  17 g  Per Tube Daily   QUEtiapine  50 mg Per Tube BID   sodium chloride flush  10-40 mL Intracatheter Q12H    Infusions:  sodium chloride 10 mL/hr at 02/12/22 0600   sodium chloride Stopped (02/09/22 2311)   feeding supplement (VITAL 1.5 CAL) 1,000 mL (02/13/22 1633)   heparin 1,250 Units/hr (02/14/22 0359)    PRN Medications: sodium chloride, ipratropium-albuterol, lip balm, loperamide, ondansetron (ZOFRAN) IV, mouth rinse, oxyCODONE, phenol, sodium chloride flush    Patient Profile   61 y/o AAF w/ h/o HTN, COPD, anemia, h/o poor med compliance and prior admission to [facility] 9/23 for UTI and pyelonephritis, admitted w/ mixed septic and cardiogenic shock.    Assessment/Plan   1. Shock: Now resolved.  Suspect mixed septic shock and cardiogenic shock.  Lactate was > 9 with PCT > 150.  E coli bacteremia with echo showing EF 20-25%.  Meropenem for broad spectrum coverage.   2. Acute systolic CHF -> cardiogenic shock: Echo showed EF 20-25% with preservation of apical function but severe hypokinesis of other walls, normal RV, dilated IVC. Unusual wall motion abnormality pattern, prior echo was normal. ?Septic cardiomyopathy with reverse Takotsubo-type pattern.  HS-TnI was minimally elevated with no trend so doubt ACS.  She is now off dobutamine. Creatinine 1.37 today. CVP 2.  - Bidil stopped, now on amlodipine 5 mg daily - Continue Coreg 3.125 mg bid.  - Remains intravascularly dry, low CVP and worsening creatinine. 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  3. AKI: In setting of septic shock/hypovolemia.  Creatinine 1.39 today with BUN 49.  4. ID: E coli bacteremia.  PCT > 150 initially. WBCs down to 11.7.  Also found to have Enterobacter cloacae in trach aspirate. Now afebrile.  - Meropenem now complete.  5. Anemia: Hgb pending today. Had transfuse 11/18.  - transfuse hgb < 7 6. Thrombocytopenia: Plts now normal. Suspect fall was due to critical illness/sepsis/inflammation. 7. Acute hypoxemic respiratory failure: PNA on CXR, also suspect pulmonary edema. Now on 2L [place]. She has a pre-existing history of COPD.   8. Ischemic digits bilateral hands as well as feet: Palpable brachial pulses, no palpable ulnar or radial pulses.  Ischemic digits with no flow on duplex US at wrists.  Suspect pressor-induced. High risk for auto-amputation.  - Continue heparin gtt  9. Neuro: Seems alert/oriented today, answers questions.  10. Hypernatremia: Na 539>767>341 today.  Still only  getting sips/chips and tube feeds.  - continue free water boluses, decrease to 300 cc q4 hrs with 3rd spacing - Needs repeat swallow study.   Length of Stay: [address] AGACNP-BC  02/14/2022, 8:25 AM  Advanced Heart Failure Team Pager [phone] (M-F; 7a - 5p)  Please contact [facility] for night-coverage after hours (5p -7a ) and weekends on [email or web address]  Patient seen with NP, agree with the above note.   Stable creatinine at 1.37, sodium normal. CVP low, still not taking po.    General: NAD Neck: No JVD, no thyromegaly or thyroid nodule.  Lungs: Clear to auscultation bilaterally with normal respiratory effort. CV: Nondisplaced PMI.  Heart regular S1/S2, no S3/S4, no murmur.  1+ ankle edema.  Abdomen: Soft, nontender, no hepatosplenomegaly, no distention.  Skin: Intact without lesions or rashes.  Neurologic: Alert and oriented x 3.  Psych: Normal affect. Extremities: Ischemic digits.  HEENT: Normal.   Stable from cardiac standpoint, had stress cardiomyopathy that has now recovered with repeat echo showing normal E.  She has been on Coreg and amlodipine for BP control, this is stable.   CVP low, agree with gentle IVF.  To have MBS today to see if she can resume diet.   Ischemic digits from pressors.  VVS following.  Continue heparin gtt.   Cardiology will sign off, call with questions.   Loralie Champagne 02/14/2022 9:51 AM

## 2022-02-14 NOTE — Progress Notes (Addendum)
Modified Barium Swallow Progress Note  Patient Details  Name: Nicole Cunningham MRN: [id number] Date of Birth: 08/03/1960  Today's Date: 02/14/2022  Modified Barium Swallow completed.  Full report located under Chart Review in the Imaging Section.  Brief recommendations include the following:  Clinical Impression  Pt presents with oropharyngeal dysphagia characterized by impaired mastication, weak lingual manipulation, impaired posterior propulsion, reduced anterior laryngeal movement, and a pharyngeal delay. She demonstrated prolonged mastication, difficulty with A-P transport, pyriform sinus residue and incomplete epiglottic inversion. Penetration (PAS 2,3) was noted with thin liquids and with nectar thick liquids. Aspiration (PAS 8) was observed with the initial two swallows of thin liquids via cup and straw secondary to the pharyngeal delay and spillover of residue in the pyriform sinuses. Very delayed weak coughing was inconsistently noted after aspiration. Prompted coughing mobilized aspirate above the vocal folds, but was ineffective in expelling it from the larynx. Laryngeal invasion worsened with use of a chin tuck posture. With prompts (i.e., "swallow hard" or "squeeze your muscles when you swallow") for an effortful swallow, penetration and aspiration were eliminated, but even with swallows that appeared less effortful, penetration (PAS 2) was WNL. A dysphagia 1 diet with thin liquids is recommended with observance of swallowing precautions.   Swallow Evaluation Recommendations       SLP Diet Recommendations: Dysphagia 1 (Puree) solids;Thin liquid   Liquid Administration via: Cup;Straw   Medication Administration: Crushed with puree   Supervision: Full supervision/cueing for compensatory strategies;Staff to assist with self feeding   Compensations: Slow rate;Small sips/bites;Effortful swallow   Postural Changes: Seated upright at 90 degrees   Oral Care Recommendations: Oral  care BID      Jabarri Stefanelli I. Vear Clock, MS, CCC-SLP Acute Rehabilitation Services Office number [phone]  Scheryl Marten 02/14/2022,2:30 PM

## 2022-02-14 NOTE — Progress Notes (Signed)
Mobility Specialist Progress Note    02/14/22 1116  Mobility  Activity Transferred from chair to bed  Level of Assistance +2 (takes two people)  Assistive Device MaxiMove  Activity Response Tolerated well  Mobility Referral Yes  $Mobility charge 1 Mobility   No complaints. Left with NT present.   Pierron Nation Mobility Specialist  Please Neurosurgeon or Rehab Office at [phone]

## 2022-02-14 NOTE — Progress Notes (Signed)
Physical Therapy Treatment Patient Details Name: Nicole Cunningham MRN: [id number] DOB: 05/31/1960 Today's Date: 02/14/2022   History of Present Illness 61 y.o. female presents to [facility] hospital on 01/29/2022 with AMS and SOB. Pt admitted with shock, AKI. CT abdomen pelvis 17 mm nonobstructive renal calculus without hydronephrosis. Pt intubated on 11/6. Pt developed acute systolic HF. Bronchoscopy 11/14 for worsening PNA. extubated 11/16. PMH includes COPD and HTN.    PT Comments    Pt smiling and agreeable to participate in physical therapy. Session focused on bed mobility, out of bed mobility via maxi move lift and therapeutic exercises for BLE strengthening/ROM. Pt requiring two person total assist for functional mobility. Demonstrates 2+/5 quad, 1/5 hip flexor, 0/5 ankle dorsiflexion strength. Will continue to progress mobility as tolerated.   Recommendations for follow up therapy are one component of a multi-disciplinary discharge planning process, led by the attending physician.  Recommendations may be updated based on patient status, additional functional criteria and insurance authorization.  Follow Up Recommendations  Skilled nursing-short term rehab (<3 hours/day) Can patient physically be transported by private vehicle: No   Assistance Recommended at Discharge Frequent or constant Supervision/Assistance  Patient can return home with the following Two people to help with walking and/or transfers;Two people to help with bathing/dressing/bathroom;Assistance with cooking/housework;Assistance with feeding;Direct supervision/assist for medications management;Direct supervision/assist for financial management;Assist for transportation;Help with stairs or ramp for entrance   Equipment Recommendations  Hospital bed;Other (comment);Wheelchair (measurements PT);Wheelchair cushion (measurements PT) (hoyer lift)    Recommendations for Other Services       Precautions / Restrictions  Precautions Precautions: Fall;Other (comment) Precaution Comments: necrotic digits in all extremities Restrictions Weight Bearing Restrictions: No     Mobility  Bed Mobility Overal bed mobility: Needs Assistance Bed Mobility: Rolling Rolling: Total assist, +2 for physical assistance, +2 for safety/equipment         General bed mobility comments: able to intiate BLE and BUE movement with cues and increased time. Ultimatelty total A for rolling    Transfers Overall transfer level: Needs assistance Equipment used: Ambulation equipment used Transfers: Bed to chair/wheelchair/BSC             General transfer comment: total A Transfer via Lift Equipment: Maximove  Ambulation/Gait                   Stairs             Wheelchair Mobility    Modified Rankin (Stroke Patients Only)       Balance Overall balance assessment: Needs assistance Sitting-balance support: Bilateral upper extremity supported, Feet supported Sitting balance-[name]: Poor Sitting balance - Comments: supported in chair                                    Cognition Arousal/Alertness: Awake/alert Behavior During Therapy: Flat affect Overall Cognitive Status: Difficult to assess                                 General Comments: seemingly slow processing, not many attempts of verbalizing. soft spoken. intiates most tasks after command        Exercises General Exercises - Lower Extremity Quad Sets: Both, 10 reps, Seated Long Arc Quad: Both, 10 reps, Seated Hip ABduction/ADduction: Both, 10 reps, Seated    General Comments General comments (skin integrity, edema, etc.): VSS on  2L O2      Pertinent Vitals/Pain Pain Assessment Pain Assessment: Faces Faces Pain Scale: Hurts a little bit Pain Location: BUEs with movement Pain Descriptors / Indicators: Grimacing Pain Intervention(s): Monitored during session    Home Living Family/patient  expects to be discharged to:: Other (Comment) Living Arrangements: Spouse/significant other;Children Available Help at Discharge: Family;Available PRN/intermittently Type of Home: Other(Comment) Home Access: Level entry       Home Layout: One level Home Equipment: None Additional Comments: Hotel set up    Prior Function            PT Goals (current goals can now be found in the care plan section) Acute Rehab PT Goals Potential to Achieve Goals: Fair    Frequency    Min 2X/week      PT Plan Current plan remains appropriate    Co-evaluation PT/OT/SLP Co-Evaluation/Treatment: Yes Reason for Co-Treatment: Complexity of the patient's impairments (multi-system involvement);Necessary to address cognition/behavior during functional activity;For patient/therapist safety;To address functional/ADL transfers PT goals addressed during session: Mobility/safety with mobility OT goals addressed during session: ADL's and self-care      AM-PAC PT "6 Clicks" Mobility   Outcome Measure  Help needed turning from your back to your side while in a flat bed without using bedrails?: Total Help needed moving from lying on your back to sitting on the side of a flat bed without using bedrails?: Total Help needed moving to and from a bed to a chair (including a wheelchair)?: Total Help needed standing up from a chair using your arms (e.g., wheelchair or bedside chair)?: Total Help needed to walk in hospital room?: Total Help needed climbing 3-5 steps with a railing? : Total 6 Click Score: 6    End of Session   Activity Tolerance: Patient tolerated treatment well Patient left: with call bell/phone within reach;in chair;with chair alarm set Nurse Communication: Mobility status;Need for lift equipment PT Visit Diagnosis: Other abnormalities of gait and mobility (R26.89);Pain;Muscle weakness (generalized) (M62.81)     Time: 5784-6962 PT Time Calculation (min) (ACUTE ONLY): 33  min  Charges:  $Therapeutic Activity: 8-22 mins                     Lillia Pauls, PT, DPT Acute Rehabilitation Services Office [phone]    Norval Morton 02/14/2022, 11:09 AM

## 2022-02-14 NOTE — Progress Notes (Signed)
[facility]  Subjective  -unable to use or move her hands bilaterally   Objective (!) 111/55 98 97.6 F (36.4 C) (Oral) 20 100%  Intake/Output Summary (Last 24 hours) at 02/14/2022 1738 Last data filed at 02/14/2022 1700 Gross per 24 hour  Intake 4067.62 ml  Output --  Net 4067.62 ml    Profound ischemic changes to both hands with dry gangrene all digits  Laboratory Lab Results: Recent Labs    02/13/22 0340 02/14/22 0410  WBC 11.7* 9.3  HGB 7.9* 7.6*  HCT 23.5* 23.2*  PLT 278 281   BMET Recent Labs    02/13/22 0340 02/14/22 0410  NA 149* 144  K 3.6 3.9  CL 124* 122*  CO2 14* 15*  GLUCOSE 138* 124*  BUN 52* 49*  CREATININE 1.37* 1.39*  CALCIUM 8.3* 7.8*    COAG Lab Results  Component Value Date   INR 2.2 (H) 01/29/2022   INR 1.8 (H) 01/28/2022   INR 1.1 04/18/2020   No results found for: "PTT"  Assessment/Planning:  61 year old female admitted with cardiogenic and septic shock that vascular surgery was asked to evaluate in the ICU about 10 days ago for ischemic changes to both hands.  At the time she had bilateral upper extremity arterial duplexes showing no evidence of thrombus.  This was felt to be low flow state and pressor induced changes with ischemic changes to both hands.  She has since been started on heparin since her platelet and hemoglobin count have recovered (this was not an option at the time of initial evaluation).  I can only get Doppler flow into the mid forearm bilaterally in the radial artery.    Will benefit from hand surgery evaluation prior to discharge if family continues to want full intervention.  I still feel strongly that she needs to consider hospice.  I am not sure her hands are salvageable at this point given she has no motor function with ischemic changes to both hands from prolonged ICU stay with profound shock for a long period of time.  Continue heparin from our standpoint.  Continue to  allow hands to demarcate.    Cephus Shelling 02/14/2022 5:38 PM --

## 2022-02-15 LAB — BASIC METABOLIC PANEL
Anion gap: 8 (ref 5–15)
BUN: 42 mg/dL — ABNORMAL HIGH (ref 8–23)
CO2: 17 mmol/L — ABNORMAL LOW (ref 22–32)
Calcium: 8.1 mg/dL — ABNORMAL LOW (ref 8.9–10.3)
Chloride: 116 mmol/L — ABNORMAL HIGH (ref 98–111)
Creatinine, Ser: 1.23 mg/dL — ABNORMAL HIGH (ref 0.44–1.00)
GFR, Estimated: 49 mL/min — ABNORMAL LOW (ref >60)
Glucose, Bld: 113 mg/dL — ABNORMAL HIGH (ref 70–99)
Potassium: 4 mmol/L (ref 3.5–5.1)
Sodium: 141 mmol/L (ref 135–145)

## 2022-02-15 LAB — CBC
HCT: 22.4 % — ABNORMAL LOW (ref 36.0–46.0)
Hemoglobin: 7.3 g/dL — ABNORMAL LOW (ref 12.0–15.0)
MCH: 26.7 pg (ref 26.0–34.0)
MCHC: 32.6 g/dL (ref 30.0–36.0)
MCV: 82.1 fL (ref 80.0–100.0)
Platelets: 258 10*3/uL (ref 150–400)
RBC: 2.73 MIL/uL — ABNORMAL LOW (ref 3.87–5.11)
WBC: 7.4 10*3/uL (ref 4.0–10.5)
nRBC: 0 % (ref 0.0–0.2)

## 2022-02-15 LAB — GLUCOSE, CAPILLARY
Glucose-Capillary: 99 mg/dL (ref 70–99)
Glucose-Capillary: 97 mg/dL (ref 70–99)
Glucose-Capillary: 118 mg/dL — ABNORMAL HIGH (ref 70–99)
Glucose-Capillary: 102 mg/dL — ABNORMAL HIGH (ref 70–99)
Glucose-Capillary: 97 mg/dL (ref 70–99)

## 2022-02-15 LAB — HEPARIN LEVEL (UNFRACTIONATED): Heparin Unfractionated: 0.54 IU/mL (ref 0.30–0.70)

## 2022-02-15 MED ORDER — FREE WATER
300.0000 mL | Freq: Three times a day (TID) | Status: DC
  Administered 2022-02-15 – 2022-02-20 (×14): 300 mL

## 2022-02-15 NOTE — Progress Notes (Signed)
ANTICOAGULATION CONSULT NOTE - Follow Up Consult  Pharmacy Consult for Heparin Indication: ischemic digits  No Active Allergies  Patient Measurements: Height: 5\' 4"  (162.6 cm) Weight: 56.5 kg (124 lb 9 oz) IBW/kg (Calculated) : 54.7 Heparin Dosing Weight: 48 kg  Vital Signs: Temp: 98 F (36.7 C) (11/23 0802) Temp Source: Oral (11/23 0802) BP: 121/56 (11/23 0802) Pulse Rate: 102 (11/23 0802)  Labs: Recent Labs    02/13/22 0340 02/14/22 0410 02/15/22 0500  HGB 7.9* 7.6* 7.3*  HCT 23.5* 23.2* 22.4*  PLT 278 281 258  HEPARINUNFRC 0.42 0.43 0.54  CREATININE 1.37* 1.39* 1.23*     Estimated Creatinine Clearance: 40.4 mL/min (A) (by C-G formula based on SCr of 1.23 mg/dL (H)).   Medical History: Past Medical History:  Diagnosis Date   Bronchitis    COPD (chronic obstructive pulmonary disease) (HCC)    Hypertension     Assessment: 61 yo female presented on 11/6 with AMS found to have e coli bacteremia and mixed cardiogenic/septic shock. Course complicated by ischemic fingers and vascular consulted but unable to use IV heparin due to thrombocytopenia. Noted dark tarry stools and noted somewhat bloody smears on 11/13 per RN. Pharmacy consulted to dose heparin with low goal and no bolus now that plts >50.  Heparin level today is slightly supratherapeutic at 0.54, on heparin infusion at 1250 units/hr. Hgb 7.3 (low,stable), plt 258 (stable). No s/sx of bleeding or infusion issues. Day-shift nurse unaware of whether heparin gtt was paused prior to night-shift nurse obtaining level. Will slightly decrease heparin gtt due to heparin level increase 0.43>>0.54 from yesterday.   Goal of Therapy:  Heparin level 0.3-0.5 units/ml Monitor platelets by anticoagulation protocol: Yes   Plan:  Decrease heparin gtt to 1200 units/hr Monitor heparin level, CBC, and s/sx of bleeding daily   12/13, PharmD PGY1 Pharmacy Resident 11/23/20239:47 AM

## 2022-02-15 NOTE — Progress Notes (Addendum)
PROGRESS NOTE    Nicole Cunningham  NTZ:[id number] DOB: Nov 15, 1960 DOA: 01/28/2022 PCP: [name], MD   Brief Narrative:  Patient is a 61 old female with history of COPD, anemia, hypertension who was brought to the emergency department on 11/6 with altered mental status, shortness of breath.  On presentation, lab work showed creatinine of 4.1, lactic acid of 9.  CT head, chest x-ray did not show any acute findings.  CT abdomen/pelvis showed 17 mm nonobstructing renal calculus without hydronephrosis.  Patient was admitted for the management of septic shock under ICU service.  Patient had a prolonged hospital course, found to have E. coli bacteremia, echo showed EF of 20 to 25% for which cardiology consulted.  Patient was managed for cardiogenic/septic shock.  Transferred to [facility] service on 11/18.   Significant events as per PCCM :   11/6 septic shock on levo , ETT >> 11/6 Echo EF 20 to 25% global hypokinesis, apex intact 11/6 RIJ HD cath >> dobutamine at 5 mics 11/7>>Dobutamine increased to 7.5 mics , 1 unit PRBC and 1 unit platelet transfusion 11/8 dobutamine increased to 10 mics 11/10 dobut wean, diuretic, pigtail  11/13 restarted levophed overnight , Short run of SVT, weaned Dobutamine to 3 , repleted K, 1 Unit PRBC per cards ( HGB 7.2)  Assessment & Plan:   Principal Problem:   Septic shock (HCC) Active Problems:   Sepsis (HCC)   Acute encephalopathy   Pressure injury of skin  Shock: Mixed cardiogenic and septic shock.  Suspicion for sepsis cardiomyopathy.  Heart failure team following, plan for cardiac cath when appropriate.  Dobutamine stopped.  Off vasopressors. Remains in sinus tachycardia with stable blood pressure.  Monitor on telemetry.  Repeat echo shows 70% ejection fraction/hyperdynamic left ventricular.  No plans for cardiac cath.  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  Dysphagia: Patient underwent MBS on 02/14/2022 and has been allowed dysphagia 1 diet.    Bacteremia: Blood culture/urine culture showed E. coli.  Patient completed 10 days of Merrem antibiotics.   AKI: Likely from ATN from shock.  Renal function has stabilized.  She is getting some IV fluids by cardiology today.   Pancytopenia/microcytic anemia.: Thought to be sepsis related.  She has thus far received 4 units of PRBC transfusion and 2 units of platelet transfusion during this hospitalization.  Hemoglobin dropped to the range of 6 and she received 1 unit of PRBC transfusion on 02/11/2022.  Hemoglobin stable since then.  Monitor CBC daily.   Acute hypoxic respiratory failure secondary to multifocal pneumonia: History of COPD.  Currently on Brovana, Pulmicort, DuoNeb. currently on 2 L of oxygen via nasal cannula.  Tracheal aspirate has shown Enterobacter cloacae.  Continue current antibiotics. Patient was also found to have left parapneumonic effusion.  Status post pigtail placement, now has been removed.  Extubated on 11/16 with no intention to intubate.   Acute systolic CHF: Heart failure team following.  Echo showed EF of 20 to 25% with severe hypokinesis, normal RV, dilated IVC.  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  She is getting some IV fluids again today.  Cardiology managing.   Septic encephalopathy:   CT head on presentation did not show any acute findings.  Currently unable to swallow, speech therapy following and recommending n.p.o.  Currently on tube feeding.  Speech therapy should continue to follow and evaluate her swallowing so that the feeding can be discontinued.  Started on Seroquel for agitation by PCCM team.  Might need to be titrated. Mental status has significantly improved and she is currently alert and oriented.  Follows commands.     Upper extremity ischemia with dry gangrene: Present on admission.  Worse on the right side.  Also suspected to have  worsening from pressures causing vaso constriction.Vascular surgery following.  Currently on low-dose heparin.  No plan for intervention.  Rx for autoamputation.  Vascular surgery following.  Per them, her hands are nonsalvageable and patient may need evaluation by hand surgery at some point in time however patient carries very poor prognosis with multiple comorbidities.   Hypokalemia/hypernatremia: Sodium and potassium both normalized, reduce free water boluses frequency from every 4 hours to every 8 hours.   Nonobstructing ureteral stone: We recommend follow-up with urology as an outpatient.  Thrombocytopenia: Likely sepsis driven.  Improved.   Goals of care/deconditioning/debility: Palliative care closely following.  Currently DNR.  Needs to continue discussing goals of care.  PT/OT recommending skilled facility on discharge.  After discussion with family, they are agreeable for comfort care if she declines further.  I have reached out to the palliative care, I and vascular surgery both believe that patient has significantly poor prognosis due to multiple comorbidities and she really is appropriate for hospice.  Will defer to palliative care for those discussions with the family.   DVT prophylaxis: Place and maintain sequential compression device Start: 01/29/22 1021   Code Status: DNR  Family Communication:  None present at bedside.  Status is: Inpatient Remains inpatient appropriate because: Patient very sick.     Estimated body mass index is 21.38 kg/m as calculated from the following:   Height as of this encounter: 5\' 4"  (1.626 m).   Weight as of this encounter: 56.5 kg.  Pressure Injury 02/08/22 Buttocks Right Stage 2 -  Partial thickness loss of dermis presenting as a shallow open injury with a red, pink wound bed without slough. (Active)  02/08/22 0800  Location: Buttocks  Location Orientation: Right  Staging: Stage 2 -  Partial thickness loss of dermis presenting as a shallow  open injury with a red, pink wound bed without slough.  Wound Description (Comments):   Present on Admission:   Dressing Type Foam - Lift dressing to assess site every shift 02/15/22 0802   Nutritional Assessment: Body mass index is 21.38 kg/m.02/17/22 Seen by dietician.  I agree with the assessment and plan as outlined below: Nutrition Status: Nutrition Problem: Severe Malnutrition Etiology: chronic illness (COPD) Signs/Symptoms: severe muscle depletion, severe fat depletion Interventions: Tube feeding  . Skin Assessment: I have examined the patient's skin and I agree with the wound assessment as performed by the wound care RN as outlined below: Pressure Injury 02/08/22 Buttocks Right Stage 2 -  Partial thickness loss of dermis presenting as a shallow open injury with a red, pink wound bed without slough. (Active)  02/08/22 0800  Location: Buttocks  Location Orientation: Right  Staging: Stage 2 -  Partial thickness loss of dermis presenting as a shallow open injury with a red, pink wound bed without slough.  Wound Description (Comments):   Present on Admission:   Dressing Type Foam - Lift dressing to assess site every shift 02/15/22 0802    Consultants:  Cardiology Vascular surgery Palliative care Procedures:  As above  Antimicrobials:  Anti-infectives (From admission, onward)    Start     Dose/Rate Route Frequency Ordered Stop   02/04/22 1345  meropenem (MERREM) 1  g in sodium chloride 0.9 % 100 mL IVPB        1 g 200 mL/hr over 30 Minutes Intravenous Every 12 hours 02/04/22 1258 02/13/22 2350   02/01/22 2200  ceFAZolin (ANCEF) IVPB 2g/100 mL premix  Status:  Discontinued        2 g 200 mL/hr over 30 Minutes Intravenous Every 12 hours 02/01/22 0754 02/04/22 1258   01/29/22 2200  ceFEPIme (MAXIPIME) 1 g in sodium chloride 0.9 % 100 mL IVPB  Status:  Discontinued        1 g 200 mL/hr over 30 Minutes Intravenous Every 24 hours 01/29/22 0046 01/29/22 0242   01/29/22 2200   cefTRIAXone (ROCEPHIN) 2 g in sodium chloride 0.9 % 100 mL IVPB  Status:  Discontinued        2 g 200 mL/hr over 30 Minutes Intravenous Every 24 hours 01/29/22 0242 02/01/22 0754   01/29/22 0048  vancomycin variable dose per unstable renal function (pharmacist dosing)  Status:  Discontinued         Does not apply See admin instructions 01/29/22 0048 01/29/22 1343   01/28/22 2230  ceFEPIme (MAXIPIME) 2 g in sodium chloride 0.9 % 100 mL IVPB        2 g 200 mL/hr over 30 Minutes Intravenous  Once 01/28/22 2228 01/28/22 2350   01/28/22 2230  metroNIDAZOLE (FLAGYL) IVPB 500 mg        500 mg 100 mL/hr over 60 Minutes Intravenous  Once 01/28/22 2228 01/29/22 0021   01/28/22 2230  vancomycin (VANCOCIN) IVPB 1000 mg/200 mL premix        1,000 mg 200 mL/hr over 60 Minutes Intravenous  Once 01/28/22 2228 01/29/22 0150         Subjective:  Seen and examined.  She has no new complaints.  Objective: Vitals:   02/15/22 0400 02/15/22 0406 02/15/22 0728 02/15/22 0802  BP: 107/61   (!) 121/56  Pulse: 93   (!) 102  Resp: (!) 22   (!) 25  Temp: 97.8 F (36.6 C)   98 F (36.7 C)  TempSrc: Oral   Oral  SpO2: 100%  100% 100%  Weight:  56.5 kg    Height:        Intake/Output Summary (Last 24 hours) at 02/15/2022 1127 Last data filed at 02/15/2022 0800 Gross per 24 hour  Intake 110 ml  Output 300 ml  Net -190 ml    Filed Weights   02/13/22 0320 02/14/22 0359 02/15/22 0406  Weight: 59.2 kg 58.1 kg 56.5 kg    Examination:  General exam: Appears calm and comfortable  Respiratory system: Clear to auscultation. Respiratory effort normal. Cardiovascular system: S1 & S2 heard, RRR. No JVD, murmurs, rubs, gallops or clicks.  +3 pitting edema bilateral lower extremity, +2 pitting edema bilateral upper extremities. Gastrointestinal system: Abdomen is nondistended, soft and nontender. No organomegaly or masses felt. Normal bowel sounds heard. Central nervous system: Alert and oriented. No focal  neurological deficits. Extremities: Symmetric 5 x 5 power.  Gangrenous fingers both hands Skin: No rashes, lesions or ulcers.   Data Reviewed: I have personally reviewed following labs and imaging studies  CBC: Recent Labs  Lab 02/11/22 0440 02/12/22 0420 02/13/22 0340 02/14/22 0410 02/15/22 0500  WBC 14.1* 14.2* 11.7* 9.3 7.4  HGB 8.7* 8.1* 7.9* 7.6* 7.3*  HCT 25.5* 23.9* 23.5* 23.2* 22.4*  MCV 76.6* 78.4* 79.1* 81.7 82.1  PLT 227 243 278 281 258    Basic Metabolic Panel:  Recent Labs  Lab 02/11/22 0440 02/12/22 0420 02/13/22 0340 02/14/22 0410 02/15/22 0500  NA 152* 154* 149* 144 141  K 3.6 3.7 3.6 3.9 4.0  CL 130* 126* 124* 122* 116*  CO2 16* 16* 14* 15* 17*  GLUCOSE 151* 151* 138* 124* 113*  BUN 50* 51* 52* 49* 42*  CREATININE 1.38* 1.31* 1.37* 1.39* 1.23*  CALCIUM 8.2* 8.4* 8.3* 7.8* 8.1*    GFR: Estimated Creatinine Clearance: 40.4 mL/min (A) (by C-G formula based on SCr of 1.23 mg/dL (H)). Liver Function Tests: No results for input(s): "AST", "ALT", "ALKPHOS", "BILITOT", "PROT", "ALBUMIN" in the last 168 hours.  No results for input(s): "LIPASE", "AMYLASE" in the last 168 hours. No results for input(s): "AMMONIA" in the last 168 hours. Coagulation Profile: No results for input(s): "INR", "PROTIME" in the last 168 hours. Cardiac Enzymes: No results for input(s): "CKTOTAL", "CKMB", "CKMBINDEX", "TROPONINI" in the last 168 hours. BNP (last 3 results) No results for input(s): "PROBNP" in the last 8760 hours. HbA1C: No results for input(s): "HGBA1C" in the last 72 hours. CBG: Recent Labs  Lab 02/14/22 1626 02/14/22 2016 02/14/22 2354 02/15/22 0439 02/15/22 0759  GLUCAP 95 104* 98 99 97    Lipid Profile: No results for input(s): "CHOL", "HDL", "LDLCALC", "TRIG", "CHOLHDL", "LDLDIRECT" in the last 72 hours. Thyroid Function Tests: No results for input(s): "TSH", "T4TOTAL", "FREET4", "T3FREE", "THYROIDAB" in the last 72 hours. Anemia Panel: No  results for input(s): "VITAMINB12", "FOLATE", "FERRITIN", "TIBC", "IRON", "RETICCTPCT" in the last 72 hours. Sepsis Labs: No results for input(s): "PROCALCITON", "LATICACIDVEN" in the last 168 hours.  Recent Results (from the past 240 hour(s))  Culture, blood (Routine X 2) w Reflex to ID Panel     Status: None   Collection Time: 02/05/22 11:38 AM   Specimen: BLOOD  Result Value Ref Range Status   Specimen Description BLOOD PICC LINE  Final   Special Requests   Final    BOTTLES DRAWN AEROBIC AND ANAEROBIC Blood Culture adequate volume   Culture   Final    NO GROWTH 5 DAYS Performed at [facility], [address] [place], [place] [address]    Report Status 02/10/2022 FINAL  Final  Culture, blood (Routine X 2) w Reflex to ID Panel     Status: None   Collection Time: 02/05/22 11:38 AM   Specimen: BLOOD  Result Value Ref Range Status   Specimen Description BLOOD PICC LINE  Final   Special Requests   Final    BOTTLES DRAWN AEROBIC AND ANAEROBIC Blood Culture adequate volume   Culture   Final    NO GROWTH 5 DAYS Performed at [facility], [address] [place], [place] [address]    Report Status 02/10/2022 FINAL  Final  Culture, Respiratory w Gram Stain     Status: None   Collection Time: 02/06/22 10:56 AM   Specimen: Tracheal Aspirate; Respiratory  Result Value Ref Range Status   Specimen Description TRACHEAL ASPIRATE  Final   Special Requests NONE  Final   Gram Stain   Final    NO WBC SEEN NO ORGANISMS SEEN Performed at [facility], [address] [place], [place] [address]    Culture MODERATE ENTEROBACTER CLOACAE  Final   Report Status 02/08/2022 FINAL  Final   Organism ID, Bacteria ENTEROBACTER CLOACAE  Final      Susceptibility   Enterobacter cloacae - MIC*    CEFAZOLIN >=64 RESISTANT Resistant     CEFEPIME  2 SENSITIVE Sensitive     CEFTAZIDIME >=64 RESISTANT Resistant     CIPROFLOXACIN <=0.25 SENSITIVE Sensitive     GENTAMICIN <=1 SENSITIVE  Sensitive     IMIPENEM <=0.25 SENSITIVE Sensitive     TRIMETH/SULFA <=20 SENSITIVE Sensitive     PIP/TAZO >=128 RESISTANT Resistant     * MODERATE ENTEROBACTER CLOACAE     Radiology Studies: DG Swallowing Func-Speech Pathology  Result Date: 02/14/2022 Table formatting from the original result was not included. Objective Swallowing Evaluation: Type of Study: MBS-Modified Barium Swallow Study  Patient Details Name: EUNIE LAWN MRN: [id number] Date of Birth: September 19, 1960 Today's Date: 02/14/2022 Time: SLP Start Time (ACUTE ONLY): 1310 -SLP Stop Time (ACUTE ONLY): 1330 SLP Time Calculation (min) (ACUTE ONLY): 20 min Past Medical History: Past Medical History: Diagnosis Date  Bronchitis   COPD (chronic obstructive pulmonary disease) (HCC)   Hypertension  Past Surgical History: No past surgical history on file. HPI: Patient is a 61 year old female who presented to [facility] ED on 11/6 with AMS, UTI and pyelonephritis, cardiogenic and septic shock, AKI, acute hypoxic respiratory failure due to multifocal pna caused by ESBL enterobacter, L parapneumonic effusion, bilateral finger dry gangrene.  ETT 11/6-11/16. PMH of COPD, anemia, HTN. Failed [facility] swallow screen after extubation.  Subjective: alert  Recommendations for follow up therapy are one component of a multi-disciplinary discharge planning process, led by the attending physician.  Recommendations may be updated based on patient status, additional functional criteria and insurance authorization. Assessment / Plan / Recommendation   02/14/2022   1:49 PM Clinical Impressions Clinical Impression Pt presents with oropharyngeal dysphagia characterized by impaired mastication, weak lingual manipulation, impaired posterior propulsion, reduced anterior laryngeal movement, and a pharyngeal delay. She demonstrated prolonged mastication, difficulty with A-P transport, pyriform sinus residue and incomplete epiglottic inversion. Penetration (PAS 2,3) was noted with thin  liquids and with nectar thick liquids. Aspiration (PAS 8) was observed with the initial two swallows of thin liquids via cup and straw secondary to the pharyngeal delay and spillover of residue in the pyriform sinuses. Very delayed weak coughing was inconsistently noted after aspiration. Prompted coughing mobilized aspirate above the vocal folds, but was ineffective in expelling it from the larynx. Laryngeal invasion worsened with use of a chin tuck posture. With prompts (i.e., "swallow hard" or "squeeze your muscles when you swallow") for an effortful swallow, penetration and aspiration were eliminated, but even with swallows that appeared less effortful, penetration (PAS 2) was WNL. A dysphagia 1 diet with thin liquids is recommended with observance of swallowing precautions. SLP Visit Diagnosis Dysphagia, oropharyngeal phase (R13.12) Impact on safety and function Mild aspiration risk     02/14/2022   1:49 PM Treatment Recommendations Treatment Recommendations Therapy as outlined in treatment plan below     02/14/2022   1:49 PM Prognosis Prognosis for Safe Diet Advancement Good   02/14/2022   1:49 PM Diet Recommendations SLP Diet Recommendations Dysphagia 1 (Puree) solids;Thin liquid Liquid Administration via Cup;Straw Medication Administration Crushed with puree Compensations Slow rate;Small sips/bites;Effortful swallow Postural Changes Seated upright at 90 degrees     02/14/2022   1:49 PM Other Recommendations Oral Care Recommendations Oral care BID Follow Up Recommendations Skilled nursing-short term rehab (<3 hours/day) Functional Status Assessment Patient has had a recent decline in their functional status and demonstrates the ability to make significant improvements in function in a reasonable and predictable amount of time.   02/14/2022   1:49 PM Frequency and Duration  Speech Therapy Frequency (ACUTE ONLY) min  2x/week Treatment Duration 2 weeks     02/14/2022   1:49 PM Oral Phase Oral Phase Impaired Oral -  Nectar Straw Weak lingual manipulation;Reduced posterior propulsion Oral - Thin Cup Weak lingual manipulation;Reduced posterior propulsion Oral - Thin Straw Weak lingual manipulation;Reduced posterior propulsion Oral - Puree Weak lingual manipulation;Reduced posterior propulsion Oral - Regular Weak lingual manipulation;Reduced posterior propulsion;Impaired mastication Oral - Pill Weak lingual manipulation;Reduced posterior propulsion;Impaired mastication    02/14/2022   1:49 PM Pharyngeal Phase Pharyngeal Phase Impaired Pharyngeal- Nectar Cup Delayed swallow initiation-vallecula;Reduced anterior laryngeal mobility;Reduced epiglottic inversion;Pharyngeal residue - pyriform Pharyngeal- Nectar Straw Delayed swallow initiation-vallecula;Reduced anterior laryngeal mobility;Reduced epiglottic inversion;Pharyngeal residue - pyriform Pharyngeal- Thin Cup Delayed swallow initiation-vallecula;Reduced anterior laryngeal mobility;Reduced epiglottic inversion;Pharyngeal residue - pyriform;Penetration/Aspiration during swallow;Penetration/Apiration after swallow;Delayed swallow initiation-pyriform sinuses Pharyngeal Material enters airway, remains ABOVE vocal cords then ejected out;Material enters airway, remains ABOVE vocal cords and not ejected out;Material enters airway, passes BELOW cords without attempt by patient to eject out (silent aspiration) Pharyngeal- Thin Straw Delayed swallow initiation-vallecula;Reduced anterior laryngeal mobility;Reduced epiglottic inversion;Pharyngeal residue - pyriform;Penetration/Aspiration during swallow;Penetration/Apiration after swallow;Delayed swallow initiation-pyriform sinuses Pharyngeal Material enters airway, remains ABOVE vocal cords and not ejected out;Material enters airway, remains ABOVE vocal cords then ejected out;Material enters airway, passes BELOW cords without attempt by patient to eject out (silent aspiration) Pharyngeal- Puree Delayed swallow initiation-vallecula;Reduced  anterior laryngeal mobility;Reduced epiglottic inversion;Pharyngeal residue - pyriform Pharyngeal- Regular Delayed swallow initiation-vallecula;Reduced anterior laryngeal mobility;Reduced epiglottic inversion;Pharyngeal residue - pyriform    02/14/2022   1:49 PM Cervical Esophageal Phase  Cervical Esophageal Phase [facility] Shanika I. Vear Clock, MS, CCC-SLP Acute Rehabilitation Services Office number [phone] Scheryl Marten 02/14/2022, 2:38 PM                      Scheduled Meds:  acetaminophen  1,000 mg Per Tube TID   amLODipine  5 mg Oral Daily   arformoterol  15 mcg Nebulization BID   budesonide (PULMICORT) nebulizer solution  0.25 mg Nebulization BID   carvedilol  3.125 mg Oral BID WC   Chlorhexidine Gluconate Cloth  6 each Topical Q0600   docusate  100 mg Per Tube BID   FLUoxetine  20 mg Per Tube Daily   free water  300 mL Per Tube Q4H   insulin aspart  0-6 Units Subcutaneous Q4H   mouth rinse  15 mL Mouth Rinse 4 times per day   polyethylene glycol  17 g Per Tube Daily   QUEtiapine  50 mg Per Tube BID   sodium chloride flush  10-40 mL Intracatheter Q12H   Continuous Infusions:  sodium chloride 10 mL/hr at 02/12/22 0600   sodium chloride Stopped (02/09/22 2311)   feeding supplement (VITAL 1.5 CAL) 1,000 mL (02/14/22 1134)   heparin 1,200 Units/hr (02/15/22 1005)     LOS: 17 days   Hughie Closs, MD [facility]  02/15/2022, 11:27 AM   *Please note that this is a verbal dictation therefore any spelling or grammatical errors are due to the "Dragon [employer] One" system interpretation.  Please page via Amion and do not message via secure chat for urgent patient care matters. Secure chat can be used for non urgent patient care matters.  How to contact the [facility] Attending or Consulting provider 7A - 7P or covering provider during after hours 7P -7A, for this patient?  Check the care team in [facility] and look for a) attending/consulting TRH provider listed and b) the [facility] team listed.  Page or secure chat  7A-7P. Log into [email or web address] and use [facility] universal password to access. If you do not have the password, please contact the hospital operator. Locate the [facility] provider you are looking for under [facility] and page to a number that you can be directly reached. If you still have difficulty reaching the provider, please page the [facility] (Director on Call) for the Hospitalists listed on amion for assistance.

## 2022-02-15 NOTE — Progress Notes (Signed)
Daily Progress Note   Patient Name: Nicole Cunningham       Date: 02/15/2022 DOB: Mar 13, 1961  Age: 61 y.o. MRN#: [id number] Attending Physician: Darliss Cheney, MD Primary Care Physician: [name], MD Admit Date: 01/28/2022 Length of Stay: 17 days  Reason for Consultation/Follow-up: Establishing goals of care  HPI/Patient Profile:  61 y.o. female  with past medical history of COPD, anemia, HTN [facility] with AMS. The patient recently admitted to [facility] with AMS on 11/2021, found to have UTI and pyelonephritis during that admission.  On 11/5, patient having increased confusion.  Having some SOB over the past couple of days.  Brought to [facility] ED for further eval. Upon arrival to [facility] ED, patient confused but able to state name.  Admitted with shock, AKI, lactic acidosis of 9.0  She was found to have E. coli bacteremia and EF 20 to 25% on echo, mixed cardiogenic and septic shock. 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 Vascular surgery recommending heparin.   PMT was consulted for [place] conversations.  Subjective:   Subjective: Chart Reviewed. Updates received. Patient Assessed. Created space and opportunity for patient  and family to explore thoughts and feelings regarding current medical situation.  Today's Discussion: Today I met with the patient, her husband, and her son at the bedside.  She is awake, alert, still with a weak voice.  We discussed her progress since being in the ICU.  Specifically, we discussed the situation with her hands being ischemic.  I shared that vascular surgery does not feel her hands are likely salvageable although they are recommending continued heparin and allowing demarcation to better inform plan moving forward.  She seems a bit shocked and saddened by the idea that she may lose her fingers and/or hands.  We discussed possible options including prosthesis, depending on which path we choose.  I indicated there is no need for any decision  today.  In the meantime we will continue to treat her medically with the goal of improving from her critical illness to be able to discharge from the hospital.    If there is an emergent change in her hands that will be dealt with emergently by vascular surgery.  However, options moving forward depends on how she does in the coming days. We agreed to take it a day at a time.  Further discussions on goals once options and likely course become clear.  She admits pain in her bilateral hands, difficulty using them.  However, pain medicine is helpful and I recommended she continue to ask for this as needed.  Hands remain cold, painful.  No other pain.  No dyspnea, nausea, vomiting.  I provided emotional and general support through therapeutic listening, empathy, sharing of stories, therapeutic touch, and other techniques. I answered all questions and addressed all concerns to the best of my ability.  Review of Systems  Respiratory:  Negative for cough and shortness of breath.   Cardiovascular:  Negative for chest pain.  Gastrointestinal:  Negative for nausea and vomiting.  Musculoskeletal:        Bilateral hand pain    Objective:   Vital Signs:  BP (!) 121/56 (BP Location: Left Leg)   Pulse (!) 102   Temp 98 F (36.7 C) (Oral)   Resp (!) 25   Ht _0  (1.626 m)   Wt 56.5 kg   SpO2 100%   BMI 21.38 kg/m   Physical Exam: Physical Exam Vitals and nursing note  reviewed.  Constitutional:      General: She is not in acute distress.    Appearance: She is ill-appearing.  HENT:     Head: Normocephalic and atraumatic.  Cardiovascular:     Rate and Rhythm: Normal rate.  Pulmonary:     Effort: Pulmonary effort is normal. No respiratory distress.  Abdominal:     General: Abdomen is flat. Bowel sounds are normal.     Palpations: Abdomen is soft.  Skin:    General: Skin is warm and dry.     Comments: Darkened apparent gangrene to bilateral hands/fingers with discoloration extending to  mid-forearm  Neurological:     General: No focal deficit present.     Mental Status: She is alert.  Psychiatric:        Mood and Affect: Mood normal.        Behavior: Behavior normal.     Palliative Assessment/Data: 30%    Existing Vynca/ACP Documentation: None  Assessment & Plan:   Impression: Present on Admission:  Septic shock ([place])  Sepsis ([place])  61 year old female with chronic comorbidities and acute presentations as described above.  She has poor functional status as an outpatient, COPD.  She had mixed septic and cardiogenic shock with AKI, pancytopenia, septic encephalopathy, multifocal pneumonia requiring pressors and eventually will need a left and right heart cath.  She also has limb ischemia to bilateral hands and bilateral feet and was unable to start heparin due to pancytopenia until two days ago. Remains at risk for autoamputation.  She was intubated for approximately 10 days, successful extubation (planned one-way with no intent to reintubate).  She is overall clinically improved.  Big issue at this point is dry gangrene to all digits, ischemia to hands extending with skin discoloration up to the mid forearm.  Vascular surgery is on board and recommending heparin, further options depending on progress over the ensuing days.  Overall long-term prognosis poor.     SUMMARY OF RECOMMENDATIONS   Remain DNR Continue to treat the treatable Continue to monitor hands/ischemia Further plan/options for her hands per vascular surgery PMT will continue to follow and support Continued emotional and spiritual support of patient and family  Symptom Management:  Per primary team PMT is available to assist as needed  Code Status: DNR  Prognosis: Unable to determine  Discharge Planning: To Be Determined  Discussed with: Patient, patient's family, medical team, nursing team  Thank you for allowing [place] to participate in the care of Nicole Cunningham PMT will continue to support  holistically.  Billing based on MDM: High  Problems Addressed: One acute or chronic illness or injury that poses a threat to life or bodily function  Amount and/or Complexity of Data: Category 3:Discussion of management or test interpretation with external physician/other qualified health care professional/appropriate source (not separately reported)  Risks: Decision not to resuscitate or to de-escalate care because of poor prognosis (Remains DNR)   Walden Field, NP Palliative Medicine Team  Team Phone # [phone] (Nights/Weekends)  11/22/2020, 8:17 AM

## 2022-02-16 DIAGNOSIS — Z978 Presence of other specified devices: Secondary | ICD-10-CM

## 2022-02-16 LAB — BASIC METABOLIC PANEL
Anion gap: 11 (ref 5–15)
BUN: 37 mg/dL — ABNORMAL HIGH (ref 8–23)
CO2: 17 mmol/L — ABNORMAL LOW (ref 22–32)
Calcium: 8.2 mg/dL — ABNORMAL LOW (ref 8.9–10.3)
Chloride: 116 mmol/L — ABNORMAL HIGH (ref 98–111)
Creatinine, Ser: 1.09 mg/dL — ABNORMAL HIGH (ref 0.44–1.00)
GFR, Estimated: 57 mL/min — ABNORMAL LOW (ref >60)
Glucose, Bld: 120 mg/dL — ABNORMAL HIGH (ref 70–99)
Potassium: 3.9 mmol/L (ref 3.5–5.1)
Sodium: 144 mmol/L (ref 135–145)

## 2022-02-16 LAB — CBC
HCT: 22.1 % — ABNORMAL LOW (ref 36.0–46.0)
Hemoglobin: 7.2 g/dL — ABNORMAL LOW (ref 12.0–15.0)
MCH: 26.7 pg (ref 26.0–34.0)
MCHC: 32.6 g/dL (ref 30.0–36.0)
MCV: 81.9 fL (ref 80.0–100.0)
Platelets: 275 10*3/uL (ref 150–400)
RBC: 2.7 MIL/uL — ABNORMAL LOW (ref 3.87–5.11)
WBC: 6.9 10*3/uL (ref 4.0–10.5)
nRBC: 0.3 % — ABNORMAL HIGH (ref 0.0–0.2)

## 2022-02-16 LAB — GLUCOSE, CAPILLARY
Glucose-Capillary: 106 mg/dL — ABNORMAL HIGH (ref 70–99)
Glucose-Capillary: 109 mg/dL — ABNORMAL HIGH (ref 70–99)
Glucose-Capillary: 109 mg/dL — ABNORMAL HIGH (ref 70–99)
Glucose-Capillary: 112 mg/dL — ABNORMAL HIGH (ref 70–99)
Glucose-Capillary: 114 mg/dL — ABNORMAL HIGH (ref 70–99)
Glucose-Capillary: 89 mg/dL (ref 70–99)

## 2022-02-16 LAB — HEPARIN LEVEL (UNFRACTIONATED): Heparin Unfractionated: 0.58 IU/mL (ref 0.30–0.70)

## 2022-02-16 MED ORDER — VITAL 1.5 CAL PO LIQD
1000.0000 mL | ORAL | Status: DC
  Filled 2022-02-16: qty 1000

## 2022-02-16 MED ORDER — VITAL 1.5 CAL PO LIQD
1000.0000 mL | ORAL | Status: DC
  Administered 2022-02-16 – 2022-02-19 (×4): 1000 mL
  Filled 2022-02-16 (×7): qty 1000

## 2022-02-16 MED ORDER — ENSURE ENLIVE PO LIQD
237.0000 mL | Freq: Two times a day (BID) | ORAL | Status: DC
  Administered 2022-02-16 – 2022-02-19 (×6): 237 mL via ORAL

## 2022-02-16 NOTE — Progress Notes (Addendum)
Daily Progress Note   Patient Name: Nicole Cunningham       Date: 02/16/2022 DOB: 08-Nov-1960  Age: 61 y.o. MRN#: [id number] Attending Physician: Darliss Cheney, MD Primary Care Physician: [name], MD Admit Date: 01/28/2022 Length of Stay: 18 days  Reason for Consultation/Follow-up: Establishing goals of care  HPI/Patient Profile:  61 y.o. female  with past medical history of COPD, anemia, HTN presents to [facility] ED on 11/6 with AMS. The patient recently admitted to [facility] with AMS on 11/2021, found to have UTI and pyelonephritis during that admission.  On 11/5, patient having increased confusion.  Having some SOB over the past couple of days.  Brought to [facility] ED for further eval. Upon arrival to [facility] ED, patient confused but able to state name.  Admitted with shock, AKI, lactic acidosis of 9.0  She was found to have E. coli bacteremia and EF 20 to 25% on echo, mixed cardiogenic and septic shock. 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 Vascular surgery recommending heparin.   PMT was consulted for [place] conversations.  Subjective:   Subjective: Chart Reviewed. Updates received. Patient Assessed. Created space and opportunity for patient  and family to explore thoughts and feelings regarding current medical situation.  Today's Discussion: Today I met with the patient at the bedside np family was present. She is awake, alert, still with a weak voice, but able to communicate and apparent she understands her medical situation. I feel she has capacity to make decisions at this time.  We discussed how she is doing overall. She still has pain in bilateral hands, which we discussed is an effect of tissue death. Pain medication does help with her pain. No other pain. She did have some nausea this morning, feels nausea medication (Zofran) has helped.   We discussed her lack of appetite. She states she just isn't hungry. I offered that SLP has upgraded her diet (dysphagia 2) which may offer more  appetizing options. However, we discussed that she needs to eat if she wants to continue aggressive care. We discussed that the [employer] is providing some nutrition, but this is only a temporary tube and cannot stay in longer than 1-2 weeks. We discussed if she would ever want a feeding tube, sharing my conversation with her husband in the ICU that she is a very independent person (hence decision at that time for no trach/long term vent). She agrees she is independent and would not want to live in a faciliy indefinitely, be reliant on artificial means to survive. Given this she states she would not want a PEG tube.  We again discussed her hands. I re-enforced vascular surgery opinion that she will most likely need amputation of bilateral fingers/hands if she continues to want aggressive care. She was tearful at this idea. After discussion she shares that she would not want to live without her hands. However, she seems to struggle with the options of amputation versus death. She became quite emotional. I asked permission to speak with her husband about this and she declined for me to call him. I reminded her we will need to make decisions about this in the coming days which would necessitate him being part of the conversations. She agrees with this, but again asks me not to call him about this today.  If there is an emergent change in her hands that will be dealt with emergently by vascular surgery.  However, she appears to indicate she would not want to live without her hands.  I expressed the need to discuss this with her husband, and her, in a family meeting so everyone is on the same page. She does not want me to call him today. Otherwise I will continue to have discussions with patient/family and allow them to process and come to a decision.  I provided emotional and general support through therapeutic listening, empathy, sharing of stories, therapeutic touch, and other techniques. I answered all questions  and addressed all concerns to the best of my ability.  Review of Systems  Respiratory:  Negative for cough and shortness of breath.   Cardiovascular:  Negative for chest pain.  Gastrointestinal:  Negative for nausea and vomiting.  Musculoskeletal:        Bilateral hand pain    Objective:   Vital Signs:  BP (!) 114/53 (BP Location: Left Leg)   Pulse (!) 109   Temp 98.7 F (37.1 C) (Oral)   Resp 20   Ht _0  (1.626 m)   Wt 59.1 kg   SpO2 100%   BMI 22.36 kg/m   Physical Exam: Physical Exam Vitals and nursing note reviewed.  Constitutional:      General: She is not in acute distress.    Appearance: She is ill-appearing.  HENT:     Head: Normocephalic and atraumatic.  Cardiovascular:     Rate and Rhythm: Normal rate.  Pulmonary:     Effort: Pulmonary effort is normal. No respiratory distress.  Abdominal:     General: Abdomen is flat. Bowel sounds are normal.     Palpations: Abdomen is soft.  Skin:    General: Skin is warm and dry.     Comments: Darkened apparent gangrene to bilateral hands/fingers with discoloration extending to mid-forearm  Neurological:     General: No focal deficit present.     Mental Status: She is alert.  Psychiatric:        Mood and Affect: Mood normal.        Behavior: Behavior normal.     Palliative Assessment/Data: 30%    Existing Vynca/ACP Documentation: GOC document (completed today 02/16/2022)  Assessment & Plan:   Impression: Present on Admission:  Septic shock ([place])  Sepsis ([place])  61 year old female with chronic comorbidities and acute presentations as described above.  She has poor functional status as an outpatient, COPD.  She had mixed septic and cardiogenic shock with AKI, pancytopenia, septic encephalopathy, multifocal pneumonia requiring pressors and eventually will need a left and right heart cath.  She also has limb ischemia to bilateral hands and was unable to start heparin due to pancytopenia initially, although  heparin was eventually started. She was intubated for approximately 10 days, successful extubation (planned one-way with no intent to reintubate).  She is overall clinically improved.  Big issue at this point is dry gangrene to all UE digits, ischemia to hands extending with skin discoloration up to the mid forearm.  Vascular surgery is on board and recommending heparin, planned hand surgery eval before d/c if continued aggressive care. She seems to struggle with options of amputation (loss of independence) versus death. Declines for me to call/discuss with husband today.  She agrees she would never want a trach/PEG. Overall long-term prognosis poor.     SUMMARY OF RECOMMENDATIONS   Remain DNR No trach, no PEG Continue to treat the treatable otherwise If she again declines, husband agreeable to transition to comfort Continue to monitor hands/ischemia per vascular Continue discussions in attempt to come to a decision as a family PMT  will continue to follow and support Continued emotional and spiritual support of patient and family  Symptom Management:  Per primary team PMT is available to assist as needed  Code Status: DNR  Prognosis: Unable to determine  Discharge Planning: To Be Determined  Discussed with: Patient, patient's family, medical team, nursing team  Thank you for allowing [place] to participate in the care of DALANA PFAHLER PMT will continue to support holistically.  Billing based on MDM: High  Problems Addressed: One acute or chronic illness or injury that poses a threat to life or bodily function  Amount and/or Complexity of Data: Category 3:Discussion of management or test interpretation with external physician/other qualified health care professional/appropriate source (not separately reported)  Risks: Decision not to resuscitate or to de-escalate care because of poor prognosis (Confirmed DNR)   ADDENDUM: Received a message from the patient's husband requesting I call  for a question. He noted some temporary confusion yesterday and was concerned she's septic again. I told him she seems ok to me today, intermittent confusion is common in severe illness and prolonged hospitalization. He states he's resting today and thinking about our discussion yesterday, plans to come to the hospital tomorrow around 9. I told him I would be thre around 9:30 or 10:00 for [place] to meet together again to talk more about plans moving forward and options for care.   Walden Field, NP Palliative Medicine Team  Team Phone # [phone] (Nights/Weekends)  11/22/2020, 8:17 AM

## 2022-02-16 NOTE — Progress Notes (Signed)
PROGRESS NOTE    Nicole Cunningham  QMV:[id number] DOB: 02/14/1961 DOA: 01/28/2022 PCP: [name], MD   Brief Narrative:  Patient is a 62 old female with history of COPD, anemia, hypertension who was brought to the emergency department on 11/6 with altered mental status, shortness of breath.  On presentation, lab work showed creatinine of 4.1, lactic acid of 9.  CT head, chest x-ray did not show any acute findings.  CT abdomen/pelvis showed 17 mm nonobstructing renal calculus without hydronephrosis.  Patient was admitted for the management of septic shock under ICU service.  Patient had a prolonged hospital course, found to have E. coli bacteremia, echo showed EF of 20 to 25% for which cardiology consulted.  Patient was managed for cardiogenic/septic shock.  Transferred to [facility] service on 11/18.   Significant events as per PCCM :   11/6 septic shock on levo , ETT >> 11/6 Echo EF 20 to 25% global hypokinesis, apex intact 11/6 RIJ HD cath >> dobutamine at 5 mics 11/7>>Dobutamine increased to 7.5 mics , 1 unit PRBC and 1 unit platelet transfusion 11/8 dobutamine increased to 10 mics 11/10 dobut wean, diuretic, pigtail  11/13 restarted levophed overnight , Short run of SVT, weaned Dobutamine to 3 , repleted K, 1 Unit PRBC per cards ( HGB 7.2)  Assessment & Plan:   Principal Problem:   Septic shock (HCC) Active Problems:   Sepsis (HCC)   Acute encephalopathy   Pressure injury of skin  Shock: Mixed cardiogenic and septic shock.  Suspicion for sepsis cardiomyopathy.  Heart failure team following, plan for cardiac cath when appropriate.  Dobutamine stopped.  Off vasopressors. Remains in sinus tachycardia with stable blood pressure.  Monitor on telemetry.  Repeat echo shows 70% ejection fraction/hyperdynamic left ventricular.  No plans for cardiac cath.  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  Dysphagia: Patient underwent MBS on 02/14/2022 and was started on dysphagia 1 diet, SLP  following, advance to dysphagia 2 diet today.   Bacteremia: Blood culture/urine culture showed E. coli.  Patient completed 10 days of Merrem antibiotics.   AKI: Likely from ATN from shock. She is getting some IV fluids ordered by cardiology, her creatinine has improved.   Pancytopenia/microcytic anemia.: Thought to be sepsis related.  She has thus far received 4 units of PRBC transfusion and 2 units of platelet transfusion during this hospitalization.  Hemoglobin dropped to the range of 6 and she received 1 unit of PRBC transfusion on 02/11/2022.  Hemoglobin stable since then.  Monitor CBC daily.  Hemoglobin 7.2, will transfuse if drops less than 7.  Repeat in the morning.   Acute hypoxic respiratory failure secondary to multifocal pneumonia: History of COPD.  Currently on Brovana, Pulmicort, DuoNeb. currently on 2 L of oxygen via nasal cannula.  Tracheal aspirate has shown Enterobacter cloacae.  Continue current antibiotics. Patient was also found to have left parapneumonic effusion.  Status post pigtail placement, now has been removed.  Extubated on 11/16 with no intention to intubate.   Acute systolic CHF: Heart failure team following.  Echo showed EF of 20 to 25% with severe hypokinesis, normal RV, dilated IVC.  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  She is getting some IV fluids again today.  Cardiology managing.   Septic encephalopathy:   CT head on presentation did not show any acute findings.  Currently unable to swallow, speech therapy following and recommending n.p.o.  Currently  on tube feeding.  Speech therapy should continue to follow and evaluate her swallowing so that the feeding can be discontinued.  Started on Seroquel for agitation by PCCM team.  Might need to be titrated. Mental status has significantly improved and she is currently alert and oriented.  Follows commands.      Upper extremity ischemia with dry gangrene: Present on admission.  Worse on the right side.  Also suspected to have worsening from pressures causing vaso constriction.Vascular surgery following.  Currently on low-dose heparin.  No plan for intervention.  Rx for autoamputation.  Vascular surgery following.  Per them, her hands are nonsalvageable and patient may need evaluation by hand surgery at some point in time however patient carries very poor prognosis with multiple comorbidities.   Hypokalemia/hypernatremia: Sodium and potassium both normalized, reduced free water boluses frequency from every 4 hours to every 8 hours.   Nonobstructing ureteral stone: We recommend follow-up with urology as an outpatient.  Thrombocytopenia: Likely sepsis driven.  Improved.   Goals of care/deconditioning/debility: Palliative care closely following.  Currently DNR.  Needs to continue discussing goals of care.  PT/OT recommending skilled facility on discharge.  After discussion with family, they are agreeable for comfort care if she declines further.  I have reached out to the palliative care, I and vascular surgery both believe that patient has significantly poor prognosis due to multiple comorbidities and she really is appropriate for hospice.  Will defer to palliative care for those discussions with the family.   DVT prophylaxis: Place and maintain sequential compression device Start: 01/29/22 1021   Code Status: DNR  Family Communication:  None present at bedside.  Status is: Inpatient Remains inpatient appropriate because: Patient very sick.     Estimated body mass index is 22.36 kg/m as calculated from the following:   Height as of this encounter:  (1.626 m).   Weight as of this encounter: 59.1 kg.  Pressure Injury 02/08/22 Buttocks Right Stage 2 -  Partial thickness loss of dermis presenting as a shallow open injury with a red, pink wound bed without slough. (Active)  02/08/22 0800  Location:  Buttocks  Location Orientation: Right  Staging: Stage 2 -  Partial thickness loss of dermis presenting as a shallow open injury with a red, pink wound bed without slough.  Wound Description (Comments):   Present on Admission:   Dressing Type Foam - Lift dressing to assess site every shift 02/16/22 0754   Nutritional Assessment: Body mass index is 22.36 kg/m.Marland Kitchen Seen by dietician.  I agree with the assessment and plan as outlined below: Nutrition Status: Nutrition Problem: Severe Malnutrition Etiology: chronic illness (COPD) Signs/Symptoms: severe muscle depletion, severe fat depletion Interventions: Tube feeding  . Skin Assessment: I have examined the patient's skin and I agree with the wound assessment as performed by the wound care RN as outlined below: Pressure Injury 02/08/22 Buttocks Right Stage 2 -  Partial thickness loss of dermis presenting as a shallow open injury with a red, pink wound bed without slough. (Active)  02/08/22 0800  Location: Buttocks  Location Orientation: Right  Staging: Stage 2 -  Partial thickness loss of dermis presenting as a shallow open injury with a red, pink wound bed without slough.  Wound Description (Comments):   Present on Admission:   Dressing Type Foam - Lift dressing to assess site every shift 02/16/22 0754    Consultants:  Cardiology Vascular surgery Palliative care Procedures:  As above  Antimicrobials:  Anti-infectives (From admission,  onward)    Start     Dose/Rate Route Frequency Ordered Stop   02/04/22 1345  meropenem (MERREM) 1 g in sodium chloride 0.9 % 100 mL IVPB        1 g 200 mL/hr over 30 Minutes Intravenous Every 12 hours 02/04/22 1258 02/13/22 2350   02/01/22 2200  ceFAZolin (ANCEF) IVPB 2g/100 mL premix  Status:  Discontinued        2 g 200 mL/hr over 30 Minutes Intravenous Every 12 hours 02/01/22 0754 02/04/22 1258   01/29/22 2200  ceFEPIme (MAXIPIME) 1 g in sodium chloride 0.9 % 100 mL IVPB  Status:  Discontinued         1 g 200 mL/hr over 30 Minutes Intravenous Every 24 hours 01/29/22 0046 01/29/22 0242   01/29/22 2200  cefTRIAXone (ROCEPHIN) 2 g in sodium chloride 0.9 % 100 mL IVPB  Status:  Discontinued        2 g 200 mL/hr over 30 Minutes Intravenous Every 24 hours 01/29/22 0242 02/01/22 0754   01/29/22 0048  vancomycin variable dose per unstable renal function (pharmacist dosing)  Status:  Discontinued         Does not apply See admin instructions 01/29/22 0048 01/29/22 1343   01/28/22 2230  ceFEPIme (MAXIPIME) 2 g in sodium chloride 0.9 % 100 mL IVPB        2 g 200 mL/hr over 30 Minutes Intravenous  Once 01/28/22 2228 01/28/22 2350   01/28/22 2230  metroNIDAZOLE (FLAGYL) IVPB 500 mg        500 mg 100 mL/hr over 60 Minutes Intravenous  Once 01/28/22 2228 01/29/22 0021   01/28/22 2230  vancomycin (VANCOCIN) IVPB 1000 mg/200 mL premix        1,000 mg 200 mL/hr over 60 Minutes Intravenous  Once 01/28/22 2228 01/29/22 0150         Subjective:  Seen and examined.  She has no complaints other than bilateral hand pain.  Objective: Vitals:   02/16/22 0431 02/16/22 0434 02/16/22 0754 02/16/22 0856  BP:   (!) 114/53   Pulse: (!) 107 87 (!) 109   Resp: (!) 24  20   Temp:   98.7 F (37.1 C)   TempSrc:   Oral   SpO2: 99% 97% 100% 100%  Weight: 59.1 kg     Height:        Intake/Output Summary (Last 24 hours) at 02/16/2022 1027 Last data filed at 02/16/2022 0800 Gross per 24 hour  Intake 480 ml  Output 1000 ml  Net -520 ml   Filed Weights   02/14/22 0359 02/15/22 0406 02/16/22 0431  Weight: 58.1 kg 56.5 kg 59.1 kg    Examination:  General exam: Appears calm and comfortable  Respiratory system: Clear to auscultation. Respiratory effort normal. Cardiovascular system: S1 & S2 heard, RRR. No JVD, murmurs, rubs, gallops or clicks.  +3 pitting edema bilateral lower extremity, +2 pitting edema bilateral upper extremities. Gastrointestinal system: Abdomen is nondistended, soft and  nontender. No organomegaly or masses felt. Normal bowel sounds heard. Central nervous system: Alert and oriented. No focal neurological deficits. Extremities: Symmetric 5 x 5 power.  Gangrenous fingers both hands Skin: No rashes, lesions or ulcers.   Data Reviewed: I have personally reviewed following labs and imaging studies  CBC: Recent Labs  Lab 02/12/22 0420 02/13/22 0340 02/14/22 0410 02/15/22 0500 02/16/22 0400  WBC 14.2* 11.7* 9.3 7.4 6.9  HGB 8.1* 7.9* 7.6* 7.3* 7.2*  HCT 23.9* 23.5* 23.2*  22.4* 22.1*  MCV 78.4* 79.1* 81.7 82.1 81.9  PLT 243 278 281 258 275   Basic Metabolic Panel: Recent Labs  Lab 02/12/22 0420 02/13/22 0340 02/14/22 0410 02/15/22 0500 02/16/22 0400  NA 154* 149* 144 141 144  K 3.7 3.6 3.9 4.0 3.9  CL 126* 124* 122* 116* 116*  CO2 16* 14* 15* 17* 17*  GLUCOSE 151* 138* 124* 113* 120*  BUN 51* 52* 49* 42* 37*  CREATININE 1.31* 1.37* 1.39* 1.23* 1.09*  CALCIUM 8.4* 8.3* 7.8* 8.1* 8.2*   GFR: Estimated Creatinine Clearance: 45.6 mL/min (A) (by C-G formula based on SCr of 1.09 mg/dL (H)). Liver Function Tests: No results for input(s): "AST", "ALT", "ALKPHOS", "BILITOT", "PROT", "ALBUMIN" in the last 168 hours.  No results for input(s): "LIPASE", "AMYLASE" in the last 168 hours. No results for input(s): "AMMONIA" in the last 168 hours. Coagulation Profile: No results for input(s): "INR", "PROTIME" in the last 168 hours. Cardiac Enzymes: No results for input(s): "CKTOTAL", "CKMB", "CKMBINDEX", "TROPONINI" in the last 168 hours. BNP (last 3 results) No results for input(s): "PROBNP" in the last 8760 hours. HbA1C: No results for input(s): "HGBA1C" in the last 72 hours. CBG: Recent Labs  Lab 02/15/22 1700 02/15/22 2013 02/16/22 0006 02/16/22 0358 02/16/22 0752  GLUCAP 102* 97 106* 109* 112*   Lipid Profile: No results for input(s): "CHOL", "HDL", "LDLCALC", "TRIG", "CHOLHDL", "LDLDIRECT" in the last 72 hours. Thyroid Function  Tests: No results for input(s): "TSH", "T4TOTAL", "FREET4", "T3FREE", "THYROIDAB" in the last 72 hours. Anemia Panel: No results for input(s): "VITAMINB12", "FOLATE", "FERRITIN", "TIBC", "IRON", "RETICCTPCT" in the last 72 hours. Sepsis Labs: No results for input(s): "PROCALCITON", "LATICACIDVEN" in the last 168 hours.  Recent Results (from the past 240 hour(s))  Culture, Respiratory w Gram Stain     Status: None   Collection Time: 02/06/22 10:56 AM   Specimen: Tracheal Aspirate; Respiratory  Result Value Ref Range Status   Specimen Description TRACHEAL ASPIRATE  Final   Special Requests NONE  Final   Gram Stain   Final    NO WBC SEEN NO ORGANISMS SEEN Performed at [facility], [address], [place], [place] [address]    Culture MODERATE ENTEROBACTER CLOACAE  Final   Report Status 02/08/2022 FINAL  Final   Organism ID, Bacteria ENTEROBACTER CLOACAE  Final      Susceptibility   Enterobacter cloacae - MIC*    CEFAZOLIN >=64 RESISTANT Resistant     CEFEPIME 2 SENSITIVE Sensitive     CEFTAZIDIME >=64 RESISTANT Resistant     CIPROFLOXACIN <=0.25 SENSITIVE Sensitive     GENTAMICIN <=1 SENSITIVE Sensitive     IMIPENEM <=0.25 SENSITIVE Sensitive     TRIMETH/SULFA <=20 SENSITIVE Sensitive     PIP/TAZO >=128 RESISTANT Resistant     * MODERATE ENTEROBACTER CLOACAE     Radiology Studies: DG Swallowing Func-Speech Pathology  Result Date: 02/14/2022 Table formatting from the original result was not included. Objective Swallowing Evaluation: Type of Study: MBS-Modified Barium Swallow Study  Patient Details Name: Nicole Cunningham MRN: [id number] Date of Birth: 1960-06-28 Today's Date: 02/14/2022 Time: SLP Start Time (ACUTE ONLY): 1310 -SLP Stop Time (ACUTE ONLY): 1330 SLP Time Calculation (min) (ACUTE ONLY): 20 min Past Medical History: Past Medical History: Diagnosis Date  Bronchitis   COPD (chronic obstructive pulmonary disease) (HCC)   Hypertension  Past Surgical History: No past  surgical history on file. HPI: Patient is a 61 year old female who presented [facility] ED on 11/6 with  AMS, UTI and pyelonephritis, cardiogenic and septic shock, AKI, acute hypoxic respiratory failure due to multifocal pna caused by ESBL enterobacter, L parapneumonic effusion, bilateral finger dry gangrene.  ETT 11/6-11/16. PMH of COPD, anemia, HTN. Failed Yale swallow screen after extubation.  Subjective: alert  Recommendations for follow up therapy are one component of a multi-disciplinary discharge planning process, led by the attending physician.  Recommendations may be updated based on patient status, additional functional criteria and insurance authorization. Assessment / Plan / Recommendation   02/14/2022   1:49 PM Clinical Impressions Clinical Impression Pt presents with oropharyngeal dysphagia characterized by impaired mastication, weak lingual manipulation, impaired posterior propulsion, reduced anterior laryngeal movement, and a pharyngeal delay. She demonstrated prolonged mastication, difficulty with A-P transport, pyriform sinus residue and incomplete epiglottic inversion. Penetration (PAS 2,3) was noted with thin liquids and with nectar thick liquids. Aspiration (PAS 8) was observed with the initial two swallows of thin liquids via cup and straw secondary to the pharyngeal delay and spillover of residue in the pyriform sinuses. Very delayed weak coughing was inconsistently noted after aspiration. Prompted coughing mobilized aspirate above the vocal folds, but was ineffective in expelling it from the larynx. Laryngeal invasion worsened with use of a chin tuck posture. With prompts (i.e., "swallow hard" or "squeeze your muscles when you swallow") for an effortful swallow, penetration and aspiration were eliminated, but even with swallows that appeared less effortful, penetration (PAS 2) was WNL. A dysphagia 1 diet with thin liquids is recommended with observance of swallowing precautions. SLP Visit  Diagnosis Dysphagia, oropharyngeal phase (R13.12) Impact on safety and function Mild aspiration risk     02/14/2022   1:49 PM Treatment Recommendations Treatment Recommendations Therapy as outlined in treatment plan below     02/14/2022   1:49 PM Prognosis Prognosis for Safe Diet Advancement Good   02/14/2022   1:49 PM Diet Recommendations SLP Diet Recommendations Dysphagia 1 (Puree) solids;Thin liquid Liquid Administration via Cup;Straw Medication Administration Crushed with puree Compensations Slow rate;Small sips/bites;Effortful swallow Postural Changes Seated upright at 90 degrees     02/14/2022   1:49 PM Other Recommendations Oral Care Recommendations Oral care BID Follow Up Recommendations Skilled nursing-short term rehab (<3 hours/day) Functional Status Assessment Patient has had a recent decline in their functional status and demonstrates the ability to make significant improvements in function in a reasonable and predictable amount of time.   02/14/2022   1:49 PM Frequency and Duration  Speech Therapy Frequency (ACUTE ONLY) min 2x/week Treatment Duration 2 weeks     02/14/2022   1:49 PM Oral Phase Oral Phase Impaired Oral - Nectar Straw Weak lingual manipulation;Reduced posterior propulsion Oral - Thin Cup Weak lingual manipulation;Reduced posterior propulsion Oral - Thin Straw Weak lingual manipulation;Reduced posterior propulsion Oral - Puree Weak lingual manipulation;Reduced posterior propulsion Oral - Regular Weak lingual manipulation;Reduced posterior propulsion;Impaired mastication Oral - Pill Weak lingual manipulation;Reduced posterior propulsion;Impaired mastication    02/14/2022   1:49 PM Pharyngeal Phase Pharyngeal Phase Impaired Pharyngeal- Nectar Cup Delayed swallow initiation-vallecula;Reduced anterior laryngeal mobility;Reduced epiglottic inversion;Pharyngeal residue - pyriform Pharyngeal- Nectar Straw Delayed swallow initiation-vallecula;Reduced anterior laryngeal mobility;Reduced epiglottic  inversion;Pharyngeal residue - pyriform Pharyngeal- Thin Cup Delayed swallow initiation-vallecula;Reduced anterior laryngeal mobility;Reduced epiglottic inversion;Pharyngeal residue - pyriform;Penetration/Aspiration during swallow;Penetration/Apiration after swallow;Delayed swallow initiation-pyriform sinuses Pharyngeal Material enters airway, remains ABOVE vocal cords then ejected out;Material enters airway, remains ABOVE vocal cords and not ejected out;Material enters airway, passes BELOW cords without attempt by patient to eject out (silent aspiration) Pharyngeal- Thin Straw Delayed swallow  initiation-vallecula;Reduced anterior laryngeal mobility;Reduced epiglottic inversion;Pharyngeal residue - pyriform;Penetration/Aspiration during swallow;Penetration/Apiration after swallow;Delayed swallow initiation-pyriform sinuses Pharyngeal Material enters airway, remains ABOVE vocal cords and not ejected out;Material enters airway, remains ABOVE vocal cords then ejected out;Material enters airway, passes BELOW cords without attempt by patient to eject out (silent aspiration) Pharyngeal- Puree Delayed swallow initiation-vallecula;Reduced anterior laryngeal mobility;Reduced epiglottic inversion;Pharyngeal residue - pyriform Pharyngeal- Regular Delayed swallow initiation-vallecula;Reduced anterior laryngeal mobility;Reduced epiglottic inversion;Pharyngeal residue - pyriform    02/14/2022   1:49 PM Cervical Esophageal Phase  Cervical Esophageal Phase [facility] Shanika I. Vear Clock, MS, CCC-SLP Acute Rehabilitation Services Office number [phone] Scheryl Marten 02/14/2022, 2:38 PM                      Scheduled Meds:  acetaminophen  1,000 mg Per Tube TID   amLODipine  5 mg Oral Daily   arformoterol  15 mcg Nebulization BID   budesonide (PULMICORT) nebulizer solution  0.25 mg Nebulization BID   carvedilol  3.125 mg Oral BID WC   Chlorhexidine Gluconate Cloth  6 each Topical Q0600   docusate  100 mg Per Tube BID    FLUoxetine  20 mg Per Tube Daily   free water  300 mL Per Tube Q8H   insulin aspart  0-6 Units Subcutaneous Q4H   mouth rinse  15 mL Mouth Rinse 4 times per day   polyethylene glycol  17 g Per Tube Daily   QUEtiapine  50 mg Per Tube BID   sodium chloride flush  10-40 mL Intracatheter Q12H   Continuous Infusions:  sodium chloride 10 mL/hr at 02/12/22 0600   sodium chloride Stopped (02/09/22 2311)   feeding supplement (VITAL 1.5 CAL) 1,000 mL (02/14/22 1134)   heparin 1,200 Units/hr (02/15/22 1005)     LOS: 18 days   Hughie Closs, MD [facility]  02/16/2022, 10:27 AM   *Please note that this is a verbal dictation therefore any spelling or grammatical errors are due to the "Dragon [employer] One" system interpretation.  Please page via Amion and do not message via secure chat for urgent patient care matters. Secure chat can be used for non urgent patient care matters.  How to contact the [facility] Attending or Consulting provider 7A - 7P or covering provider during after hours 7P -7A, for this patient?  Check the care team in [facility] and look for a) attending/consulting TRH provider listed and b) the [facility] team listed. Page or secure chat 7A-7P. Log into [email or web address] and use [place]'s universal password to access. If you do not have the password, please contact the hospital operator. Locate the [facility] provider you are looking for under [facility] and page to a number that you can be directly reached. If you still have difficulty reaching the provider, please page the [facility] (Director on Call) for the Hospitalists listed on amion for assistance.

## 2022-02-16 NOTE — Progress Notes (Signed)
Speech Language Pathology Treatment: Dysphagia  Patient Details Name: Nicole Cunningham MRN: [id number] DOB: 11/22/1960 Today's Date: 02/16/2022 Time: 0920-0932 SLP Time Calculation (min) (ACUTE ONLY): 12 min  Assessment / Plan / Recommendation Clinical Impression  Pt was seen for dysphagia treatment. Pt's RN reported that the pt's p.o. intake has been limited and that she often reports feeling too full to eat. SLP questions whether TFs can be adjusted to help with this. Pt tolerated regular texture solids and thin liquids via straw without overt s/s of aspiration. Mastication was moderate-severely prolonged with regular texture solids and mild lingual residue was cleared with a liquid wash. A dysphagia 2 diet with thin liquids is recommended and SLP will continue to follow pt.     HPI HPI: Patient is a 61 year old female who presented to [facility] ED on 11/6 with AMS, UTI and pyelonephritis, cardiogenic and septic shock, AKI, acute hypoxic respiratory failure due to multifocal pna caused by ESBL enterobacter, L parapneumonic effusion, bilateral finger dry gangrene.  ETT 11/6-11/16. PMH of COPD, anemia, HTN. Failed [facility] swallow screen after extubation.      SLP Plan  Continue with current plan of care      Recommendations for follow up therapy are one component of a multi-disciplinary discharge planning process, led by the attending physician.  Recommendations may be updated based on patient status, additional functional criteria and insurance authorization.    Recommendations  Diet recommendations: Dysphagia 2 (fine chop);Thin liquid Liquids provided via: Cup;Straw Medication Administration: Crushed with puree Supervision: Staff to assist with self feeding Compensations: Slow rate;Small sips/bites;Effortful swallow Postural Changes and/or Swallow Maneuvers: Seated upright 90 degrees                Oral Care Recommendations: Oral care BID Follow Up Recommendations: Skilled nursing-short  term rehab (<3 hours/day) SLP Visit Diagnosis: Dysphagia, oropharyngeal phase (R13.12) Plan: Continue with current plan of care         Marvina Danner I. Vear Clock, MS, CCC-SLP Acute Rehabilitation Services Office number [phone]  Scheryl Marten  02/16/2022, 9:41 AM

## 2022-02-16 NOTE — Progress Notes (Signed)
ANTICOAGULATION CONSULT NOTE - Follow Up Consult  Pharmacy Consult for Heparin Indication: ischemic digits  No Active Allergies  Patient Measurements: Height: 5\' 4"  (162.6 cm) Weight: 59.1 kg (130 lb 4.7 oz) IBW/kg (Calculated) : 54.7 Heparin Dosing Weight: 48 kg  Vital Signs: Temp: 98.7 F (37.1 C) (11/24 0355) Temp Source: Oral (11/24 0355) BP: 115/55 (11/24 0355) Pulse Rate: 87 (11/24 0434)  Labs: Recent Labs    02/14/22 0410 02/15/22 0500 02/16/22 0400 02/16/22 0410  HGB 7.6* 7.3* 7.2*  --   HCT 23.2* 22.4* 22.1*  --   PLT 281 258 275  --   HEPARINUNFRC 0.43 0.54  --  0.58  CREATININE 1.39* 1.23* 1.09*  --      Estimated Creatinine Clearance: 45.6 mL/min (A) (by C-G formula based on SCr of 1.09 mg/dL (H)).   Medical History: Past Medical History:  Diagnosis Date   Bronchitis    COPD (chronic obstructive pulmonary disease) (HCC)    Hypertension     Assessment: 61 yo female presented on 11/6 with AMS found to have e coli bacteremia and mixed cardiogenic/septic shock. Course complicated by ischemic fingers and vascular consulted but unable to use IV heparin due to thrombocytopenia. Noted dark tarry stools and noted somewhat bloody smears on 11/13 per RN. Pharmacy consulted to dose heparin with low goal and no bolus now that plts >50.  Platelet count has improved so will begin utilizing normal goal heparin (0.3-0.7). Heparin level therapeutic at 0.58.    Goal of Therapy:  Heparin level 0.3-0.5 units/ml Monitor platelets by anticoagulation protocol: Yes   Plan:  Continue heparin 1200 units/h Daily heparin level and CBC  12/13, PharmD, [place], [facility] Clinical Pharmacist [phone] Please check AMION for all [facility] Pharmacy numbers 02/16/2022

## 2022-02-16 NOTE — Progress Notes (Signed)
Nutrition Follow-up  DOCUMENTATION CODES:  Underweight, Severe malnutrition in context of chronic illness  INTERVENTION:  Provide diet per SLP recommendations: Dysphagia 2 w/ thin liquids Provide Ensure Enlive BID per pt preference (350kcal, 20g protein) Transition EN via cortrak to nocturnal feeding to allow pt to feel hungry: Vital 1.5 @ 48mL/hr x 12hrs ( total volume) to provide 1080kcal, 49g protein and free water Run EN from 1800-0600   NUTRITION DIAGNOSIS:  Severe Malnutrition related to chronic illness (COPD) as evidenced by severe muscle depletion, severe fat depletion.  GOAL:  Patient will meet greater than or equal to 90% of their needs progressing  MONITOR:  Diet advancement, Labs, Weight trends, TF tolerance, Skin, I & O's  REASON FOR ASSESSMENT:  Consult Enteral/tube feeding initiation and management (trickle tube feeds)  ASSESSMENT:  61 year old female who presented to the ED on 11/05 with AMS. PMH of COPD, anemia, HTN. Pt admitted with septic shock, AKI.  Diet advanced to Dysphagia 2 w/ thin liquids this AM by SLP. Visited pt at bedside who reports she is not feeling hungry due to tube feeding via cortrak. Discussed with pt adjusting tube feeding to run only at night to allow her to feel hungry during the day. Explained that getting adequate nutrition is important so I would recommend continuing until her po intake improves. Pt agreeable. Recommend Vital 1.5 at 23mL/hr x 12hrs ( total volume) to provide 1080kcal (64%kcal needs), 49g protein (65% protein needs) and free water. Asked pt about taking Ensure po and she was very excited about this. Recommend Ensure Enlive BID to provide 350kcal and 20g protein/bottle. Ensure and tube feeing will meet pt's minimum estimated nutrient needs. Continue to monitor po intake and adjust nutrition support as clinically appropriate. Made RN aware of changes to nutrition plan.   Diet Order:   Diet Order              DIET DYS 2 Room service appropriate? Yes with Assist; Fluid consistency: Thin  Diet effective now                   EDUCATION NEEDS:  Not appropriate for education at this time  Skin:  Skin Assessment: Reviewed RN Assessment (skin tear to perineum)  Last BM:  11/21  Height:  Ht Readings from Last 1 Encounters:  02/06/22 5\' 4"  (1.626 m)    Weight:  Wt Readings from Last 1 Encounters:  02/16/22 59.1 kg   Ideal Body Weight:  54.5 kg  BMI:  Body mass index is 22.36 kg/m.  Estimated Nutritional Needs:  Kcal:  1700-1900 Protein:  75-90 grams Fluid:  1.7-1.9 L  02/18/22, MS, RD, LDN, CNSC See AMiON for contact information

## 2022-02-17 DIAGNOSIS — I96 Gangrene, not elsewhere classified: Secondary | ICD-10-CM

## 2022-02-17 LAB — GLUCOSE, CAPILLARY
Glucose-Capillary: 113 mg/dL — ABNORMAL HIGH (ref 70–99)
Glucose-Capillary: 115 mg/dL — ABNORMAL HIGH (ref 70–99)
Glucose-Capillary: 119 mg/dL — ABNORMAL HIGH (ref 70–99)
Glucose-Capillary: 129 mg/dL — ABNORMAL HIGH (ref 70–99)
Glucose-Capillary: 86 mg/dL (ref 70–99)
Glucose-Capillary: 94 mg/dL (ref 70–99)

## 2022-02-17 LAB — TYPE AND SCREEN
ABO/RH(D): AB POS
Antibody Screen: NEGATIVE
Unit division: 0

## 2022-02-17 LAB — CBC
HCT: 21.2 % — ABNORMAL LOW (ref 36.0–46.0)
HCT: 21.5 % — ABNORMAL LOW (ref 36.0–46.0)
Hemoglobin: 6.7 g/dL — CL (ref 12.0–15.0)
Hemoglobin: 7 g/dL — ABNORMAL LOW (ref 12.0–15.0)
MCH: 25.8 pg — ABNORMAL LOW (ref 26.0–34.0)
MCH: 26.2 pg (ref 26.0–34.0)
MCHC: 31.6 g/dL (ref 30.0–36.0)
MCHC: 32.6 g/dL (ref 30.0–36.0)
MCV: 81.5 fL (ref 80.0–100.0)
MCV: 80.5 fL (ref 80.0–100.0)
Platelets: 255 10*3/uL (ref 150–400)
Platelets: 272 10*3/uL (ref 150–400)
RBC: 2.6 MIL/uL — ABNORMAL LOW (ref 3.87–5.11)
RBC: 2.67 MIL/uL — ABNORMAL LOW (ref 3.87–5.11)
RDW: 27.3 % — ABNORMAL HIGH (ref 11.5–15.5)
RDW: 26.8 % — ABNORMAL HIGH (ref 11.5–15.5)
WBC: 7.2 10*3/uL (ref 4.0–10.5)
WBC: 7.4 10*3/uL (ref 4.0–10.5)
nRBC: 0 % (ref 0.0–0.2)
nRBC: 0 % (ref 0.0–0.2)

## 2022-02-17 LAB — BPAM RBC
Blood Product Expiration Date: 202312172359
Unit Type and Rh: 8400

## 2022-02-17 LAB — BASIC METABOLIC PANEL
Anion gap: 6 (ref 5–15)
BUN: 31 mg/dL — ABNORMAL HIGH (ref 8–23)
CO2: 18 mmol/L — ABNORMAL LOW (ref 22–32)
Calcium: 8 mg/dL — ABNORMAL LOW (ref 8.9–10.3)
Chloride: 115 mmol/L — ABNORMAL HIGH (ref 98–111)
Creatinine, Ser: 1.03 mg/dL — ABNORMAL HIGH (ref 0.44–1.00)
GFR, Estimated: >60 mL/min (ref >60)
Glucose, Bld: 119 mg/dL — ABNORMAL HIGH (ref 70–99)
Potassium: 4.1 mmol/L (ref 3.5–5.1)
Sodium: 139 mmol/L (ref 135–145)

## 2022-02-17 LAB — HEPARIN LEVEL (UNFRACTIONATED)
Heparin Unfractionated: >1.1 IU/mL — ABNORMAL HIGH (ref 0.30–0.70)
Heparin Unfractionated: >1.1 IU/mL — ABNORMAL HIGH (ref 0.30–0.70)
Heparin Unfractionated: 0.69 IU/mL (ref 0.30–0.70)

## 2022-02-17 LAB — PREPARE RBC (CROSSMATCH)

## 2022-02-17 MED ORDER — HEPARIN (PORCINE) 25000 UT/250ML-% IV SOLN
950.0000 [IU]/h | INTRAVENOUS | Status: DC
  Administered 2022-02-17: 1100 [IU]/h via INTRAVENOUS
  Administered 2022-02-18: 1000 [IU]/h via INTRAVENOUS
  Administered 2022-02-19 – 2022-02-20 (×2): 950 [IU]/h via INTRAVENOUS
  Filled 2022-02-17 (×4): qty 250

## 2022-02-17 MED ORDER — SODIUM CHLORIDE 0.9% IV SOLUTION: Freq: Once | INTRAVENOUS | Status: AC

## 2022-02-17 MED ORDER — ORAL CARE MOUTH RINSE: 15.0000 mL | OROMUCOSAL | Status: DC | PRN

## 2022-02-17 MED ORDER — SODIUM CHLORIDE 0.9% IV SOLUTION: Freq: Once | INTRAVENOUS | Status: DC

## 2022-02-17 NOTE — Progress Notes (Addendum)
1 unit PRBCs ordered to be transfused for hemoglobin of 6.7K.  Will repeat CBC post blood transfusion.

## 2022-02-17 NOTE — Progress Notes (Signed)
ANTICOAGULATION CONSULT NOTE - Follow Up Consult  Pharmacy Consult for Heparin Indication: ischemic digits  No Active Allergies  Patient Measurements: Height: 5\' 4"  (162.6 cm) Weight: 59.1 kg (130 lb 4.7 oz) IBW/kg (Calculated) : 54.7 Heparin Dosing Weight: 48 kg  Vital Signs: Temp: 99 F (37.2 C) (11/25 0435) Temp Source: Oral (11/25 0435) BP: 130/63 (11/25 0435) Pulse Rate: 73 (11/25 0435)  Labs: Recent Labs    02/15/22 0500 02/16/22 0400 02/16/22 0410 02/17/22 0330  HGB 7.3* 7.2*  --  6.7*  HCT 22.4* 22.1*  --  21.2*  PLT 258 275  --  255  HEPARINUNFRC 0.54  --  0.58 >1.10*  CREATININE 1.23* 1.09*  --  1.03*     Estimated Creatinine Clearance: 48.3 mL/min (A) (by C-G formula based on SCr of 1.03 mg/dL (H)).   Medical History: Past Medical History:  Diagnosis Date   Bronchitis    COPD (chronic obstructive pulmonary disease) (HCC)    Hypertension     Assessment: 61 yo female presented on 11/6 with AMS found to have e coli bacteremia and mixed cardiogenic/septic shock. Course complicated by ischemic fingers and vascular consulted but unable to use IV heparin due to thrombocytopenia. Noted dark tarry stools and noted somewhat bloody smears on 11/13 per RN. Pharmacy consulted to dose heparin with low goal and no bolus now that plts >50.  Platelet count has improved so will begin utilizing normal goal heparin (0.3-0.7). Heparin level therapeutic at 0.58.  11/25 AM update:  Heparin level elevated Drawn correctly per RN Hgb 6.7-no bleeding per RN  Goal of Therapy:  Heparin level 0.3-0.5 units/ml Monitor platelets by anticoagulation protocol: Yes   Plan:  Hold heparin x 1 hr Re-start heparin drip at 1100 units/hr at 0600 1400 heparin level  12/25, PharmD, BCPS Clinical Pharmacist Phone: [phone]

## 2022-02-17 NOTE — Progress Notes (Addendum)
Daily Progress Note   Patient Name: Nicole Cunningham       Date: 02/17/2022 DOB: 1961-02-27  Age: 61 y.o. MRN#: [id number] Attending Physician: Darliss Cheney, MD Primary Care Physician: [name], MD Admit Date: 01/28/2022 Length of Stay: 19 days  Reason for Consultation/Follow-up: Establishing goals of care  HPI/Patient Profile:  61 y.o. female  with past medical history of COPD, anemia, HTN presents to [facility] ED on 11/6 with AMS. The patient recently admitted to [facility] with AMS on 11/2021, found to have UTI and pyelonephritis during that admission.  On 11/5, patient having increased confusion.  Having some SOB over the past couple of days.  Brought to [facility] ED for further eval. Upon arrival to [facility] ED, patient confused but able to state name.  Admitted with shock, AKI, lactic acidosis of 9.0  She was found to have E. coli bacteremia and EF 20 to 25% on echo, mixed cardiogenic and septic shock. 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 Vascular surgery recommending heparin.   PMT was consulted for [place] conversations.  Subjective:   Subjective: Chart Reviewed. Updates received. Patient Assessed. Created space and opportunity for patient  and family to explore thoughts and feelings regarding current medical situation.  Today's Discussion: Today I met with the patient at the bedside.  Her husband and her son were both present at the bedside as well.  He engaged in a good conversation regarding goals of care.  She did eat a little bit better yesterday since tube feeds have been transition to nighttime.  However, we discussed there is a difference between eating versus eating enough to sustain oneself.  We discussed that the [employer]Trak is providing some nutrition, but this is only a temporary tube and cannot stay in longer than 1-2 weeks.  I confirmed her desire for no long-term feeding tube.  We again discussed her hands. I re-enforced vascular surgery opinion that she will most likely need  amputation of bilateral fingers/hands if she continues to want aggressive care. After discussion with the patient, her husband, and her son they all agree that she would not want amputation due to the impact on her quality of life.  She is historically been very independent individuals.  She shares that she would not want to live without her hands.  We discussed that in this situation there are not really many treatment options left to save her hands.  This would likely result in infection, sepsis, death.  They understand.    We discussed an option moving forward would be hospice care. I described hospice as a service for patients who have a life expectancy of 6 months or less. The goal of hospice is the preservation of dignity and quality at the end phases of life. Under hospice care, the focus changes from curative to symptom relief.  They are in agreement with consulting hospice for discussion about services that can be provided.  In the meantime, they would like to continue the current scope of care.  I provided emotional and general support through therapeutic listening, empathy, sharing of stories, therapeutic touch, and other techniques. I answered all questions and addressed all concerns to the best of my ability.  Review of Systems  Respiratory:  Negative for cough and shortness of breath.   Cardiovascular:  Negative for chest pain.  Gastrointestinal:  Negative for nausea and vomiting.  Musculoskeletal:        Bilateral hand pain    Objective:   Vital Signs:  BP Marland Kitchen)  115/49 (BP Location: Left Leg)   Pulse 91   Temp 98.4 F (36.9 C) (Oral)   Resp 18   Ht _0  (1.626 m)   Wt 59.1 kg   SpO2 96%   BMI 22.36 kg/m   Physical Exam: Physical Exam Vitals and nursing note reviewed.  Constitutional:      General: She is not in acute distress.    Appearance: She is ill-appearing.  HENT:     Head: Normocephalic and atraumatic.  Cardiovascular:     Rate and Rhythm: Normal rate.   Pulmonary:     Effort: Pulmonary effort is normal. No respiratory distress.  Abdominal:     General: Abdomen is flat. Bowel sounds are normal.     Palpations: Abdomen is soft.  Skin:    General: Skin is warm and dry.     Comments: Darkened apparent gangrene to bilateral hands/fingers with discoloration extending to mid-forearm  Neurological:     General: No focal deficit present.     Mental Status: She is alert.  Psychiatric:        Mood and Affect: Mood normal.        Behavior: Behavior normal.     Palliative Assessment/Data: 30%    Existing Vynca/ACP Documentation: GOC document (completed 02/16/2022)  Assessment & Plan:   Impression: Present on Admission:  Septic shock ([place])  Sepsis ([place])  61 year old female with chronic comorbidities and acute presentations as described above.  She has poor functional status as an outpatient, COPD.  She had mixed septic and cardiogenic shock with AKI, pancytopenia, septic encephalopathy, multifocal pneumonia requiring pressors and eventually will need a left and right heart cath.  She also has limb ischemia to bilateral hands and was unable to start heparin due to pancytopenia initially, although heparin was eventually started. She was intubated for approximately 10 days, successful extubation (planned one-way with no intent to reintubate).  She is overall clinically improved.  Big issue at this point is dry gangrene to all UE digits, ischemia to hands extending with skin discoloration up to the mid forearm.  Vascular surgery is on board and recommending heparin, planned hand surgery eval before d/c if continued aggressive care.  The patient and her family today, after discussion, have elected a discussion with hospice to see if this is something they would be on board with.  They seem to agree that she would not want amputation.  Overall prognosis poor.  SUMMARY OF RECOMMENDATIONS   Remain DNR No trach, no PEG Continue current scope of  treatment at this time If she again declines, husband agreeable to transition to comfort [facility] consult for hospice referral PMT will follow-up Monday, unless called sooner for further needs Continued emotional and spiritual support of patient and family  Symptom Management:  Per primary team PMT is available to assist as needed  Code Status: DNR  Prognosis: Unable to determine  Discharge Planning: To Be Determined  Discussed with: Patient, patient's family, medical team, nursing team  Thank you for allowing [place] to participate in the care of Nicole Cunningham PMT will continue to support holistically.  Billing based on time  Time Total: 75 min  Visit consisted of counseling and education dealing with the complex and emotionally intense issues of symptom management and palliative care in the setting of serious and potentially life-threatening illness. Greater than 50%  of this time was spent counseling and coordinating care related to the above assessment and plan.   Walden Field, NP Palliative Medicine  Team  Team Phone # [phone] (Nights/Weekends)  11/22/2020, 8:17 AM

## 2022-02-17 NOTE — Progress Notes (Signed)
PROGRESS NOTE    VANETTA RULE  CLE:[id number] DOB: Feb 22, 1961 DOA: 01/28/2022 PCP: [name], MD   Brief Narrative:  Patient is a 34 old female with history of COPD, anemia, hypertension who was brought to the emergency department on 11/6 with altered mental status, shortness of breath.  On presentation, lab work showed creatinine of 4.1, lactic acid of 9.  CT head, chest x-ray did not show any acute findings.  CT abdomen/pelvis showed 17 mm nonobstructing renal calculus without hydronephrosis.  Patient was admitted for the management of septic shock under ICU service.  Patient had a prolonged hospital course, found to have E. coli bacteremia, echo showed EF of 20 to 25% for which cardiology consulted.  Patient was managed for cardiogenic/septic shock.  Transferred to [facility] service on 11/18.   Significant events as per PCCM :   11/6 septic shock on levo , ETT >> 11/6 Echo EF 20 to 25% global hypokinesis, apex intact 11/6 RIJ HD cath >> dobutamine at 5 mics 11/7>>Dobutamine increased to 7.5 mics , 1 unit PRBC and 1 unit platelet transfusion 11/8 dobutamine increased to 10 mics 11/10 dobut wean, diuretic, pigtail  11/13 restarted levophed overnight , Short run of SVT, weaned Dobutamine to 3 , repleted K, 1 Unit PRBC per cards ( HGB 7.2)  Assessment & Plan:   Principal Problem:   Septic shock (HCC) Active Problems:   Sepsis (HCC)   Acute encephalopathy   Pressure injury of skin  Shock: Mixed cardiogenic and septic shock.  Suspicion for sepsis/stress cardiomyopathy.   Dobutamine stopped.  Off vasopressors. Repeat echo shows 70% ejection fraction/hyperdynamic left ventricular.  No plans for cardiac cath.   Dysphagia: Patient underwent MBS on 02/14/2022 and was started on dysphagia 1 diet, SLP following, advance to dysphagia 2 diet.  Patient is very slowly progressing and she remains at risk of requiring alternative means of nutrition such as PEG tube placement but patient is  completely against that as well.   Bacteremia: Blood culture/urine culture showed E. coli.  Patient completed 10 days of Merrem antibiotics.   AKI: Likely from ATN from shock. She is getting some IV fluids ordered by cardiology, her creatinine has improved.   Pancytopenia/microcytic anemia.: Thought to be sepsis related.  She has thus far received 4 units of PRBC transfusion and 2 units of platelet transfusion during this hospitalization.  Hemoglobin dropped to the range of 6 and she received 1 unit of PRBC transfusion on 02/11/2022.  Hemoglobin once again dropped to 6.7 on 02/17/2022 and 1 unit of PRBC transfusion has been ordered.  Unfortunately, she needs heparin as well due to her severe upper extremity ischemia and dry gangrene.   Acute hypoxic respiratory failure secondary to multifocal pneumonia: History of COPD.  Currently on Brovana, Pulmicort, DuoNeb. currently on 2 L of oxygen via nasal cannula.  Tracheal aspirate has shown Enterobacter cloacae.  Continue current antibiotics. Patient was also found to have left parapneumonic effusion.  Status post pigtail placement, now has been removed.  Extubated on 11/16 with no intention to intubate.   Acute systolic CHF: Heart failure team following.  Echo showed EF of 20 to 25% with severe hypokinesis, normal RV, dilated IVC.  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  She is getting some IV fluids again today.  Cardiology managing.   Septic encephalopathy:   CT head on presentation did not show any acute findings.  Currently  unable to swallow, speech therapy following and recommending n.p.o.  Currently on tube feeding.  Speech therapy should continue to follow and evaluate her swallowing so that the feeding can be discontinued.  Started on Seroquel for agitation by PCCM team.  Might need to be titrated. Mental status has significantly improved and  she is currently alert and oriented.  Follows commands.     Upper extremity ischemia with dry gangrene: Present on admission.  Worse on the right side.  Also suspected to have worsening from pressures causing vaso constriction.Vascular surgery following.  Currently on low-dose heparin.  No plan for intervention.  Rx for autoamputation.  Vascular surgery following.  Per them, her hands are nonsalvageable and patient may need evaluation by hand surgery at some point in time however patient carries very poor prognosis with multiple comorbidities.  I had a very lengthy and frank discussion with the patient while her husband and son were present.  She has been informed by palliative care yesterday and I reiterated that unfortunately, her hands are nonsalvageable.  She does not want amputation of the hands.  I broached the topic of possibly transitioning to hospice.  The family would discuss with each other and will let us know.   Hypokalemia/hypernatremia: Sodium and potassium both normalized, reduced free water boluses frequency from every 4 hours to every 8 hours.   Nonobstructing ureteral stone: We recommend follow-up with urology as an outpatient.  Thrombocytopenia: Likely sepsis driven.  Improved.   Goals of care/deconditioning/debility: Palliative care closely following.  Currently DNR.   DVT prophylaxis: Place and maintain sequential compression device Start: 01/29/22 1021   Code Status: DNR  Family Communication:  None present at bedside.  Status is: Inpatient Remains inpatient appropriate because: Patient very sick.     Estimated body mass index is 22.36 kg/m as calculated from the following:   Height as of this encounter: 5\' 4"  (1.626 m).   Weight as of this encounter: 59.1 kg.  Pressure Injury 02/08/22 Buttocks Right Stage 2 -  Partial thickness loss of dermis presenting as a shallow open injury with a red, pink wound bed without slough. (Active)  02/08/22 0800  Location: Buttocks   Location Orientation: Right  Staging: Stage 2 -  Partial thickness loss of dermis presenting as a shallow open injury with a red, pink wound bed without slough.  Wound Description (Comments):   Present on Admission:   Dressing Type Foam - Lift dressing to assess site every shift 02/16/22 2000   Nutritional Assessment: Body mass index is 22.36 kg/m.Marland Kitchen Seen by dietician.  I agree with the assessment and plan as outlined below: Nutrition Status: Nutrition Problem: Severe Malnutrition Etiology: chronic illness (COPD) Signs/Symptoms: severe muscle depletion, severe fat depletion Interventions: Tube feeding  . Skin Assessment: I have examined the patient's skin and I agree with the wound assessment as performed by the wound care RN as outlined below: Pressure Injury 02/08/22 Buttocks Right Stage 2 -  Partial thickness loss of dermis presenting as a shallow open injury with a red, pink wound bed without slough. (Active)  02/08/22 0800  Location: Buttocks  Location Orientation: Right  Staging: Stage 2 -  Partial thickness loss of dermis presenting as a shallow open injury with a red, pink wound bed without slough.  Wound Description (Comments):   Present on Admission:   Dressing Type Foam - Lift dressing to assess site every shift 02/16/22 2000    Consultants:  Cardiology Vascular surgery Palliative care Procedures:  As above  Antimicrobials:  Anti-infectives (From admission, onward)    Start     Dose/Rate Route Frequency Ordered Stop   02/04/22 1345  meropenem (MERREM) 1 g in sodium chloride 0.9 % 100 mL IVPB        1 g 200 mL/hr over 30 Minutes Intravenous Every 12 hours 02/04/22 1258 02/13/22 2350   02/01/22 2200  ceFAZolin (ANCEF) IVPB 2g/100 mL premix  Status:  Discontinued        2 g 200 mL/hr over 30 Minutes Intravenous Every 12 hours 02/01/22 0754 02/04/22 1258   01/29/22 2200  ceFEPIme (MAXIPIME) 1 g in sodium chloride 0.9 % 100 mL IVPB  Status:  Discontinued        1  g 200 mL/hr over 30 Minutes Intravenous Every 24 hours 01/29/22 0046 01/29/22 0242   01/29/22 2200  cefTRIAXone (ROCEPHIN) 2 g in sodium chloride 0.9 % 100 mL IVPB  Status:  Discontinued        2 g 200 mL/hr over 30 Minutes Intravenous Every 24 hours 01/29/22 0242 02/01/22 0754   01/29/22 0048  vancomycin variable dose per unstable renal function (pharmacist dosing)  Status:  Discontinued         Does not apply See admin instructions 01/29/22 0048 01/29/22 1343   01/28/22 2230  ceFEPIme (MAXIPIME) 2 g in sodium chloride 0.9 % 100 mL IVPB        2 g 200 mL/hr over 30 Minutes Intravenous  Once 01/28/22 2228 01/28/22 2350   01/28/22 2230  metroNIDAZOLE (FLAGYL) IVPB 500 mg        500 mg 100 mL/hr over 60 Minutes Intravenous  Once 01/28/22 2228 01/29/22 0021   01/28/22 2230  vancomycin (VANCOCIN) IVPB 1000 mg/200 mL premix        1,000 mg 200 mL/hr over 60 Minutes Intravenous  Once 01/28/22 2228 01/29/22 0150         Subjective:  Patient seen and examined.  She has no new complaint other than bilateral hand pain.  Husband and son at the bedside.  Objective: Vitals:   02/17/22 0004 02/17/22 0435 02/17/22 0605 02/17/22 0853  BP: (!) 117/51 130/63 (!) 115/49   Pulse: 91 73 91   Resp: 18 17 18    Temp: 98.1 F (36.7 C) 99 F (37.2 C) 98.4 F (36.9 C)   TempSrc: Oral Oral Oral   SpO2: 96% 97% 97% 96%  Weight:      Height:        Intake/Output Summary (Last 24 hours) at 02/17/2022 1139 Last data filed at 02/17/2022 1046 Gross per 24 hour  Intake 3156.24 ml  Output 650 ml  Net 2506.24 ml    Filed Weights   02/14/22 0359 02/15/22 0406 02/16/22 0431  Weight: 58.1 kg 56.5 kg 59.1 kg    Examination:  General exam: Appears calm and comfortable  Respiratory system: Clear to auscultation. Respiratory effort normal. Cardiovascular system: S1 & S2 heard, RRR. No JVD, murmurs, rubs, gallops or clicks.  +3 pitting edema bilateral lower extremity, +2 pitting edema bilateral upper  extremities. Gastrointestinal system: Abdomen is nondistended, soft and nontender. No organomegaly or masses felt. Normal bowel sounds heard. Central nervous system: Alert and oriented. No focal neurological deficits. Extremities: Symmetric 5 x 5 power.  Gangrenous fingers both hands Skin: No rashes, lesions or ulcers.   Data Reviewed: I have personally reviewed following labs and imaging studies  CBC: Recent Labs  Lab 02/13/22 0340 02/14/22 0410 02/15/22 0500 02/16/22 0400 02/17/22 0330  WBC 11.7* 9.3 7.4 6.9 7.2  HGB 7.9* 7.6* 7.3* 7.2* 6.7*  HCT 23.5* 23.2* 22.4* 22.1* 21.2*  MCV 79.1* 81.7 82.1 81.9 81.5  PLT 278 281 258 275 [id number]    Basic Metabolic Panel: Recent Labs  Lab 02/13/22 0340 02/14/22 0410 02/15/22 0500 02/16/22 0400 02/17/22 0330  NA 149* 144 141 144 139  K 3.6 3.9 4.0 3.9 4.1  CL 124* 122* 116* 116* 115*  CO2 14* 15* 17* 17* 18*  GLUCOSE 138* 124* 113* 120* 119*  BUN 52* 49* 42* 37* 31*  CREATININE 1.37* 1.39* 1.23* 1.09* 1.03*  CALCIUM 8.3* 7.8* 8.1* 8.2* 8.0*    GFR: Estimated Creatinine Clearance: 48.3 mL/min (A) (by C-G formula based on SCr of 1.03 mg/dL (H)). Liver Function Tests: No results for input(s): "AST", "ALT", "ALKPHOS", "BILITOT", "PROT", "ALBUMIN" in the last 168 hours.  No results for input(s): "LIPASE", "AMYLASE" in the last 168 hours. No results for input(s): "AMMONIA" in the last 168 hours. Coagulation Profile: No results for input(s): "INR", "PROTIME" in the last 168 hours. Cardiac Enzymes: No results for input(s): "CKTOTAL", "CKMB", "CKMBINDEX", "TROPONINI" in the last 168 hours. BNP (last 3 results) No results for input(s): "PROBNP" in the last 8760 hours. HbA1C: No results for input(s): "HGBA1C" in the last 72 hours. CBG: Recent Labs  Lab 02/16/22 1635 02/16/22 1928 02/17/22 0006 02/17/22 0437 02/17/22 0807  GLUCAP 89 114* 115* 113* 94    Lipid Profile: No results for input(s): "CHOL", "HDL", "LDLCALC", "TRIG",  "CHOLHDL", "LDLDIRECT" in the last 72 hours. Thyroid Function Tests: No results for input(s): "TSH", "T4TOTAL", "FREET4", "T3FREE", "THYROIDAB" in the last 72 hours. Anemia Panel: No results for input(s): "VITAMINB12", "FOLATE", "FERRITIN", "TIBC", "IRON", "RETICCTPCT" in the last 72 hours. Sepsis Labs: No results for input(s): "PROCALCITON", "LATICACIDVEN" in the last 168 hours.  No results found for this or any previous visit (from the past 240 hour(s)).    Radiology Studies: No results found.  Scheduled Meds:  acetaminophen  1,000 mg Per Tube TID   amLODipine  5 mg Oral Daily   arformoterol  15 mcg Nebulization BID   budesonide (PULMICORT) nebulizer solution  0.25 mg Nebulization BID   carvedilol  3.125 mg Oral BID WC   Chlorhexidine Gluconate Cloth  6 each Topical Q0600   docusate  100 mg Per Tube BID   feeding supplement  237 mL Oral BID BM   FLUoxetine  20 mg Per Tube Daily   free water  300 mL Per Tube Q8H   insulin aspart  0-6 Units Subcutaneous Q4H   mouth rinse  15 mL Mouth Rinse 4 times per day   polyethylene glycol  17 g Per Tube Daily   QUEtiapine  50 mg Per Tube BID   sodium chloride flush  10-40 mL Intracatheter Q12H   Continuous Infusions:  sodium chloride 10 mL/hr at 02/12/22 0600   sodium chloride Stopped (02/09/22 2311)   feeding supplement (VITAL 1.5 CAL) 1,000 mL (02/16/22 1755)   heparin 1,100 Units/hr (02/17/22 0602)     LOS: 19 days   Darliss Cheney, MD [facility]  02/17/2022, 11:39 AM   *Please note that this is a verbal dictation therefore any spelling or grammatical errors are due to the "[employer]" system interpretation.  Please page via [employer] and do not message via secure chat for urgent patient care matters. Secure chat can be used for non urgent patient care matters.  How to contact the [facility] Attending or Consulting provider  7A - 7P or covering provider during after hours [place], for this patient?  Check the care team in  [facility] and look for a) attending/consulting [facility] provider listed and b) the [facility] team listed. Page or secure chat 7A-7P. Log into [email or web address] and use [place] universal password to access. If you do not have the password, please contact the hospital operator. Locate the [facility] provider you are looking for under [facility] and page to a number that you can be directly reached. If you still have difficulty reaching the provider, please page the [facility] (Director on Call) for the Hospitalists listed on amion for assistance.

## 2022-02-17 NOTE — Progress Notes (Signed)
ANTICOAGULATION CONSULT NOTE - Follow Up Consult  Pharmacy Consult for Heparin Indication: ischemic digits  No Active Allergies  Patient Measurements: Height: 5\' 4"  (162.6 cm) Weight: 59.1 kg (130 lb 4.7 oz) IBW/kg (Calculated) : 54.7 Heparin Dosing Weight: 48 kg  Vital Signs: Temp: 98.4 F (36.9 C) (11/25 1145) Temp Source: Oral (11/25 1145) BP: 122/54 (11/25 1145) Pulse Rate: 88 (11/25 0800)  Labs: Recent Labs    02/15/22 0500 02/16/22 0400 02/16/22 0410 02/17/22 0330 02/17/22 1400  HGB 7.3* 7.2*  --  6.7* 7.0*  HCT 22.4* 22.1*  --  21.2* 21.5*  PLT 258 275  --  255 272  HEPARINUNFRC 0.54  --  0.58 >1.10* >1.10*  CREATININE 1.23* 1.09*  --  1.03*  --      Estimated Creatinine Clearance: 48.3 mL/min (A) (by C-G formula based on SCr of 1.03 mg/dL (H)).   Medical History: Past Medical History:  Diagnosis Date   Bronchitis    COPD (chronic obstructive pulmonary disease) (HCC)    Hypertension     Assessment: 61 yo female presented on 11/6 with AMS found to have e coli bacteremia and mixed cardiogenic/septic shock. Course complicated by ischemic fingers and vascular consulted but unable to use IV heparin due to thrombocytopenia. Noted dark tarry stools and noted somewhat bloody smears on 11/13 per RN. Pharmacy consulted to dose heparin with low goal and no bolus now that plts >50.  Heparin level came back elevated >1.1. Running centrally and being drawn centrally from another lumen. Order redraw peripherally which came back slightly supratherapeutic at 0.69. Hgb 7, plt 272. No s/sx of bleeding or infusion issues.   Goal of Therapy:  Heparin level 0.3-0.5 units/ml Monitor platelets by anticoagulation protocol: Yes   Plan:  Reduce heparin to 1000 units/hr  Order heparin level in 6 hours  Monitor CBC, HL, and for s/sx of bleeding   12/13, PharmD, BCCCP Clinical Pharmacist  Phone: [phone] 02/17/2022 4:08 PM  Please check AMION for all [facility]  phone numbers After 10:00 PM, call Main Pharmacy [phone]

## 2022-02-17 NOTE — Progress Notes (Signed)
Notified Dr. Dow Adolph of Hgb 6.7.

## 2022-02-18 LAB — BASIC METABOLIC PANEL
Anion gap: 6 (ref 5–15)
BUN: 27 mg/dL — ABNORMAL HIGH (ref 8–23)
CO2: 18 mmol/L — ABNORMAL LOW (ref 22–32)
Calcium: 8.3 mg/dL — ABNORMAL LOW (ref 8.9–10.3)
Chloride: 115 mmol/L — ABNORMAL HIGH (ref 98–111)
Creatinine, Ser: 1.07 mg/dL — ABNORMAL HIGH (ref 0.44–1.00)
GFR, Estimated: 58 mL/min — ABNORMAL LOW (ref >60)
Glucose, Bld: 121 mg/dL — ABNORMAL HIGH (ref 70–99)
Potassium: 3.9 mmol/L (ref 3.5–5.1)
Sodium: 139 mmol/L (ref 135–145)

## 2022-02-18 LAB — CBC
HCT: 27.7 % — ABNORMAL LOW (ref 36.0–46.0)
Hemoglobin: 9 g/dL — ABNORMAL LOW (ref 12.0–15.0)
MCH: 26.6 pg (ref 26.0–34.0)
MCHC: 32.5 g/dL (ref 30.0–36.0)
MCV: 82 fL (ref 80.0–100.0)
Platelets: 270 10*3/uL (ref 150–400)
RBC: 3.38 MIL/uL — ABNORMAL LOW (ref 3.87–5.11)
RDW: 24 % — ABNORMAL HIGH (ref 11.5–15.5)
WBC: 7.1 10*3/uL (ref 4.0–10.5)
nRBC: 0 % (ref 0.0–0.2)

## 2022-02-18 LAB — TYPE AND SCREEN
ABO/RH(D): AB POS
Antibody Screen: NEGATIVE
Unit division: 0

## 2022-02-18 LAB — GLUCOSE, CAPILLARY
Glucose-Capillary: 106 mg/dL — ABNORMAL HIGH (ref 70–99)
Glucose-Capillary: 107 mg/dL — ABNORMAL HIGH (ref 70–99)
Glucose-Capillary: 108 mg/dL — ABNORMAL HIGH (ref 70–99)
Glucose-Capillary: 116 mg/dL — ABNORMAL HIGH (ref 70–99)
Glucose-Capillary: 85 mg/dL (ref 70–99)
Glucose-Capillary: 96 mg/dL (ref 70–99)
Glucose-Capillary: 97 mg/dL (ref 70–99)

## 2022-02-18 LAB — HEPARIN LEVEL (UNFRACTIONATED)
Heparin Unfractionated: 0.52 IU/mL (ref 0.30–0.70)
Heparin Unfractionated: >1.1 IU/mL — ABNORMAL HIGH (ref 0.30–0.70)
Heparin Unfractionated: 0.52 IU/mL (ref 0.30–0.70)

## 2022-02-18 LAB — BPAM RBC
Blood Product Expiration Date: 202312172359
ISSUE DATE / TIME: 202311251721
Unit Type and Rh: 8400

## 2022-02-18 LAB — HEMOGLOBIN AND HEMATOCRIT, BLOOD
HCT: 27.7 % — ABNORMAL LOW (ref 36.0–46.0)
Hemoglobin: 9.1 g/dL — ABNORMAL LOW (ref 12.0–15.0)

## 2022-02-18 MED ORDER — ORAL CARE MOUTH RINSE: 15.0000 mL | OROMUCOSAL | Status: DC | PRN

## 2022-02-18 NOTE — Plan of Care (Signed)
  Problem: Clinical Measurements: Goal: Respiratory complications will improve Outcome: Progressing Goal: Cardiovascular complication will be avoided Outcome: Progressing   Problem: Nutrition: Goal: Adequate nutrition will be maintained Outcome: Progressing   Problem: Elimination: Goal: Will not experience complications related to bowel motility Outcome: Progressing Goal: Will not experience complications related to urinary retention Outcome: Progressing   Problem: Pain Managment: Goal: General experience of comfort will improve Outcome: Progressing   Problem: Activity: Goal: Risk for activity intolerance will decrease Outcome: Not Progressing   Problem: Skin Integrity: Goal: Risk for impaired skin integrity will decrease Outcome: Not Progressing

## 2022-02-18 NOTE — Progress Notes (Signed)
ANTICOAGULATION CONSULT NOTE - Follow Up Consult  Pharmacy Consult for heparin Indication:  ischemic digits  Labs: Recent Labs    02/15/22 0500 02/16/22 0400 02/16/22 0410 02/17/22 0330 02/17/22 1400 02/17/22 1635 02/18/22 0230  HGB 7.3* 7.2*  --  6.7* 7.0*  --   --   HCT 22.4* 22.1*  --  21.2* 21.5*  --   --   PLT 258 275  --  255 272  --   --   HEPARINUNFRC 0.54  --    < > >1.10* >1.10* 0.69 0.52  CREATININE 1.23* 1.09*  --  1.03*  --   --   --    < > = values in this interval not displayed.    Assessment/Plan:  61yo female therapeutic on heparin after rate change. Will continue infusion at current rate of 1000 units/hr and confirm stable with additional level.   Vernard Gambles, PharmD, BCPS  02/18/2022,3:05 AM

## 2022-02-18 NOTE — Progress Notes (Addendum)
ANTICOAGULATION CONSULT NOTE - Follow Up Consult  Pharmacy Consult for Heparin Indication: ischemic digits  No Active Allergies  Patient Measurements: Height: 5\' 4"  (162.6 cm) Weight: 62.8 kg (138 lb 7.2 oz) IBW/kg (Calculated) : 54.7 Heparin Dosing Weight: 48 kg  Vital Signs: Temp: 98.1 F (36.7 C) (11/26 0720) Temp Source: Oral (11/26 0720) BP: 123/72 (11/26 0720) Pulse Rate: 95 (11/26 0720)  Labs: Recent Labs    02/16/22 0400 02/16/22 0410 02/17/22 0330 02/17/22 1400 02/17/22 1635 02/18/22 0230 02/18/22 0517 02/18/22 0900  HGB 7.2*  --  6.7* 7.0*  --  9.1* 9.0*  --   HCT 22.1*  --  21.2* 21.5*  --  27.7* 27.7*  --   PLT 275  --  255 272  --   --  270  --   HEPARINUNFRC  --    < > >1.10* >1.10* 0.69 0.52  --  >1.10*  CREATININE 1.09*  --  1.03*  --   --   --  1.07*  --    < > = values in this interval not displayed.     Estimated Creatinine Clearance: 46.5 mL/min (A) (by C-G formula based on SCr of 1.07 mg/dL (H)).   Medical History: Past Medical History:  Diagnosis Date   Bronchitis    COPD (chronic obstructive pulmonary disease) (HCC)    Hypertension     Assessment: 61 yo female presented on 11/6 with AMS found to have e coli bacteremia and mixed cardiogenic/septic shock. Course complicated by ischemic fingers and vascular consulted but unable to use IV heparin due to thrombocytopenia. Noted dark tarry stools and noted somewhat bloody smears on 11/13 per RN. Pharmacy consulted to dose heparin with low goal and no bolus now that plts >50.  Heparin level came back elevated >1.1. Running centrally and being drawn centrally. Ordered redraw. Hgb 9, plt 270. No s/sx of bleeding or infusion issues.   Goal of Therapy:  Heparin level 0.3-0.5 units/ml Monitor platelets by anticoagulation protocol: Yes   Plan:  Redraw heparin level given likely lab error Continue heparin at 1000 units/hr until repeat heparin level returns Monitor CBC, HL, and for s/sx of  bleeding   ADDENDUM:  Repeat heparin level is 0.52. Will decrease heparin to 950 units/hr and recheck in the AM.  Thank you for allowing pharmacy to participate in this patient's care.  12/13, PharmD PGY2 Pharmacy Resident 02/18/2022 2:06 PM Check [email or web address] for unit specific pharmacy number

## 2022-02-18 NOTE — Plan of Care (Signed)
  Problem: Clinical Measurements: Goal: Respiratory complications will improve Outcome: Progressing Goal: Cardiovascular complication will be avoided Outcome: Progressing   Problem: Nutrition: Goal: Adequate nutrition will be maintained Outcome: Progressing   Problem: Elimination: Goal: Will not experience complications related to bowel motility Outcome: Progressing Goal: Will not experience complications related to urinary retention Outcome: Progressing   Problem: Activity: Goal: Risk for activity intolerance will decrease Outcome: Not Progressing   Problem: Pain Managment: Goal: General experience of comfort will improve Outcome: Not Progressing   Problem: Activity: Goal: Ability to tolerate increased activity will improve Outcome: Not Progressing

## 2022-02-18 NOTE — Progress Notes (Signed)
PROGRESS NOTE    Nicole Cunningham  TTS:[id number] DOB: [birth year] DOA: 01/28/2022 PCP: [name], MD   Brief Narrative:  Patient is a 59 old female with history of COPD, anemia, hypertension who was brought to the emergency department on 11/6 with altered mental status, shortness of breath.  On presentation, lab work showed creatinine of 4.1, lactic acid of 9.  CT head, chest x-ray did not show any acute findings.  CT abdomen/pelvis showed 17 mm nonobstructing renal calculus without hydronephrosis.  Patient was admitted for the management of septic shock under ICU service.  Patient had a prolonged hospital course, found to have E. coli bacteremia, echo showed EF of 20 to 25% for which cardiology consulted.  Patient was managed for cardiogenic/septic shock.  Transferred to [facility] service on 11/18.   Significant events as per PCCM :   11/6 septic shock on levo , ETT >> 11/6 Echo EF 20 to 25% global hypokinesis, apex intact 11/6 RIJ HD cath >> dobutamine at 5 mics 11/7>>Dobutamine increased to 7.5 mics , 1 unit PRBC and 1 unit platelet transfusion 11/8 dobutamine increased to 10 mics 11/10 dobut wean, diuretic, pigtail  11/13 restarted levophed overnight , Short run of SVT, weaned Dobutamine to 3 , repleted K, 1 Unit PRBC per cards ( HGB 7.2)  Assessment & Plan:   Principal Problem:   Septic shock (HCC) Active Problems:   Sepsis (HCC)   Acute encephalopathy   Pressure injury of skin  Shock: Mixed cardiogenic and septic shock.  Suspicion for sepsis/stress cardiomyopathy.   Dobutamine stopped.  Off vasopressors. Repeat echo shows 70% ejection fraction/hyperdynamic left ventricular.  No plans for cardiac cath.   Dysphagia: Patient underwent MBS on 02/14/2022 and was started on dysphagia 1 diet, SLP following, advance to dysphagia 2 diet.  Patient is very slowly progressing and she remains at risk of requiring alternative means of nutrition such as PEG tube placement but patient is  completely against that as well.   Bacteremia: Blood culture/urine culture showed E. coli.  Patient completed 10 days of Merrem antibiotics.   AKI: Likely from ATN from shock. She is getting some IV fluids ordered by cardiology, her creatinine has improved.   Pancytopenia/microcytic anemia.: Thought to be sepsis related.  She has thus far received 5 units of PRBC transfusion and 2 units of platelet transfusion during this hospitalization.  Hemoglobin dropped to the range of 6 and she received 1 unit of PRBC transfusion on 02/11/2022.  Hemoglobin once again dropped to 6.7 on 02/17/2022 and 1 unit of PRBC transfusion has been ordered.  Hemoglobin over 9 today.  Monitor closely.  Unfortunately, she needs heparin as well due to her severe upper extremity ischemia and dry gangrene.   Acute hypoxic respiratory failure secondary to multifocal pneumonia: History of COPD.  Currently on Brovana, Pulmicort, DuoNeb. currently on 2 L of oxygen via nasal cannula.  Tracheal aspirate has shown Enterobacter cloacae.  Continue current antibiotics. Patient was also found to have left parapneumonic effusion.  Status post pigtail placement, now has been removed.  Extubated on 11/16 with no intention to intubate.   Acute systolic CHF: Heart failure team following.  Echo showed EF of 20 to 25% with severe hypokinesis, normal RV, dilated IVC.  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  She is getting some IV fluids again today.  Cardiology managing.   Septic encephalopathy:   CT head on presentation  did not show any acute findings.  Currently unable to swallow, speech therapy following and recommending n.p.o.  Currently on tube feeding.  Speech therapy should continue to follow and evaluate her swallowing so that the feeding can be discontinued.  Started on Seroquel for agitation by PCCM team.  Might need to be  titrated. Mental status has significantly improved and she is currently alert and oriented.  Follows commands.     Upper extremity ischemia with dry gangrene: Present on admission.  Worse on the right side.  Also suspected to have worsening from pressures causing vaso constriction.Vascular surgery following.  Currently on low-dose heparin.  No plan for intervention.  Rx for autoamputation.  Vascular surgery following.  Per them, her hands are nonsalvageable and patient may need evaluation by hand surgery at some point in time however patient carries very poor prognosis with multiple comorbidities.  I had a very lengthy and frank discussion with the patient while her husband and son were present.  She has been informed by palliative care yesterday and I reiterated that unfortunately, her hands are nonsalvageable.  She does not want amputation of the hands.  I broached the topic of possibly transitioning to hospice.  The family would discuss with each other and will let us know.   Hypokalemia/hypernatremia: Sodium and potassium both normalized, reduced free water boluses frequency from every 4 hours to every 8 hours.   Nonobstructing ureteral stone: We recommend follow-up with urology as an outpatient.  Thrombocytopenia: Likely sepsis driven.  Improved.   Goals of care/deconditioning/debility: Palliative care closely following.  Currently DNR.   DVT prophylaxis: Place and maintain sequential compression device Start: 01/29/22 1021   Code Status: DNR  Family Communication:  None present at bedside.  Status is: Inpatient Remains inpatient appropriate because: Patient very sick.     Estimated body mass index is 23.76 kg/m as calculated from the following:   Height as of this encounter:  (1.626 m).   Weight as of this encounter: 62.8 kg.  Pressure Injury 02/08/22 Buttocks Right Stage 2 -  Partial thickness loss of dermis presenting as a shallow open injury with a red, pink wound bed without  slough. (Active)  02/08/22 0800  Location: Buttocks  Location Orientation: Right  Staging: Stage 2 -  Partial thickness loss of dermis presenting as a shallow open injury with a red, pink wound bed without slough.  Wound Description (Comments):   Present on Admission:   Dressing Type Foam - Lift dressing to assess site every shift 02/18/22 0900   Nutritional Assessment: Body mass index is 23.76 kg/m.Marland Kitchen Seen by dietician.  I agree with the assessment and plan as outlined below: Nutrition Status: Nutrition Problem: Severe Malnutrition Etiology: chronic illness (COPD) Signs/Symptoms: severe muscle depletion, severe fat depletion Interventions: Tube feeding  . Skin Assessment: I have examined the patient's skin and I agree with the wound assessment as performed by the wound care RN as outlined below: Pressure Injury 02/08/22 Buttocks Right Stage 2 -  Partial thickness loss of dermis presenting as a shallow open injury with a red, pink wound bed without slough. (Active)  02/08/22 0800  Location: Buttocks  Location Orientation: Right  Staging: Stage 2 -  Partial thickness loss of dermis presenting as a shallow open injury with a red, pink wound bed without slough.  Wound Description (Comments):   Present on Admission:   Dressing Type Foam - Lift dressing to assess site every shift 02/18/22 0900    Consultants:  Cardiology  Vascular surgery Palliative care Procedures:  As above  Antimicrobials:  Anti-infectives (From admission, onward)    Start     Dose/Rate Route Frequency Ordered Stop   02/04/22 1345  meropenem (MERREM) 1 g in sodium chloride 0.9 % 100 mL IVPB        1 g 200 mL/hr over 30 Minutes Intravenous Every 12 hours 02/04/22 1258 02/13/22 2350   02/01/22 2200  ceFAZolin (ANCEF) IVPB 2g/100 mL premix  Status:  Discontinued        2 g 200 mL/hr over 30 Minutes Intravenous Every 12 hours 02/01/22 0754 02/04/22 1258   01/29/22 2200  ceFEPIme (MAXIPIME) 1 g in sodium  chloride 0.9 % 100 mL IVPB  Status:  Discontinued        1 g 200 mL/hr over 30 Minutes Intravenous Every 24 hours 01/29/22 0046 01/29/22 0242   01/29/22 2200  cefTRIAXone (ROCEPHIN) 2 g in sodium chloride 0.9 % 100 mL IVPB  Status:  Discontinued        2 g 200 mL/hr over 30 Minutes Intravenous Every 24 hours 01/29/22 0242 02/01/22 0754   01/29/22 0048  vancomycin variable dose per unstable renal function (pharmacist dosing)  Status:  Discontinued         Does not apply See admin instructions 01/29/22 0048 01/29/22 1343   01/28/22 2230  ceFEPIme (MAXIPIME) 2 g in sodium chloride 0.9 % 100 mL IVPB        2 g 200 mL/hr over 30 Minutes Intravenous  Once 01/28/22 2228 01/28/22 2350   01/28/22 2230  metroNIDAZOLE (FLAGYL) IVPB 500 mg        500 mg 100 mL/hr over 60 Minutes Intravenous  Once 01/28/22 2228 01/29/22 0021   01/28/22 2230  vancomycin (VANCOCIN) IVPB 1000 mg/200 mL premix        1,000 mg 200 mL/hr over 60 Minutes Intravenous  Once 01/28/22 2228 01/29/22 0150         Subjective:  Seen and examined, no new complaint other than chronic bilateral hand pain.  Objective: Vitals:   02/17/22 2114 02/18/22 0013 02/18/22 0431 02/18/22 0720  BP: (!) 115/57 (!) 132/58 115/61 123/72  Pulse: 91 91 86 95  Resp: (!) 26 19 (!) 21 (!) 27  Temp: 98.5 F (36.9 C) 98.7 F (37.1 C) 98.4 F (36.9 C) 98.1 F (36.7 C)  TempSrc: Oral Oral Oral Oral  SpO2: 98% 95% 97% 98%  Weight:   62.8 kg   Height:        Intake/Output Summary (Last 24 hours) at 02/18/2022 1117 Last data filed at 02/18/2022 1030 Gross per 24 hour  Intake 2200.37 ml  Output 1350 ml  Net 850.37 ml    Filed Weights   02/15/22 0406 02/16/22 0431 02/18/22 0431  Weight: 56.5 kg 59.1 kg 62.8 kg    Examination:  General exam: Appears calm and comfortable  Respiratory system: Clear to auscultation. Respiratory effort normal. Cardiovascular system: S1 & S2 heard, RRR. No JVD, murmurs, rubs, gallops or clicks.  +3  pitting edema bilateral lower extremity, +2 pitting edema bilateral upper extremities. Gastrointestinal system: Abdomen is nondistended, soft and nontender. No organomegaly or masses felt. Normal bowel sounds heard. Central nervous system: Alert and oriented. No focal neurological deficits. Extremities: Symmetric 5 x 5 power.  Gangrenous fingers both hands Skin: No rashes, lesions or ulcers.   Data Reviewed: I have personally reviewed following labs and imaging studies  CBC: Recent Labs  Lab 02/15/22 0500 02/16/22 0400 02/17/22  0330 02/17/22 1400 02/18/22 0230 02/18/22 0517  WBC 7.4 6.9 7.2 7.4  --  7.1  HGB 7.3* 7.2* 6.7* 7.0* 9.1* 9.0*  HCT 22.4* 22.1* 21.2* 21.5* 27.7* 27.7*  MCV 82.1 81.9 81.5 80.5  --  82.0  PLT 258 275 255 272  --  270    Basic Metabolic Panel: Recent Labs  Lab 02/14/22 0410 02/15/22 0500 02/16/22 0400 02/17/22 0330 02/18/22 0517  NA 144 141 144 139 139  K 3.9 4.0 3.9 4.1 3.9  CL 122* 116* 116* 115* 115*  CO2 15* 17* 17* 18* 18*  GLUCOSE 124* 113* 120* 119* 121*  BUN 49* 42* 37* 31* 27*  CREATININE 1.39* 1.23* 1.09* 1.03* 1.07*  CALCIUM 7.8* 8.1* 8.2* 8.0* 8.3*    GFR: Estimated Creatinine Clearance: 46.5 mL/min (A) (by C-G formula based on SCr of 1.07 mg/dL (H)). Liver Function Tests: No results for input(s): "AST", "ALT", "ALKPHOS", "BILITOT", "PROT", "ALBUMIN" in the last 168 hours.  No results for input(s): "LIPASE", "AMYLASE" in the last 168 hours. No results for input(s): "AMMONIA" in the last 168 hours. Coagulation Profile: No results for input(s): "INR", "PROTIME" in the last 168 hours. Cardiac Enzymes: No results for input(s): "CKTOTAL", "CKMB", "CKMBINDEX", "TROPONINI" in the last 168 hours. BNP (last 3 results) No results for input(s): "PROBNP" in the last 8760 hours. HbA1C: No results for input(s): "HGBA1C" in the last 72 hours. CBG: Recent Labs  Lab 02/17/22 1603 02/17/22 1949 02/18/22 0015 02/18/22 0432 02/18/22 0805   GLUCAP 86 119* 85 116* 106*    Lipid Profile: No results for input(s): "CHOL", "HDL", "LDLCALC", "TRIG", "CHOLHDL", "LDLDIRECT" in the last 72 hours. Thyroid Function Tests: No results for input(s): "TSH", "T4TOTAL", "FREET4", "T3FREE", "THYROIDAB" in the last 72 hours. Anemia Panel: No results for input(s): "VITAMINB12", "FOLATE", "FERRITIN", "TIBC", "IRON", "RETICCTPCT" in the last 72 hours. Sepsis Labs: No results for input(s): "PROCALCITON", "LATICACIDVEN" in the last 168 hours.  No results found for this or any previous visit (from the past 240 hour(s)).    Radiology Studies: No results found.  Scheduled Meds:  sodium chloride   Intravenous Once   acetaminophen  1,000 mg Per Tube TID   amLODipine  5 mg Oral Daily   arformoterol  15 mcg Nebulization BID   budesonide (PULMICORT) nebulizer solution  0.25 mg Nebulization BID   carvedilol  3.125 mg Oral BID WC   Chlorhexidine Gluconate Cloth  6 each Topical Q0600   docusate  100 mg Per Tube BID   feeding supplement  237 mL Oral BID BM   FLUoxetine  20 mg Per Tube Daily   free water  300 mL Per Tube Q8H   insulin aspart  0-6 Units Subcutaneous Q4H   mouth rinse  15 mL Mouth Rinse 4 times per day   polyethylene glycol  17 g Per Tube Daily   QUEtiapine  50 mg Per Tube BID   sodium chloride flush  10-40 mL Intracatheter Q12H   Continuous Infusions:  sodium chloride 10 mL/hr at 02/12/22 0600   sodium chloride Stopped (02/09/22 2311)   feeding supplement (VITAL 1.5 CAL) 60 mL/hr at 02/18/22 0319   heparin 1,000 Units/hr (02/18/22 0605)     LOS: 20 days   Hughie Closs, MD [facility]  02/18/2022, 11:17 AM   *Please note that this is a verbal dictation therefore any spelling or grammatical errors are due to the "Dragon [employer] One" system interpretation.  Please page via Amion and do not message via  secure chat for urgent patient care matters. Secure chat can be used for non urgent patient care matters.  How to  contact the [facility] Attending or Consulting provider 7A - 7P or covering provider during after hours 7P -7A, for this patient?  Check the care team in [facility] and look for a) attending/consulting [facility] provider listed and b) the [facility] team listed. Page or secure chat 7A-7P. Log into [email or web address] and use [place]'s universal password to access. If you do not have the password, please contact the hospital operator. Locate the [facility] provider you are looking for under [facility] and page to a number that you can be directly reached. If you still have difficulty reaching the provider, please page the [facility] (Director on Call) for the Hospitalists listed on amion for assistance.

## 2022-02-19 LAB — BASIC METABOLIC PANEL
Anion gap: 6 (ref 5–15)
BUN: 27 mg/dL — ABNORMAL HIGH (ref 8–23)
CO2: 18 mmol/L — ABNORMAL LOW (ref 22–32)
Calcium: 8.1 mg/dL — ABNORMAL LOW (ref 8.9–10.3)
Chloride: 112 mmol/L — ABNORMAL HIGH (ref 98–111)
Creatinine, Ser: 0.96 mg/dL (ref 0.44–1.00)
GFR, Estimated: >60 mL/min (ref >60)
Glucose, Bld: 120 mg/dL — ABNORMAL HIGH (ref 70–99)
Potassium: 4 mmol/L (ref 3.5–5.1)
Sodium: 136 mmol/L (ref 135–145)

## 2022-02-19 LAB — CBC
HCT: 27.2 % — ABNORMAL LOW (ref 36.0–46.0)
Hemoglobin: 9.2 g/dL — ABNORMAL LOW (ref 12.0–15.0)
MCH: 27.4 pg (ref 26.0–34.0)
MCHC: 33.8 g/dL (ref 30.0–36.0)
MCV: 81 fL (ref 80.0–100.0)
Platelets: 293 10*3/uL (ref 150–400)
RBC: 3.36 MIL/uL — ABNORMAL LOW (ref 3.87–5.11)
RDW: 24 % — ABNORMAL HIGH (ref 11.5–15.5)
WBC: 6.9 10*3/uL (ref 4.0–10.5)
nRBC: 0 % (ref 0.0–0.2)

## 2022-02-19 LAB — GLUCOSE, CAPILLARY
Glucose-Capillary: 116 mg/dL — ABNORMAL HIGH (ref 70–99)
Glucose-Capillary: 116 mg/dL — ABNORMAL HIGH (ref 70–99)
Glucose-Capillary: 120 mg/dL — ABNORMAL HIGH (ref 70–99)
Glucose-Capillary: 80 mg/dL (ref 70–99)
Glucose-Capillary: 87 mg/dL (ref 70–99)
Glucose-Capillary: 88 mg/dL (ref 70–99)

## 2022-02-19 LAB — HEPARIN LEVEL (UNFRACTIONATED): Heparin Unfractionated: 0.4 IU/mL (ref 0.30–0.70)

## 2022-02-19 MED ORDER — OXYCODONE HCL 5 MG PO TABS
2.5000 mg | ORAL_TABLET | ORAL | Status: DC | PRN
  Administered 2022-02-20: 2.5 mg via ORAL
  Filled 2022-02-19: qty 1

## 2022-02-19 MED ORDER — FLUOXETINE HCL 20 MG PO CAPS
20.0000 mg | ORAL_CAPSULE | Freq: Every day | ORAL | Status: DC
  Administered 2022-02-20 – 2022-02-23 (×4): 20 mg via ORAL
  Filled 2022-02-19 (×4): qty 1

## 2022-02-19 MED ORDER — ORAL CARE MOUTH RINSE: 15.0000 mL | OROMUCOSAL | Status: DC | PRN

## 2022-02-19 MED ORDER — ORAL CARE MOUTH RINSE: 15.0000 mL | OROMUCOSAL | Status: DC

## 2022-02-19 MED ORDER — QUETIAPINE FUMARATE 50 MG PO TABS
50.0000 mg | ORAL_TABLET | Freq: Two times a day (BID) | ORAL | Status: DC
  Administered 2022-02-19 – 2022-02-21 (×4): 50 mg via ORAL
  Filled 2022-02-19 (×4): qty 1

## 2022-02-19 MED ORDER — ACETAMINOPHEN 500 MG PO TABS
1000.0000 mg | ORAL_TABLET | Freq: Three times a day (TID) | ORAL | Status: DC
  Administered 2022-02-19 – 2022-02-23 (×11): 1000 mg via ORAL
  Filled 2022-02-19 (×12): qty 2

## 2022-02-19 MED ORDER — DOCUSATE SODIUM 100 MG PO CAPS
100.0000 mg | ORAL_CAPSULE | Freq: Two times a day (BID) | ORAL | Status: DC
  Administered 2022-02-19 – 2022-02-23 (×5): 100 mg via ORAL
  Filled 2022-02-19 (×7): qty 1

## 2022-02-19 MED ORDER — POLYETHYLENE GLYCOL 3350 17 G PO PACK
17.0000 g | PACK | Freq: Every day | ORAL | Status: DC
  Administered 2022-02-22 – 2022-02-23 (×2): 17 g via ORAL
  Filled 2022-02-19 (×4): qty 1

## 2022-02-19 NOTE — Progress Notes (Signed)
ANTICOAGULATION CONSULT NOTE - Follow Up Consult  Pharmacy Consult for Heparin Indication: ischemic digits  No Active Allergies  Patient Measurements: Height: 5\' 4"  (162.6 cm) Weight: 62.8 kg (138 lb 7.2 oz) IBW/kg (Calculated) : 54.7 Heparin Dosing Weight: 48 kg  Vital Signs: Temp: 98.4 F (36.9 C) (11/27 0715) Temp Source: Oral (11/27 0715) BP: 111/64 (11/27 0715) Pulse Rate: 91 (11/27 0342)  Labs: Recent Labs    02/17/22 0330 02/17/22 1400 02/17/22 1635 02/18/22 0230 02/18/22 0517 02/18/22 0900 02/18/22 1124 02/19/22 0535  HGB 6.7* 7.0*  --  9.1* 9.0*  --   --  9.2*  HCT 21.2* 21.5*  --  27.7* 27.7*  --   --  27.2*  PLT 255 272  --   --  270  --   --  293  HEPARINUNFRC >1.10* >1.10*   < > 0.52  --  >1.10* 0.52 0.40  CREATININE 1.03*  --   --   --  1.07*  --   --  0.96   < > = values in this interval not displayed.     Estimated Creatinine Clearance: 51.8 mL/min (by C-G formula based on SCr of 0.96 mg/dL).   Medical History: Past Medical History:  Diagnosis Date   Bronchitis    COPD (chronic obstructive pulmonary disease) (HCC)    Hypertension     Assessment: 61 yo female presented on 11/6 with AMS found to have e coli bacteremia and mixed cardiogenic/septic shock. Course complicated by ischemic fingers and vascular consulted but unable to use IV heparin due to thrombocytopenia. Noted dark tarry stools and noted somewhat bloody smears on 11/13 per RN. Pharmacy consulted to dose heparin with low goal and no bolus now that plts >50.  Heparin level at goal (0.4) on 950 units/hr. Hgb 9s, plt 293. No bleeding or infusion issues noted.   Goal of Therapy:  Heparin level 0.3-0.5 units/ml Monitor platelets by anticoagulation protocol: Yes   Plan:  Continue heparin at 950 units/hr Daily heparin level for now  Thank you for allowing pharmacy to participate in this patient's care.  12/13 PharmD., BCPS Clinical Pharmacist 02/19/2022 7:46 AM

## 2022-02-19 NOTE — Progress Notes (Signed)
Pt seen by OT for BUE modified resting hand splint checks. Pt declined any increased pain or irritation, bilater splints in great position with distal UEs elevated upon arrival. Splints and stockinette removed with no redness or signs of skin integrity issues noted. Pt resting comfortably. Nsg team to begin alternating L & R splints/stockinette every two hours.

## 2022-02-19 NOTE — Progress Notes (Signed)
OT comments: Pt seen for fabrication of R modified resting hand splint for joint integrity and to provide opportunity for pt to work with adaptive equipment. Family present and verbalized understanding of wear/care schedule. Order placed and schedule placed on wall behind pt's bed. Pt is to wear splints on/off every two hours, alternating R&L, and maintain at night. OT to continue to follow for splint checks. POC remains appropriate.    02/19/22 1716  OT Visit Information  Last OT Received On 02/20/22  Assistance Needed +2  History of Present Illness 60 y.o. female presents to [facility] hospital on 01/29/2022 with AMS and SOB. Pt admitted with shock, AKI. CT abdomen pelvis 17 mm nonobstructive renal calculus without hydronephrosis. Pt intubated on 11/6. Pt developed acute systolic HF. Bronchoscopy 11/14 for worsening PNA. extubated 11/16. PMH includes COPD and HTN.  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  Restrictions  Weight Bearing Restrictions No  Pain Assessment  Pain Assessment Faces  Faces Pain Scale 4  Pain Location B hand/wrist ROM  Pain Descriptors / Indicators Grimacing;Guarding  Pain Intervention(s) Limited activity within patient's tolerance;Monitored during session  Cognition  Arousal/Alertness Awake/alert  Behavior During Therapy WFL for tasks assessed/performed  Overall Cognitive Status Impaired/Different from baseline  Area of Impairment Attention;Safety/judgement;Awareness;Problem solving;Following commands  Current Attention Level Selective  Following Commands Follows one step commands consistently  Safety/Judgement Decreased awareness of deficits  Awareness Emergent  Problem Solving Slow processing;Decreased initiation;Requires verbal cues  General Comments slow processing; decreased reasoing;  will further assess  Upper Extremity Assessment  RUE Deficits / Details thumb- IP contracted @ 90 flexion/no active  movemetns; able to range MP and CMC; minimal active movemtn however able to achieve opposition; index - ring -  apparent distal necrosis; ableto range index - ring through funcitonal ROM; little most affectedwiht limited ROM of DIP; tightness noted in all flexors with apparent tenodesis grasp; wrist ROM limited however overall WFL passively; positions wiht wrist flexed @ 90 with overstretching of extensors. able to facilitate active wrist extension with gravity eliminated; able to achieve funcitonal ROM elbow and shoulder - pain with full elbow extension  RUE Sensation decreased light touch;decreased proprioception  RUE Coordination decreased fine motor;decreased gross motor  LUE Deficits / Details more active movemetn overall with L hand however iincreased distal necrosis and contractures of IPs; gross grasp/extension overall WFL within limitations of contractures; increased active movemetn L and as gross grasp; able to maintain grasp on cloth when wrist supported; thumb - IP flexed adn contracted - thumb worse on R dominanat hand  LUE Sensation decreased light touch;decreased proprioception  LUE Coordination decreased fine motor  ADL  Overall ADL's  Needs assistance/impaired  General ADL Comments session focused on splint fabrication  General Comments  General comments (skin integrity, edema, etc.) R modified resting hand splint fabricated. Foam added to necrotic areas. stockingette provided for skin integrity. family present for ROM and splinting education  OT - End of Session  Equipment Utilized During Treatment Other (comment) (splinting materials)  Activity Tolerance Patient tolerated treatment well  Patient left in bed;with call bell/phone within reach;with family/visitor present  Nurse Communication Other (comment)  OT Assessment/Plan  OT Plan Discharge plan remains appropriate;Frequency needs to be updated  OT Visit Diagnosis Unsteadiness on feet (R26.81);Muscle weakness (generalized)  (M62.81);Other abnormalities of gait and mobility (R26.89);History of falling (Z91.81);Pain;Adult, failure to thrive (R62.7)  OT Frequency (ACUTE ONLY) Min 4X/week  Follow Up Recommendations Skilled nursing-short term rehab (<3 hours/day)  Assistance recommended at discharge Frequent or constant Supervision/Assistance  Patient can return home with the following Two people to help with walking and/or transfers;Two people to help with bathing/dressing/bathroom;Direct supervision/assist for medications management;Direct supervision/assist for financial management;Assistance with feeding;Assistance with cooking/housework;Assist for transportation;Help with stairs or ramp for entrance  OT Equipment Other (comment) (TBD)  AM-PAC OT "6 Clicks" Daily Activity Outcome Measure (Version 2)  Help from another person eating meals? 1  Help from another person taking care of personal grooming? 1  Help from another person toileting, which includes using toliet, bedpan, or urinal? 1  Help from another person bathing (including washing, rinsing, drying)? 1  Help from another person to put on and taking off regular upper body clothing? 1  Help from another person to put on and taking off regular lower body clothing? 1  6 Click Score 6  Progressive Mobility  What is the highest level of mobility based on the progressive mobility assessment? Level 1 (Bedfast) - Unable to balance while sitting on edge of bed  Acute Rehab OT Goals  Patient Stated Goal to pick up things  OT Goal Formulation With patient  Time For Goal Achievement 02/28/22  Potential to Achieve Goals Good  ADL Goals  Pt Will Perform Eating with mod assist;with adaptive utensils;bed level;with assist to don/doff brace/orthosis  Pt Will Perform Grooming with mod assist;with adaptive equipment;bed level  Pt/caregiver will Perform Home Exercise Program Increased ROM;Increased strength;Both right and left upper extremity;With minimal assist;With written  HEP provided  Additional ADL Goal #1 Pt will tolerate unsupported sitting with min G x10 minutes as a precursor to ADLs  Additional ADL Goal #2 Pt will indep verbalized splinting schedule and splint management to staff and family  Additional ADL Goal #3 Pt will tolerate B hand splints for 2 hour on/2 hours off scheudle to improve funcitonal positoining of Hands  Additional ADL Goal #4 Pt will complete hand to mouth pattern with adapted device/orthotic in preparation for self feeding  OT Time Calculation  OT Start Time (ACUTE ONLY) 1345  OT Stop Time (ACUTE ONLY) 1456  OT Time Calculation (min) 71 min  OT General Charges  $OT Visit 1 Visit  OT Treatments  $Therapeutic Exercise 8-22 mins  $Orthotics Fit/Training 53-67 mins  $ Splint materials basic 1 Supply  $ OT Supplies 1 Supply

## 2022-02-19 NOTE — Progress Notes (Signed)
OT impression: Pt seen at bed level for BUE AAROM/PROM exercises to assess need for splinting and functional needs. She tolerated exercises listed below with no adverse reaction. Educated pt on elevated/extended positioning of BUEs when resting and to avoid extreme flexion. Pt demonstrated great ability to self-adjust. POC remains appropriate.     02/19/22 1700  OT Visit Information  Last OT Received On 02/19/22  Assistance Needed +2  History of Present Illness 61 y.o. female presents to [facility] hospital on 01/29/2022 with AMS and SOB. Pt admitted with shock, AKI. CT abdomen pelvis 17 mm nonobstructive renal calculus without hydronephrosis. Pt intubated on 11/6. Pt developed acute systolic HF. Bronchoscopy 11/14 for worsening PNA. extubated 11/16. PMH includes COPD and HTN.  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  Restrictions  Weight Bearing Restrictions No  Pain Assessment  Faces Pain Scale 4  Pain Location B hand/wrist ROM  Pain Descriptors / Indicators Grimacing;Guarding  Pain Intervention(s) Limited activity within patient's tolerance;Monitored during session  Cognition  Arousal/Alertness Awake/alert  Behavior During Therapy WFL for tasks assessed/performed  Overall Cognitive Status Impaired/Different from baseline  Area of Impairment Attention;Safety/judgement;Awareness;Problem solving;Following commands  Current Attention Level Selective  Following Commands Follows one step commands consistently  Safety/Judgement Decreased awareness of deficits  Awareness Emergent  Problem Solving Slow processing;Decreased initiation;Requires verbal cues  General Comments slow processing; decreased reasoing;  will further assess  Upper Extremity Assessment  RUE Deficits / Details thumb- IP contracted @ 90 flexion/no active movemetns; able to range MP and CMC; minimal active movemtn however able to achieve opposition; index - ring -   apparent distal necrosis; ableto range index - ring through funcitonal ROM; little most affectedwiht limited ROM of DIP; tightness noted in all flexors with apparent tenodesis grasp; wrist ROM limited however overall WFL passively; positions wiht wrist flexed @ 90 with overstretching of extensors. able to facilitate active wrist extension with gravity eliminated; able to achieve funcitonal ROM elbow and shoulder - pain with full elbow extension  RUE Sensation decreased light touch;decreased proprioception  RUE Coordination decreased fine motor;decreased gross motor  LUE Deficits / Details more active movemetn overall with L hand however iincreased distal necrosis and contractures of IPs; gross grasp/extension overall WFL within limitations of contractures; increased active movemetn L and as gross grasp; able to maintain grasp on cloth when wrist supported; thumb - IP flexed adn contracted - thumb worse on R dominanat hand  LUE Sensation decreased light touch;decreased proprioception  LUE Coordination decreased fine motor  Lower Extremity Assessment  Lower Extremity Assessment Defer to PT evaluation  Vision- Assessment  Vision Assessment? No apparent visual deficits  Praxis  Praxis Not tested  ADL  Overall ADL's  Needs assistance/impaired  Grooming Total assistance;Bed level  Grooming Details (indicate cue type and reason) practiced bringing items from hand to mouth/head  Functional mobility during ADLs Total assistance;+2 for physical assistance;+2 for safety/equipment  General ADL Comments session focused on AAROM/PROM of BUEs  Bed Mobility  Overal bed mobility Needs Assistance  General bed mobility comments exercises completed while supported supine in bed  Transfers  Overall transfer level Needs assistance  General transfer comment deferred  Exercises  Exercises Hand exercises;General Upper Extremity  General Exercises - Upper Extremity  Elbow Extension AROM;Strengthening;Right;15  reps;Supine  Shoulder Flexion AROM;Strengthening;Right;15 reps;Supine;Both  Elbow Flexion AROM;Strengthening;Right;15 reps;Supine;Both  Wrist Flexion AAROM;15 reps  Wrist Extension AAROM;PROM;Right;Both;15 reps  Digit Composite Flexion AROM;AAROM;PROM;Both;15 reps  Composite Extension AROM;PROM;AAROM;Strengthening;Both;15 reps  Hand Exercises  Wrist Extension AAROM;Both;10 reps;Supine  Forearm Supination AAROM;Both;5 reps;Supine  Forearm Pronation AAROM;Both;5 reps;Supine  OT - End of Session  Activity Tolerance Patient tolerated treatment well  Patient left in bed;with call bell/phone within reach;with family/visitor present  Nurse Communication Other (comment) (splint wearing schedule/care  )  OT Assessment/Plan  OT Plan Discharge plan remains appropriate;Frequency needs to be updated  OT Visit Diagnosis Unsteadiness on feet (R26.81);Muscle weakness (generalized) (M62.81);Other abnormalities of gait and mobility (R26.89);History of falling (Z91.81);Pain;Adult, failure to thrive (R62.7)  OT Frequency (ACUTE ONLY) Min 4X/week  Follow Up Recommendations Skilled nursing-short term rehab (<3 hours/day)  Assistance recommended at discharge Frequent or constant Supervision/Assistance  Patient can return home with the following Two people to help with walking and/or transfers;Two people to help with bathing/dressing/bathroom;Direct supervision/assist for medications management;Direct supervision/assist for financial management;Assistance with feeding;Assistance with cooking/housework;Assist for transportation;Help with stairs or ramp for entrance  OT Equipment Other (comment) (TBA  )  AM-PAC OT "6 Clicks" Daily Activity Outcome Measure (Version 2)  Help from another person eating meals? 1  Help from another person taking care of personal grooming? 1  Help from another person toileting, which includes using toliet, bedpan, or urinal? 1  Help from another person bathing (including washing,  rinsing, drying)? 1  Help from another person to put on and taking off regular upper body clothing? 1  Help from another person to put on and taking off regular lower body clothing? 1  6 Click Score 6  Progressive Mobility  What is the highest level of mobility based on the progressive mobility assessment? Level 1 (Bedfast) - Unable to balance while sitting on edge of bed  Acute Rehab OT Goals  OT Goal Formulation With patient  Time For Goal Achievement 02/28/22  Potential to Achieve Goals Good  ADL Goals  Pt Will Perform Eating with mod assist;with adaptive utensils;bed level;with assist to don/doff brace/orthosis  Pt Will Perform Grooming with mod assist;with adaptive equipment;bed level  Pt/caregiver will Perform Home Exercise Program Increased ROM;Increased strength;Both right and left upper extremity;With minimal assist;With written HEP provided  Additional ADL Goal #1 Pt will tolerate unsupported sitting with min G x10 minutes as a precursor to ADLs  Additional ADL Goal #2 Pt will indep verbalized splinting schedule and splint management to staff and family  Additional ADL Goal #3 Pt will tolerate B hand splints for 2 hour on/2 hours off [place]ud[place] to improve funcitonal positoining of Hands  Additional ADL Goal #4 Pt will complete hand to mouth pattern with adapted device/orthotic in preparation for self feeding  OT Time Calculation  OT Start Time (ACUTE ONLY) 0935  OT Stop Time (ACUTE ONLY) 0955  OT Time Calculation (min) 20 min  OT General Charges  $OT Visit 1 Visit  OT Treatments  $Therapeutic Exercise 8-22 mins  Perception  Perception Not tested

## 2022-02-19 NOTE — Progress Notes (Signed)
Occupational Therapy Treatment Patient Details Name: Nicole Cunningham MRN: [id number] DOB: 1960-11-06 Today's Date: 02/19/2022   History of present illness 61 y.o. female presents to [facility] hospital on 01/29/2022 with AMS and SOB. Pt admitted with shock, AKI. CT abdomen pelvis 17 mm nonobstructive renal calculus without hydronephrosis. Pt intubated on 11/6. Pt developed acute systolic HF. Bronchoscopy 11/14 for worsening PNA. extubated 11/16. PMH includes COPD and HTN.   OT comments  Pt seen for fabrication of L modified resting hand splint to improve functional positioning of hand, reduce risk of further contractures and provide base for adaptive devices to work toward using hands in a functional capacity. Family present throughout session and educated on purpose and wearing time of splint. An additional splint will be fabricated for the R hand. Pt to wear splints for 2 hours on/2 hours off. Alternating splints so that 1 hand is free at all times - nsg notified and order placed. Will modify order as pt tolerates splints.    Recommendations for follow up therapy are one component of a multi-disciplinary discharge planning process, led by the attending physician.  Recommendations may be updated based on patient status, additional functional criteria and insurance authorization.    Follow Up Recommendations  Skilled nursing-short term rehab (<3 hours/day)     Assistance Recommended at Discharge Frequent or constant Supervision/Assistance  Patient can return home with the following  Two people to help with walking and/or transfers;Two people to help with bathing/dressing/bathroom;Direct supervision/assist for medications management;Direct supervision/assist for financial management;Assistance with feeding;Assistance with cooking/housework;Assist for transportation;Help with stairs or ramp for entrance   Equipment Recommendations  Other (comment) (TBA  )    Recommendations for Other Services       Precautions / Restrictions Precautions Precautions: Fall;Other (comment) Precaution Comments: necrotic digits in all extremities Restrictions Weight Bearing Restrictions: No       Mobility Bed Mobility Overal bed mobility: Needs Assistance             General bed mobility comments: exercises completed while supported supine in bed    Transfers Overall transfer level: Needs assistance                 General transfer comment: deferred     Balance                                           ADL either performed or assessed with clinical judgement   ADL    Extremity/Trunk Assessment Upper Extremity Assessment Upper Extremity Assessment: RUE deficits/detail;LUE deficits/detail (Simultaneous filing. User may not have seen previous data.) RUE Deficits / Details: thumb- IP contracted @ 90 flexion/no active movemetns; able to range MP and CMC; minimal active movemtn however able to achieve opposition; index - ring -  apparent distal necrosis; ableto range index - ring through funcitonal ROM; little most affectedwiht limited ROM of DIP; tightness noted in all flexors with apparent tenodesis grasp; wrist ROM limited however overall WFL passively; positions wiht wrist flexed @ 90 with overstretching of extensors. able to facilitate active wrist extension with gravity eliminated; able to achieve funcitonal ROM elbow and shoulder - pain with full elbow extension RUE Sensation: decreased light touch;decreased proprioception RUE Coordination: decreased fine motor;decreased gross motor LUE Deficits / Details: more active movemetn overall with L hand however iincreased distal necrosis and contractures of IPs; gross grasp/extension overall WFL within limitations of contractures;  increased active movemetn L and as gross grasp; able to maintain grasp on cloth when wrist supported; thumb - IP flexed adn contracted - thumb worse on R dominanat hand LUE Sensation: decreased  light touch;decreased proprioception LUE Coordination: decreased fine motor            Vision   Vision Assessment?: No apparent visual deficits   Perception Perception Perception: Not tested   Praxis Praxis Praxis: Not tested    Cognition Arousal/Alertness: Awake/alert Behavior During Therapy: WFL for tasks assessed/performed Overall Cognitive Status: Impaired/Different from baseline Area of Impairment: Attention, Safety/judgement, Awareness, Problem solving, Following commands                   Current Attention Level: Selective   Following Commands: Follows one step commands consistently Safety/Judgement: Decreased awareness of deficits Awareness: Emergent Problem Solving: Slow processing, Decreased initiation, Requires verbal cues General Comments: slow processing; decreased reasoing;  will further assess        Exercises Exercises: General Upper Extremity General Exercises - Upper Extremity Shoulder Flexion: AROM, Strengthening, Right, 15 reps, Supine, Both Elbow Flexion: AROM, Strengthening, Right, 15 reps, Supine, Both Elbow Extension: AROM, Strengthening, Right, 15 reps, Supine Wrist Flexion: AAROM, 15 reps Wrist Extension: AAROM, PROM, Right, Both, 15 reps Digit Composite Flexion: AROM, AAROM, PROM, Both, 15 reps Composite Extension: AROM, PROM, AAROM, Strengthening, Both, 15 reps    Shoulder Instructions       General Comments L modified resting splint fabricated; padding placed distal part of hand base for digits and at thumb pad; stockinette placed over non-adherant dressing on dorsal surface of arm (Simultaneous filing. User may not have seen previous data.)    Pertinent Vitals/ Pain       Pain Assessment Pain Assessment: Faces (Simultaneous filing. User may not have seen previous data.) Faces Pain Scale: Hurts little more Pain Location: B hand/wrist ROM Pain Descriptors / Indicators: Grimacing, Guarding Pain Intervention(s): Limited  activity within patient's tolerance (Simultaneous filing. User may not have seen previous data.)  Home Living                                          Prior Functioning/Environment              Frequency  Min 4X/week        Progress Toward Goals  OT Goals(current goals can now be found in the care plan section)  Progress towards OT goals: Progressing toward goals (Simultaneous filing. User may not have seen previous data.)  Acute Rehab OT Goals Patient Stated Goal: to pick up things OT Goal Formulation: With patient Time For Goal Achievement: 02/28/22 Potential to Achieve Goals: Good ADL Goals Pt Will Perform Eating: with mod assist;with adaptive utensils;bed level;with assist to don/doff brace/orthosis Pt Will Perform Grooming: with mod assist;with adaptive equipment;bed level Pt/caregiver will Perform Home Exercise Program: Increased ROM;Increased strength;Both right and left upper extremity;With minimal assist;With written HEP provided Additional ADL Goal #1: Pt will tolerate unsupported sitting with min G x10 minutes as a precursor to ADLs Additional ADL Goal #2: Pt will indep verbalized splinting schedule and splint management to staff and family  Plan Discharge plan remains appropriate;Frequency needs to be updated    Co-evaluation                 AM-PAC OT "6 Clicks" Daily Activity     Outcome Measure  Help from another person eating meals?: Total Help from another person taking care of personal grooming?: Total Help from another person toileting, which includes using toliet, bedpan, or urinal?: Total Help from another person bathing (including washing, rinsing, drying)?: Total Help from another person to put on and taking off regular upper body clothing?: Total Help from another person to put on and taking off regular lower body clothing?: A Lot 6 Click Score: 7    End of Session    OT Visit Diagnosis: Unsteadiness on feet  (R26.81);Muscle weakness (generalized) (M62.81);Other abnormalities of gait and mobility (R26.89);History of falling (Z91.81);Pain;Adult, failure to thrive (R62.7)   Activity Tolerance Patient tolerated treatment well   Patient Left in bed;with call bell/phone within reach;with family/visitor present   Nurse Communication Other (comment) (splint wearing schedule/care  )        Time: [id number] OT Time Calculation (min): 60 min  Charges: OT General Charges $OT Visit: 1 Visit OT Treatments $Therapeutic Exercise: 8-22 mins $Orthotics Fit/Training: 38-52 mins $ Splint materials basic: 1 Supply $ OT Supplies: [place], OT/L   Acute OT Clinical Specialist Acute Rehabilitation Services Pager [phone] Office [phone]   [facility] 02/19/2022, 3:35 PM

## 2022-02-19 NOTE — Progress Notes (Signed)
PROGRESS NOTE    Nicole Cunningham  TTS:[id number] DOB: [birth year] DOA: 01/28/2022 PCP: [name], MD   Brief Narrative:  Patient is a 59 old female with history of COPD, anemia, hypertension who was brought to the emergency department on 11/6 with altered mental status, shortness of breath.  On presentation, lab work showed creatinine of 4.1, lactic acid of 9.  CT head, chest x-ray did not show any acute findings.  CT abdomen/pelvis showed 17 mm nonobstructing renal calculus without hydronephrosis.  Patient was admitted for the management of septic shock under ICU service.  Patient had a prolonged hospital course, found to have E. coli bacteremia, echo showed EF of 20 to 25% for which cardiology consulted.  Patient was managed for cardiogenic/septic shock.  Transferred to [facility] service on 11/18.   Significant events as per PCCM :   11/6 septic shock on levo , ETT >> 11/6 Echo EF 20 to 25% global hypokinesis, apex intact 11/6 RIJ HD cath >> dobutamine at 5 mics 11/7>>Dobutamine increased to 7.5 mics , 1 unit PRBC and 1 unit platelet transfusion 11/8 dobutamine increased to 10 mics 11/10 dobut wean, diuretic, pigtail  11/13 restarted levophed overnight , Short run of SVT, weaned Dobutamine to 3 , repleted K, 1 Unit PRBC per cards ( HGB 7.2)  Assessment & Plan:   Principal Problem:   Septic shock (HCC) Active Problems:   Sepsis (HCC)   Acute encephalopathy   Pressure injury of skin  Shock: Mixed cardiogenic and septic shock.  Suspicion for sepsis/stress cardiomyopathy.   Dobutamine stopped.  Off vasopressors. Repeat echo shows 70% ejection fraction/hyperdynamic left ventricular.  No plans for cardiac cath.   Dysphagia: Patient underwent MBS on 02/14/2022 and was started on dysphagia 1 diet, SLP following, advance to dysphagia 2 diet.  Patient is very slowly progressing and she remains at risk of requiring alternative means of nutrition such as PEG tube placement but patient is  completely against that as well.   Bacteremia: Blood culture/urine culture showed E. coli.  Patient completed 10 days of Merrem antibiotics.   AKI: Likely from ATN from shock. She is getting some IV fluids ordered by cardiology, her creatinine has improved.   Pancytopenia/microcytic anemia.: Thought to be sepsis related.  She has thus far received 5 units of PRBC transfusion and 2 units of platelet transfusion during this hospitalization.  Hemoglobin dropped to the range of 6 and she received 1 unit of PRBC transfusion on 02/11/2022.  Hemoglobin once again dropped to 6.7 on 02/17/2022 and 1 unit of PRBC transfusion has been ordered.  Hemoglobin over 9 today.  Monitor closely.  Unfortunately, she needs heparin as well due to her severe upper extremity ischemia and dry gangrene.   Acute hypoxic respiratory failure secondary to multifocal pneumonia: History of COPD.  Currently on Brovana, Pulmicort, DuoNeb. currently on 2 L of oxygen via nasal cannula.  Tracheal aspirate has shown Enterobacter cloacae.  Continue current antibiotics. Patient was also found to have left parapneumonic effusion.  Status post pigtail placement, now has been removed.  Extubated on 11/16 with no intention to intubate.   Acute systolic CHF: Heart failure team following.  Echo showed EF of 20 to 25% with severe hypokinesis, normal RV, dilated IVC.  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  She is getting some IV fluids again today.  Cardiology managing.   Septic encephalopathy:   CT head on presentation  did not show any acute findings.  Currently unable to swallow, speech therapy following and recommending n.p.o.  Currently on tube feeding.  Speech therapy should continue to follow and evaluate her swallowing so that the feeding can be discontinued.  Started on Seroquel for agitation by PCCM team.  Might need to be  titrated. Mental status has significantly improved and she is currently alert and oriented.  Follows commands.     Upper extremity ischemia with dry gangrene: Present on admission.  Worse on the right side.  Also suspected to have worsening from pressures causing vaso constriction.Vascular surgery following.  Currently on low-dose heparin.  No plan for intervention.  Rx for autoamputation.  Vascular surgery following.  Per them, her hands are nonsalvageable and patient may need evaluation by hand surgery at some point in time however patient carries very poor prognosis with multiple comorbidities.  I had a very lengthy and frank discussion with the patient while her husband and son were present.  She has been informed by palliative care yesterday and I reiterated that unfortunately, her hands are nonsalvageable.  She does not want amputation of the hands.  I broached the topic of possibly transitioning to hospice.  The family would discuss with each other and will let us know.   Hypokalemia/hypernatremia: Sodium and potassium both normalized, reduced free water boluses frequency from every 4 hours to every 8 hours.   Nonobstructing ureteral stone: We recommend follow-up with urology as an outpatient.  Thrombocytopenia: Likely sepsis driven.  Improved.   Goals of care/deconditioning/debility: Palliative care closely following.  Currently DNR.   DVT prophylaxis: Place and maintain sequential compression device Start: 01/29/22 1021   Code Status: DNR  Family Communication:  None present at bedside.  Status is: Inpatient Remains inpatient appropriate because: Patient very sick.     Estimated body mass index is 23.76 kg/m as calculated from the following:   Height as of this encounter: 5\' 4"  (1.626 m).   Weight as of this encounter: 62.8 kg.  Pressure Injury 02/08/22 Buttocks Right Stage 2 -  Partial thickness loss of dermis presenting as a shallow open injury with a red, pink wound bed without  slough. (Active)  02/08/22 0800  Location: Buttocks  Location Orientation: Right  Staging: Stage 2 -  Partial thickness loss of dermis presenting as a shallow open injury with a red, pink wound bed without slough.  Wound Description (Comments):   Present on Admission:   Dressing Type Foam - Lift dressing to assess site every shift 02/18/22 1945   Nutritional Assessment: Body mass index is 23.76 kg/m.02/20/22 Seen by dietician.  I agree with the assessment and plan as outlined below: Nutrition Status: Nutrition Problem: Severe Malnutrition Etiology: chronic illness (COPD) Signs/Symptoms: severe muscle depletion, severe fat depletion Interventions: Tube feeding  . Skin Assessment: I have examined the patient's skin and I agree with the wound assessment as performed by the wound care RN as outlined below: Pressure Injury 02/08/22 Buttocks Right Stage 2 -  Partial thickness loss of dermis presenting as a shallow open injury with a red, pink wound bed without slough. (Active)  02/08/22 0800  Location: Buttocks  Location Orientation: Right  Staging: Stage 2 -  Partial thickness loss of dermis presenting as a shallow open injury with a red, pink wound bed without slough.  Wound Description (Comments):   Present on Admission:   Dressing Type Foam - Lift dressing to assess site every shift 02/18/22 1945    Consultants:  Cardiology  Vascular surgery Palliative care Procedures:  As above  Antimicrobials:  Anti-infectives (From admission, onward)    Start     Dose/Rate Route Frequency Ordered Stop   02/04/22 1345  meropenem (MERREM) 1 g in sodium chloride 0.9 % 100 mL IVPB        1 g 200 mL/hr over 30 Minutes Intravenous Every 12 hours 02/04/22 1258 02/13/22 2350   02/01/22 2200  ceFAZolin (ANCEF) IVPB 2g/100 mL premix  Status:  Discontinued        2 g 200 mL/hr over 30 Minutes Intravenous Every 12 hours 02/01/22 0754 02/04/22 1258   01/29/22 2200  ceFEPIme (MAXIPIME) 1 g in sodium  chloride 0.9 % 100 mL IVPB  Status:  Discontinued        1 g 200 mL/hr over 30 Minutes Intravenous Every 24 hours 01/29/22 0046 01/29/22 0242   01/29/22 2200  cefTRIAXone (ROCEPHIN) 2 g in sodium chloride 0.9 % 100 mL IVPB  Status:  Discontinued        2 g 200 mL/hr over 30 Minutes Intravenous Every 24 hours 01/29/22 0242 02/01/22 0754   01/29/22 0048  vancomycin variable dose per unstable renal function (pharmacist dosing)  Status:  Discontinued         Does not apply See admin instructions 01/29/22 0048 01/29/22 1343   01/28/22 2230  ceFEPIme (MAXIPIME) 2 g in sodium chloride 0.9 % 100 mL IVPB        2 g 200 mL/hr over 30 Minutes Intravenous  Once 01/28/22 2228 01/28/22 2350   01/28/22 2230  metroNIDAZOLE (FLAGYL) IVPB 500 mg        500 mg 100 mL/hr over 60 Minutes Intravenous  Once 01/28/22 2228 01/29/22 0021   01/28/22 2230  vancomycin (VANCOCIN) IVPB 1000 mg/200 mL premix        1,000 mg 200 mL/hr over 60 Minutes Intravenous  Once 01/28/22 2228 01/29/22 0150         Subjective:  Seen and examined.  No new complaint. Objective: Vitals:   02/19/22 0342 02/19/22 0715 02/19/22 0719 02/19/22 0835  BP:  111/64    Pulse: 91     Resp: 20 (!) 25 (!) 21   Temp: 98.1 F (36.7 C) 98.4 F (36.9 C)    TempSrc: Oral Oral    SpO2: 100%   100%  Weight:      Height:        Intake/Output Summary (Last 24 hours) at 02/19/2022 1000 Last data filed at 02/19/2022 0418 Gross per 24 hour  Intake 60 ml  Output 1700 ml  Net -1640 ml    Filed Weights   02/15/22 0406 02/16/22 0431 02/18/22 0431  Weight: 56.5 kg 59.1 kg 62.8 kg    Examination:  General exam: Appears calm and comfortable  Respiratory system: Clear to auscultation. Respiratory effort normal. Cardiovascular system: S1 & S2 heard, RRR. No JVD, murmurs, rubs, gallops or clicks.  +3 pitting edema bilateral lower extremity, +2 pitting edema bilateral upper extremities. Gastrointestinal system: Abdomen is nondistended, soft  and nontender. No organomegaly or masses felt. Normal bowel sounds heard. Central nervous system: Alert and oriented. No focal neurological deficits. Extremities: Symmetric 5 x 5 power.  Gangrenous fingers both hands Skin: No rashes, lesions or ulcers.   Data Reviewed: I have personally reviewed following labs and imaging studies  CBC: Recent Labs  Lab 02/16/22 0400 02/17/22 0330 02/17/22 1400 02/18/22 0230 02/18/22 0517 02/19/22 0535  WBC 6.9 7.2 7.4  --  7.1  6.9  HGB 7.2* 6.7* 7.0* 9.1* 9.0* 9.2*  HCT 22.1* 21.2* 21.5* 27.7* 27.7* 27.2*  MCV 81.9 81.5 80.5  --  82.0 81.0  PLT 275 255 272  --  270 293    Basic Metabolic Panel: Recent Labs  Lab 02/15/22 0500 02/16/22 0400 02/17/22 0330 02/18/22 0517 02/19/22 0535  NA 141 144 139 139 136  K 4.0 3.9 4.1 3.9 4.0  CL 116* 116* 115* 115* 112*  CO2 17* 17* 18* 18* 18*  GLUCOSE 113* 120* 119* 121* 120*  BUN 42* 37* 31* 27* 27*  CREATININE 1.23* 1.09* 1.03* 1.07* 0.96  CALCIUM 8.1* 8.2* 8.0* 8.3* 8.1*    GFR: Estimated Creatinine Clearance: 51.8 mL/min (by C-G formula based on SCr of 0.96 mg/dL). Liver Function Tests: No results for input(s): "AST", "ALT", "ALKPHOS", "BILITOT", "PROT", "ALBUMIN" in the last 168 hours.  No results for input(s): "LIPASE", "AMYLASE" in the last 168 hours. No results for input(s): "AMMONIA" in the last 168 hours. Coagulation Profile: No results for input(s): "INR", "PROTIME" in the last 168 hours. Cardiac Enzymes: No results for input(s): "CKTOTAL", "CKMB", "CKMBINDEX", "TROPONINI" in the last 168 hours. BNP (last 3 results) No results for input(s): "PROBNP" in the last 8760 hours. HbA1C: No results for input(s): "HGBA1C" in the last 72 hours. CBG: Recent Labs  Lab 02/18/22 1652 02/18/22 1951 02/18/22 2317 02/19/22 0415 02/19/22 0805  GLUCAP 96 108* 107* 116* 87    Lipid Profile: No results for input(s): "CHOL", "HDL", "LDLCALC", "TRIG", "CHOLHDL", "LDLDIRECT" in the last 72  hours. Thyroid Function Tests: No results for input(s): "TSH", "T4TOTAL", "FREET4", "T3FREE", "THYROIDAB" in the last 72 hours. Anemia Panel: No results for input(s): "VITAMINB12", "FOLATE", "FERRITIN", "TIBC", "IRON", "RETICCTPCT" in the last 72 hours. Sepsis Labs: No results for input(s): "PROCALCITON", "LATICACIDVEN" in the last 168 hours.  No results found for this or any previous visit (from the past 240 hour(s)).    Radiology Studies: No results found.  Scheduled Meds:  sodium chloride   Intravenous Once   acetaminophen  1,000 mg Per Tube TID   amLODipine  5 mg Oral Daily   arformoterol  15 mcg Nebulization BID   budesonide (PULMICORT) nebulizer solution  0.25 mg Nebulization BID   carvedilol  3.125 mg Oral BID WC   Chlorhexidine Gluconate Cloth  6 each Topical Q0600   docusate  100 mg Per Tube BID   feeding supplement  237 mL Oral BID BM   FLUoxetine  20 mg Per Tube Daily   free water  300 mL Per Tube Q8H   insulin aspart  0-6 Units Subcutaneous Q4H   mouth rinse  15 mL Mouth Rinse 4 times per day   mouth rinse  15 mL Mouth Rinse 4 times per day   polyethylene glycol  17 g Per Tube Daily   QUEtiapine  50 mg Per Tube BID   sodium chloride flush  10-40 mL Intracatheter Q12H   Continuous Infusions:  sodium chloride 10 mL/hr at 02/12/22 0600   sodium chloride Stopped (02/09/22 2311)   feeding supplement (VITAL 1.5 CAL) Stopped (02/19/22 0610)   heparin 950 Units/hr (02/19/22 0531)     LOS: 21 days   Hughie Closs, MD [facility]  02/19/2022, 10:00 AM   *Please note that this is a verbal dictation therefore any spelling or grammatical errors are due to the "Dragon [employer] One" system interpretation.  Please page via Amion and do not message via secure chat for urgent patient care matters.  Secure chat can be used for non urgent patient care matters.  How to contact the [facility] Attending or Consulting provider 7A - 7P or covering provider during after hours 7P  -7A, for this patient?  Check the care team in [facility] and look for a) attending/consulting TRH provider listed and b) the [facility] team listed. Page or secure chat 7A-7P. Log into [email or web address] and use [facility] universal password to access. If you do not have the password, please contact the hospital operator. Locate the [facility] provider you are looking for under [facility] and page to a number that you can be directly reached. If you still have difficulty reaching the provider, please page the [facility] (Director on Call) for the Hospitalists listed on amion for assistance.

## 2022-02-19 NOTE — Progress Notes (Signed)
Palliative Medicine Progress Note   Patient Name: Nicole Cunningham       Date: 02/19/2022 DOB: [birth year]  Age: 61 y.o. MRN#: [id number] Attending Physician: Hughie Closs, MD Primary Care Physician: [name], MD Admit Date: 01/28/2022   HPI/Patient Profile: 61 y.o. female  with past medical history of COPD, anemia, HTN presents to [facility] ED on 11/6 with AMS. The patient recently admitted to [facility] with AMS on 11/2021, found to have UTI and pyelonephritis during that admission.  On 11/5, patient having increased confusion.  Having some SOB over the past couple of days.  Brought to [facility] ED for further eval. Upon arrival to [facility] ED, patient confused but able to state name.  Admitted with shock, AKI, lactic acidosis of 9.0  She was found to have E. coli bacteremia and EF 20 to 25% on echo, mixed cardiogenic and septic shock. 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 Vascular surgery recommending heparin.    PMT was consulted for GOC conversations.  Subjective: Chart reviewed and patient assessed.  She is alert, and eating her lunch with assistance from her son.  She reports her appetite is good, but appears to have eaten very little before stating she is full.  She reports intermittent pain in her hands, but that it is relieved with prn pain medication.  Current clinical status reviewed. Patient confirms that she does not want amputation of her hands.  Her husband is at bedside and states he supports this decision.  They both understand this will be a terminal event.  They both speak to the importance of quality of life over quantity.  Patient feels the loss of her hands will result in loss of her independence, which would not be consistent with good quality of life for her.  Husband reports that  he has been doing his own research, and has learned that major amputation is associated with increased mortality and morbidity.  Patient and husband express their goal is for her to discharge home when she is medically optimized.  She wants to be able to enjoy the time she has left, what ever that timing looks like. Provided brief education on the philosophy and benefits of hospice care. Discussed that it is about supporting the patient where they are while allowing the natural course to occur.  Patient and  husband confirmed that they are interested in home hospice and would like to have more information on the services offered.  I let patient and family know that I would follow-up tomorrow.    Objective:  Physical Exam Vitals reviewed.  Constitutional:      General: She is not in acute distress.    Appearance: She is ill-appearing.  Pulmonary:     Effort: Pulmonary effort is normal.  Skin:    Comments: Dry gangrene to bilateral fingers/hands with dark discoloration extending to mid forearm  Neurological:     Mental Status: She is alert and oriented to person, place, and time.  Psychiatric:        Behavior: Behavior normal.             Vital Signs: BP 133/78 (BP Location: Left Leg)   Pulse 85   Temp 97.9 F (36.6 C) (Oral)   Resp (!) 22   Ht 5\' 4"  (1.626 m)   Wt 62.8 kg   SpO2 93%   BMI 23.76 kg/m  SpO2: SpO2: 93 % O2 Device: O2 Device: Nasal Cannula O2 Flow Rate: O2 Flow Rate (L/min): 2 L/min   LBM: Last BM Date : 02/17/22    Palliative Medicine Assessment & Plan   Assessment: Principal Problem:   Septic shock (HCC) Active Problems:   Sepsis (HCC)   Acute encephalopathy   Pressure injury of skin    Recommendations/Plan: DNR/DNI Patient declines amputation Continue current supportive interventions Continue oxycodone IR 2.5 mg every 4 hours as needed for pain Patient and husband are interested in discharge home with hospice when she is medically  optimized PMT will continue to follow  Prognosis:  < 6 months  Discharge Planning: To Be Determined  Care plan was discussed with Dr. 02/19/22, [facility], and RN (via secure chat)  Thank you for allowing the Palliative Medicine Team to assist in the care of this patient.   MDM - [facility]   [facility], NP   Please contact Palliative Medicine Team phone at [phone] for questions and concerns.  For individual providers, please see AMION.

## 2022-02-19 NOTE — Plan of Care (Signed)
  Problem: Education: Goal: Knowledge of General Education information will improve Description: Including pain rating scale, medication(s)/side effects and non-pharmacologic comfort measures Outcome: Not Progressing   Problem: Health Behavior/Discharge Planning: Goal: Ability to manage health-related needs will improve Outcome: Not Progressing   Problem: Clinical Measurements: Goal: Ability to maintain clinical measurements within normal limits will improve Outcome: Not Progressing Goal: Will remain free from infection Outcome: Not Progressing   Problem: Nutrition: Goal: Adequate nutrition will be maintained Outcome: Progressing   Problem: Pain Managment: Goal: General experience of comfort will improve Outcome: Progressing   Problem: Safety: Goal: Ability to remain free from injury will improve Outcome: Not Progressing   Problem: Skin Integrity: Goal: Risk for impaired skin integrity will decrease Outcome: Not Progressing

## 2022-02-19 NOTE — Progress Notes (Signed)
PT Cancellation Note  Patient Details Name: Nicole Cunningham MRN: [id number] DOB: [birth year]   Cancelled Treatment:    Reason Eval/Treat Not Completed: Patient declined, no reason specified Pt politely declining, stating, she would like not to be bothered today. Working with OT on hand splints this afternoon.  Lillia Pauls, PT, DPT Acute Rehabilitation Services Office [phone]    Norval Morton 02/19/2022, 4:24 PM

## 2022-02-20 ENCOUNTER — Other Ambulatory Visit (HOSPITAL_COMMUNITY): Payer: Self-pay

## 2022-02-20 DIAGNOSIS — R638 Other symptoms and signs concerning food and fluid intake: Secondary | ICD-10-CM

## 2022-02-20 LAB — CBC
HCT: 25.9 % — ABNORMAL LOW (ref 36.0–46.0)
Hemoglobin: 8.6 g/dL — ABNORMAL LOW (ref 12.0–15.0)
MCH: 27.3 pg (ref 26.0–34.0)
MCHC: 33.2 g/dL (ref 30.0–36.0)
MCV: 82.2 fL (ref 80.0–100.0)
Platelets: 296 10*3/uL (ref 150–400)
RBC: 3.15 MIL/uL — ABNORMAL LOW (ref 3.87–5.11)
RDW: 23.8 % — ABNORMAL HIGH (ref 11.5–15.5)
WBC: 7.3 10*3/uL (ref 4.0–10.5)
nRBC: 0 % (ref 0.0–0.2)

## 2022-02-20 LAB — HEPARIN LEVEL (UNFRACTIONATED): Heparin Unfractionated: 0.37 IU/mL (ref 0.30–0.70)

## 2022-02-20 LAB — BASIC METABOLIC PANEL
Anion gap: 6 (ref 5–15)
BUN: 26 mg/dL — ABNORMAL HIGH (ref 8–23)
CO2: 18 mmol/L — ABNORMAL LOW (ref 22–32)
Calcium: 8.1 mg/dL — ABNORMAL LOW (ref 8.9–10.3)
Chloride: 112 mmol/L — ABNORMAL HIGH (ref 98–111)
Creatinine, Ser: 0.99 mg/dL (ref 0.44–1.00)
GFR, Estimated: >60 mL/min (ref >60)
Glucose, Bld: 108 mg/dL — ABNORMAL HIGH (ref 70–99)
Potassium: 3.9 mmol/L (ref 3.5–5.1)
Sodium: 136 mmol/L (ref 135–145)

## 2022-02-20 LAB — GLUCOSE, CAPILLARY
Glucose-Capillary: 129 mg/dL — ABNORMAL HIGH (ref 70–99)
Glucose-Capillary: 102 mg/dL — ABNORMAL HIGH (ref 70–99)
Glucose-Capillary: 98 mg/dL (ref 70–99)

## 2022-02-20 MED ORDER — ENSURE ENLIVE PO LIQD
237.0000 mL | Freq: Three times a day (TID) | ORAL | Status: DC
  Administered 2022-02-20 – 2022-02-22 (×4): 237 mL via ORAL

## 2022-02-20 NOTE — Progress Notes (Signed)
PROGRESS NOTE    Nicole Cunningham  IRW:[id number] DOB: 23-May-1960 DOA: 01/28/2022 PCP: [name], MD   Brief Narrative:  Patient is a 31 old female with history of COPD, anemia, hypertension who was brought to the emergency department on 11/6 with altered mental status, shortness of breath.  On presentation, lab work showed creatinine of 4.1, lactic acid of 9.  CT head, chest x-ray did not show any acute findings.  CT abdomen/pelvis showed 17 mm nonobstructing renal calculus without hydronephrosis.  Patient was admitted for the management of septic shock under ICU service.  Patient had a prolonged hospital course, found to have E. coli bacteremia, echo showed EF of 20 to 25% for which cardiology consulted.  Patient was managed for cardiogenic/septic shock.  Transferred to [facility] service on 11/18.   Significant events as per PCCM :   11/6 septic shock on levo , ETT >> 11/6 Echo EF 20 to 25% global hypokinesis, apex intact 11/6 RIJ HD cath >> dobutamine at 5 mics 11/7>>Dobutamine increased to 7.5 mics , 1 unit PRBC and 1 unit platelet transfusion 11/8 dobutamine increased to 10 mics 11/10 dobut wean, diuretic, pigtail  11/13 restarted levophed overnight , Short run of SVT, weaned Dobutamine to 3 , repleted K, 1 Unit PRBC per cards ( HGB 7.2)  Assessment & Plan:   Principal Problem:   Septic shock (HCC) Active Problems:   Sepsis (HCC)   Acute encephalopathy   Pressure injury of skin  Shock: Mixed cardiogenic and septic shock.  Suspicion for sepsis/stress cardiomyopathy.   Dobutamine stopped.  Off vasopressors. Repeat echo shows 70% ejection fraction/hyperdynamic left ventricular.  No plans for cardiac cath.   Dysphagia: Patient underwent MBS on 02/14/2022 and was started on dysphagia 1 diet, SLP following, advanced to dysphagia 2 diet.  Patient is very slowly progressing and she remains at risk of requiring alternative means of nutrition such as PEG tube placement but patient is  completely against that as well.   Bacteremia: Blood culture/urine culture showed E. coli.  Patient completed 10 days of Merrem.   AKI: Likely from ATN from shock. She is getting some IV fluids ordered by cardiology, her creatinine has improved.   Pancytopenia/microcytic anemia.: Thought to be sepsis related.  She has thus far received 5 units of PRBC transfusion and 2 units of platelet transfusion during this hospitalization.  Hemoglobin dropped to the range of 6 and she received 1 unit of PRBC transfusion on 02/11/2022.  Hemoglobin once again dropped to 6.7 on 02/17/2022 and 1 unit of PRBC transfusion has been ordered.  Hemoglobin over 8.6 today.  Monitor closely.  Unfortunately, she needs heparin as well due to her severe upper extremity ischemia and dry gangrene.   Acute hypoxic respiratory failure secondary to multifocal pneumonia: History of COPD.  Currently on Brovana, Pulmicort, DuoNeb. currently on 2 L of oxygen via nasal cannula.  Tracheal aspirate has shown Enterobacter cloacae.  Continue current antibiotics. Patient was also found to have left parapneumonic effusion.  Status post pigtail placement, now has been removed.  Extubated on 11/16 with no intention to intubate.   Acute systolic CHF: Heart failure team following.  Echo showed EF of 20 to 25% with severe hypokinesis, normal RV, dilated IVC.  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  She is getting some IV fluids again today.  Cardiology managing.   Septic encephalopathy:   CT head on presentation did  not show any acute findings.  Currently unable to swallow, speech therapy following and recommending n.p.o.  Currently on tube feeding.  Speech therapy should continue to follow and evaluate her swallowing so that the feeding can be discontinued.  Started on Seroquel for agitation by PCCM team.  Might need to be titrated. Mental  status has significantly improved and she is currently alert and oriented for past several days.     Upper extremity ischemia with dry gangrene: Present on admission.  Worse on the right side.  Also suspected to have worsening from pressures causing vaso constriction.Vascular surgery following.  Currently on low-dose heparin.  No plan for intervention.  Rx for autoamputation.  Vascular surgery following.  Per them, her hands are nonsalvageable and patient may need evaluation by hand surgery at some point in time however patient carries very poor prognosis with multiple comorbidities.  I had a very lengthy and frank discussion with the patient while her husband and son were present.   I broached the topic of possibly transitioning to hospice.  Per palliative care note from 02/19/2022, patient and family has now agreed to go home with hospice but over the weekend.  However she may also qualify for residential hospice.  Will defer to palliative care.   Hypokalemia/hypernatremia: Sodium and potassium both normalized, reduced free water boluses frequency from every 4 hours to every 8 hours.   Nonobstructing ureteral stone: We recommend follow-up with urology as an outpatient.  Thrombocytopenia: Likely sepsis driven.  Improved.   Goals of care/deconditioning/debility: Palliative care closely following.  Currently DNR.   DVT prophylaxis: Place and maintain sequential compression device Start: 01/29/22 1021   Code Status: DNR  Family Communication:  None present at bedside.  Status is: Inpatient Remains inpatient appropriate because: Patient very sick.     Estimated body mass index is 21.3 kg/m as calculated from the following:   Height as of this encounter: 5\' 4"  (1.626 m).   Weight as of this encounter: 56.3 kg.  Pressure Injury 02/08/22 Buttocks Right Stage 2 -  Partial thickness loss of dermis presenting as a shallow open injury with a red, pink wound bed without slough. (Active)  02/08/22 0800   Location: Buttocks  Location Orientation: Right  Staging: Stage 2 -  Partial thickness loss of dermis presenting as a shallow open injury with a red, pink wound bed without slough.  Wound Description (Comments):   Present on Admission:   Dressing Type Foam - Lift dressing to assess site every shift 02/19/22 2100   Nutritional Assessment: Body mass index is 21.3 kg/m.2101 Seen by dietician.  I agree with the assessment and plan as outlined below: Nutrition Status: Nutrition Problem: Severe Malnutrition Etiology: chronic illness (COPD) Signs/Symptoms: severe muscle depletion, severe fat depletion Interventions: Tube feeding  . Skin Assessment: I have examined the patient's skin and I agree with the wound assessment as performed by the wound care RN as outlined below: Pressure Injury 02/08/22 Buttocks Right Stage 2 -  Partial thickness loss of dermis presenting as a shallow open injury with a red, pink wound bed without slough. (Active)  02/08/22 0800  Location: Buttocks  Location Orientation: Right  Staging: Stage 2 -  Partial thickness loss of dermis presenting as a shallow open injury with a red, pink wound bed without slough.  Wound Description (Comments):   Present on Admission:   Dressing Type Foam - Lift dressing to assess site every shift 02/19/22 2100    Consultants:  Cardiology Vascular surgery  Palliative care Procedures:  As above  Antimicrobials:  Anti-infectives (From admission, onward)    Start     Dose/Rate Route Frequency Ordered Stop   02/04/22 1345  meropenem (MERREM) 1 g in sodium chloride 0.9 % 100 mL IVPB        1 g 200 mL/hr over 30 Minutes Intravenous Every 12 hours 02/04/22 1258 02/13/22 2350   02/01/22 2200  ceFAZolin (ANCEF) IVPB 2g/100 mL premix  Status:  Discontinued        2 g 200 mL/hr over 30 Minutes Intravenous Every 12 hours 02/01/22 0754 02/04/22 1258   01/29/22 2200  ceFEPIme (MAXIPIME) 1 g in sodium chloride 0.9 % 100 mL IVPB  Status:   Discontinued        1 g 200 mL/hr over 30 Minutes Intravenous Every 24 hours 01/29/22 0046 01/29/22 0242   01/29/22 2200  cefTRIAXone (ROCEPHIN) 2 g in sodium chloride 0.9 % 100 mL IVPB  Status:  Discontinued        2 g 200 mL/hr over 30 Minutes Intravenous Every 24 hours 01/29/22 0242 02/01/22 0754   01/29/22 0048  vancomycin variable dose per unstable renal function (pharmacist dosing)  Status:  Discontinued         Does not apply See admin instructions 01/29/22 0048 01/29/22 1343   01/28/22 2230  ceFEPIme (MAXIPIME) 2 g in sodium chloride 0.9 % 100 mL IVPB        2 g 200 mL/hr over 30 Minutes Intravenous  Once 01/28/22 2228 01/28/22 2350   01/28/22 2230  metroNIDAZOLE (FLAGYL) IVPB 500 mg        500 mg 100 mL/hr over 60 Minutes Intravenous  Once 01/28/22 2228 01/29/22 0021   01/28/22 2230  vancomycin (VANCOCIN) IVPB 1000 mg/200 mL premix        1,000 mg 200 mL/hr over 60 Minutes Intravenous  Once 01/28/22 2228 01/29/22 0150         Subjective:  Patient seen and examined.  No complaints other than bilateral hand pain.  Objective: Vitals:   02/20/22 0425 02/20/22 0429 02/20/22 0722 02/20/22 0837  BP:   132/65   Pulse:   91 93  Resp:   (!) 24 19  Temp:   98.3 F (36.8 C)   TempSrc:   Oral   SpO2:   98% 99%  Weight: 56.3 kg 56.3 kg    Height:        Intake/Output Summary (Last 24 hours) at 02/20/2022 1018 Last data filed at 02/20/2022 3735 Gross per 24 hour  Intake 236 ml  Output 2200 ml  Net -1964 ml    Filed Weights   02/18/22 0431 02/20/22 0425 02/20/22 0429  Weight: 62.8 kg 56.3 kg 56.3 kg    Examination:  General exam: Appears calm and comfortable  Respiratory system: Clear to auscultation. Respiratory effort normal. Cardiovascular system: S1 & S2 heard, RRR. No JVD, murmurs, rubs, gallops or clicks.  +3 pitting edema bilateral lower extremity, +2 pitting edema bilateral upper extremities. Gastrointestinal system: Abdomen is nondistended, soft and  nontender. No organomegaly or masses felt. Normal bowel sounds heard. Central nervous system: Alert and oriented. No focal neurological deficits. Extremities: Symmetric 5 x 5 power.  Gangrenous fingers both hands Skin: No rashes, lesions or ulcers.   Data Reviewed: I have personally reviewed following labs and imaging studies  CBC: Recent Labs  Lab 02/17/22 0330 02/17/22 1400 02/18/22 0230 02/18/22 0517 02/19/22 0535 02/20/22 0330  WBC 7.2 7.4  --  7.1 6.9 7.3  HGB 6.7* 7.0* 9.1* 9.0* 9.2* 8.6*  HCT 21.2* 21.5* 27.7* 27.7* 27.2* 25.9*  MCV 81.5 80.5  --  82.0 81.0 82.2  PLT 255 272  --  270 293 296    Basic Metabolic Panel: Recent Labs  Lab 02/16/22 0400 02/17/22 0330 02/18/22 0517 02/19/22 0535 02/20/22 0330  NA 144 139 139 136 136  K 3.9 4.1 3.9 4.0 3.9  CL 116* 115* 115* 112* 112*  CO2 17* 18* 18* 18* 18*  GLUCOSE 120* 119* 121* 120* 108*  BUN 37* 31* 27* 27* 26*  CREATININE 1.09* 1.03* 1.07* 0.96 0.99  CALCIUM 8.2* 8.0* 8.3* 8.1* 8.1*    GFR: Estimated Creatinine Clearance: 50.2 mL/min (by C-G formula based on SCr of 0.99 mg/dL). Liver Function Tests: No results for input(s): "AST", "ALT", "ALKPHOS", "BILITOT", "PROT", "ALBUMIN" in the last 168 hours.  No results for input(s): "LIPASE", "AMYLASE" in the last 168 hours. No results for input(s): "AMMONIA" in the last 168 hours. Coagulation Profile: No results for input(s): "INR", "PROTIME" in the last 168 hours. Cardiac Enzymes: No results for input(s): "CKTOTAL", "CKMB", "CKMBINDEX", "TROPONINI" in the last 168 hours. BNP (last 3 results) No results for input(s): "PROBNP" in the last 8760 hours. HbA1C: No results for input(s): "HGBA1C" in the last 72 hours. CBG: Recent Labs  Lab 02/19/22 1632 02/19/22 1927 02/19/22 2339 02/20/22 0402 02/20/22 0721  GLUCAP 80 116* 120* 129* 102*    Lipid Profile: No results for input(s): "CHOL", "HDL", "LDLCALC", "TRIG", "CHOLHDL", "LDLDIRECT" in the last 72  hours. Thyroid Function Tests: No results for input(s): "TSH", "T4TOTAL", "FREET4", "T3FREE", "THYROIDAB" in the last 72 hours. Anemia Panel: No results for input(s): "VITAMINB12", "FOLATE", "FERRITIN", "TIBC", "IRON", "RETICCTPCT" in the last 72 hours. Sepsis Labs: No results for input(s): "PROCALCITON", "LATICACIDVEN" in the last 168 hours.  No results found for this or any previous visit (from the past 240 hour(s)).    Radiology Studies: No results found.  Scheduled Meds:  sodium chloride   Intravenous Once   acetaminophen  1,000 mg Oral TID   amLODipine  5 mg Oral Daily   arformoterol  15 mcg Nebulization BID   budesonide (PULMICORT) nebulizer solution  0.25 mg Nebulization BID   carvedilol  3.125 mg Oral BID WC   Chlorhexidine Gluconate Cloth  6 each Topical Q0600   docusate sodium  100 mg Oral BID   feeding supplement  237 mL Oral BID BM   FLUoxetine  20 mg Oral Daily   free water  300 mL Per Tube Q8H   insulin aspart  0-6 Units Subcutaneous Q4H   mouth rinse  15 mL Mouth Rinse 4 times per day   polyethylene glycol  17 g Oral Daily   QUEtiapine  50 mg Oral BID   sodium chloride flush  10-40 mL Intracatheter Q12H   Continuous Infusions:  sodium chloride 10 mL/hr at 02/12/22 0600   sodium chloride Stopped (02/09/22 2311)   feeding supplement (VITAL 1.5 CAL) Stopped (02/20/22 2440)   heparin 950 Units/hr (02/20/22 1014)     LOS: 22 days   Hughie Closs, MD [facility]  02/20/2022, 10:18 AM   *Please note that this is a verbal dictation therefore any spelling or grammatical errors are due to the "Dragon Medical One" system interpretation.  Please page via Amion and do not message via secure chat for urgent patient care matters. Secure chat can be used for non urgent patient care matters.  How to contact  the [facility] Attending or Consulting provider 7A - 7P or covering provider during after hours 7P -7A, for this patient?  Check the care team in [facility] and look for  a) attending/consulting TR[facility] provider listed and b) the [facility] team listed. Page or secure chat 7A-7P. Log into [email or web address] and use [facility] universal password to access. If you do not have the password, please contact the hospital operator. Locate the [facility] provider you are looking for under [facility] and page to a number that you can be directly reached. If you still have difficulty reaching the provider, please page the [facility] (Director on Call) for the Hospitalists listed on amion for assistance.

## 2022-02-20 NOTE — Progress Notes (Signed)
Palliative Medicine Progress Note   Patient Name: Nicole Cunningham       Date: 02/20/2022 DOB: January 27, 1961  Age: 61 y.o. MRN#: [id number] Attending Physician: Hughie Closs, MD Primary Care Physician: [name], MD Admit Date: 01/28/2022    HPI/Patient Profile: 61 y.o. female  with past medical history of COPD, anemia, HTN presents to [facility] ED on 11/6 with AMS. The patient recently admitted to [facility] with AMS on 11/2021, found to have UTI and pyelonephritis during that admission.  On 11/5, patient having increased confusion.  Having some SOB over the past couple of days.  Brought to [facility] ED for further eval. Upon arrival to [facility] ED, patient confused but able to state name.  Admitted with shock, AKI, lactic acidosis of 9.0  She was found to have E. coli bacteremia and EF 20 to 25% on echo, mixed cardiogenic and septic shock. 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 Vascular surgery recommending heparin.    PMT was consulted for GOC conversations.  Subjective: Chart reviewed and patient assessed at bedside. She is resting with her eyes closed, but awakens easily to voice.  She reports that her pain is well-controlled.  We discussed the issue of artificial feeding.  We discussed current recommendations per SLP and concern that her current PO intake is not enough to sustain her long-term.  I asked Wealthy if she wants the feeding tube removed in the setting of pursuing comfort focused care, and she adamantly replies yes.  I later spoke with husband/Larry by phone and summarized the above conversation with Chariah. I explained that artificial feeding is a full scope medical intervention that is not consistent with comfort focused care or hospice philosophy.  Discussed that Radie is tolerating a diet,  but that her PO intake is not enough to sustain her long-term.  Peyton Najjar verbalizes understanding, and is agreeable that feeding tube is removed.  I also discussed with [facility] hospice liaison Danford Bad my concerns regarding patient's social/living situation in the setting of discharging home with hospice.  Family lives in a hotel. Husband works full-time, and their adult son stays home (?disability).  Recommend social work visit to the home soon after discharge.    Objective:  Physical Exam Vitals reviewed.  Constitutional:      General: She is sleeping. She is not in acute  distress.    Appearance: She is ill-appearing.  Pulmonary:     Effort: Pulmonary effort is normal.  Skin:    Comments: Dry gangrene to bilateral fingers/hands with dark discoloration extending to mid forearm   Neurological:     Mental Status: She is oriented to person, place, and time and easily aroused.            LBM: Last BM Date : 02/17/22    Palliative Medicine Assessment & Plan   Assessment: Principal Problem:   Septic shock ([place]) Active Problems:   Sepsis ([place])   Acute encephalopathy   Pressure injury of skin    Recommendations/Plan: Patient has declined amputation Remove cortrak and discontinue nocturnal tube feeds (per patient and husband wishes) Continue oxycodone IR 2.5 mg every 4 hours as needed for pain Plan for discharge home with hospice  PMT will continue to follow   Code Status: DNR/DNI   Prognosis:  < 6 months  Discharge Planning: Home with Hospice  Care plan was discussed with Dr. Doristine Bosworth, [facility], RN, and [facility] hospice liaison  Thank you for allowing the Palliative Medicine Team to assist in the care of this patient.   MDM - High   Lavena Bullion, NP   Please contact Palliative Medicine Team phone at [phone] for questions and concerns.  For individual providers, please see AMION.

## 2022-02-20 NOTE — Plan of Care (Signed)
  Problem: Education: Goal: Knowledge of General Education information will improve Description: Including pain rating scale, medication(s)/side effects and non-pharmacologic comfort measures Outcome: Progressing   Problem: Health Behavior/Discharge Planning: Goal: Ability to manage health-related needs will improve Outcome: Progressing   Problem: Clinical Measurements: Goal: Ability to maintain clinical measurements within normal limits will improve Outcome: Progressing Goal: Will remain free from infection Outcome: Progressing Goal: Diagnostic test results will improve Outcome: Progressing Goal: Respiratory complications will improve Outcome: Progressing Goal: Cardiovascular complication will be avoided Outcome: Progressing   Problem: Activity: Goal: Risk for activity intolerance will decrease Outcome: Progressing   Problem: Nutrition: Goal: Adequate nutrition will be maintained Outcome: Progressing   Problem: Coping: Goal: Level of anxiety will decrease Outcome: Progressing   Problem: Elimination: Goal: Will not experience complications related to bowel motility Outcome: Progressing Goal: Will not experience complications related to urinary retention Outcome: Progressing   Problem: Pain Managment: Goal: General experience of comfort will improve Outcome: Progressing   Problem: Safety: Goal: Ability to remain free from injury will improve Outcome: Progressing   Problem: Skin Integrity: Goal: Risk for impaired skin integrity will decrease Outcome: Progressing   Problem: Activity: Goal: Ability to tolerate increased activity will improve Outcome: Progressing   Problem: Respiratory: Goal: Ability to maintain a clear airway and adequate ventilation will improve Outcome: Progressing   Problem: Role Relationship: Goal: Method of communication will improve Outcome: Progressing   Problem: Activity: Goal: Ability to tolerate increased activity will  improve Outcome: Progressing   Problem: Respiratory: Goal: Ability to maintain a clear airway and adequate ventilation will improve Outcome: Progressing   Problem: Role Relationship: Goal: Method of communication will improve Outcome: Progressing   

## 2022-02-20 NOTE — Progress Notes (Signed)
ANTICOAGULATION CONSULT NOTE - Follow Up Consult  Pharmacy Consult for Heparin Indication: ischemic digits  No Active Allergies  Patient Measurements: Height: 5\' 4"  (162.6 cm) Weight: 56.3 kg (124 lb 1.9 oz) IBW/kg (Calculated) : 54.7 Heparin Dosing Weight: 48 kg  Vital Signs: Temp: 98.3 F (36.8 C) (11/28 0722) Temp Source: Oral (11/28 0722) BP: 132/65 (11/28 0722) Pulse Rate: 93 (11/28 0837)  Labs: Recent Labs    02/18/22 0517 02/18/22 0900 02/18/22 1124 02/19/22 0535 02/20/22 0330  HGB 9.0*  --   --  9.2* 8.6*  HCT 27.7*  --   --  27.2* 25.9*  PLT 270  --   --  293 296  HEPARINUNFRC  --    < > 0.52 0.40 0.37  CREATININE 1.07*  --   --  0.96 0.99   < > = values in this interval not displayed.     Estimated Creatinine Clearance: 50.2 mL/min (by C-G formula based on SCr of 0.99 mg/dL).   Medical History: Past Medical History:  Diagnosis Date   Bronchitis    COPD (chronic obstructive pulmonary disease) (HCC)    Hypertension     Assessment: 61 yo female presented on 11/6 with AMS found to have e coli bacteremia and mixed cardiogenic/septic shock. Course complicated by ischemic fingers and vascular consulted with no plans for procedures. Pharmacy consulted to dose heparin   Heparin level at goal (0.37) on 950 units/hr.   Goal of Therapy:  Heparin level 0.3-0.5 units/ml Monitor platelets by anticoagulation protocol: Yes   Plan:  Continue heparin at 950 units/hr Daily heparin level for now  Thank you for allowing pharmacy to participate in this patient's care.  13/6, PharmD Clinical Pharmacist **Pharmacist phone directory can now be found on [email or web address] ([email or web address]).  Listed under [facility].

## 2022-02-20 NOTE — Progress Notes (Signed)
Brief Note:   Noted pt DNR. Per palliative care note, pt does not want amputation of hands, this decision is "terminal event"  Pt is eating but very little. Pt has intermittent pain in hands which is managed via prn pain meds.   Noted pt would not want PEG tube even if indicated. Agree with removal of Cortrak today given GOC.   Noted plan is for discharge when medically optimized with hospice.   Interventions: 1) Ensure Enlive po TID, each supplement provides 350 kcal and 20 grams of protein. 2) Feeding assistance with meals 3) D/C TF order  Romelle Starcher MS, RDN, LDN, CNSC Registered Dietitian 3 Clinical Nutrition RD Pager and On-Call Pager Number Located in [place]

## 2022-02-20 NOTE — Progress Notes (Signed)
Occupational Therapy Treatment Patient Details Name: Nicole Cunningham MRN: [id number] DOB: 02/03/1961 Today's Date: 02/20/2022   History of present illness 61 y.o. female presents to [facility] hospital on 01/29/2022 with AMS and SOB. Pt admitted with shock, AKI. CT abdomen pelvis 17 mm nonobstructive renal calculus without hydronephrosis. Pt intubated on 11/6. Pt developed acute systolic HF. Bronchoscopy 11/14 for worsening PNA. extubated 11/16. PMH includes COPD and HTN.   OT comments  Mykeisha is making great progress towards self care goals. RUE splint removed at the start of the session, no skin integrity issues noted. Donned wrist support U-cuff and spoon, pt able to self-feed with min A about 25% of meal until she was full. Trialed handled cups with patient, due to minimal active ROM of hand, pt required mod A to be successful and use of bilateral hands on cup. Therapist instruced nsg team on use of u-cuff to allow pt to self feed, pt educated to advocate for use at each meal. OT to continue to follow acutely. POC remains appropriate.    Recommendations for follow up therapy are one component of a multi-disciplinary discharge planning process, led by the attending physician.  Recommendations may be updated based on patient status, additional functional criteria and insurance authorization.    Follow Up Recommendations  Skilled nursing-short term rehab (<3 hours/day)     Assistance Recommended at Discharge Frequent or constant Supervision/Assistance  Patient can return home with the following  Two people to help with walking and/or transfers;Two people to help with bathing/dressing/bathroom;Direct supervision/assist for medications management;Direct supervision/assist for financial management;Assistance with feeding;Assistance with cooking/housework;Assist for transportation;Help with stairs or ramp for entrance   Equipment Recommendations  Other (comment)    Recommendations for Other Services       Precautions / Restrictions Precautions Precautions: Fall;Other (comment) Precaution Comments: necrotic digits in all extremities Restrictions Weight Bearing Restrictions: No       Mobility Bed Mobility                    Transfers                         Balance Overall balance assessment: Needs assistance Sitting-balance support: Feet supported Sitting balance-[name]: Fair                                     ADL either performed or assessed with clinical judgement   ADL Overall ADL's : Needs assistance/impaired Eating/Feeding: Minimal assistance;Bed level Eating/Feeding Details (indicate cue type and reason): supported in bed. Set up wtih wrist support u-cuff and bent spoon. also trialed handled cup options, pt required mod A for cup use. min A for self feeding                                   General ADL Comments: session completed while supported in chair position in bed    Extremity/Trunk Assessment Upper Extremity Assessment Upper Extremity Assessment: RUE deficits/detail;LUE deficits/detail RUE Deficits / Details: thumb- IP contracted @ 90 flexion/no active movemetns; able to range MP and CMC; minimal active movemtn however able to achieve opposition; index - ring -  apparent distal necrosis; ableto range index - ring through funcitonal ROM; little most affectedwiht limited ROM of DIP; tightness noted in all flexors with apparent tenodesis grasp; wrist ROM  limited however overall WFL passively; positions wiht wrist flexed @ 90 with overstretching of extensors. able to facilitate active wrist extension with gravity eliminated; able to achieve funcitonal ROM elbow and shoulder - pain with full elbow extension RUE Sensation: decreased light touch;decreased proprioception RUE Coordination: decreased fine motor;decreased gross motor LUE Deficits / Details: more active movemetn overall with L hand however iincreased  distal necrosis and contractures of IPs; gross grasp/extension overall WFL within limitations of contractures; increased active movemetn L and as gross grasp; able to maintain grasp on cloth when wrist supported; thumb - IP flexed adn contracted - thumb worse on R dominanat hand LUE Sensation: decreased light touch;decreased proprioception LUE Coordination: decreased fine motor   Lower Extremity Assessment Lower Extremity Assessment: Defer to PT evaluation        Vision   Vision Assessment?: No apparent visual deficits   Perception Perception Perception: Not tested   Praxis      Cognition Arousal/Alertness: Awake/alert Behavior During Therapy: WFL for tasks assessed/performed Overall Cognitive Status: Impaired/Different from baseline                               Problem Solving: Slow processing, Decreased initiation, Requires verbal cues General Comments: follows commands, demonstrates great understanding of AE to increase self feeding. Able to recall general splinting schedule set yesterday.        Exercises Exercises: Other exercises Other Exercises Other Exercises: general AAROM of R hand, PROM of LUE    Shoulder Instructions       General Comments OT supplies left in pt's room for ADL use. NT educated on donning wrist support wtih u-cuff for self feeding    Pertinent Vitals/ Pain       Pain Assessment Pain Assessment: Faces Faces Pain Scale: Hurts a little bit Pain Location: BUE with ROM Pain Descriptors / Indicators: Grimacing, Guarding Pain Intervention(s): Limited activity within patient's tolerance, Monitored during session  Home Living                                          Prior Functioning/Environment              Frequency  Min 4X/week        Progress Toward Goals  OT Goals(current goals can now be found in the care plan section)  Progress towards OT goals: Progressing toward goals  Acute Rehab OT  Goals Patient Stated Goal: to feed self OT Goal Formulation: With patient Time For Goal Achievement: 02/28/22 Potential to Achieve Goals: Good ADL Goals Pt Will Perform Eating: with mod assist;with adaptive utensils;bed level;with assist to don/doff brace/orthosis Pt Will Perform Grooming: with mod assist;with adaptive equipment;bed level Pt/caregiver will Perform Home Exercise Program: Increased ROM;Increased strength;Both right and left upper extremity;With minimal assist;With written HEP provided Additional ADL Goal #1: Pt will tolerate unsupported sitting with min G x10 minutes as a precursor to ADLs Additional ADL Goal #2: Pt will indep verbalized splinting schedule and splint management to staff and family Additional ADL Goal #3: Pt will tolerate B hand splints for 2 hour on/2 hours off scheudle to improve funcitonal positoining of Hands Additional ADL Goal #4: Pt will complete hand to mouth pattern with adapted device/orthotic in preparation for self feeding  Plan Discharge plan remains appropriate;Frequency needs to be updated    Co-evaluation  AM-PAC OT "6 Clicks" Daily Activity     Outcome Measure   Help from another person eating meals?: A Little Help from another person taking care of personal grooming?: Total Help from another person toileting, which includes using toliet, bedpan, or urinal?: Total Help from another person bathing (including washing, rinsing, drying)?: Total Help from another person to put on and taking off regular upper body clothing?: Total Help from another person to put on and taking off regular lower body clothing?: Total 6 Click Score: 8    End of Session Equipment Utilized During Treatment: Other (comment) (OT AE supplies)  OT Visit Diagnosis: Unsteadiness on feet (R26.81);Muscle weakness (generalized) (M62.81);Other abnormalities of gait and mobility (R26.89);History of falling (Z91.81);Pain;Adult, failure to thrive (R62.7)    Activity Tolerance Patient tolerated treatment well   Patient Left in bed;with call bell/phone within reach;with family/visitor present   Nurse Communication Other (comment) (demonstrated u-cuff and self feeding)        Time: 1710-1755 OT Time Calculation (min): 45 min  Charges: OT General Charges $OT Visit: 1 Visit OT Treatments $Self Care/Home Management : 23-37 mins $Therapeutic Exercise: 8-22 mins    Donia Pounds 02/20/2022, 6:04 PM

## 2022-02-20 NOTE — Progress Notes (Signed)
Civil engineer, contracting [facility]) Hospital Liaison Note  Referral received for patient/family interest in hospice at home. [facility] liaison spoke with patient's husband to confirm interest. Interest confirmed.   Hospice eligibility pending.   Plan is to discharge home when medically optimized.   DME needs: none  Please send patient home with comfort medications/prescriptions at discharge.   Please call with any questions or concerns. Thank you  Dionicio Stall, [facility] Liaison [phone]

## 2022-02-20 NOTE — TOC Initial Note (Signed)
Transition of Care [facility]) - Initial/Assessment Note    Patient Details  Name: Nicole Cunningham MRN: [id number] Date of Birth: September 16, 1960  [facility]) CM/SW Contact:    Elliot Cousin, RN Phone Number: [phone] 02/20/2022, 12:45 PM  Clinical Narrative:                  HF TOC CM spoke to pt's husband and offered choice for Home Hospice. (Medicare.gov list on shadow chart and pt's room). Husband agreeable to [facility]. Contacted [facility] rep, Sarah with referral. Husband states he will like assistance getting on website and apply for her SS disability. Referral sent to Financial Counselor.    Expected Discharge Plan: Home w Hospice Care Barriers to Discharge: Continued Medical Work up   Patient Goals and CMS Choice Patient states their goals for this hospitalization and ongoing recovery are:: wants to go home with familyh CMS Medicare.gov Compare Post Acute Care list provided to:: Patient Choice offered to / list presented to : Spouse  Expected Discharge Plan and Services Expected Discharge Plan: Home w Hospice Care   Discharge Planning Services: CM Consult Post Acute Care Choice: Hospice Living arrangements for the past 2 months: Hotel/Motel                           HH Arranged: RN HH Agency: [facility] Date [facility] Agency Contacted: 02/20/22 Time HH Agency Contacted: [phone] Representative spoke with at [facility] Agency: Georgia Duff  Prior Living Arrangements/Services Living arrangements for the past 2 months: Hotel/Motel Lives with:: Spouse Patient language and need for interpreter reviewed:: Yes Do you feel safe going back to the place where you live?: Yes      Need for Family Participation in Patient Care: Yes (Comment) Care giver support system in place?: Yes (comment) Current home services: DME ([employer] with seat) Criminal Activity/Legal Involvement Pertinent to Current Situation/Hospitalization: No - Comment  as needed  Activities of Daily Living      Permission Sought/Granted Permission sought to share information with : Case Manager, Family Supports, PCP Permission granted to share information with : Yes, Verbal Permission Granted  Share Information with NAME: Nicole Cunningham     Permission granted to share info w Relationship: husband  Permission granted to share info w Contact Information: [phone]  Emotional Assessment Appearance:: Appears stated age Attitude/Demeanor/Rapport: Other (comment) (unable to speak at this time, nodded head) Affect (typically observed): Accepting Orientation: : Oriented to Self   Psych Involvement: No (comment)  Admission diagnosis:  Septic shock (HCC) [A41.9, R65.21] Sepsis, due to unspecified organism, unspecified whether acute organ dysfunction present [facility]) [A41.9] Patient Active Problem List   Diagnosis Date Noted   Pressure injury of skin 02/09/2022   Septic shock (HCC) 01/29/2022   Acute encephalopathy 01/29/2022   Protein-calorie malnutrition, severe 12/09/2021   Anxiety and depression 12/07/2021   Thrombocytopenia (HCC) 12/07/2021   Unintentional weight loss 12/06/2021   Delirium 12/06/2021   Symptomatic anemia 04/08/2021   Syncope 04/08/2021   MDD (major depressive disorder), recurrent, 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 [place] ([place]) [place] [address] Phone:  [phone] Fax: [phone]  [id number] - [place], [place] - [address] [place] [address] Phone: [phone] Fax: [phone]  [facility] [address] [place] [address] Phone: [phone] Fax: [phone]  [facility] [address] [place] [address] Phone: [phone] Fax: [phone]  [facility] [address] [place] [address] Phone: [phone] Fax: [phone]     Social Determinants of Health (SDOH) Interventions    Readmission Risk Interventions     No data to display

## 2022-02-20 NOTE — TOC CM/SW Note (Signed)
HF TOC CM attempted call to pt's husband to arrange Home Hospice. Left medicare.gov list in room. Pt's son states he is at work. Provided work number # [phone]. Contacted number, unable to leave message. Isidoro Donning RN3 CCM, Heart Failure TOC CM [phone]

## 2022-02-20 NOTE — Progress Notes (Signed)
Physical Therapy Treatment Patient Details Name: Nicole Cunningham MRN: [id number] DOB: 1961-01-21 Today's Date: 02/20/2022   History of Present Illness 61 y.o. female presents to [facility] hospital on 01/29/2022 with AMS and SOB. Pt admitted with shock, AKI. CT abdomen pelvis 17 mm nonobstructive renal calculus without hydronephrosis. Pt intubated on 11/6. Pt developed acute systolic HF. Bronchoscopy 11/14 for worsening PNA. extubated 11/16. PMH includes COPD and HTN.    PT Comments    Pt progressing towards her physical therapy goals, demonstrates decreased edema in all extremities and mild improvement in strength. Pt requiring two person maximal assist for bed mobility. Able to sit unsupported to work on therapeutic exercises for BLE and core strengthening. Will continue to progress as tolerated.     Recommendations for follow up therapy are one component of a multi-disciplinary discharge planning process, led by the attending physician.  Recommendations may be updated based on patient status, additional functional criteria and insurance authorization.  Follow Up Recommendations  Skilled nursing-short term rehab (<3 hours/day) Can patient physically be transported by private vehicle: No   Assistance Recommended at Discharge Frequent or constant Supervision/Assistance  Patient can return home with the following Two people to help with walking and/or transfers;Two people to help with bathing/dressing/bathroom;Assistance with cooking/housework;Assistance with feeding;Direct supervision/assist for medications management;Direct supervision/assist for financial management;Assist for transportation;Help with stairs or ramp for entrance   Equipment Recommendations  Hospital bed;Other (comment);Wheelchair (measurements PT);Wheelchair cushion (measurements PT) (hoyer lift)    Recommendations for Other Services       Precautions / Restrictions Precautions Precautions: Fall;Other (comment) Precaution  Comments: necrotic digits in all extremities Restrictions Weight Bearing Restrictions: No     Mobility  Bed Mobility Overal bed mobility: Needs Assistance Bed Mobility: Supine to Sit, Sit to Supine     Supine to sit: Max assist, +2 for physical assistance Sit to supine: Max assist, +2 for physical assistance   General bed mobility comments: Pt able to initiate progressing BLE's to edge of bed, hooking arm with PT to elevate trunk to upright    Transfers Overall transfer level: Needs assistance Equipment used: None Transfers: Sit to/from Stand Sit to Stand: Max assist, +2 physical assistance           General transfer comment: MaxA + 2 to boost hips up from edge of bed and scoot laterally    Ambulation/Gait                   Stairs             Wheelchair Mobility    Modified Rankin (Stroke Patients Only)       Balance Overall balance assessment: Needs assistance Sitting-balance support: Feet supported Sitting balance-[name]: Fair                                      Cognition Arousal/Alertness: Awake/alert Behavior During Therapy: WFL for tasks assessed/performed Overall Cognitive Status: Impaired/Different from baseline                               Problem Solving: Slow processing, Decreased initiation, Requires verbal cues General Comments: Following commands        Exercises General Exercises - Lower Extremity Heel Slides: Both, 10 reps, Supine Hip ABduction/ADduction: Both, 10 reps, Supine Straight Leg Raises: AAROM, Both, 5 reps, Supine Other Exercises Other Exercises:  Sitting: scapular retractions x 5, sit ups x 5    General Comments        Pertinent Vitals/Pain Pain Assessment Pain Assessment: Faces Faces Pain Scale: Hurts little more Pain Location: RLE with attempts to weight bear Pain Descriptors / Indicators: Grimacing, Guarding Pain Intervention(s): Monitored during session     Home Living                          Prior Function            PT Goals (current goals can now be found in the care plan section) Acute Rehab PT Goals Patient Stated Goal: to improve strength and reduce pain Potential to Achieve Goals: Fair Progress towards PT goals: Progressing toward goals    Frequency    Min 2X/week      PT Plan Current plan remains appropriate    Co-evaluation              AM-PAC PT "6 Clicks" Mobility   Outcome Measure  Help needed turning from your back to your side while in a flat bed without using bedrails?: A Lot Help needed moving from lying on your back to sitting on the side of a flat bed without using bedrails?: Total Help needed moving to and from a bed to a chair (including a wheelchair)?: Total Help needed standing up from a chair using your arms (e.g., wheelchair or bedside chair)?: Total Help needed to walk in hospital room?: Total Help needed climbing 3-5 steps with a railing? : Total 6 Click Score: 7    End of Session   Activity Tolerance: Patient tolerated treatment well Patient left: in bed;with call bell/phone within reach Nurse Communication: Mobility status PT Visit Diagnosis: Other abnormalities of gait and mobility (R26.89);Pain;Muscle weakness (generalized) (M62.81)     Time: 3299-2426 PT Time Calculation (min) (ACUTE ONLY): 21 min  Charges:  $Therapeutic Activity: 8-22 mins                     Lillia Pauls, PT, DPT Acute Rehabilitation Services Office [phone]    Nicole Cunningham 02/20/2022, 4:33 PM

## 2022-02-21 LAB — HEPARIN LEVEL (UNFRACTIONATED): Heparin Unfractionated: 0.55 IU/mL (ref 0.30–0.70)

## 2022-02-21 MED ORDER — QUETIAPINE FUMARATE 50 MG PO TABS
25.0000 mg | ORAL_TABLET | Freq: Every day | ORAL | Status: DC
  Administered 2022-02-22: 25 mg via ORAL
  Filled 2022-02-21: qty 1

## 2022-02-21 MED ORDER — FUROSEMIDE 10 MG/ML IJ SOLN
20.0000 mg | Freq: Two times a day (BID) | INTRAMUSCULAR | Status: AC
  Administered 2022-02-21 (×2): 20 mg via INTRAVENOUS
  Filled 2022-02-21 (×2): qty 2

## 2022-02-21 MED ORDER — APIXABAN 5 MG PO TABS
5.0000 mg | ORAL_TABLET | Freq: Two times a day (BID) | ORAL | Status: DC
  Administered 2022-02-21 – 2022-02-23 (×5): 5 mg via ORAL
  Filled 2022-02-21 (×5): qty 1

## 2022-02-21 MED ORDER — ALBUMIN HUMAN 25 % IV SOLN
25.0000 g | Freq: Four times a day (QID) | INTRAVENOUS | Status: AC
  Administered 2022-02-21 (×2): 25 g via INTRAVENOUS
  Filled 2022-02-21 (×2): qty 100

## 2022-02-21 NOTE — Progress Notes (Signed)
ANTICOAGULATION CONSULT NOTE - Follow Up Consult  Pharmacy Consult for Heparin > apixaban  Indication: ischemic digits  No Active Allergies  Patient Measurements: Height: 5\' 4"  (162.6 cm) Weight: 55 kg (121 lb 4.1 oz) IBW/kg (Calculated) : 54.7 Heparin Dosing Weight: 48 kg  Vital Signs: Temp: 97.6 F (36.4 C) (11/29 0720) Temp Source: Oral (11/29 0720) BP: 127/70 (11/29 0720) Pulse Rate: 88 (11/29 0720)  Labs: Recent Labs    02/19/22 0535 02/20/22 0330 02/21/22 0500  HGB 9.2* 8.6*  --   HCT 27.2* 25.9*  --   PLT 293 296  --   HEPARINUNFRC 0.40 0.37 0.55  CREATININE 0.96 0.99  --      Estimated Creatinine Clearance: 50.2 mL/min (by C-G formula based on SCr of 0.99 mg/dL).   Medical History: Past Medical History:  Diagnosis Date   Bronchitis    COPD (chronic obstructive pulmonary disease) (HCC)    Hypertension     Assessment: 61 yo female presented on 11/6 with AMS found to have e coli bacteremia and mixed cardiogenic/septic shock. Course complicated by ischemic fingers and vascular consulted with no plans for procedures. Pharmacy consulted to dose heparin   Heparin level at goal (0.55) on heparin drip rate 950 units/hr.  Making plans for hospice at DC, no interventions planned, taking other po meds - will change to PO anticoagulation after discussion with MD  Goal of Therapy:  Heparin level 0.3-0.5 units/ml Monitor platelets by anticoagulation protocol: Yes   Plan:  Stop heparin drip  Begin apixaban 5mg  bid  (Wt < 60kg, age < 80yo, Cr < 1.5)      13/6 Pharm.D. CPP, BCPS Clinical Pharmacist [phone] 02/21/2022 10:05 AM   **Pharmacist phone directory can now be found on [email or web address] ([email or web address]).  Listed under [facility].

## 2022-02-21 NOTE — Progress Notes (Addendum)
PROGRESS NOTE    Nicole Cunningham  MGQ:[id number] DOB: 04/08/1960 DOA: 01/28/2022 PCP: [name], MD Brief Narrative:  85 old female with history of COPD, anemia, hypertension who was brought to the emergency department on 11/6 with altered mental status, shortness of breath.  On presentation, lab work showed creatinine of 4.1, lactic acid of 9.  CT head, chest x-ray unremarkable.  CT abdomen/pelvis showed 17 mm nonobstructing renal calculus without hydronephrosis.  Patient was admitted to ICU, Rx for septic shock.  Patient had a prolonged hospital course, found to have E. coli bacteremia, echo showed EF of 20 to 25% for which cardiology consulted.  Patient was managed for cardiogenic/septic shock.  Transferred to [facility] service on 11/18. [facility] course complicated by gangrene/severe ischemic changes to both hands with dry gangrene of all digits, seen by vascular surgery, this is felt to be a complication of septic shock on pressors, no plans for intervention, has been on IV heparin -Goals of care discussions by Dr. Jacqulyn Bath 11/24-11/27, and seen by palliative care this admission, now plan for hospice   Significant events as per PCCM :   11/6 septic shock on levo , ETT >> 11/6 Echo EF 20 to 25% global hypokinesis, apex intact 11/6 RIJ HD cath >> dobutamine at 5 mics 11/7>>Dobutamine increased to 7.5 mics , 1 unit PRBC and 1 unit platelet transfusion 11/8 dobutamine increased to 10 mics 11/10 dobut wean, diuretic, pigtail  11/13 restarted levophed overnight , Short run of SVT, weaned Dobutamine to 3 , repleted K, 1 Unit PRBC per cards ( HGB 7.2)  Assessment & Plan:  Shock: Mixed cardiogenic and septic shock.   -Now off vasopressors, and stable off dobutamine -Repeat echo shows 70% ejection fraction/hyperdynamic left ventricular.  No plans for cardiac cath.  -Blood culture/urine culture showed E. coli.  Patient completed 10 days of Merrem. -BP stable  Acute hypoxic respiratory failure   -secondary to severe sepsis, multifocal pneumonia: History of COPD.  Currently on Brovana, Pulmicort, DuoNeb. currently on 2 L of oxygen via nasal cannula.  Tracheal aspirate has shown Enterobacter cloacae.  Completed Abx -also found to have left parapneumonic effusion.  Status post pigtail placement, since removed.   -Extubated on 11/16    Acute systolic CHF:  Cardiogenic shock  Severe hypoalbuminemia with third spacing -Now resolved, heart failure team was following.  Echo showed EF of 20 to 25% with severe hypokinesis, normal RV, dilated IVC.   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  Rx for autoamputation.  Vascular surgery following.felt hands are nonsalvageable, may need evaluation by hand surgery at some point in time however patient carries very poor prognosis with multiple comorbidities and suggested consideration of hospice.  Dr. Jacqulyn Bath had a long d/w patient and spouse and son this admission, palliative care was consulted, now plan for hospice services at home -Will change heparin to  Eliquis today, no plans for intervention noted   Dysphagia: Patient underwent MBS on 02/14/2022 and was started on dysphagia 1 diet, SLP following, advanced to dysphagia 2 diet.  Patient is very slowly progressing  and she remains at risk of requiring alternative means of nutrition such as PEG tube placement but patient declines this, palliative following, plan for hospice, Cortrak came out yesterday   AKI: Secondary to ATN, shock, resolved   Pancytopenia/microcytic anemia.:  -due to critical illness, s.p 5 units of PRBC transfusion and 2 units of platelet transfusion during this hospitalization.   -Now stable, no plans for further workup  Severe protein calorie malnutrition -Continue supplements as tolerated, now off tube feeds   Hypokalemia/hypernatremia:  -Replaced   Nonobstructing ureteral stone:   Thrombocytopenia: Likely sepsis driven.  Improved.   Goals of care/deconditioning/debility: Palliative care closely following.  Currently DNR.  Plan for home with hospice this week  DVT prophylaxis: IV heparin-> changed to Eliquis   Code Status: DNR  Family Communication:  None present at bedside.  Status is: Inpatient TOC following, working with family to set up hospice services, anticipate home this week   . Skin Assessment: I have examined the patient's skin and I agree with the wound assessment as performed by the wound care RN as outlined below: Pressure Injury 02/08/22 Buttocks Right Stage 2 -  Partial thickness loss of dermis presenting as a shallow open injury with a red, pink wound bed without slough. (Active)  02/08/22 0800  Location: Buttocks  Location Orientation: Right  Staging: Stage 2 -  Partial thickness loss of dermis presenting as a shallow open injury with a red, pink wound bed without slough.  Wound Description (Comments):   Present on Admission:   Dressing Type Foam - Lift dressing to assess site every shift 02/20/22 2028    Consultants:  Cardiology Vascular surgery Palliative care Procedures:  As above  Antimicrobials:  Anti-infectives (From admission, onward)    Start     Dose/Rate Route Frequency Ordered Stop   02/04/22 1345  meropenem (MERREM) 1 g in  sodium chloride 0.9 % 100 mL IVPB        1 g 200 mL/hr over 30 Minutes Intravenous Every 12 hours 02/04/22 1258 02/13/22 2350   02/01/22 2200  ceFAZolin (ANCEF) IVPB 2g/100 mL premix  Status:  Discontinued        2 g 200 mL/hr over 30 Minutes Intravenous Every 12 hours 02/01/22 0754 02/04/22 1258   01/29/22 2200  ceFEPIme (MAXIPIME) 1 g in sodium chloride 0.9 % 100 mL IVPB  Status:  Discontinued        1 g 200 mL/hr over 30 Minutes Intravenous Every 24 hours 01/29/22 0046 01/29/22 0242   01/29/22 2200  cefTRIAXone (ROCEPHIN) 2 g in sodium chloride 0.9 % 100 mL IVPB  Status:  Discontinued        2 g 200 mL/hr over 30 Minutes Intravenous Every 24 hours 01/29/22 0242 02/01/22 0754   01/29/22 0048  vancomycin variable dose per unstable renal function (pharmacist dosing)  Status:  Discontinued         Does not apply See admin instructions 01/29/22 0048 01/29/22 1343   01/28/22 2230  ceFEPIme (MAXIPIME) 2 g in sodium chloride 0.9 % 100 mL IVPB        2 g 200 mL/hr over 30 Minutes Intravenous  Once 01/28/22 2228 01/28/22 2350   01/28/22 2230  metroNIDAZOLE (FLAGYL) IVPB 500 mg  500 mg 100 mL/hr over 60 Minutes Intravenous  Once 01/28/22 2228 01/29/22 0021   01/28/22 2230  vancomycin (VANCOCIN) IVPB 1000 mg/200 mL premix        1,000 mg 200 mL/hr over 60 Minutes Intravenous  Once 01/28/22 2228 01/29/22 0150         Subjective:  -Some pain in her digits, swollen all over  Objective: Vitals:   02/20/22 2100 02/20/22 2321 02/21/22 0230 02/21/22 0234  BP:  (!) 111/52  100/63  Pulse:  83 86 88  Resp: 20 14 16 16   Temp:  98.3 F (36.8 C)  98.4 F (36.9 C)  TempSrc:  Oral  Oral  SpO2: 100%  100% 100%  Weight:   55 kg   Height:        Intake/Output Summary (Last 24 hours) at 02/21/2022 0557 Last data filed at 02/20/2022 1354 Gross per 24 hour  Intake 236 ml  Output 1050 ml  Net -814 ml   Filed Weights   02/20/22 0425 02/20/22 0429 02/21/22 0230  Weight: 56.3 kg 56.3 kg  55 kg    Examination:  General exam: Chronically ill female appears much older than stated age, awake alert oriented x 2, mild cognitive deficits HEENT: Positive JVD CVS: S1-S2, regular rhythm Lungs: Decreased breath sounds the bases Abdomen: Soft, nontender, bowel sounds present Extremities:  2-3+ edema  Gangrenous fingers both hands Skin: As above  Data Reviewed: I have personally reviewed following labs and imaging studies  CBC: Recent Labs  Lab 02/17/22 0330 02/17/22 1400 02/18/22 0230 02/18/22 0517 02/19/22 0535 02/20/22 0330  WBC 7.2 7.4  --  7.1 6.9 7.3  HGB 6.7* 7.0* 9.1* 9.0* 9.2* 8.6*  HCT 21.2* 21.5* 27.7* 27.7* 27.2* 25.9*  MCV 81.5 80.5  --  82.0 81.0 82.2  PLT 255 272  --  270 293 0000000   Basic Metabolic Panel: Recent Labs  Lab 02/16/22 0400 02/17/22 0330 02/18/22 0517 02/19/22 0535 02/20/22 0330  NA 144 139 139 136 136  K 3.9 4.1 3.9 4.0 3.9  CL 116* 115* 115* 112* 112*  CO2 17* 18* 18* 18* 18*  GLUCOSE 120* 119* 121* 120* 108*  BUN 37* 31* 27* 27* 26*  CREATININE 1.09* 1.03* 1.07* 0.96 0.99  CALCIUM 8.2* 8.0* 8.3* 8.1* 8.1*   GFR: Estimated Creatinine Clearance: 50.2 mL/min (by C-G formula based on SCr of 0.99 mg/dL). Liver Function Tests: No results for input(s): "AST", "ALT", "ALKPHOS", "BILITOT", "PROT", "ALBUMIN" in the last 168 hours.  No results for input(s): "LIPASE", "AMYLASE" in the last 168 hours. No results for input(s): "AMMONIA" in the last 168 hours. Coagulation Profile: No results for input(s): "INR", "PROTIME" in the last 168 hours. Cardiac Enzymes: No results for input(s): "CKTOTAL", "CKMB", "CKMBINDEX", "TROPONINI" in the last 168 hours. BNP (last 3 results) No results for input(s): "PROBNP" in the last 8760 hours. HbA1C: No results for input(s): "HGBA1C" in the last 72 hours. CBG: Recent Labs  Lab 02/19/22 1927 02/19/22 2339 02/20/22 0402 02/20/22 0721 02/20/22 1147  GLUCAP 116* 120* 129* 102* 98   Lipid  Profile: No results for input(s): "CHOL", "HDL", "LDLCALC", "TRIG", "CHOLHDL", "LDLDIRECT" in the last 72 hours. Thyroid Function Tests: No results for input(s): "TSH", "T4TOTAL", "FREET4", "T3FREE", "THYROIDAB" in the last 72 hours. Anemia Panel: No results for input(s): "VITAMINB12", "FOLATE", "FERRITIN", "TIBC", "IRON", "RETICCTPCT" in the last 72 hours. Sepsis Labs: No results for input(s): "PROCALCITON", "LATICACIDVEN" in the last 168 hours.  No results found for this or  any previous visit (from the past 240 hour(s)).    Radiology Studies: No results found.  Scheduled Meds:  sodium chloride   Intravenous Once   acetaminophen  1,000 mg Oral TID   amLODipine  5 mg Oral Daily   arformoterol  15 mcg Nebulization BID   budesonide (PULMICORT) nebulizer solution  0.25 mg Nebulization BID   carvedilol  3.125 mg Oral BID WC   Chlorhexidine Gluconate Cloth  6 each Topical Q0600   docusate sodium  100 mg Oral BID   feeding supplement  237 mL Oral TID BM   FLUoxetine  20 mg Oral Daily   mouth rinse  15 mL Mouth Rinse 4 times per day   polyethylene glycol  17 g Oral Daily   QUEtiapine  50 mg Oral BID   sodium chloride flush  10-40 mL Intracatheter Q12H   Continuous Infusions:  sodium chloride 10 mL/hr at 02/12/22 0600   sodium chloride Stopped (02/09/22 2311)   heparin 950 Units/hr (02/20/22 1014)     LOS: 23 days   Domenic Polite, MD [facility]  02/21/2022, 5:57 AM

## 2022-02-21 NOTE — Progress Notes (Signed)
Occupational Therapy Treatment Patient Details Name: Nicole Cunningham MRN: [id number] DOB: Jul 26, 1960 Today's Date: 02/21/2022   History of present illness 61 y.o. female [facility] hospital on 01/29/2022 with AMS and SOB. Pt admitted with shock, AKI. CT abdomen pelvis 17 mm nonobstructive renal calculus without hydronephrosis. Pt intubated on 11/6. Pt developed acute systolic HF. Bronchoscopy 11/14 for worsening PNA. extubated 11/16. PMH includes COPD and HTN.   OT comments  Pt seen for BUE exercises, splint check, and AE use. Pt tolerated the below BUE exercises with notable improvement in proximal UE ROM and strength. Pt also demonstrated continued use of wrist support U-cuff to self feed. Session targeted to pt's personal goal of being able to access the channel buttons on her call bell. Therapist adapted call bell with tape and applied red built up tubing to splint with hard plastic attached. Pt demonstrated great ability to change channel with set up A. Educated pt that the tubing can also hold markers/pencil to allow her to wrist and draw. OT to continue to follow acutely. POC remains appropriate.    Recommendations for follow up therapy are one component of a multi-disciplinary discharge planning process, led by the attending physician.  Recommendations may be updated based on patient status, additional functional criteria and insurance authorization.    Follow Up Recommendations  Skilled nursing-short term rehab (<3 hours/day)     Assistance Recommended at Discharge Frequent or constant Supervision/Assistance  Patient can return home with the following  Two people to help with walking and/or transfers;Two people to help with bathing/dressing/bathroom;Direct supervision/assist for medications management;Direct supervision/assist for financial management;Assistance with feeding;Assistance with cooking/housework;Assist for transportation;Help with stairs or ramp for entrance   Equipment  Recommendations  Other (comment)    Recommendations for Other Services      Precautions / Restrictions Precautions Precautions: Fall;Other (comment) Precaution Comments: necrotic digits in all extremities Restrictions Weight Bearing Restrictions: No       Mobility Bed Mobility                    Transfers                         Balance                                           ADL either performed or assessed with clinical judgement   ADL Overall ADL's : Needs assistance/impaired Eating/Feeding: Minimal assistance;Bed level Eating/Feeding Details (indicate cue type and reason): with u-cuff splint donned                                 Functional mobility during ADLs: Total assistance;+2 for physical assistance;+2 for safety/equipment General ADL Comments: supported in bed working on [employer] and pt's personal goal of controling the remote    Extremity/Trunk Assessment Upper Extremity Assessment Upper Extremity Assessment: RUE deficits/detail;LUE deficits/detail RUE Deficits / Details: thumb- IP contracted @ 90 flexion/no active movemetns; able to range MP and CMC; minimal active movemtn however able to achieve opposition; index - ring -  apparent distal necrosis; ableto range index - ring through funcitonal ROM; little most affectedwiht limited ROM of DIP; tightness noted in all flexors with apparent tenodesis grasp; wrist ROM limited however overall WFL passively; positions wiht wrist flexed @ 90  with overstretching of extensors. able to facilitate active wrist extension with gravity eliminated; able to achieve funcitonal ROM elbow and shoulder - pain with full elbow extension RUE Sensation: decreased light touch;decreased proprioception RUE Coordination: decreased fine motor;decreased gross motor LUE Deficits / Details: more active movemetn overall with L hand however iincreased distal necrosis and contractures of IPs; gross  grasp/extension overall WFL within limitations of contractures; increased active movemetn L and as gross grasp; able to maintain grasp on cloth when wrist supported; thumb - IP flexed adn contracted - thumb worse on R dominanat hand LUE Sensation: decreased light touch;decreased proprioception LUE Coordination: decreased fine motor   Lower Extremity Assessment Lower Extremity Assessment: Defer to PT evaluation        Vision   Vision Assessment?: No apparent visual deficits   Perception Perception Perception: Not tested   Praxis Praxis Praxis: Not tested    Cognition Arousal/Alertness: Awake/alert Behavior During Therapy: WFL for tasks assessed/performed Overall Cognitive Status: Impaired/Different from baseline Area of Impairment: Attention, Safety/judgement, Awareness, Problem solving, Following commands                   Current Attention Level: Selective   Following Commands: Follows one step commands consistently Safety/Judgement: Decreased awareness of deficits Awareness: Emergent Problem Solving: Slow processing, Decreased initiation, Requires verbal cues General Comments: slow processing, difficulty with recalling how to don feeding splint        Exercises General Exercises - Upper Extremity Shoulder Flexion: AROM, Strengthening, Right, 15 reps, Supine, Both Elbow Flexion: AROM, Strengthening, Right, 15 reps, Supine, Both Hand Exercises Forearm Supination: AAROM, Both, 5 reps, Supine Forearm Pronation: AAROM, Both, 5 reps, Supine Wrist Extension: AAROM, Both, 10 reps, Supine Other Exercises Other Exercises: Utilized foam, plastic and call bell to change channel    Shoulder Instructions       General Comments modified call bell to allow pt to have access to the channel button (personal goal)    Pertinent Vitals/ Pain       Pain Assessment Pain Assessment: Faces Faces Pain Scale: Hurts little more Pain Location: bilateral forearms Pain Descriptors  / Indicators: Grimacing, Guarding Pain Intervention(s): Limited activity within patient's tolerance, Monitored during session  Home Living                                          Prior Functioning/Environment              Frequency  Min 4X/week        Progress Toward Goals  OT Goals(current goals can now be found in the care plan section)  Progress towards OT goals: Progressing toward goals  Acute Rehab OT Goals Patient Stated Goal: to use TV remote OT Goal Formulation: With patient Time For Goal Achievement: 02/28/22 Potential to Achieve Goals: Good ADL Goals Pt Will Perform Eating: with mod assist;with adaptive utensils;bed level;with assist to don/doff brace/orthosis Pt Will Perform Grooming: with mod assist;with adaptive equipment;bed level Pt/caregiver will Perform Home Exercise Program: Increased ROM;Increased strength;Both right and left upper extremity;With minimal assist;With written HEP provided Additional ADL Goal #1: Pt will tolerate unsupported sitting with min G x10 minutes as a precursor to ADLs Additional ADL Goal #2: Pt will indep verbalized splinting schedule and splint management to staff and family Additional ADL Goal #3: Pt will tolerate B hand splints for 2 hour on/2 hours off scheudle to improve funcitonal  positoining of Hands Additional ADL Goal #4: Pt will complete hand to mouth pattern with adapted device/orthotic in preparation for self feeding  Plan Discharge plan remains appropriate;Frequency needs to be updated    Co-evaluation                 AM-PAC OT "6 Clicks" Daily Activity     Outcome Measure   Help from another person eating meals?: A Little Help from another person taking care of personal grooming?: Total Help from another person toileting, which includes using toliet, bedpan, or urinal?: Total Help from another person bathing (including washing, rinsing, drying)?: Total Help from another person to put on  and taking off regular upper body clothing?: Total Help from another person to put on and taking off regular lower body clothing?: Total 6 Click Score: 8    End of Session Equipment Utilized During Treatment: Other (comment) (OT AE supplies)  OT Visit Diagnosis: Unsteadiness on feet (R26.81);Muscle weakness (generalized) (M62.81);Other abnormalities of gait and mobility (R26.89);History of falling (Z91.81);Pain;Adult, failure to thrive (R62.7)   Activity Tolerance Patient tolerated treatment well   Patient Left in bed;with call bell/phone within reach;with family/visitor present   Nurse Communication Other (comment)        Time: 6010-9323 OT Time Calculation (min): 55 min  Charges: OT General Charges $OT Visit: 1 Visit OT Treatments $Therapeutic Activity: 23-37 mins $Therapeutic Exercise: 23-37 mins    Donia Pounds 02/21/2022, 5:42 PM

## 2022-02-21 NOTE — Plan of Care (Signed)
°  Problem: Education: °Goal: Knowledge of General Education information will improve °Description: Including pain rating scale, medication(s)/side effects and non-pharmacologic comfort measures °Outcome: Progressing °  °Problem: Health Behavior/Discharge Planning: °Goal: Ability to manage health-related needs will improve °Outcome: Progressing °  °Problem: Nutrition: °Goal: Adequate nutrition will be maintained °Outcome: Progressing °  °Problem: Skin Integrity: °Goal: Risk for impaired skin integrity will decrease °Outcome: Progressing °  °

## 2022-02-22 LAB — CBC
HCT: 26.3 % — ABNORMAL LOW (ref 36.0–46.0)
Hemoglobin: 8.7 g/dL — ABNORMAL LOW (ref 12.0–15.0)
MCH: 27.2 pg (ref 26.0–34.0)
MCHC: 33.1 g/dL (ref 30.0–36.0)
MCV: 82.2 fL (ref 80.0–100.0)
Platelets: 289 10*3/uL (ref 150–400)
RBC: 3.2 MIL/uL — ABNORMAL LOW (ref 3.87–5.11)
RDW: 22.5 % — ABNORMAL HIGH (ref 11.5–15.5)
WBC: 6.1 10*3/uL (ref 4.0–10.5)
nRBC: 0 % (ref 0.0–0.2)

## 2022-02-22 LAB — BASIC METABOLIC PANEL
Anion gap: 11 (ref 5–15)
BUN: 22 mg/dL (ref 8–23)
CO2: 21 mmol/L — ABNORMAL LOW (ref 22–32)
Calcium: 9.8 mg/dL (ref 8.9–10.3)
Chloride: 109 mmol/L (ref 98–111)
Creatinine, Ser: 1.05 mg/dL — ABNORMAL HIGH (ref 0.44–1.00)
GFR, Estimated: 60 mL/min — ABNORMAL LOW (ref >60)
Glucose, Bld: 85 mg/dL (ref 70–99)
Potassium: 3.5 mmol/L (ref 3.5–5.1)
Sodium: 141 mmol/L (ref 135–145)

## 2022-02-22 NOTE — Progress Notes (Signed)
Mobility Specialist Progress Note    02/22/22 1347  Mobility  Activity Refused mobility   Pt stated she is okay for now. Will f/u as appropriate.   [place] Mobility Specialist  Please Neurosurgeon or Rehab Office at [phone]

## 2022-02-22 NOTE — Plan of Care (Signed)

## 2022-02-22 NOTE — TOC Initial Note (Signed)
Transition of Care [facility]) - Initial/Assessment Note    Patient Details  Name: Nicole Cunningham MRN: [id number] Date of Birth: 08-07-1960  Transition of Care [facility]) CM/SW Contact:    Elliot Cousin, RN Phone Number: [phone] 02/22/2022, 11:46 AM  Clinical Narrative:                 HF TOC CM spoke to pt and husband at bedside. Husband will be able to take pt home tomorrow with Home Hospice. Home Hospice arranged with [facility]. Will need PTAR transportation home. Husband states he is working on her Tree surgeon. He has started the application. Encouraged him to complete as soon as she can to maximize her benefits.   Expected Discharge Plan: Home w Hospice Care Barriers to Discharge: No Barriers Identified   Patient Goals and CMS Choice Patient states their goals for this hospitalization and ongoing recovery are:: wants to go home with familyh CMS Medicare.gov Compare Post Acute Care list provided to:: Patient Represenative (must comment) (Husband) Choice offered to / list presented to : Spouse  Expected Discharge Plan and Services Expected Discharge Plan: Home w Hospice Care In-house Referral: Clinical Social Work Discharge Planning Services: CM Consult Post Acute Care Choice: Hospice Living arrangements for the past 2 months: Hotel/Motel                           HH Arranged: RN HH [facility]: [facility] Date [facility] Agency Contacted: 02/20/22 Time HH Agency Contacted: [phone] Representative spoke with at [facility] Agency: Georgia Duff  Prior Living Arrangements/Services Living arrangements for the past 2 months: Hotel/Motel Lives with:: Spouse Patient language and need for interpreter reviewed:: Yes Do you feel safe going back to the place where you live?: Yes      Need for Family Participation in Patient Care: Yes (Comment) Care giver support system in place?: Yes (comment) Current home services: DME (Rolling Walker with seat) Criminal  Activity/Legal Involvement Pertinent to Current Situation/Hospitalization: No - Comment as needed  Activities of Daily Living      Permission Sought/Granted Permission sought to share information with : Case Manager, Family Supports, PCP Permission granted to share information with : Yes, Verbal Permission Granted  Share Information with NAME: Meris Reede     Permission granted to share info w Relationship: husband  Permission granted to share info w Contact Information: [phone]  Emotional Assessment Appearance:: Appears stated age Attitude/Demeanor/Rapport: Other (comment) (unable to speak at this time, nodded head) Affect (typically observed): Accepting Orientation: : Oriented to Self   Psych Involvement: No (comment)  Admission diagnosis:  Septic shock (HCC) [A41.9, R65.21] Sepsis, due to unspecified organism, unspecified whether acute organ dysfunction present [facility]) [A41.9] Patient Active Problem List   Diagnosis Date Noted   Pressure injury of skin 02/09/2022   Septic shock (HCC) 01/29/2022   Acute encephalopathy 01/29/2022   Protein-calorie malnutrition, severe 12/09/2021   Anxiety and depression 12/07/2021   Thrombocytopenia (HCC) 12/07/2021   Unintentional weight loss 12/06/2021   Delirium 12/06/2021   Symptomatic anemia 04/08/2021   Syncope 04/08/2021   MDD (major depressive disorder), recurrent, 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   Essential hypertension 04/25/2020   History of COVID-19 04/25/2020   History of sepsis 04/25/2020   Anemia 04/25/2020   Hypomagnesemia 04/25/2020   COVID-19 virus infection 04/20/2020   AKI (acute kidney injury) (HCC) 04/18/2020   Sepsis (HCC) 04/18/2020   Hypertensive urgency 12/15/2019  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   MDD (major depressive disorder), recurrent, in full remission (HCC) 12/14/2019   Anxiety disorder 12/14/2019   PCP:  [name], MD Pharmacy:   [facility] [address] ([address]),  [place] - [address] [place] ([place]) [place] [address] Phone: [phone] Fax: [phone]  [id number] - [place], [place] - [address] [place] [address] Phone: [phone] Fax: [phone]  [facility] [address] [place] [address] Phone: [phone] Fax: [phone]  [facility] [address] [place] [address] Phone: [phone] Fax: [phone]  [facility] [address] [place] [address] Phone: [phone] Fax: [phone]     Social Determinants of Health (SDOH) Interventions    Readmission Risk Interventions     No data to display

## 2022-02-22 NOTE — Plan of Care (Signed)
  Problem: Education: Goal: Knowledge of General Education information will improve Description: Including pain rating scale, medication(s)/side effects and non-pharmacologic comfort measures Outcome: Progressing   Problem: Clinical Measurements: Goal: Will remain free from infection Outcome: Progressing Goal: Respiratory complications will improve Outcome: Progressing   Problem: Coping: Goal: Level of anxiety will decrease Outcome: Progressing   

## 2022-02-22 NOTE — Progress Notes (Signed)
PROGRESS NOTE  Nicole Cunningham WPY:[id number] DOB: Dec 05, 1960 DOA: 01/28/2022 PCP: [name], MD   LOS: 24 days   Brief Narrative / Interim history: 61 year old female with history of COPD, anemia, HTN was brought to the hospital on 11/5 with altered mental status, dyspnea.  She was found to have shock and required vasopressors, and was admitted to the ICU.  Shock was believed to be multifactorial cardiogenic and also septic due to E. coli bacteremia.  EF was 20 to 25%, and cardiology was consulted.  She eventually improved and was transferred to the [facility] service on 11/18.  Hospital course was complicated by gangrene / severe ischemic changes to both hands/digits, seen by vascular surgery and felt to be a complication of septic shock.  There are no plans for interventions.  After goals of care discussions between prior hospitalist, palliative and patient's family she is now DNR with plans for home with hospice.  Subjective / 24h Interval events: She is comfortable this morning.  She denies any chest pain, denies any shortness of breath.  No pain in her hands  Assesement and Plan: Principal Problem:   Septic shock (HCC) Active Problems:   Sepsis (HCC)   Acute encephalopathy   Pressure injury of skin  Principal problem Shock-mixed cardiogenic and septic in the setting of E. coli bacteremia.  Initial 2D echo on 11/6 showed an EF of 20-25%, global hypokinesis and cardiology was consulted and followed patient while hospitalized.  This was likely in the setting of gram-negative bacteremia.  Repeat echo on 11/20 showed recovered EF to 70-75%, grade 1 diastolic dysfunction, RV was normal.  She was on pressors and dobutamine, and now they have been weaned off and vitals have remained stable  Active problems E. coli bacteremia-with septic shock.  She is status post 10 days of meropenem.   Acute hypoxic respiratory failure, underlying COPDsecondary to severe sepsis, multifocal pneumonia.   Respiratory status improved, she is currently on room air.  Acute systolic CHF-with cardiogenic shock.  Shock physiology has resolved and EF has normalized.  This was likely in the setting of gram-negative bacteremia  Septic encephalopathy-Resolved.  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 Bilateral upper extremity arterial duplex did not show a thrombus.  She was initially placed on IV heparin, but eventually transition to Eliquis.  Vascular surgery evaluated patient, felt that the hands are nonsalvageable   Dysphagia-Patient underwent MBS on 02/14/2022 and was started on dysphagia 1 diet, SLP following, advanced to dysphagia 2 diet.  Slow progression   AKI-secondary to ATN, shock, resolved   Pancytopenia/microcytic anemia-due to critical illness, s.p 5 units of PRBC transfusion and 2 units of platelet transfusion during this hospitalization.  Hemoglobin and platelets are now stable   Severe protein calorie malnutrition -Continue supplements as tolerated, now off tube feeds   Hypokalemia/hypernatremia -Replaced   Nonobstructing ureteral stone -clinically silent now   Thrombocytopenia -resolved   Goals of care/deconditioning/debility - Palliative care closely following.  Currently DNR.  Plan for home with hospice this week once hospice services have been arranged   Scheduled Meds:  sodium chloride   Intravenous Once   acetaminophen  1,000 mg Oral TID   amLODipine  5 mg Oral Daily   apixaban  5 mg Oral BID   arformoterol  15 mcg Nebulization BID   budesonide (PULMICORT) nebulizer solution  0.25 mg Nebulization BID   carvedilol  3.125 mg Oral BID WC   Chlorhexidine Gluconate Cloth  6 each Topical Q0600   docusate sodium  100 mg Oral BID   feeding supplement  237 mL Oral TID BM   FLUoxetine  20 mg Oral Daily   mouth rinse  15  mL Mouth Rinse 4 times per day   polyethylene glycol  17 g Oral Daily   QUEtiapine  25 mg Oral QHS   sodium chloride flush  10-40 mL Intracatheter Q12H   Continuous Infusions:  sodium chloride 10 mL/hr at 02/12/22 0600   sodium chloride Stopped (02/09/22 2311)   PRN Meds:.sodium chloride, ipratropium-albuterol, lip balm, loperamide, ondansetron (ZOFRAN) IV, mouth rinse, mouth rinse, oxyCODONE, phenol, sodium chloride flush  Current Outpatient Medications  Medication Instructions   albuterol (VENTOLIN HFA) 108 (90 Base) MCG/ACT inhaler 1-2 puffs, Inhalation, Every 6 hours PRN   ferrous sulfate 325 mg, Oral, 2 times daily with meals   FLUoxetine (PROZAC) 80 mg, Oral, Daily   fluticasone furoate-vilanterol (BREO ELLIPTA) 200-25 MCG/ACT AEPB inhale 1 puff into the lungs daily   gabapentin (NEURONTIN) 400 mg, Oral, Daily at bedtime   losartan-hydrochlorothiazide (HYZAAR) 100-25 MG tablet 1 tablet, Oral, Daily   mirtazapine (REMERON) 30 mg, Oral, Daily at bedtime   potassium chloride SA (KLOR-CON M) 20 MEQ tablet 40 mEq, Oral, Daily   QUEtiapine (SEROQUEL) 300 mg, Oral, Daily at bedtime    Diet Orders (From admission, onward)     Start     Ordered   02/16/22 0935  DIET DYS 2 Room service appropriate? Yes with Assist; Fluid consistency: Thin  Diet effective now       Question Answer Comment  Room service appropriate? Yes with Assist   Fluid consistency: Thin      02/16/22 0935            DVT prophylaxis: Place and maintain sequential compression device Start: 01/29/22 1021 apixaban (ELIQUIS) tablet 5 mg   Lab Results  Component Value Date   PLT 289 02/22/2022      Code Status: DNR  Family Communication: no family at bedside   Status is: Inpatient  Remains inpatient appropriate because: home with hospice once services set up  Level of care: Progressive  Consultants:  Cardiology PCCM Palliative care Vascular surgery   Objective: Vitals:   02/22/22 0500  02/22/22 0735 02/22/22 0742 02/22/22 0759  BP:  (!) 144/32  (!) 144/66  Pulse:  90  87  Resp:   18 16  Temp:    97.7 F (36.5 C)  TempSrc:    Oral  SpO2:    100%  Weight: 53.7 kg     Height:        Intake/Output Summary (Last 24 hours) at 02/22/2022 0950 Last data filed at 02/22/2022 0905 Gross per 24 hour  Intake 530 ml  Output 3750 ml  Net -3220 ml   Wt Readings from Last 3 Encounters:  02/22/22 53.7 kg  12/09/21 41.2 kg  11/07/21 40.1 kg    Examination:  Constitutional: NAD Eyes: no scleral icterus ENMT: Mucous membranes are moist.  Neck: normal, supple Respiratory: clear to auscultation bilaterally, no wheezing, no crackles. Normal respiratory effort. Cardiovascular: Regular rate and rhythm, no murmurs / rubs / gallops. No edema.  Abdomen: non distended, no tenderness. Bowel sounds positive.  Musculoskeletal: no clubbing /bilateral hands and fingers show cyanosis.   Data Reviewed: I have independently reviewed following labs and imaging studies  CBC Recent Labs  Lab 02/17/22 1400 02/18/22 0230 02/18/22 0517 02/19/22 0535 02/20/22 0330 02/22/22 0509  WBC 7.4  --  7.1 6.9 7.3 6.1  HGB 7.0* 9.1* 9.0* 9.2* 8.6* 8.7*  HCT 21.5* 27.7* 27.7* 27.2* 25.9* 26.3*  PLT 272  --  270 293 296 289  MCV 80.5  --  82.0 81.0 82.2 82.2  MCH 26.2  --  26.6 27.4 27.3 27.2  MCHC 32.6  --  32.5 33.8 33.2 33.1  RDW 26.8*  --  24.0* 24.0* 23.8* 22.5*    Recent Labs  Lab 02/17/22 0330 02/18/22 0517 02/19/22 0535 02/20/22 0330 02/22/22 0509  NA 139 139 136 136 141  K 4.1 3.9 4.0 3.9 3.5  CL 115* 115* 112* 112* 109  CO2 18* 18* 18* 18* 21*  GLUCOSE 119* 121* 120* 108* 85  BUN 31* 27* 27* 26* 22  CREATININE 1.03* 1.07* 0.96 0.99 1.05*  CALCIUM 8.0* 8.3* 8.1* 8.1* 9.8    ------------------------------------------------------------------------------------------------------------------ No results for input(s): "CHOL", "HDL", "LDLCALC", "TRIG", "CHOLHDL", "LDLDIRECT" in  the last 72 hours.  Lab Results  Component Value Date   HGBA1C 5.4 02/02/2022   ------------------------------------------------------------------------------------------------------------------ No results for input(s): "TSH", "T4TOTAL", "T3FREE", "THYROIDAB" in the last 72 hours.  Invalid input(s): "FREET3"  Cardiac Enzymes No results for input(s): "CKMB", "TROPONINI", "MYOGLOBIN" in the last 168 hours.  Invalid input(s): "CK" ------------------------------------------------------------------------------------------------------------------ No results found for: "BNP"  CBG: Recent Labs  Lab 02/19/22 1927 02/19/22 2339 02/20/22 0402 02/20/22 0721 02/20/22 1147  GLUCAP 116* 120* 129* 102* 98    No results found for this or any previous visit (from the past 240 hour(s)).   Radiology Studies: No results found.   Pamella Pert, MD, PhD [facility]  Between 7 am - 7 pm I am available, please contact me via Amion (for emergencies) or Securechat (non urgent messages)  Between 7 pm - 7 am I am not available, please contact night coverage MD/APP via Amion

## 2022-02-23 ENCOUNTER — Other Ambulatory Visit (HOSPITAL_COMMUNITY): Payer: Self-pay

## 2022-02-23 ENCOUNTER — Other Ambulatory Visit: Payer: Self-pay

## 2022-02-23 ENCOUNTER — Other Ambulatory Visit: Payer: Self-pay | Admitting: Family Medicine

## 2022-02-23 MED ORDER — AMLODIPINE BESYLATE 5 MG PO TABS
5.0000 mg | ORAL_TABLET | Freq: Every day | ORAL | 0 refills | Status: DC
  Filled 2022-02-23: qty 30, 30d supply, fill #0

## 2022-02-23 MED ORDER — CARVEDILOL 3.125 MG PO TABS
3.1250 mg | ORAL_TABLET | Freq: Two times a day (BID) | ORAL | 0 refills | Status: DC
  Filled 2022-02-23: qty 60, 30d supply, fill #0

## 2022-02-23 MED ORDER — OXYCODONE HCL 5 MG PO TABS
2.5000 mg | ORAL_TABLET | ORAL | 0 refills | Status: DC | PRN
  Filled 2022-02-23: qty 10, 4d supply, fill #0

## 2022-02-23 MED ORDER — APIXABAN 5 MG PO TABS
5.0000 mg | ORAL_TABLET | Freq: Two times a day (BID) | ORAL | 0 refills | Status: DC
  Filled 2022-02-23: qty 60, 30d supply, fill #0

## 2022-02-23 MED ORDER — QUETIAPINE FUMARATE 25 MG PO TABS
25.0000 mg | ORAL_TABLET | Freq: Every day | ORAL | 0 refills | Status: DC
  Filled 2022-02-23: qty 30, 30d supply, fill #0

## 2022-02-23 NOTE — Discharge Instructions (Signed)
Information on my medicine - ELIQUIS (apixaban)  Why was Eliquis prescribed for you? Eliquis was prescribed to treat blood clots that may have been found in the veins of your legs (deep vein thrombosis) or in your lungs (pulmonary embolism) and to reduce the risk of them occurring again.  What do You need to know about Eliquis ? The the dose is ONE 5 mg tablet taken TWICE daily.  Eliquis may be taken with or without food.   Try to take the dose about the same time in the morning and in the evening. If you have difficulty swallowing the tablet whole please discuss with your pharmacist how to take the medication safely.  Take Eliquis exactly as prescribed and DO NOT stop taking Eliquis without talking to the doctor who prescribed the medication.  Stopping may increase your risk of developing a new blood clot.  Refill your prescription before you run out.  After discharge, you should have regular check-up appointments with your healthcare provider that is prescribing your Eliquis.    What do you do if you miss a dose? If a dose of ELIQUIS is not taken at the scheduled time, take it as soon as possible on the same day and twice-daily administration should be resumed. The dose should not be doubled to make up for a missed dose.  Important Safety Information A possible side effect of Eliquis is bleeding. You should call your healthcare provider right away if you experience any of the following: Bleeding from an injury or your nose that does not stop. Unusual colored urine (red or dark brown) or unusual colored stools (red or black). Unusual bruising for unknown reasons. A serious fall or if you hit your head (even if there is no bleeding).  Some medicines may interact with Eliquis and might increase your risk of bleeding or clotting while on Eliquis. To help avoid this, consult your healthcare provider or pharmacist prior to using any new prescription or non-prescription medications,  including herbals, vitamins, non-steroidal anti-inflammatory drugs (NSAIDs) and supplements.  This website has more information on Eliquis (apixaban): http://www.eliquis.com/eliquis/home

## 2022-02-23 NOTE — Progress Notes (Signed)
RN printed d/c summary for PTAR. Belongings w/ pt. Pt's husband coming to bedside. Awaiting TOC meds to be delivered b/f pt transported home by PTAR.  1327 PTAR here to transport pt home. RN gave TOC meds to PTAR.

## 2022-02-23 NOTE — Progress Notes (Signed)
Physical Therapy Treatment Patient Details Name: Nicole Cunningham MRN: [id number] DOB: 1960/07/20 Today's Date: 02/23/2022   History of Present Illness 61 y.o. female [facility] hospital on [facility]22 with AMS and SOB. Pt admitted with shock, AKI. CT abdomen pelvis 17 mm nonobstructive renal calculus without hydronephrosis. Pt intubated on 11/6. Pt developed acute systolic HF. Bronchoscopy 11/14 for worsening PNA. extubated 11/16. PMH includes COPD and HTN.    PT Comments    Pt is making good progress, demonstrating improved initiation with all bed mobility today. Pt only required minA for rolling and transitioning supine <> sit EOB using bed functions today. She was unable to scoot along EOB though due to poor comprehension of the cues provided. Called husband and discussed a rolling schedule and floating her heels to prevent pressure ulcers and on how to physically assist her safely with bed mobility. He verbalized understanding. Will continue to follow acutely.     Recommendations for follow up therapy are one component of a multi-disciplinary discharge planning process, led by the attending physician.  Recommendations may be updated based on patient status, additional functional criteria and insurance authorization.  Follow Up Recommendations  Skilled nursing-short term rehab (<3 hours/day) (plan appears to be home with hospice though) Can patient physically be transported by private vehicle: No   Assistance Recommended at Discharge Frequent or constant Supervision/Assistance  Patient can return home with the following Two people to help with walking and/or transfers;Two people to help with bathing/dressing/bathroom;Assistance with cooking/housework;Assistance with feeding;Direct supervision/assist for medications management;Direct supervision/assist for financial management;Assist for transportation;Help with stairs or ramp for entrance   Equipment Recommendations  Hospital bed;Other  (comment);Wheelchair (measurements PT);Wheelchair cushion (measurements PT);BSC/3in1 (hoyer lift)    Recommendations for Other Services       Precautions / Restrictions Precautions Precautions: Fall;Other (comment) Precaution Comments: necrotic digits in all extremities Restrictions Weight Bearing Restrictions: No     Mobility  Bed Mobility Overal bed mobility: Needs Assistance Bed Mobility: Supine to Sit, Sit to Supine, Rolling Rolling: Min assist   Supine to sit: Min assist, HOB elevated Sit to supine: Min assist, HOB elevated   General bed mobility comments: Pt demonstrating improved initiation throughout, bringing legs off EOB without assistance but needing minA to ascend trunk and scoot to EOB. Pt able to initiate lifting of legs back onto bed but needed minA to complete and control trunk to return to supine. MinA to roll.    Transfers                   General transfer comment: Attempted to cue pt to scoot laterally along EOB, but pt not understanding the cues and instead scooting back in bed and returning to supine.    Ambulation/Gait                   Stairs             Wheelchair Mobility    Modified Rankin (Stroke Patients Only)       Balance Overall balance assessment: Needs assistance Sitting-balance support: Feet supported Sitting balance-[name]: Fair                                      Cognition Arousal/Alertness: Awake/alert Behavior During Therapy: WFL for tasks assessed/performed Overall Cognitive Status: Impaired/Different from baseline Area of Impairment: Attention, Following commands, Safety/judgement, Awareness, Problem solving  Current Attention Level: Selective   Following Commands: Follows one step commands consistently, Follows one step commands with increased time, Follows multi-step commands inconsistently Safety/Judgement: Decreased awareness of safety, Decreased  awareness of deficits Awareness: Emergent Problem Solving: Slow processing, Decreased initiation, Requires verbal cues, Difficulty sequencing, Requires tactile cues General Comments: slow processing, able to follow single step commands but has difficulty with muti-step cues.        Exercises      General Comments General comments (skin integrity, edema, etc.): called husband prior to working with pt to discuss pt's need for assistance and how to properly manage her physically with bed mobility and how to prevent pressure ulcers at home, he verbalized understanding and had requested to discuss this over the phone as he was not planning to come into the hospital today; tried to call husband after session to update him on how much better she had done today, but sent to voicemail, left a message without pt idenitifiers.      Pertinent Vitals/Pain Pain Assessment Pain Assessment: Faces Faces Pain Scale: Hurts a little bit Pain Location: generalized grimacing with mobility Pain Descriptors / Indicators: Grimacing Pain Intervention(s): Limited activity within patient's tolerance, Monitored during session, Repositioned    Home Living                          Prior Function            PT Goals (current goals can now be found in the care plan section) Acute Rehab PT Goals Patient Stated Goal: to improve strength and reduce pain PT Goal Formulation: With patient/family Time For Goal Achievement: 02/23/22 Potential to Achieve Goals: Fair Progress towards PT goals: Progressing toward goals    Frequency    Min 2X/week      PT Plan Equipment recommendations need to be updated    Co-evaluation              AM-PAC PT "6 Clicks" Mobility   Outcome Measure  Help needed turning from your back to your side while in a flat bed without using bedrails?: A Little Help needed moving from lying on your back to sitting on the side of a flat bed without using bedrails?: A  Little Help needed moving to and from a bed to a chair (including a wheelchair)?: Total Help needed standing up from a chair using your arms (e.g., wheelchair or bedside chair)?: Total Help needed to walk in hospital room?: Total Help needed climbing 3-5 steps with a railing? : Total 6 Click Score: 10    End of Session   Activity Tolerance: Patient tolerated treatment well Patient left: in bed;with call bell/phone within reach;with bed alarm set   PT Visit Diagnosis: Other abnormalities of gait and mobility (R26.89);Pain;Muscle weakness (generalized) (M62.81)     Time: 8757-9728 PT Time Calculation (min) (ACUTE ONLY): 20 min  Charges:  $Therapeutic Activity: 8-22 mins                     Raymond Gurney, PT, DPT Acute Rehabilitation Services  Office: [phone]    Nicole Cunningham 02/23/2022, 1:29 PM

## 2022-02-23 NOTE — Discharge Summary (Signed)
Physician Discharge Summary  Nicole Cunningham [id number] DOB: November 07, 1960 DOA: 01/28/2022  PCP: [name], MD  Admit date: 01/28/2022 Discharge date: 02/23/2022  Admitted From: home Disposition:  home with hospice  Recommendations for Outpatient Follow-up:  Follow up with hospice  Home Health: none Equipment/Devices: none  Discharge Condition: stable CODE STATUS: DNR Diet Orders (From admission, onward)     Start     Ordered   02/16/22 0935  DIET DYS 2 Room service appropriate? Yes with Assist; Fluid consistency: Thin  Diet effective now       Question Answer Comment  Room service appropriate? Yes with Assist   Fluid consistency: Thin      02/16/22 0935            HPI: Per admitting MD, Patient is a 61 year old female with pertinent PMH of COPD, anemia, HTN presents to [facility] ED on 11/6 with AMS. Patient recently admitted to [facility] with AMS on 11/2021.  Found to have UTI and pyelonephritis.  On 11/5, patient having increased confusion.  Having some SOB over the past couple of days.  [facility] for [facility] eval. Upon arrival to [facility] ED, patient confused but able to state name. Tachycardic.  Mucous membranes appear dry.  Tmax 102 F.  Initial BP 93/62.  Respirations in 20s to 30s with sats 100% on room air.  CBG 90.  Patient started on IV fluids and cultures obtained.  Started on broad-spectrum antibiotics.  Initial LA 8.5 then 9.  More IV fluids given.  Patient became more hypotensive despite IV fluids.  Starting on low-dose levo.  PCCM consulted.  Hospital Course / Discharge diagnoses: Principal Problem:   Septic shock ([place]) Active Problems:   Sepsis ([place])   Acute encephalopathy   Pressure injury of skin   Principal problem Shock-mixed cardiogenic and septic in the setting of E. coli bacteremia.  Initial 2D echo on 11/6 showed an EF of 20-25%, global hypokinesis and cardiology was consulted and followed patient while hospitalized.  This was likely in the setting  of gram-negative bacteremia.  Repeat echo on 11/20 showed recovered EF to [id number], grade 1 diastolic dysfunction, RV was normal.  She was on pressors and dobutamine, and now they have been weaned off and vitals have remained stable   Active problems E. coli bacteremia-with septic shock.  She is status post 10 days of meropenem.  Acute hypoxic respiratory failure, underlying COPDsecondary to severe sepsis, multifocal pneumonia.  Respiratory status improved, she is currently on room air. Acute systolic CHF-with cardiogenic shock.  Shock physiology has resolved and EF has normalized.  This was likely in the setting of gram-negative bacteremia Septic encephalopathy-Resolved.  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 Bilateral upper extremity arterial duplex did not show a thrombus.  She was initially placed on IV heparin, but eventually transition to Eliquis.  Vascular surgery evaluated patient, felt that the hands are nonsalvageable Dysphagia-Patient underwent MBS on 02/14/2022 and was started on dysphagia 1 diet, SLP following, advanced to dysphagia 2 diet.  Slow progression AKI-secondary to ATN, shock, resolved Pancytopenia/microcytic anemia-due to critical illness, s.p 5 units of PRBC transfusion and 2 units of platelet transfusion during this hospitalization.  Hemoglobin and platelets are now stable Severe protein calorie malnutrition -Continue supplements as tolerated, now off tube feeds Hypokalemia/hypernatremia -Replaced Nonobstructing ureteral stone -clinically silent now Thrombocytopenia -  resolved Goals of care/deconditioning/debility - Palliative care closely following.  Currently DNR.  Plan for home with hospice   Discharge Instructions   Allergies as of 02/23/2022   No Active Allergies      Medication List     STOP taking these  medications    losartan-hydrochlorothiazide 100-25 MG tablet Commonly known as: HYZAAR   mirtazapine 30 MG tablet Commonly known as: REMERON   potassium chloride SA 20 MEQ tablet Commonly known as: KLOR-CON M       TAKE these medications    albuterol 108 (90 Base) MCG/ACT inhaler Commonly known as: VENTOLIN HFA Inhale 1-2 puffs into the lungs every 6 (six) hours as needed for wheezing or shortness of breath.   amLODipine 5 MG tablet Commonly known as: NORVASC Take 1 tablet (5 mg total) by mouth daily.   apixaban 5 MG Tabs tablet Commonly known as: ELIQUIS Take 1 tablet (5 mg total) by mouth 2 (two) times daily.   Breo Ellipta 200-25 MCG/ACT Aepb Generic drug: fluticasone furoate-vilanterol inhale 1 puff into the lungs daily   carvedilol 3.125 MG tablet Commonly known as: COREG Take 1 tablet (3.125 mg total) by mouth 2 (two) times daily with a meal.   ferrous sulfate 325 (65 FE) MG tablet Take 1 tablet (325 mg total) by mouth 2 (two) times daily with a meal. What changed: when to take this   FLUoxetine 40 MG capsule Commonly known as: PROzac Take 2 capsules (80 mg total) by mouth daily.   gabapentin 400 MG capsule Commonly known as: NEURONTIN Take 1 capsule (400 mg total) by mouth at bedtime. What changed:  when to take this reasons to take this   oxyCODONE 5 MG immediate release tablet Commonly known as: Oxy IR/ROXICODONE Take 0.5 tablets (2.5 mg total) by mouth every 4 (four) hours as needed for moderate pain.   QUEtiapine 25 MG tablet Commonly known as: SEROQUEL Take 1 tablet (25 mg total) by mouth at bedtime. What changed:  medication strength how much to take        Follow-up Information     [facility] Follow up.   Specialty: Hospice and Palliative Medicine Why: Home Hospice RN will call to arrange visit after discharge home Contact information: [place] [phone]                 Consultations: PCCM Cardiology Vascular surgery Palliative care  Procedures/Studies:  DG Swallowing Func-Speech Pathology  Result Date: 02/14/2022 Table formatting from the original result was not included. Objective Swallowing Evaluation: Type of Study: MBS-Modified Barium Swallow Study  Patient Details Name: LARETA LEGGITT MRN: [id number] Date of Birth: 02/07/1961 Today's Date: 02/14/2022 Time: SLP Start Time (ACUTE ONLY): 63 -SLP Stop Time (ACUTE ONLY): 1330 SLP Time Calculation (min) (ACUTE ONLY): 20 min Past Medical History: Past Medical History: Diagnosis Date  Bronchitis   COPD (chronic obstructive pulmonary disease) ([place])   Hypertension  Past Surgical History: No past surgical history on file. HPI: Patient is a 61 year old female who presented to [facility] ED on 11/6 with AMS, UTI and pyelonephritis, cardiogenic and septic shock, AKI, acute hypoxic respiratory failure due to multifocal pna caused by ESBL enterobacter, L parapneumonic effusion, bilateral finger dry gangrene.  ETT 11/6-11/16. PMH of COPD, anemia, HTN. Failed Yale swallow screen after extubation.  Subjective: alert  Recommendations for follow up therapy are one component of a multi-disciplinary discharge planning process, led by the attending physician.  Recommendations may be updated based  on patient status, additional functional criteria and insurance authorization. Assessment / Plan / Recommendation   02/14/2022   1:49 PM Clinical Impressions Clinical Impression Pt presents with oropharyngeal dysphagia characterized by impaired mastication, weak lingual manipulation, impaired posterior propulsion, reduced anterior laryngeal movement, and a pharyngeal delay. She demonstrated prolonged mastication, difficulty with A-P transport, pyriform sinus residue and incomplete epiglottic inversion. Penetration (PAS 2,3) was noted with thin liquids and with nectar thick liquids. Aspiration (PAS 8) was observed with the initial two swallows of  thin liquids via cup and straw secondary to the pharyngeal delay and spillover of residue in the pyriform sinuses. Very delayed weak coughing was inconsistently noted after aspiration. Prompted coughing mobilized aspirate above the vocal folds, but was ineffective in expelling it from the larynx. Laryngeal invasion worsened with use of a chin tuck posture. With prompts (i.e., "swallow hard" or "squeeze your muscles when you swallow") for an effortful swallow, penetration and aspiration were eliminated, but even with swallows that appeared less effortful, penetration (PAS 2) was WNL. A dysphagia 1 diet with thin liquids is recommended with observance of swallowing precautions. SLP Visit Diagnosis Dysphagia, oropharyngeal phase (R13.12) Impact on safety and function Mild aspiration risk     02/14/2022   1:49 PM Treatment Recommendations Treatment Recommendations Therapy as outlined in treatment plan below     02/14/2022   1:49 PM Prognosis Prognosis for Safe Diet Advancement Good   02/14/2022   1:49 PM Diet Recommendations SLP Diet Recommendations Dysphagia 1 (Puree) solids;Thin liquid Liquid Administration via Cup;Straw Medication Administration Crushed with puree Compensations Slow rate;Small sips/bites;Effortful swallow Postural Changes Seated upright at 90 degrees     02/14/2022   1:49 PM Other Recommendations Oral Care Recommendations Oral care BID Follow Up Recommendations Skilled nursing-short term rehab (<3 hours/day) Functional Status Assessment Patient has had a recent decline in their functional status and demonstrates the ability to make significant improvements in function in a reasonable and predictable amount of time.   02/14/2022   1:49 PM Frequency and Duration  Speech Therapy Frequency (ACUTE ONLY) min 2x/week Treatment Duration 2 weeks     02/14/2022   1:49 PM Oral Phase Oral Phase Impaired Oral - Nectar Straw Weak lingual manipulation;Reduced posterior propulsion Oral - Thin Cup Weak lingual  manipulation;Reduced posterior propulsion Oral - Thin Straw Weak lingual manipulation;Reduced posterior propulsion Oral - Puree Weak lingual manipulation;Reduced posterior propulsion Oral - Regular Weak lingual manipulation;Reduced posterior propulsion;Impaired mastication Oral - Pill Weak lingual manipulation;Reduced posterior propulsion;Impaired mastication    02/14/2022   1:49 PM Pharyngeal Phase Pharyngeal Phase Impaired Pharyngeal- Nectar Cup Delayed swallow initiation-vallecula;Reduced anterior laryngeal mobility;Reduced epiglottic inversion;Pharyngeal residue - pyriform Pharyngeal- Nectar Straw Delayed swallow initiation-vallecula;Reduced anterior laryngeal mobility;Reduced epiglottic inversion;Pharyngeal residue - pyriform Pharyngeal- Thin Cup Delayed swallow initiation-vallecula;Reduced anterior laryngeal mobility;Reduced epiglottic inversion;Pharyngeal residue - pyriform;Penetration/Aspiration during swallow;Penetration/Apiration after swallow;Delayed swallow initiation-pyriform sinuses Pharyngeal Material enters airway, remains ABOVE vocal cords then ejected out;Material enters airway, remains ABOVE vocal cords and not ejected out;Material enters airway, passes BELOW cords without attempt by patient to eject out (silent aspiration) Pharyngeal- Thin Straw Delayed swallow initiation-vallecula;Reduced anterior laryngeal mobility;Reduced epiglottic inversion;Pharyngeal residue - pyriform;Penetration/Aspiration during swallow;Penetration/Apiration after swallow;Delayed swallow initiation-pyriform sinuses Pharyngeal Material enters airway, remains ABOVE vocal cords and not ejected out;Material enters airway, remains ABOVE vocal cords then ejected out;Material enters airway, passes BELOW cords without attempt by patient to eject out (silent aspiration) Pharyngeal- Puree Delayed swallow initiation-vallecula;Reduced anterior laryngeal mobility;Reduced epiglottic inversion;Pharyngeal residue - pyriform Pharyngeal-  Regular Delayed swallow initiation-vallecula;Reduced anterior  laryngeal mobility;Reduced epiglottic inversion;Pharyngeal residue - pyriform    02/14/2022   1:49 PM Cervical Esophageal Phase  Cervical Esophageal Phase [facility] [address] Hardin Negus, [place], [place] Office number [phone] Horton Marshall 02/14/2022, 2:38 PM                     ECHOCARDIOGRAM COMPLETE  Result Date: 02/12/2022    ECHOCARDIOGRAM REPORT   Patient Name:   RAGINE BALDER Date of Exam: 02/12/2022 Medical Rec #:  [id number]        Height:       64.0 in Accession #:    [id number]       Weight:       120.8 lb Date of Birth:  [birth year]        BSA:          1.579 m Patient Age:    60 years         BP:           148/71 mmHg Patient Gender: F                HR:           117 bpm. Exam Location:  Inpatient Procedure: 2D Echo, Cardiac Doppler and Color Doppler Indications:    CHF-acute systolic  History:        Patient has prior history of Echocardiogram examinations, most                 recent 01/29/2017. COPD; Risk Factors:Hypertension and Former                 Smoker.  Sonographer:    Clayton Lefort RDCS (AE) Referring Phys: Larey Dresser  Sonographer Comments: Patient agitated throughout test. Patient in Fowler's position. Patient uncooperative with breathing and sniff test. IMPRESSIONS  1. Compared with echo 123456, systolic function has normalized.  2. Sinus tachycardia in the 120s-130s throughout the study.  3. Intracavitary gradient. Peak velocity 1.71 m/s. Peak gradietn 11.7 mmHg. Left ventricular ejection fraction, by estimation, is 70 to 75%. The left ventricle has hyperdynamic function. The left ventricle has no regional wall motion abnormalities. Left  ventricular diastolic parameters are consistent with Grade I diastolic dysfunction (impaired relaxation).  4. Right ventricular systolic function is normal. The right ventricular size is normal. There is normal pulmonary artery systolic pressure.  5. The  mitral valve is normal in structure. No evidence of mitral valve regurgitation. No evidence of mitral stenosis.  6. Aortic vavle gradients were mildly elevated, but the aortic valve leaflets are not stenotic. This may be due to high flow state. Cannot rule out subvalvular or supravalvular membrane, though none was visualized. Consider repeating limited echo to see  if gradients normalize at lower heart rate. The aortic valve is tricuspid. Aortic valve regurgitation is not visualized. No aortic stenosis is present. Aortic valve area, by VTI measures 2.38 cm. Aortic valve mean gradient measures 11.0 mmHg. Aortic valve Vmax measures 2.07 m/s.  7. The inferior vena cava is normal in size with greater than 50% respiratory variability, suggesting right atrial pressure of 3 mmHg. FINDINGS  Left Ventricle: Intracavitary gradient. Peak velocity 1.71 m/s. Peak gradietn 11.7 mmHg. Left ventricular ejection fraction, by estimation, is 70 to 75%. The left ventricle has hyperdynamic function. The left ventricle has no regional wall motion abnormalities. The left ventricular internal cavity size was normal in size. There is no left ventricular hypertrophy. Left ventricular diastolic parameters are consistent  with Grade I diastolic dysfunction (impaired relaxation). Right Ventricle: The right ventricular size is normal. No increase in right ventricular wall thickness. Right ventricular systolic function is normal. There is normal pulmonary artery systolic pressure. The tricuspid regurgitant velocity is 1.59 m/s, and  with an assumed right atrial pressure of 3 mmHg, the estimated right ventricular systolic pressure is [id number]. Left Atrium: Left atrial size was normal in size. Right Atrium: Right atrial size was normal in size. Pericardium: There is no evidence of pericardial effusion. Mitral Valve: The mitral valve is normal in structure. No evidence of mitral valve regurgitation. No evidence of mitral valve stenosis. Tricuspid  Valve: The tricuspid valve is normal in structure. Tricuspid valve regurgitation is trivial. No evidence of tricuspid stenosis. Aortic Valve: Aortic vavle gradients were mildly elevated, but the aortic valve leaflets are not stenotic. This may be due to high flow state. Cannot rule out subvalvular or supravalvular membrane, though none was visualized. Consider repeating limited echo to see if gradients normalize at lower heart rate. The aortic valve is tricuspid. Aortic valve regurgitation is not visualized. No aortic stenosis is present. Aortic valve mean gradient measures 11.0 mmHg. Aortic valve peak gradient measures 17.1 mmHg. Aortic valve area, by VTI measures 2.38 cm. Pulmonic Valve: The pulmonic valve was normal in structure. Pulmonic valve regurgitation is not visualized. No evidence of pulmonic stenosis. Aorta: The aortic root is normal in size and structure. Venous: The inferior vena cava is normal in size with greater than 50% respiratory variability, suggesting right atrial pressure of 3 mmHg. IAS/Shunts: No atrial level shunt detected by color flow Doppler.  LEFT VENTRICLE PLAX 2D LVIDd:         3.60 cm LVIDs:         3.30 cm LV PW:         0.90 cm LV IVS:        1.00 cm LVOT diam:     1.80 cm LV SV:         68 LV SV Index:   43 LVOT Area:     2.54 cm  RIGHT VENTRICLE RV Basal diam:  2.30 cm RV S prime:     20.90 cm/s TAPSE (M-mode): 1.9 cm LEFT ATRIUM             Index        RIGHT ATRIUM          Index LA diam:        2.70 cm 1.71 cm/m   RA Area:     7.58 cm LA Vol (A2C):   51.2 ml 32.42 ml/m  RA Volume:   12.70 ml 8.04 ml/m LA Vol (A4C):   41.4 ml 26.22 ml/m LA Biplane Vol: 49.5 ml 31.35 ml/m  AORTIC VALVE AV Area (Vmax):    2.46 cm AV Area (Vmean):   2.28 cm AV Area (VTI):     2.38 cm AV Vmax:           207.00 cm/s AV Vmean:          156.000 cm/s AV VTI:            0.288 m AV Peak Grad:      17.1 mmHg AV Mean Grad:      11.0 mmHg LVOT Vmax:         200.00 cm/s LVOT Vmean:        140.000  cm/s LVOT VTI:          0.269 m LVOT/AV  VTI ratio: 0.93  AORTA Ao Root diam: 3.40 cm TRICUSPID VALVE TR Peak grad:   10.1 mmHg TR Vmax:        159.00 cm/s  SHUNTS Systemic VTI:  0.27 m Systemic Diam: 1.80 cm Skeet Latch MD Electronically signed by Skeet Latch MD Signature Date/Time: 02/12/2022/10:13:15 AM    Final    DG CHEST PORT 1 VIEW  Result Date: 02/08/2022 CLINICAL DATA:  Intubation, chest tube, pleural effusion EXAM: PORTABLE CHEST 1 VIEW COMPARISON:  Portable exam 0929 hours compared to 02/06/2022 FINDINGS: Tip of endotracheal tube projects 2.4 cm above carina. Feeding tube traverses stomach into proximal small bowel. Nasogastric tube tip projects over stomach. LEFT arm PICC line tip projects over SVC. RIGHT jugular central venous catheter tip projects over SVC. Pigtail LEFT thoracostomy tube unchanged. Normal heart size, mediastinal contours, and pulmonary vascularity. Atherosclerotic calcification aorta. Improved pulmonary infiltrates. Improved atelectasis versus consolidation LEFT lower lobe. No pleural effusion or pneumothorax. IMPRESSION: Improved aeration bilaterally. LEFT thoracostomy tube without pneumothorax. Aortic Atherosclerosis (ICD10-I70.0). Electronically Signed   By: Lavonia Dana M.D.   On: 02/08/2022 09:39   DG CHEST PORT 1 VIEW  Result Date: 02/06/2022 CLINICAL DATA:  Follow-up ventilator support EXAM: PORTABLE CHEST 1 VIEW COMPARISON:  Earlier same day FINDINGS: Endotracheal tube tip 3 cm above the carina. Orogastric or nasogastric tube enters the stomach. Right internal jugular central line tip at the SVC RA junction. Left chest tube in place. Edema pattern has worsened slightly. Worsened volume loss in the left lower lobe. IMPRESSION: Lines and tubes well positioned. Worsened edema pattern. Worsened volume loss in the left lower lobe. Electronically Signed   By: Nelson Chimes M.D.   On: 02/06/2022 12:35   DG CHEST PORT 1 VIEW  Result Date: 02/06/2022 CLINICAL DATA:   Ventilator dependence. EXAM: PORTABLE CHEST 1 VIEW COMPARISON:  Radiographs 02/03/2022 and 02/02/2022.  CT 03/14/2021. FINDINGS: 0515 hours. Endotracheal tube, feeding tube, enteric tube and right IJ hemodialysis catheter are unchanged in position. A left arm PICC projects to the lower SVC level and appears unchanged. Small caliber left chest tube is unchanged in position. The heart size and mediastinal contours are stable. There are persistent patchy airspace opacities bilaterally with interval worsening in the right upper lobe. No pneumothorax or significant pleural effusion identified. The bones appear unchanged. IMPRESSION: Interval worsening of right upper lobe airspace opacity. Otherwise stable appearance of the chest. Stable support system. No pneumothorax. Electronically Signed   By: Richardean Sale M.D.   On: 02/06/2022 08:16   VAS [place] UPPER EXTREMITY ARTERIAL DUPLEX  Result Date: 02/04/2022  UPPER EXTREMITY DUPLEX STUDY Patient Name:  ALAHIA KNUEVEN  Date of Exam:   02/04/2022 Medical Rec #: [id number]         Accession #:    [id number] Date of Birth: 06/13/1960         Patient Gender: F Patient Age:   25 years Exam Location[facility]  [facility] Procedure:      VAS [place] UPPER EXTREMITY ARTERIAL DUPLEX Referring Phys: Ina Homes --------------------------------------------------------------------------------  Indications: Bilateral upper extremity ischemia. Cold, purple hands. Septic              shock, E-Coli, patient on pressors. Multisystem organ failure.  Risk Factors: Hypertension. Limitations: Intubation, multiple bandages and lines. Comparison Study: No prior study Performing Technologist: Sharion Dove RVS  Examination Guidelines: A complete evaluation includes B-mode imaging, spectral Doppler, color Doppler, and power Doppler as needed of all accessible portions of each vessel.  Bilateral testing is considered an integral part of a complete examination. Limited examinations for reoccurring  indications may be performed as noted.  Right Doppler Findings: +---------------+----------+---------+--------+--------+ Site           PSV (cm/s)Waveform StenosisComments +---------------+----------+---------+--------+--------+ Subclavian Mid 79        triphasic                 +---------------+----------+---------+--------+--------+ Subclavian Dist                                    +---------------+----------+---------+--------+--------+ Axillary       95        triphasic                 +---------------+----------+---------+--------+--------+ Brachial Prox  101       triphasic                 +---------------+----------+---------+--------+--------+ Brachial Dist  72        triphasic                 +---------------+----------+---------+--------+--------+ Radial Prox              absent                    +---------------+----------+---------+--------+--------+ Radial Mid               absent                    +---------------+----------+---------+--------+--------+ Radial Dist              absent                    +---------------+----------+---------+--------+--------+ Ulnar Prox               absent                    +---------------+----------+---------+--------+--------+ Ulnar Mid                absent                    +---------------+----------+---------+--------+--------+ Ulnar Dist               absent                    +---------------+----------+---------+--------+--------+ Palmar Arch              absent                    +---------------+----------+---------+--------+--------+   Left Doppler Findings: +--------------+----------+---------+--------+--------+ Site          PSV (cm/s)Waveform StenosisComments +--------------+----------+---------+--------+--------+ Subclavian Mid210       triphasic                 +--------------+----------+---------+--------+--------+ Axillary      98        triphasic                  +--------------+----------+---------+--------+--------+ Brachial Prox 95        triphasic                 +--------------+----------+---------+--------+--------+ Brachial Dist 90        triphasic                 +--------------+----------+---------+--------+--------+ Radial Prox   55  thump                     +--------------+----------+---------+--------+--------+ Radial Mid    38        thump                     +--------------+----------+---------+--------+--------+ Radial Dist             absent                    +--------------+----------+---------+--------+--------+ Ulnar Prox              absent                    +--------------+----------+---------+--------+--------+ Ulnar Mid               absent                    +--------------+----------+---------+--------+--------+ Ulnar Dist              absent                    +--------------+----------+---------+--------+--------+ Palmar Arch             absent                    +--------------+----------+---------+--------+--------+   Summary:  Right: Patent subclavian, axillary, and brachial arteries with        triphasic flow. No flow noted throughout the radial and ulnar        arteries or in the palm. Left: Patent subclavian, axillary, and brachial arteries with       triphasic flow. No flow noted in the entirety of the ulnar       artery or in the palm. There is "thumping flow" noted in the       proximal to mid radial artery and no flow noted mid to distal       radial artery. *See table(s) above for measurements and observations. Electronically signed by Monica Martinez MD on 02/04/2022 at 2:27:52 PM.    Final    DG Chest Port 1 View  Result Date: 02/03/2022 CLINICAL DATA:  Chest tube EXAM: PORTABLE CHEST 1 VIEW COMPARISON:  02/02/2022 FINDINGS: Endotracheal tube, feeding tube, NG tube and left chest tube remain in place, unchanged. Right dialysis catheter is also in stable  position. No pneumothorax. Heart is normal size. Bilateral interstitial prominence again noted, right greater than left. Small bilateral effusions suspected. IMPRESSION: Stable support devices.  No pneumothorax. Stable interstitial prominence, right greater than left with small effusions. Electronically Signed   By: Rolm Baptise M.D.   On: 02/03/2022 08:10   DG Chest Port 1 View  Result Date: 02/02/2022 CLINICAL DATA:  Left chest tube placement for pleural effusion. EXAM: PORTABLE CHEST 1 VIEW COMPARISON:  02/02/2022 FINDINGS: Endotracheal tube tip is 3.0 cm above the carina. Feeding tube extends into the stomach and potentially into the small bowel. Nasogastric tube tip is in the stomach fundus with side port in the stomach antrum. Right IJ central line tip: Right atrium. Left PICC line tip: SVC. A newly placed pigtail pleural drainage catheter is present on the left side. Previous left pleural effusion no longer observed. Atherosclerotic calcification of the aortic arch. Heart size within normal limits. Bilateral interstitial accentuation is mildly improved although not resolved. Bony demineralization noted. IMPRESSION: 1. New left-sided pigtail pleural drainage catheter with resolution of the  left pleural effusion. 2. Bilateral interstitial accentuation is mildly improved although not resolved. 3. Bony demineralization. 4. Tubes and lines appear satisfactorily positioned. Electronically Signed   By: Van Clines M.D.   On: 02/02/2022 14:06   DG Abd Portable 1V  Result Date: 02/02/2022 CLINICAL DATA:  Feeding tube placement. EXAM: PORTABLE ABDOMEN - 1 VIEW COMPARISON:  None Available. FINDINGS: 1019 hours. Feeding tube tip is probably transpyloric in the proximal jejunum with less likely possibility of the feeding tube coiled in the stomach. NG tube tip is in the proximal stomach with side port in the distal esophagus above the GE junction. Visualized abdomen demonstrates nonspecific bowel gas  pattern. IMPRESSION: 1. Feeding tube tip is probably transpyloric in the proximal jejunum with coiling of the tube in the stomach a less likely possibility. Repeat x-ray after injection of a small amount of contrast through the feeding tube could confirm enteric location as clinically warranted. 2. NG tube tip is in the proximal stomach with side port in the distal esophagus above the GE junction. Electronically Signed   By: Misty Stanley M.D.   On: 02/02/2022 10:29   DG Chest Port 1 View  Result Date: 02/02/2022 CLINICAL DATA:  Acute respiratory failure and OG tube placement. EXAM: PORTABLE ABDOMEN - 1 VIEW; PORTABLE CHEST - 1 VIEW COMPARISON:  02/01/2022. FINDINGS: The cardiac silhouette and mediastinal contour is stable. There is atherosclerotic calcification of the aorta. And pulmonary vasculature is distended. Interstitial and airspace opacities are noted in the lungs bilaterally, not significantly changed from the prior exam. There is a small left pleural effusion. No pneumothorax. An enteric tube terminates in the stomach. A stable right internal jugular central venous catheter is noted. An endotracheal tube terminates 1.2 cm above the carina. IMPRESSION: 1. Stable interstitial and airspace opacities in the lungs bilaterally, possible edema or pneumonitis. 2. Small left pleural effusion. 3. Medical devices as described above. Electronically Signed   By: Brett Fairy M.D.   On: 02/02/2022 04:53   DG Abd Portable 1V  Result Date: 02/02/2022 CLINICAL DATA:  Acute respiratory failure and OG tube placement. EXAM: PORTABLE ABDOMEN - 1 VIEW; PORTABLE CHEST - 1 VIEW COMPARISON:  02/01/2022. FINDINGS: The cardiac silhouette and mediastinal contour is stable. There is atherosclerotic calcification of the aorta. And pulmonary vasculature is distended. Interstitial and airspace opacities are noted in the lungs bilaterally, not significantly changed from the prior exam. There is a small left pleural effusion.  No pneumothorax. An enteric tube terminates in the stomach. A stable right internal jugular central venous catheter is noted. An endotracheal tube terminates 1.2 cm above the carina. IMPRESSION: 1. Stable interstitial and airspace opacities in the lungs bilaterally, possible edema or pneumonitis. 2. Small left pleural effusion. 3. Medical devices as described above. Electronically Signed   By: Brett Fairy M.D.   On: 02/02/2022 04:53   DG Chest Port 1 View  Result Date: 02/01/2022 CLINICAL DATA:  Acute respiratory failure (HCC) EXAM: PORTABLE CHEST 1 VIEW COMPARISON:  Chest x-ray January 31, 2022. FINDINGS: Similar diffuse interstitial opacities with mildly increased bibasilar opacities, probably combination of layering bilateral pleural effusions overlying atelectasis and/or consolidation. No visible pneumothorax. Similar cardiomediastinal silhouette. Endotracheal tube tip at the level of clavicular heads. Right IJ central venous catheter tip near the superior cavoatrial junction. Enteric tube courses below the diaphragm and outside the field of view. Polyarticular degenerative change. IMPRESSION: Similar diffuse interstitial opacities. Mildly increased bibasilar opacities, probably a combination of layering bilateral pleural effusions overlying  atelectasis and/or consolidation. Electronically Signed   By: Margaretha Sheffield M.D.   On: 02/01/2022 08:15   DG Abd Portable 1V  Result Date: 01/31/2022 CLINICAL DATA:  Feeding tube placement EXAM: PORTABLE ABDOMEN - 1 VIEW COMPARISON:  01/30/2022 FINDINGS: There is interval exchange of enteric tubes with feeding tube in the distal antrum of stomach in the current study. There is 1.4 cm calcific density overlying the left kidney. Linear densities are seen in the lower lung fields suggesting subsegmental atelectasis. Small left pleural effusion is seen. IMPRESSION: Tip of feeding tube is seen in the distal antrum of the stomach. Left renal calculus. Electronically  Signed   By: Elmer Picker M.D.   On: 01/31/2022 15:22   DG Chest Port 1 View  Result Date: 01/31/2022 CLINICAL DATA:  Acute respiratory failure. EXAM: PORTABLE CHEST 1 VIEW COMPARISON:  01/30/2022 FINDINGS: The endotracheal tube, NG tube, left PICC line and right IJ central venous catheters are stable. The cardiac silhouette, mediastinal and hilar contours are within normal limits. Persistent diffuse interstitial and airspace process in the lungs. No pneumothorax or pleural effusion. IMPRESSION: 1. Stable support apparatus. 2. Persistent diffuse interstitial and airspace process. Electronically Signed   By: Marijo Sanes M.D.   On: 01/31/2022 13:04   DG Abd Portable 1V  Result Date: 01/30/2022 CLINICAL DATA:  Encounter for orogastric tube placement. EXAM: PORTABLE ABDOMEN - 1 VIEW COMPARISON:  01/30/2022 at 0852 hours FINDINGS: Orogastric tube extends into the left upper abdomen. The tip is in the gastric body region. Again noted is bowel gas and stool in the lower abdomen and pelvis. Densities at the left lung base could represent consolidation or atelectasis. Evidence for 2 central venous catheter tips in the region of the right atrium. Again noted is a left renal calculus. IMPRESSION: 1. Orogastric tube is in the stomach body region. 2. Concern for densities at the left lung base. Electronically Signed   By: Markus Daft M.D.   On: 01/30/2022 17:01   DG Abd 1 View  Result Date: 01/30/2022 CLINICAL DATA:  Enteric tube placement EXAM: ABDOMEN - 1 VIEW COMPARISON:  CT abdomen and pelvis dated 01/29/2022 FINDINGS: Enteric tube curves in the stomach with tip projecting over the lower esophagus. Nonobstructive bowel gas pattern. No free air or pneumatosis. Moderate volume stool throughout the colon. Left nephrolithiasis and lobulated pelvic calcification are better evaluated on prior CT. No acute or substantial osseous abnormality. The sacrum and coccyx are partially obscured by overlying bowel  contents. IMPRESSION: Enteric tube curves in the stomach with tip projecting over the lower esophagus. Electronically Signed   By: Darrin Nipper M.D.   On: 01/30/2022 10:17   Portable Chest xray  Result Date: 01/30/2022 CLINICAL DATA:  Evaluate ET tube placement EXAM: PORTABLE CHEST 1 VIEW COMPARISON:  01/29/2022 FINDINGS: There is a left arm PICC line with tip terminating at the superior cavoatrial junction. There is a dual lumen right IJ catheter with tip terminating in the right atrium. The ETT tip is 1 cm above the carina. The enteric tube tip is looped within the gastric fundus and re-enters the esophagus with tip terminating 6 cm above the GE junction. Within the mid stable cardiomediastinal contours. Diffuse interstitial opacities are unchanged from prior exam. Right upper lobe airspace disease is also unchanged. IMPRESSION: 1. The enteric tube is again noted to be looped within the stomach with tip in the distal esophagus approximately 6.1 cm proximal to the hemidiaphragms. Consider repositioning. 2. ETT tip is 1  cm above the carina.  Unchanged. 3. No change in aeration to the lungs compared with prior exam. These results will be called to the ordering clinician or representative by the Radiologist Assistant, and communication documented in the PACS or [employer]. Electronically Signed   By: Signa Kell M.D.   On: 01/30/2022 09:02   [place] EKG SITE RITE  Result Date: 01/29/2022 If Site Rite image not attached, placement could not be confirmed due to current cardiac rhythm.  ECHOCARDIOGRAM COMPLETE  Result Date: 01/29/2022    ECHOCARDIOGRAM REPORT   Patient Name:   CHARICE ZUNO Date of Exam: 01/29/2022 Medical Rec #:  [id number]        Height:       64.0 in Accession #:    [id number]       Weight:       93.0 lb Date of Birth:  29-Aug-1960        BSA:          1.413 m Patient Age:    61 years         BP:           124/114 mmHg Patient Gender: F                HR:           100 bpm. Exam Location:   Inpatient Procedure: 2D Echo, Color Doppler and Cardiac Doppler Indications:    I42.9 Cardiomyopathy (unspecified)  History:        Patient has prior history of Echocardiogram examinations, most                 recent 04/06/2021. COPD; Risk Factors:Hypertension.  Sonographer:    Irving Burton Senior RDCS Referring Phys: Oretha Milch  Sonographer Comments: Scanned supine on artificial respirator. IMPRESSIONS  1. Left ventricular ejection fraction, by estimation, is 20 to 25%. The left ventricle has severely decreased function. The left ventricle demonstrates regional wall motion abnormalities in unsusual pattern with severe diffuse hypokinesis but relative preservation of the LV apical function (?reverse Takotsubo). Left ventricular diastolic parameters are consistent with Grade II diastolic dysfunction (pseudonormalization).  2. Right ventricular systolic function is normal. The right ventricular size is normal. There is mildly elevated pulmonary artery systolic pressure. The estimated right ventricular systolic pressure is 43.5 mmHg.  3. Left atrial size was mildly dilated.  4. The mitral valve is normal in structure. Mild mitral valve regurgitation. No evidence of mitral stenosis.  5. The aortic valve is tricuspid. Aortic valve regurgitation is not visualized. No aortic stenosis is present.  6. The inferior vena cava is dilated in size with <50% respiratory variability, suggesting right atrial pressure of 15 mmHg.  7. A small pericardial effusion is present. There is no evidence of cardiac tamponade. FINDINGS  Left Ventricle: Left ventricular ejection fraction, by estimation, is 20 to 25%. The left ventricle has severely decreased function. The left ventricle demonstrates regional wall motion abnormalities. The left ventricular internal cavity size was normal  in size. There is no left ventricular hypertrophy. Left ventricular diastolic parameters are consistent with Grade II diastolic dysfunction (pseudonormalization).  Right Ventricle: The right ventricular size is normal. No increase in right ventricular wall thickness. Right ventricular systolic function is normal. There is mildly elevated pulmonary artery systolic pressure. The tricuspid regurgitant velocity is 2.67  m/s, and with an assumed right atrial pressure of 15 mmHg, the estimated right ventricular systolic pressure is 43.5 mmHg. Left Atrium: Left atrial size was  mildly dilated. Right Atrium: Right atrial size was normal in size. Pericardium: A small pericardial effusion is present. There is no evidence of cardiac tamponade. Mitral Valve: The mitral valve is normal in structure. Mild mitral valve regurgitation. No evidence of mitral valve stenosis. Tricuspid Valve: The tricuspid valve is normal in structure. Tricuspid valve regurgitation is mild. Aortic Valve: The aortic valve is tricuspid. Aortic valve regurgitation is not visualized. No aortic stenosis is present. Pulmonic Valve: The pulmonic valve was normal in structure. Pulmonic valve regurgitation is trivial. Aorta: The aortic root is normal in size and structure. Venous: The inferior vena cava is dilated in size with less than 50% respiratory variability, suggesting right atrial pressure of 15 mmHg. IAS/Shunts: No atrial level shunt detected by color flow Doppler.  LEFT VENTRICLE PLAX 2D LVIDd:         3.20 cm     Diastology LVIDs:         2.80 cm     LV e' medial:    7.72 cm/s LV PW:         1.10 cm     LV E/e' medial:  9.8 LV IVS:        0.80 cm     LV e' lateral:   8.16 cm/s LVOT diam:     1.80 cm     LV E/e' lateral: 9.3 LV SV:         26 LV SV Index:   18 LVOT Area:     2.54 cm  LV Volumes (MOD) LV vol d, MOD A2C: 75.1 ml LV vol d, MOD A4C: 65.7 ml LV vol s, MOD A2C: 57.1 ml LV vol s, MOD A4C: 47.8 ml LV SV MOD A2C:     18.0 ml LV SV MOD A4C:     65.7 ml LV SV MOD BP:      19.0 ml RIGHT VENTRICLE RV S prime:     8.81 cm/s TAPSE (M-mode): 1.2 cm LEFT ATRIUM             Index        RIGHT ATRIUM           Index LA diam:        3.70 cm 2.62 cm/m   RA Area:     6.82 cm LA Vol (A2C):   53.8 ml 38.07 ml/m  RA Volume:   10.90 ml 7.71 ml/m LA Vol (A4C):   43.8 ml 30.99 ml/m LA Biplane Vol: 50.1 ml 35.45 ml/m  AORTIC VALVE LVOT Vmax:   71.80 cm/s LVOT Vmean:  48.300 cm/s LVOT VTI:    0.101 m  AORTA Ao Root diam: 2.80 cm Ao Asc diam:  2.90 cm MITRAL VALVE               TRICUSPID VALVE MV Area (PHT): 6.32 cm    TR Peak grad:   28.5 mmHg MV Decel Time: 120 msec    TR Vmax:        267.00 cm/s MV E velocity: 75.50 cm/s MV A velocity: 50.10 cm/s  SHUNTS MV E/A ratio:  1.51        Systemic VTI:  0.10 m                            Systemic Diam: 1.80 cm Dalton McleanMD Electronically signed by Franki Monte Signature Date/Time: 01/29/2022/3:09:26 PM    Final    DG CHEST PORT 1 VIEW  Result Date: 01/29/2022 CLINICAL DATA:  Orogastric placement EXAM: PORTABLE CHEST 1 VIEW COMPARISON:  Earlier same day FINDINGS: Endotracheal tube tip 1 cm above the carina. Orogastric tube enters the stomach and doubles back with the tip re-entering the distal esophagus. Right internal jugular central line tip in the proximal right atrium. Widespread pulmonary infiltrates/edema as seen previously. IMPRESSION: 1. Orogastric tube enters the stomach and doubles back with the tip re-entering the distal esophagus. 2. Endotracheal tube tip 1 cm above the carina. 3. New central line tip in the proximal right atrium. No pneumothorax. 4. Widespread pulmonary infiltrates/edema persist. Electronically Signed   By: Nelson Chimes M.D.   On: 01/29/2022 09:49   DG Chest Port 1 View  Result Date: 01/29/2022 CLINICAL DATA:  Shortness of breath EXAM: PORTABLE CHEST 1 VIEW COMPARISON:  Previous studies including the examination done earlier today FINDINGS: There is interval placement of endotracheal tube with its tip at the origin of right main bronchus. Cardiac size is within normal limits. There are new patchy infiltrates in both upper lung fields, more  so on the right side. Linear densities are seen in medial left lower lung fields suggesting subsegmental atelectasis. There is no pleural effusion or pneumothorax. There is elevation of minor fissure on the right side suggesting decreased volume in right upper lobe. Increased markings in the apices may suggest scarring. IMPRESSION: Tip of endotracheal tube is noted at the origin of right main bronchus and should be pulled back 3 cm. This finding was relayed to patient's provider by telephone call at 8:26 a.m. on 01/29/2022. There are new patchy infiltrates in both upper lung fields, more so on the right side suggesting atelectasis/pneumonia. Linear densities in medial left lower lung fields suggest subsegmental atelectasis. Electronically Signed   By: Elmer Picker M.D.   On: 01/29/2022 08:28   CT HEAD WO CONTRAST (5MM)  Result Date: 01/29/2022 CLINICAL DATA:  Confusion/delirium EXAM: CT HEAD WITHOUT CONTRAST TECHNIQUE: Contiguous axial images were obtained from the base of the skull through the vertex without intravenous contrast. RADIATION DOSE REDUCTION: This exam was performed according to the departmental dose-optimization program which includes automated exposure control, adjustment of the mA and/or kV according to patient size and/or use of iterative reconstruction technique. COMPARISON:  12/06/2021 FINDINGS: Brain: No evidence of acute infarction, hemorrhage, hydrocephalus, extra-axial collection or mass lesion/mass effect. Mild subcortical white matter and periventricular small vessel ischemic changes. Vascular: No hyperdense vessel or unexpected calcification. Skull: Normal. Negative for fracture or focal lesion. Sinuses/Orbits: The visualized paranasal sinuses are essentially clear. The mastoid air cells are unopacified. Other: None. IMPRESSION: No evidence of acute intracranial abnormality. Mild small vessel ischemic changes. Electronically Signed   By: Julian Hy M.D.   On: 01/29/2022  01:08   CT ABDOMEN PELVIS WO CONTRAST  Result Date: 01/29/2022 CLINICAL DATA:  Sepsis EXAM: CT ABDOMEN AND PELVIS WITHOUT CONTRAST TECHNIQUE: Multidetector CT imaging of the abdomen and pelvis was performed following the standard protocol without IV contrast. RADIATION DOSE REDUCTION: This exam was performed according to the departmental dose-optimization program which includes automated exposure control, adjustment of the mA and/or kV according to patient size and/or use of iterative reconstruction technique. COMPARISON:  12/06/2021 FINDINGS: Motion degraded images. Lower chest: 5 mm right lower lobe pulmonary nodule (series 5/image 2), grossly unchanged when accounting for motion degradation. No follow-up is recommended. Hepatobiliary: Liver is notable for mild hepatic steatosis. Cholelithiasis.  No intrahepatic or extrahepatic duct dilatation. Pancreas: Grossly unremarkable. Spleen: Within normal limits. Adrenals/Urinary Tract:  Adrenal glands are within normal limits. 17 mm nonobstructing left lower pole renal calculus. Kidneys are otherwise grossly unremarkable. No hydronephrosis. Bladder is underdistended but unremarkable. Stomach/Bowel: Stomach is notable for a small hiatal hernia. No evidence of bowel obstruction. Appendix is grossly unremarkable (series 3/image 43). Moderate left colonic stool burden, suggesting constipation. Vascular/Lymphatic: No evidence of abdominal aortic aneurysm. No suspicious abdominopelvic lymphadenopathy. Reproductive: Calcified uterine fibroids. No adnexal masses. Other: No abdominopelvic ascites. Musculoskeletal: Visualized osseous structures are within normal limits. IMPRESSION: Motion degraded images. 17 mm nonobstructing left lower pole renal calculus. No hydronephrosis. Additional stable ancillary findings as above. Electronically Signed   By: Charline Bills M.D.   On: 01/29/2022 01:07   DG Chest Port 1 View  Result Date: 01/29/2022 CLINICAL DATA:  Code sepsis.  EXAM: PORTABLE CHEST 1 VIEW COMPARISON:  December 06, 2021 FINDINGS: The heart size and mediastinal contours are within normal limits. The lungs are hyperinflated with mild biapical atelectasis and pleural thickening. There is no evidence of an acute infiltrate, pleural effusion or pneumothorax. The visualized skeletal structures are unremarkable. IMPRESSION: COPD without acute or active cardiopulmonary disease. Electronically Signed   By: Aram Candela M.D.   On: 01/29/2022 00:46     Subjective: - no chest pain, shortness of breath, no abdominal pain, nausea or vomiting.   Discharge Exam: BP 129/70 (BP Location: Left Leg)   Pulse 92   Temp 97.6 F (36.4 C) (Oral)   Resp 20   Ht 5\' 4"  (1.626 m)   Wt 49.9 kg   SpO2 100%   BMI 18.88 kg/m   General: Pt is alert, awake, not in acute distress Cardiovascular: RRR, S1/S2 +, no rubs, no gallops Respiratory: CTA bilaterally, no wheezing, no rhonchi Abdominal: Soft, NT, ND, bowel sounds + Extremities: no edema, no cyanosis   The results of significant diagnostics from this hospitalization (including imaging, microbiology, ancillary and laboratory) are listed below for reference.     Microbiology: No results found for this or any previous visit (from the past 240 hour(s)).   Labs: Basic Metabolic Panel: Recent Labs  Lab 02/17/22 0330 02/18/22 0517 02/19/22 0535 02/20/22 0330 02/22/22 0509  NA 139 139 136 136 141  K 4.1 3.9 4.0 3.9 3.5  CL 115* 115* 112* 112* 109  CO2 18* 18* 18* 18* 21*  GLUCOSE 119* 121* 120* 108* 85  BUN 31* 27* 27* 26* 22  CREATININE 1.03* 1.07* 0.96 0.99 1.05*  CALCIUM 8.0* 8.3* 8.1* 8.1* 9.8   Liver Function Tests: No results for input(s): "AST", "ALT", "ALKPHOS", "BILITOT", "PROT", "ALBUMIN" in the last 168 hours. CBC: Recent Labs  Lab 02/17/22 1400 02/18/22 0230 02/18/22 0517 02/19/22 0535 02/20/22 0330 02/22/22 0509  WBC 7.4  --  7.1 6.9 7.3 6.1  HGB 7.0* 9.1* 9.0* 9.2* 8.6* 8.7*  HCT  21.5* 27.7* 27.7* 27.2* 25.9* 26.3*  MCV 80.5  --  82.0 81.0 82.2 82.2  PLT 272  --  270 293 296 289   CBG: Recent Labs  Lab 02/19/22 1927 02/19/22 2339 02/20/22 0402 02/20/22 0721 02/20/22 1147  GLUCAP 116* 120* 129* 102* 98   Hgb A1c No results for input(s): "HGBA1C" in the last 72 hours. Lipid Profile No results for input(s): "CHOL", "HDL", "LDLCALC", "TRIG", "CHOLHDL", "LDLDIRECT" in the last 72 hours. Thyroid function studies No results for input(s): "TSH", "T4TOTAL", "T3FREE", "THYROIDAB" in the last 72 hours.  Invalid input(s): "FREET3" Urinalysis    Component Value Date/Time   COLORURINE YELLOW 01/29/2022 0152  APPEARANCEUR CLEAR 01/29/2022 0152   LABSPEC 1.020 01/29/2022 [place] 5.5 01/29/2022 0152   GLUCOSEU NEGATIVE 01/29/2022 0152   HGBUR LARGE (A) 01/29/2022 0152   BILIRUBINUR NEGATIVE 01/29/2022 0152   KETONESUR 15 (A) 01/29/2022 0152   PROTEINUR 100 (A) 01/29/2022 0152   UROBILINOGEN 0.2 04/24/2007 1344   NITRITE NEGATIVE 01/29/2022 0152   LEUKOCYTESUR MODERATE (A) 01/29/2022 0152    FURTHER DISCHARGE INSTRUCTIONS:   Get Medicines reviewed and adjusted: Please take all your medications with you for your next visit with your Primary MD   Laboratory/radiological data: Please request your Primary MD to go over all hospital tests and procedure/radiological results at the follow up, please ask your Primary MD to get all Hospital records sent to his/her office.   In some cases, they will be blood work, cultures and biopsy results pending at the time of your discharge. Please request that your primary care M.D. goes through all the records of your hospital data and follows up on these results.   Also Note the following: If you experience worsening of your admission symptoms, develop shortness of breath, life threatening emergency, suicidal or homicidal thoughts you must seek medical attention immediately by calling 911 or calling your MD immediately   if symptoms less severe.   You must read complete instructions/literature along with all the possible adverse reactions/side effects for all the Medicines you take and that have been prescribed to you. Take any new Medicines after you have completely understood and accpet all the possible adverse reactions/side effects.    Do not drive when taking Pain medications or sleeping medications (Benzodaizepines)   Do not take more than prescribed Pain, Sleep and Anxiety Medications. It is not advisable to combine anxiety,sleep and pain medications without talking with your primary care practitioner   Special Instructions: If you have smoked or chewed Tobacco  in the last 2 yrs please stop smoking, stop any regular Alcohol  and or any Recreational drug use.   Wear Seat belts while driving.   Please note: You were cared for by a hospitalist during your hospital stay. Once you are discharged, your primary care physician will handle any further medical issues. Please note that NO REFILLS for any discharge medications will be authorized once you are discharged, as it is imperative that you return to your primary care physician (or establish a relationship with a primary care physician if you do not have one) for your post hospital discharge needs so that they can reassess your need for medications and monitor your lab values.  Time coordinating discharge: 40 minutes  SIGNED:  Marzetta Board, MD, PhD 02/23/2022, 8:36 AM

## 2022-02-24 ENCOUNTER — Other Ambulatory Visit (HOSPITAL_COMMUNITY): Payer: Self-pay

## 2022-02-26 ENCOUNTER — Other Ambulatory Visit (HOSPITAL_COMMUNITY): Payer: Self-pay

## 2022-02-26 MED ORDER — OXYCODONE HCL 5 MG PO TABS
2.5000 mg | ORAL_TABLET | ORAL | 0 refills | Status: DC | PRN
  Filled 2022-02-26: qty 84, 28d supply, fill #0

## 2022-02-27 ENCOUNTER — Other Ambulatory Visit (HOSPITAL_COMMUNITY): Payer: Self-pay

## 2022-02-27 ENCOUNTER — Other Ambulatory Visit: Payer: Self-pay

## 2022-02-27 MED ORDER — OXYCODONE HCL 5 MG PO TABS
2.5000 mg | ORAL_TABLET | ORAL | 0 refills | Status: DC | PRN
  Filled 2022-02-27: qty 45, 15d supply, fill #0

## 2022-03-05 ENCOUNTER — Other Ambulatory Visit (HOSPITAL_COMMUNITY): Payer: Self-pay

## 2022-03-05 MED ORDER — SENNOSIDES 8.6 MG PO TABS
1.0000 | ORAL_TABLET | Freq: Two times a day (BID) | ORAL | 2 refills | Status: DC | PRN
  Filled 2022-03-05: qty 60, 30d supply, fill #0

## 2022-03-06 ENCOUNTER — Encounter: Payer: Self-pay | Admitting: Internal Medicine

## 2022-03-12 ENCOUNTER — Other Ambulatory Visit: Payer: Self-pay

## 2022-04-05 ENCOUNTER — Encounter: Payer: Self-pay | Admitting: Internal Medicine

## 2022-06-25 DEATH — deceased

## 2023-11-23 IMAGING — DX DG CHEST 1V PORT
1 series · 1 of 1 positions shown · non-contrast
Comparison: 03/14/2021

CLINICAL DATA: Shortness of breath

EXAM:
PORTABLE CHEST 1 VIEW

[chest ap]
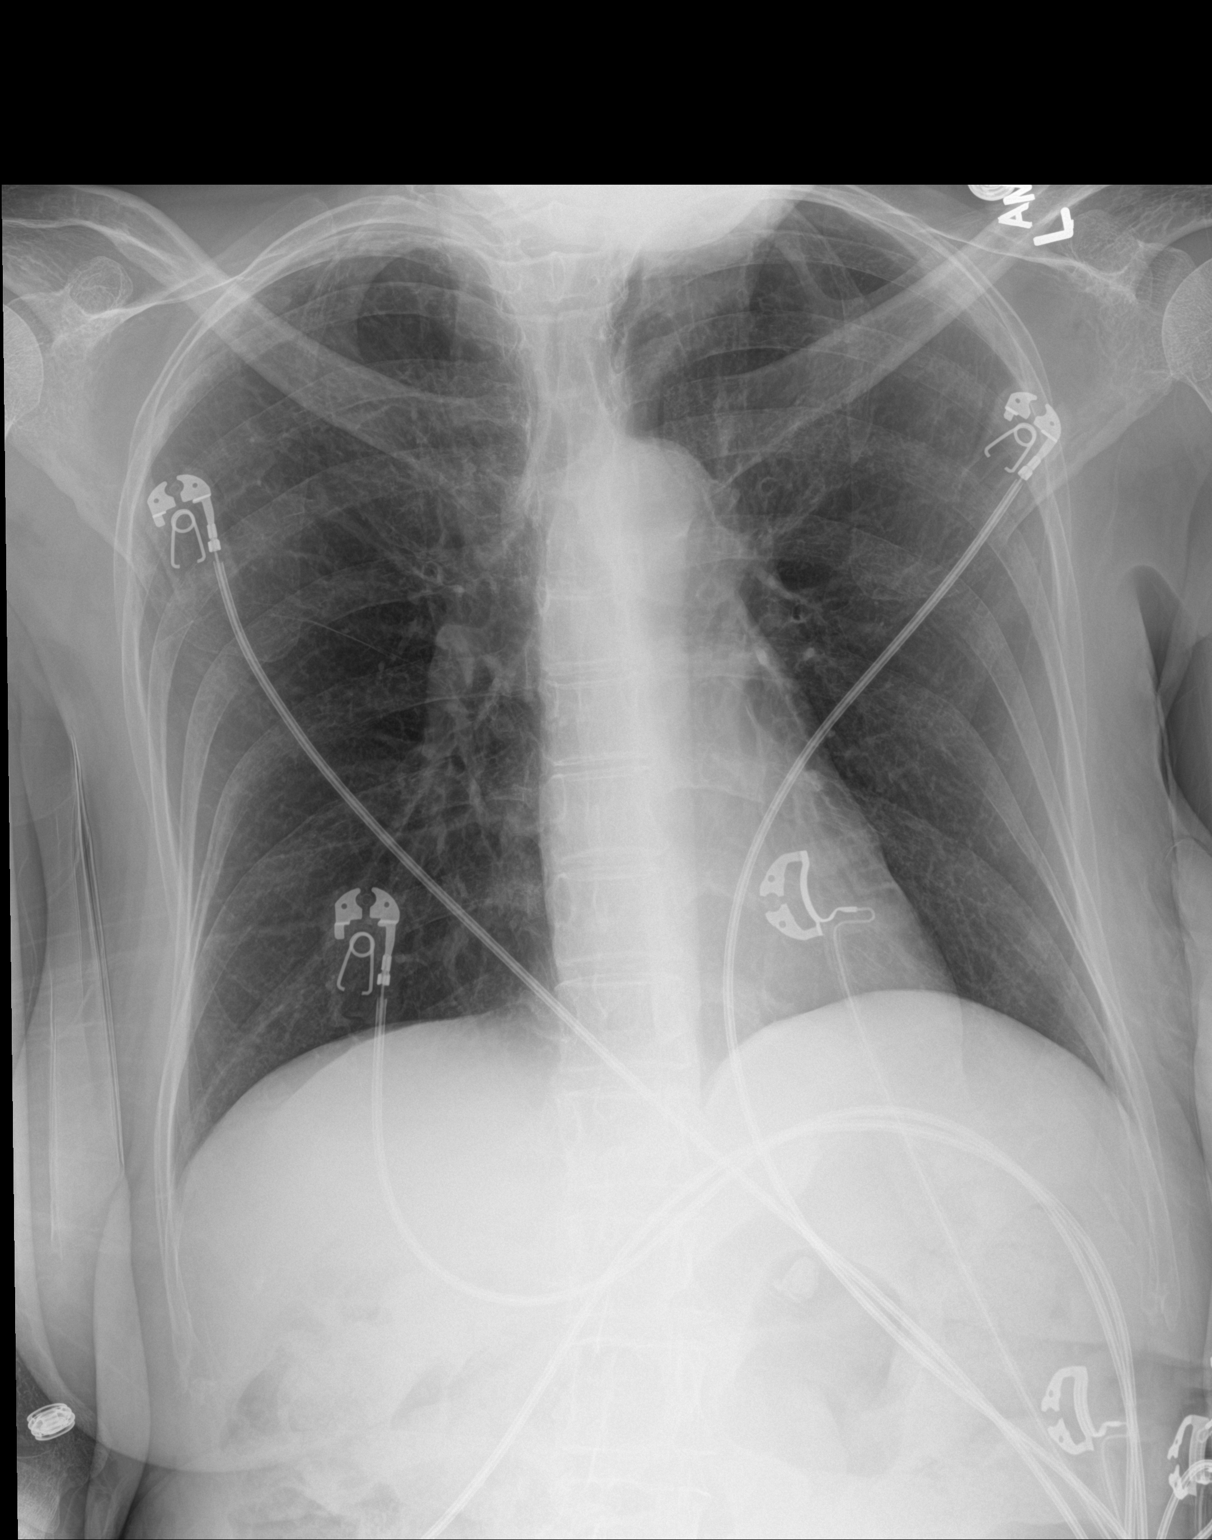

[1 of 1 positions shown; findings below may reference images not displayed]

FINDINGS: Cardiac size is within normal limits. There are no signs of
pulmonary edema or focal pulmonary consolidation. There is no
pleural effusion or pneumothorax. Low position of diaphragms
suggests possible COPD.
IMPRESSION: There are no signs of pulmonary edema or focal pulmonary
consolidation.

## 2023-11-23 IMAGING — CT CT HEAD W/O CM
3 series · 14 of 47 positions shown, 16 images · non-contrast
Comparison: No priors.

CLINICAL DATA: 60-year-old female with history of trauma from a
fall during a syncopal event. Head and neck pain.



[Series 1: head wo · axial · 0.42mm/px · z∈[-168,-42]mm · 8 of 30 slices shown, 10 images]
[im 3/30  brain]
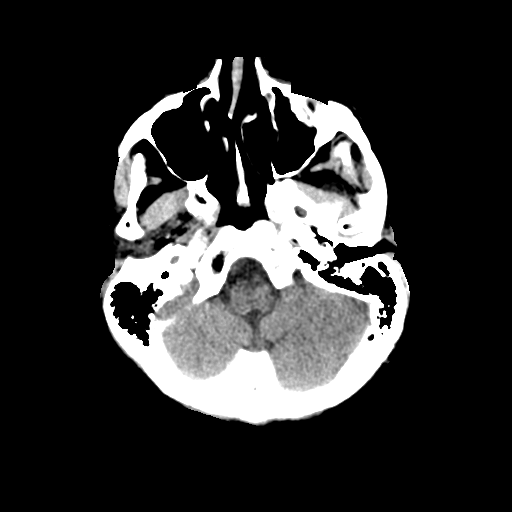
[im 3/30  bone]
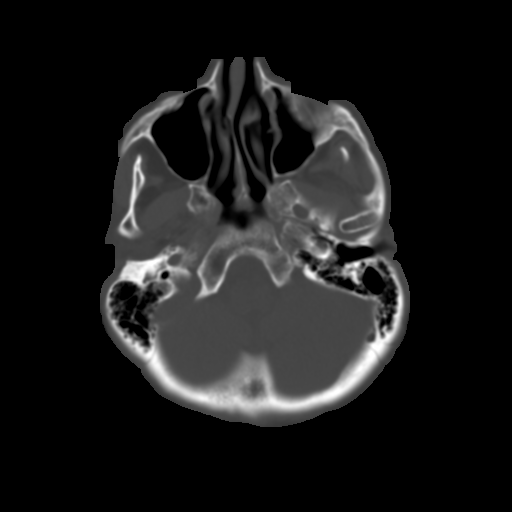
[im 7/30  brain]
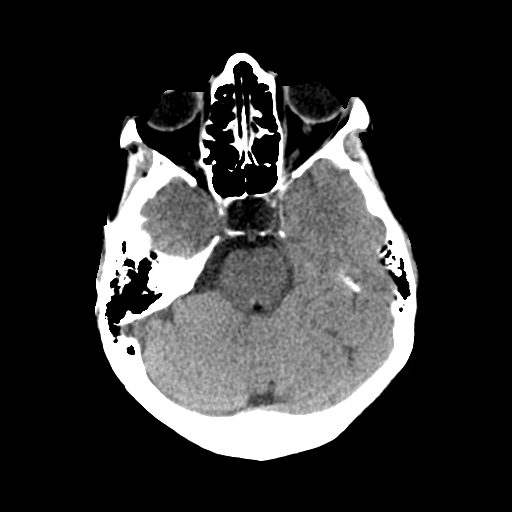
[im 10/30  brain]
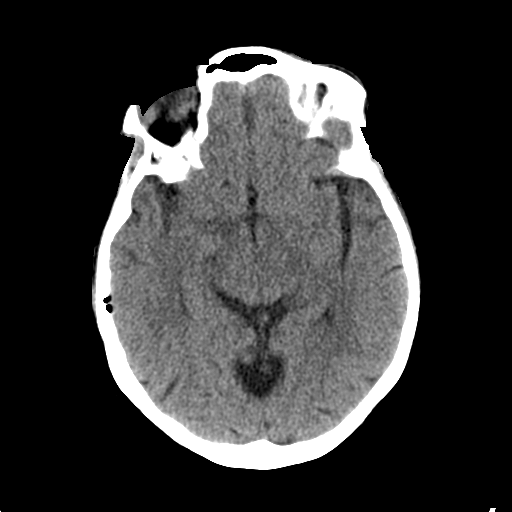
[im 14/30  brain]
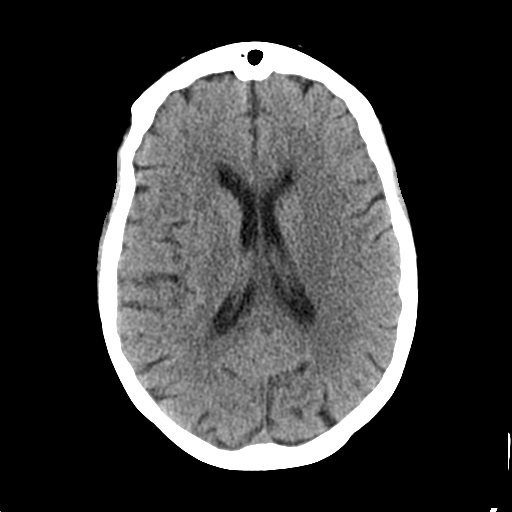
[im 17/30  brain]
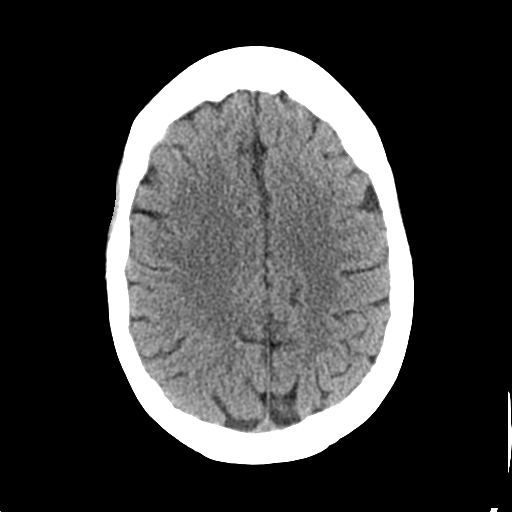
[im 17/30  bone]
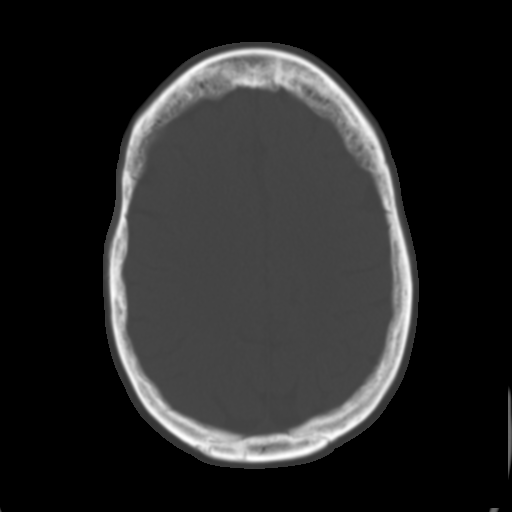
[im 21/30  brain]
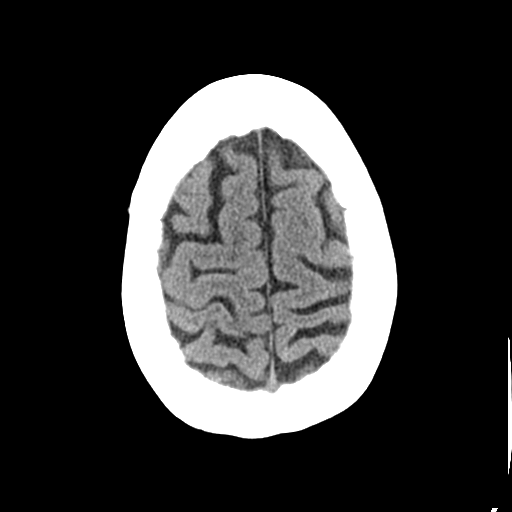
[im 24/30  brain]
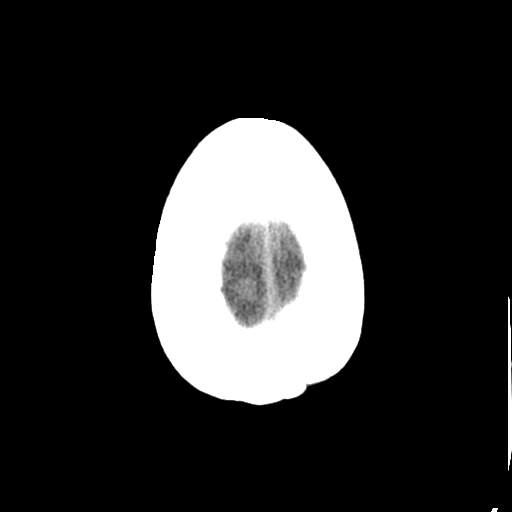
[im 28/30  brain]
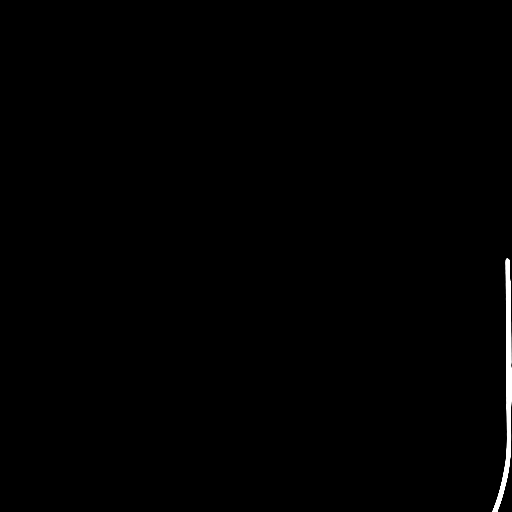

[Series 5: coronal soft tissue · coronal · 0.28mm/px · 3 of 68 slices shown]
[im 23/68  brain]
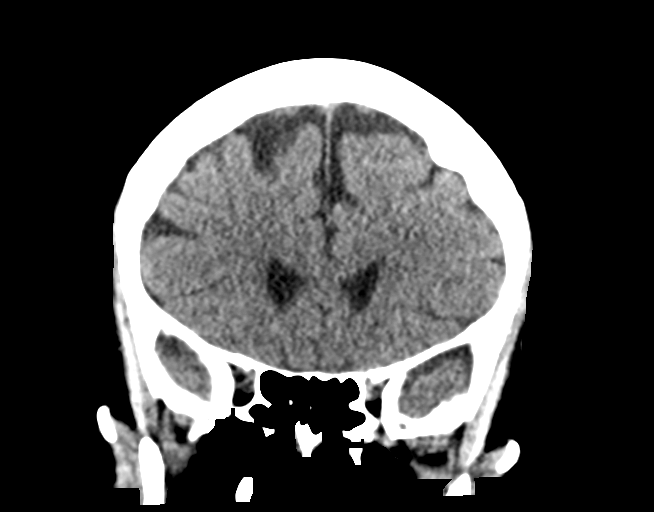
[im 30/68  brain]
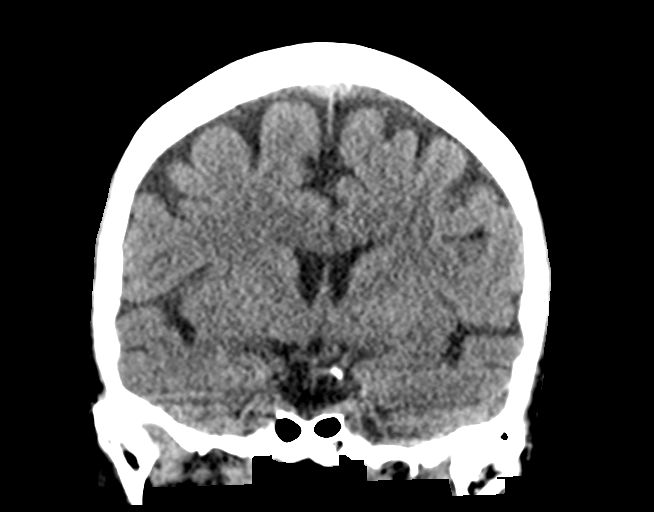
[im 38/68  brain]
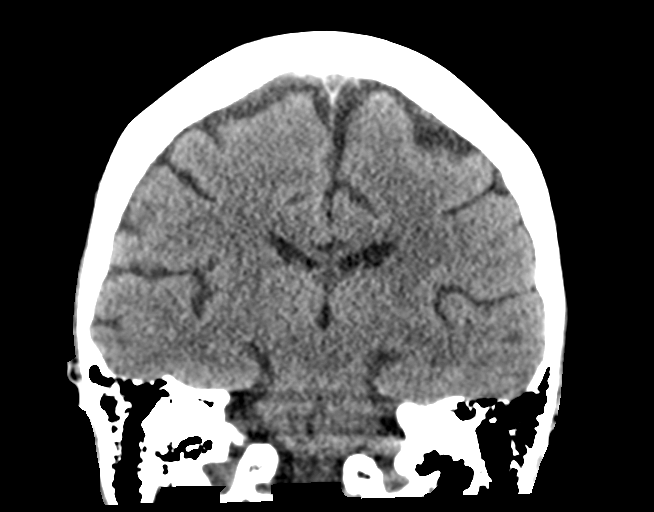

[Series 6: sagittal soft tissue · sagittal · 0.28mm/px · 3 of 57 slices shown]
[im 19/57  brain]
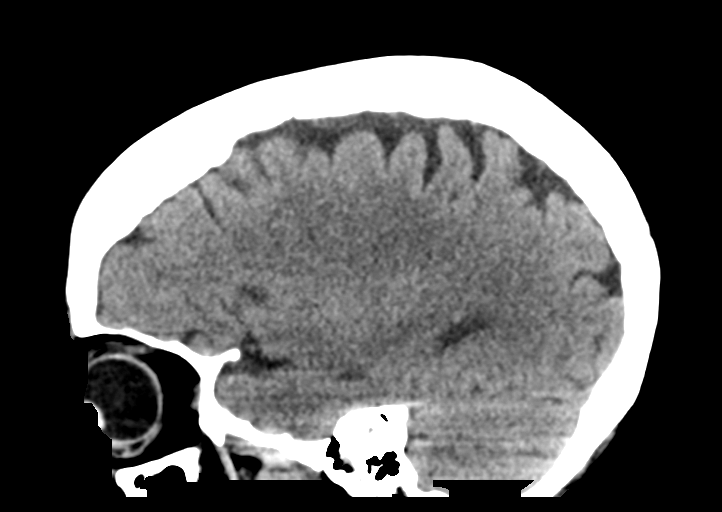
[im 29/57  brain]
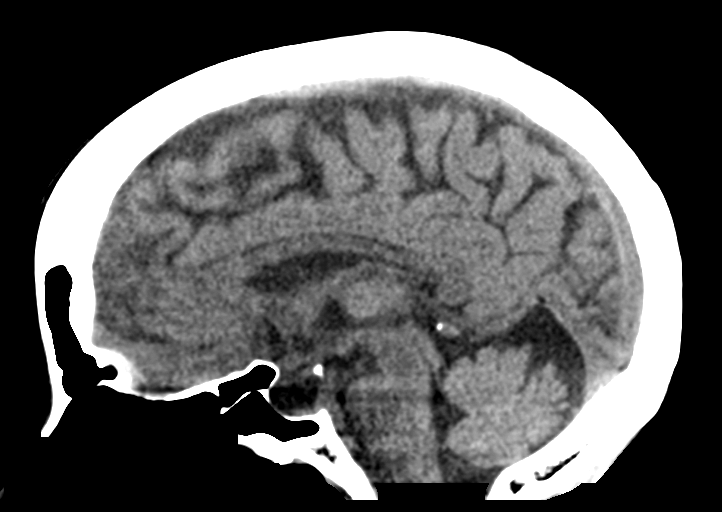
[im 38/57  brain]
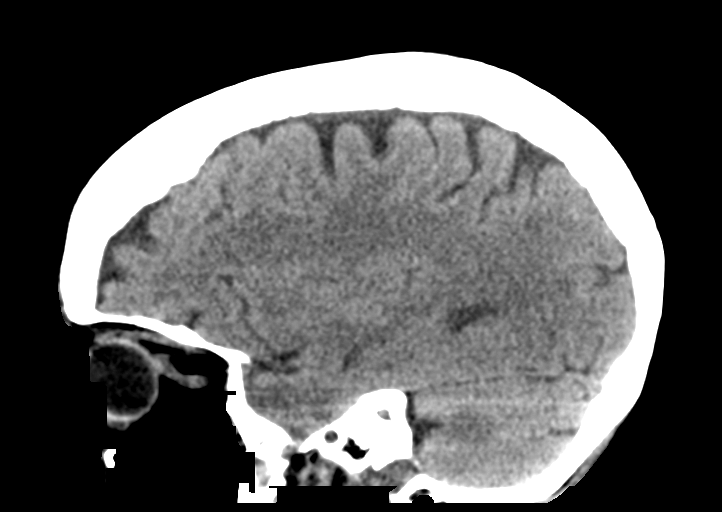

[14 of 47 positions shown; findings below may reference images not displayed]

FINDINGS: CT HEAD FINDINGS

Brain: Physiologic calcifications are noted in the basal ganglia
bilaterally. No evidence of acute infarction, hemorrhage,
hydrocephalus, extra-axial collection or mass lesion/mass effect.

Vascular: No hyperdense vessel or unexpected calcification.

Skull: Normal. Negative for fracture or focal lesion.

Sinuses/Orbits: No acute finding.

Other: None.

CT CERVICAL SPINE FINDINGS

Alignment: Normal.

Skull base and vertebrae: No acute fracture. No primary bone lesion
or focal pathologic process.

Soft tissues and spinal canal: No prevertebral fluid or swelling. No
visible canal hematoma.

Disc levels: Multilevel degenerative disc disease, most pronounced
at C5-C6. Mild multilevel facet arthropathy.

Upper chest: Pleuroparenchymal scarring in the apices of both lungs
(right greater than left).

Other: None.
IMPRESSION: 1. No evidence of significant acute traumatic injury to the skull,
brain or cervical spine. The appearance of the brain is normal.
2. Mild multilevel degenerative disc disease and cervical
spondylosis, as above.
